# Patient Record
Sex: Female | Born: 1980 | Race: Black or African American | Hispanic: No | Smoking: Never smoker
Health system: Southern US, Community
[De-identification: ages and names within clinical notes are randomized; demographics above are authoritative.]

## PROBLEM LIST (undated history)

## (undated) DIAGNOSIS — E109 Type 1 diabetes mellitus without complications: Secondary | ICD-10-CM

## (undated) DIAGNOSIS — E7800 Pure hypercholesterolemia, unspecified: Secondary | ICD-10-CM

## (undated) DIAGNOSIS — I82409 Acute embolism and thrombosis of unspecified deep veins of unspecified lower extremity: Secondary | ICD-10-CM

## (undated) DIAGNOSIS — E039 Hypothyroidism, unspecified: Secondary | ICD-10-CM

## (undated) DIAGNOSIS — E0500 Thyrotoxicosis with diffuse goiter without thyrotoxic crisis or storm: Secondary | ICD-10-CM

## (undated) DIAGNOSIS — H547 Unspecified visual loss: Secondary | ICD-10-CM

## (undated) DIAGNOSIS — Z992 Dependence on renal dialysis: Secondary | ICD-10-CM

## (undated) DIAGNOSIS — E10319 Type 1 diabetes mellitus with unspecified diabetic retinopathy without macular edema: Secondary | ICD-10-CM

## (undated) DIAGNOSIS — N186 End stage renal disease: Secondary | ICD-10-CM

## (undated) DIAGNOSIS — I10 Essential (primary) hypertension: Secondary | ICD-10-CM

## (undated) DIAGNOSIS — R011 Cardiac murmur, unspecified: Secondary | ICD-10-CM

## (undated) HISTORY — PX: ENUCLEATION: SHX628

---

## 2002-10-30 ENCOUNTER — Inpatient Hospital Stay (HOSPITAL_COMMUNITY): Admission: EM | Admit: 2002-10-30 | Discharge: 2002-11-01 | Payer: Self-pay | Admitting: *Deleted

## 2002-10-30 ENCOUNTER — Encounter: Payer: Self-pay | Admitting: Internal Medicine

## 2003-05-22 ENCOUNTER — Emergency Department (HOSPITAL_COMMUNITY): Admission: EM | Admit: 2003-05-22 | Discharge: 2003-05-22 | Payer: Self-pay | Admitting: Emergency Medicine

## 2003-06-18 ENCOUNTER — Encounter: Payer: Self-pay | Admitting: Emergency Medicine

## 2003-06-18 ENCOUNTER — Emergency Department (HOSPITAL_COMMUNITY): Admission: AD | Admit: 2003-06-18 | Discharge: 2003-06-18 | Payer: Self-pay | Admitting: Emergency Medicine

## 2003-07-07 ENCOUNTER — Inpatient Hospital Stay (HOSPITAL_COMMUNITY): Admission: EM | Admit: 2003-07-07 | Discharge: 2003-07-08 | Payer: Self-pay | Admitting: Emergency Medicine

## 2003-08-04 ENCOUNTER — Inpatient Hospital Stay (HOSPITAL_COMMUNITY): Admission: EM | Admit: 2003-08-04 | Discharge: 2003-08-06 | Payer: Self-pay | Admitting: Emergency Medicine

## 2004-04-09 ENCOUNTER — Inpatient Hospital Stay (HOSPITAL_COMMUNITY): Admission: EM | Admit: 2004-04-09 | Discharge: 2004-04-11 | Payer: Self-pay | Admitting: Emergency Medicine

## 2006-01-27 ENCOUNTER — Inpatient Hospital Stay (HOSPITAL_COMMUNITY): Admission: EM | Admit: 2006-01-27 | Discharge: 2006-01-30 | Payer: Self-pay | Admitting: Emergency Medicine

## 2007-10-07 HISTORY — PX: CATARACT EXTRACTION W/ INTRAOCULAR LENS IMPLANT: SHX1309

## 2007-10-07 HISTORY — PX: EYE SURGERY: SHX253

## 2010-08-19 ENCOUNTER — Encounter (HOSPITAL_COMMUNITY)
Admission: RE | Admit: 2010-08-19 | Discharge: 2010-11-05 | Payer: Self-pay | Source: Home / Self Care | Attending: Internal Medicine | Admitting: Internal Medicine

## 2010-12-17 LAB — HCG, SERUM, QUALITATIVE: Preg, Serum: NEGATIVE

## 2011-02-21 NOTE — Discharge Summary (Signed)
[name], [name]               ACCOUNT NO.:  [id number]   MEDICAL RECORD NO.:  [id number]          PATIENT TYPE:  INP   LOCATION:  6707                         FACILITY:  [facility]   PHYSICIAN:  Lonia Blood, M.D.      DATE OF BIRTH:  1981/07/21   DATE OF ADMISSION:  01/26/2006  DATE OF DISCHARGE:  01/30/2006                                 DISCHARGE SUMMARY   DISCHARGE DIAGNOSES:  1.  Diabetic ketoacidosis.  2.  Nausea and vomiting.  3.  Constipation.  4.  Hypokalemia.   DISCHARGE MEDICATIONS:  1.  NPH insulin 15 units subcutaneously at breakfast, dinner and at bedtime.  2.  Sliding scale insulin using regular insulin at mealtimes.   DISPOSITION:  The patient is being discharged.  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  She has no local doctor, but would be going home  next week where she will resume care with her primary care doctor.  The  patient is aware of the need for her to check her sugars, blood sugar at  mealtimes.  She will take her NPH insulin as well as sliding scale until she  sees her doctor.  She has been instructed to call our office if her sugars  are persistently more than 200 by this regimen.   CONSULTATIONS:  None.   BRIEF HISTORY AND PHYSICAL:  Please refer to the dictated History and  Physical by Dr. Garnette Czech.  The patient is a 30 year old female, well known to  our service with recurrent admission with DKA.  On the day of admission, she  came in with nausea and vomiting.  She also has some constipation.  The  patient has been diabetic since 1996.  As indicated, she was being followed  at [facility], but being a student, she has been here all the while.  Her  blood sugar was 16109 here.  She was acidotic.  Her bicarbonate was also low  with a pH of 7.01.  She was subsequently admitted for management of her  diabetic ketoacidosis.   HOSPITAL COURSE:  #1 - DIABETIC KETOACIDOSIS.  The patient was admitted and  started on IV insulin.  She  had initial hyperkalemia which also resulted in  hypokalemia.  Potassium was repleted as well.  Her B-met was checked q.2 h  and CBG q.1 h until her gap closed.  Her initial gap on admission was 16,  but closed within the first 12 hours.  Since then, she has been converted to  subcutaneous insulin, initially Lantus, but not transitioned back to her  home medications.  Her CBG's are still high despite being on home  medication, indicating that that was not controlling her sugar adequately.  Hence, we made adjustments increasing the dose of the NPH to 15 units from  12 units.  She will use a sliding scale as well.  She needs to reestablish  contact and care with her primary care physician for further management.   #2 - HYPOKALEMIA.  This is most likely secondary to the insulin.  Also, the  patient had  nausea and vomiting which may have contributed.  Her potassium  repleted prior to her discharge.  __________, however, seemed to be stable  at this point, as the patient is healthy otherwise.  Her nausea and vomiting  has also resolved.      Lonia Blood, M.D.  Electronically Signed     LG/MEDQ  D:  01/30/2006  T:  01/30/2006  Job:  [id number]

## 2011-02-21 NOTE — Discharge Summary (Signed)
NAMEMarland [name]  GULIANNA, [name]                         ACCOUNT NO.:  [id number]   MEDICAL RECORD NO.:  [id number]                   PATIENT TYPE:  INP   LOCATION:  3305                                 FACILITY:  [facility]   PHYSICIAN:  Catalina Pizza, M.D.                     DATE OF BIRTH:  [birth year]   DATE OF ADMISSION:  10/30/2002  DATE OF DISCHARGE:  11/01/2002                                 DISCHARGE SUMMARY   DIFFERENTIAL DIAGNOSIS:  Diabetic ketoacidosis.   DISCHARGE MEDICATIONS:  1. Lantus 45 units q.h.s.  2. Regular insulin on sliding scale each meal as previously done.  3. Record and check blood sugar levels at each meal.   HISTORY OF PRESENT ILLNESS:  The patient is a 30 year old African American  female with insulin dependent diabetes who was in town for the weekend and  forgot her insulin at home.  The patient reports last dose of insulin two  days prior to the emergency room visit.  The patient tolerated eating  yesterday and she had one episode of nausea and vomiting today and  complained of feeling short of breath.  Last insulin was on January 23 in  the evening, had a normal appetite.  No nausea, vomiting the day prior to  admission.  Denies having any recent illnesses, no dysuria, no fever, no  chills.   ALLERGIES:  No known drug allergies.   PAST MEDICAL HISTORY:  Insulin dependent diabetes diagnosed in 1996, last  admitted to hospital for DKA four years ago.  Prior to that multiple visits  for DKA with blood sugars in the 400 to 800 range.   MEDICATIONS:  Medications prior to admission:  1. Lantus 45 units q.h.s.  2. Lispro sliding scale approximately 12 units each meal.   HABITS:  Does not smoke, no alcohol, no cocaine or IV drug use.   SOCIAL HISTORY:  She is a single, [place] American female who commutes to  [employer].  [place].  Lives with her mother and stepfather.   PHYSICAL EXAMINATION:  On admission vital  signs temperature 98.4, pulse 109,  blood pressure 140/72, respirations 20, 97% on room air.  Physical exam was  benign.   LABORATORY DATA:  Laboratory obtained during admission:  sodium 130,  potassium 5.4, chloride 108, bicarbonate 9, BUN 20, her glucose of 428.  Obtained ABG initially which was 7.156, PT 24.7 then repeated which showed a  pH of 7.07, a PCO2 of 16.9 and a PO2 of 138.0 on room air.  Initial white  blood cell count was 20.5, hemoglobin was 14.5, hematocrit 42.8, MCV 88.5  and platelets 300.  AST of 28,  ALT of 30, alkaline phosphatase 130, T-bili  1.5, magnesium 1.9, phosphorous 4.6, lactic acid was 1.7, serum osmols were  306, had a very small amount of acetone in blood.  Hemoglobin  A1c was 12.9  and TSH of 0.915.  Urine pregnancy was negative.  Acetaminophen salicylates  were both negative.  Drug screen was negative.  A urinalysis was negative,  nitrite negative, leukocyte esterase rare bacteria, greater than 80 ketones  and greater than 1,000 glucose.  A chest x-ray showed no acute disease.   HOSPITAL COURSE:  DIABETIC KETOACIDOSIS.  During hospitalization patient was  initially stabilized with large amounts of fluid, admitted to step down unit  at which time her sodium and bicarbonate collected quickly with continued  monitoring of her potassium which remained stable.  She was initially  started on insulin drip and then switched to sliding scale once the anion  gap had closed.  She was then switched from a sliding scale, given a low  dose of NPH and then switched to Lantus evening dose as she was at home.  She did have some mild hypokalemia which was repleted with two doses of K-  Dur.  The patient apparently was visiting friends in [place] and had  forgotten her insulin and since she has not had any problems with DKA in the  recent past she did not worry with it.  Questioned on some noncompliance  with her medicines and minimal follow up given her hemoglobin A1c  of 12.9.  Talked extensively with the patient about need for continuing with her  insulin as well as need to follow up routinely every three months for her  blood sugars and further medical treatment needed.   DISPOSITION:  She is to avoid sugar altogether, return immediately to the  emergency department if she has any further nausea or vomiting or call the  outpatient clinic at [phone].  She will need a follow up at [facility] on February 5 at 3:45 in the [facility] clinic.  Emphasized to  the patient the need for close follow up and continued monitoring of her  blood sugars.  Patient agreed to the above appointment and was aware of the  need for close monitoring of her diabetes given the fact that she is a very  brittle diabetic.                                                  Catalina Pizza, M.D.    ZH/MEDQ  D:  12/14/2002  T:  12/15/2002  Job:  [id number]

## 2011-02-21 NOTE — Discharge Summary (Signed)
NAMEMarland [name]  SHABREE, TEBBETTS                         ACCOUNT NO.:  [id number]   MEDICAL RECORD NO.:  [id number]                   PATIENT TYPE:  INP   LOCATION:  3308                                 FACILITY:  [facility]   PHYSICIAN:  Lawerance Sabal, MD                   DATE OF BIRTH:  [birth year]   DATE OF ADMISSION:  07/06/2003  DATE OF DISCHARGE:  07/08/2003                                 DISCHARGE SUMMARY   DISCHARGE DIAGNOSES:  1. Diabetic ketoacidosis, resolved.  2. Diabetes mellitus, type 1.  3. Hypokalemia, resolved.   DISCHARGE MEDICATIONS:  1. Lantus 40 units at night.  2. Lispro sliding scale with each meal.   SPECIAL INSTRUCTIONS:  Record all blood sugars at each meal and record  Lispro given at each meal.  Bring recorded values with you to follow-up  appointment.   FOLLOW UP:  Follow up in two weeks at outpatient clinic.  Follow up with  nutritionist.   HISTORY OF PRESENT ILLNESS:  This is a 30 year old, African-American female  with known type 1 diabetes mellitus with a two-day history of malaise,  weakness, nausea and headache.  The patient is on a rapid insulin  administration at home and she has not been checking her sugars regularly.  Present insulin regimen Lantus 40 units h.s. and Lispro 10 minutes before  meals.   ALLERGIES:  No known drug allergies.   PAST MEDICAL HISTORY:  1. Type 1 diabetes mellitus diagnosed in 1996.  2. Previous DKA episodes with last admission in January 2004.   FAMILY HISTORY:  Unremarkable.   SOCIAL HISTORY:  The patient is a Consulting civil engineer at [employer]  She is single  and lives with mom and step-father.   REVIEW OF SYMPTOMS:  MUSCULOSKELETAL:  Positive for fatigue, joint pain,  muscle and back pain.  CARDIOPULMONARY:  Positive dyspnea.   PHYSICAL EXAMINATION:  GENERAL:  Awake, not in distress.  VITAL SIGNS:  Blood pressure 115-118/55-65, heart rate 78-95, respirations  15-20, temperature 97.6.  HEENT:  Pink conjunctivae.   Anicteric sclerae.  PERRLA.  No tonsillar  oropharyngeal congestion.  No cervical lymphadenopathy.  CARDIAC:  Regular rate and rhythm, no murmurs.  LUNGS:  Clear to auscultation bilaterally.  No rales.  No wheezing.  ABDOMEN:  Normal bowel sounds.  No tenderness and no masses.  EXTREMITIES:  Warm.  No lesions.  NEUROLOGIC:  Cranial nerves 2-12 grossly intact.  Motor 5/5.  Sensory  intact.   LABORATORY DATA AND X-RAY FINDINGS:  Hemoglobin 16.8, WBC 11.2, platelet  count 375.  Sodium 129, potassium 6.9, chloride 110, CO2 5.0, BUN 14,  creatinine 0.4, glucose 439.  ABG with pH 7.14, pCO2 14.9, bicarb 5.0.  Repeat ABG showed pH 7.209, pCO2 31.4, pO2 116, bicarb 13.  Blood culture  negative.  Urine culture negative.   ADMISSION DIAGNOSES:  1. Diabetic ketoacidosis.  2. Diabetes mellitus, type 1.  3. Hypokalemia.   HOSPITAL COURSE:  The patient was started on insulin drip at six units per  hour.  CBG monitoring was done every one hour.  Two ampules of bicarb was  also given.  Correction of the hypokalemia was done.  On hospital day #2,  insulin drip was discontinued.  The patient was started on NPH 15 units once  a day and Lantus 40 per unit at bedtime.  The patient was observed for two  days with note of good control of the blood sugars.   CONDITION ON DISCHARGE:  The patient was discharged in stable condition and  will follow up after two weeks at the outpatient clinic.                                                     Lawerance Sabal, MD    MC/MEDQ  D:  07/12/2003  T:  07/13/2003  Job:  [id number]

## 2011-02-21 NOTE — Discharge Summary (Signed)
NAMEMarland Kitchen  [name], [name]                         ACCOUNT NO.:  [id number]   MEDICAL RECORD NO.:  [id number]                   PATIENT TYPE:  INP   LOCATION:  5725                                 FACILITY:  [facility]   PHYSICIAN:  Alvester Morin, M.D.               DATE OF BIRTH:  07/06/1981   DATE OF ADMISSION:  04/09/2004  DATE OF DISCHARGE:  04/11/2004                                 DISCHARGE SUMMARY   DISCHARGE DIAGNOSES:  1. Diabetic ketoacidosis, resolved.  2. Hypokalemia, resolved.  3. Diabetes mellitus type 1.   DISCHARGE MEDICATIONS:  1. Lantus 30 units at bedtime.  2. Insulin Novolog sliding scale, insulin-moderate sensitive, with each meal     three times a day.   CONDITION ON DISCHARGE:  The patient was discharged in excellent condition.  At time of discharge she was able to ambulate without problems, to feed  herself without any problems, and she had no nausea, no vomiting, her CBGs  were under well control, and her DKA was completely resolved.  She was  instructed to follow up with [facility] on April 30, 2004,  at 2 p.m. with Lonia Blood, M.D.   No procedures, no consultations were obtained or done during this admission.   BRIEF ADMITTING HISTORY AND PHYSICAL WITH ADMISSION LABS:  [name] is a  30 year old African-American woman with type 1 diabetes mellitus, who  presented to [facility] emergency room on April 09, 2004, for lethargy,  general malaise, nausea and vomiting.  Her CBGs have been running high for  the past days, and she has not been using her sliding scale insulin.  On  admission she had a benign neurologic exam, a regular heart and clear lungs,  but she had some mild abdominal tenderness, more so on the right upper  quadrant.   On admission her bicarbonate was 8, her sodium was 131, her glucose was 436.  Her total bilirubin was 1.9, alkaline phosphatase 129.  Also, on admission  she had small acetone in the blood and she had white  blood cell count of  12.4 thousand.   HOSPITAL COURSE:  Problem 1.  DIABETIC KETOACIDOSIS:  This is one of the numerous admissions  for [name] for diabetic ketoacidosis.  Initial differential diagnosis in  terms of the cause for diabetic ketoacidosis was noncompliance versus the  possibility of gallbladder process.  The patient was started on aggressive  intravenous fluid hydration with normal saline, then switched to dextrose  and half normal saline.  She was placed on an intravenous insulin drip,  initially at 5 units an hour and then decreased to 4 and 3 units an hour.  Sixteen hours into admission her anion gap closed and she was transitioned  to subcutaneous insulin as she was started on a diabetic diet.  At the time  of discharge her bicarbonate was 22 and her anion gap was 3.  Problem 2.  DIABETES MELLITUS TYPE 1:  While in the hospital there were  numerous discussions with [name] about her optimal regimen.  She  discussed with the diabetic educator the options, and she was going to  pursue Lantus 30 units at bedtime together with mealtime coverage three  times a day for an adequate rapid-acting insulin intake.  At the same time  she had a long discussion with the case manager in terms of how to buy the  supplies that she needs.  For long-term follow-up, she is going to follow up  at [facility] for her  diabetes and continuous check on hemoglobin A1C every six months will be  provided there.  Last set of blood work:  Sodium 141, potassium 3.9,  chloride 116, bicarbonate 22, glucose 108, BUN 7, creatinine 0.7, calcium  8.8.  Total bilirubin 0.7, alkaline phosphatase of 78.  The abdominal  ultrasound was within normal limits.      Lonia Blood, M.D.                          Alvester Morin, M.D.    SL/MEDQ  D:  04/11/2004  T:  04/12/2004  Job:  [id number]   cc:   [facility]

## 2011-02-21 NOTE — H&P (Signed)
NAMEMarland [name]  CHARLY, HUNTON                         ACCOUNT NO.:  [id number]   MEDICAL RECORD NO.:  [id number]                   PATIENT TYPE:  INP   LOCATION:  2104                                 FACILITY:  [facility]   PHYSICIAN:  Melissa L. Ladona Ridgel, MD               DATE OF BIRTH:  12-30-1980   DATE OF ADMISSION:  08/04/2003  DATE OF DISCHARGE:                                HISTORY & PHYSICAL   CHIEF COMPLAINT:  Nausea and vomiting.   HISTORY OF PRESENT ILLNESS:  This is a 30 year old African American female  with insulin-dependent diabetes, who states that she developed nausea and  vomiting after eating pork in the cafeteria at her school.  She denies any  diarrhea but states that she had multiple episodes of nausea and vomiting  and was unable to keep anything down.  This morning, she states that she  felt febrile and came into the emergency room because she thought she was  running into trouble with DKA.   PAST MEDICAL HISTORY:  Past medical history is significant for insulin-  dependent diabetes mellitus.  Her last DKA episode was at the beginning of  the month; she states it was secondary to stress.   PAST SURGICAL HISTORY:  None.   SOCIAL HISTORY:  She denies tobacco and ethanol use.   FAMILY HISTORY:  Noncontributory.   ALLERGIES:  She has no known drug allergies.   MEDICATIONS AT HOME:  Lantus 42 units nightly q.h.s sliding-scale insulin  with meals.   PHYSICAL EXAMINATION:  VITAL SIGNS:  In the emergency room, her vital signs  are 96.9 for a temperature, blood pressure 155/56, pulse is 106, respiratory  rate is 18 and 100% on room air.  GENERAL:  Generally, she is sleepy but arousable.  HEENT:  Her pupils are equal, round and reactive to light.  Her extraocular  movements are intact.  Her mucous membranes are dry.  NECK:  Her neck is supple and there is no JVD, no bruits, no thyromegaly.  CHEST:  Chest is clear to auscultation.  There are no rhonchi, rales or  wheezes.  CARDIOVASCULAR:  She is tachycardic with positive S1 and S2, no S3 or S4.  There is a 1-2/6 systolic ejection murmur at the left sternal border.  ABDOMEN:  Her abdomen is soft, nontender and nondistended with positive  bowel sounds.  There is no rebound or guarding.  EXTREMITIES:  Her extremities reveal a left groin triple-lumen catheter, 2+  pulses in all extremities, no rashes, no edema.  NEUROLOGIC:  Cranial nerves II-XII are intact.  Power is 5/5.  Her deep  tendon reflexes are 2+.  Her plantars are downgoing.   LABORATORY DATA:  Her labs in the emergency room revealed a white count of  21.9 with a hemoglobin of 18 and hematocrit of 54.  Her platelets are  439,000.  Her first BMET showed a sodium of 126,  potassium 6.3, chloride of  109, bicarb of 7, BUN of 27, creatinine of 0.6 and glucose of 634.  Repeat  BMET reveals improvement in her potassium, status post IV fluids and  insulin, at 5.2, BUN of 22 and a creatinine of 1.5.  Initially, she had an  ABG which revealed a pH of 7.07, PCO2 of 10.5, her PO2 was 125 and her  bicarb was 3.  After IV hydration and insulin, her pH was slightly improved  to 7.127 with a PCO2 of 16.5, PO2 of 160 and a bicarb of 5.  Her urine  pregnancy is negative; her UA is also negative.   EKG showed normal sinus rhythm with peaked T waves, no ST-T wave changes.   There was no x-ray completed in the emergency room.   ASSESSMENT AND PLAN:  This is a 30 year old African American female in  diabetic ketoacidosis possibly secondary to food intake of questionable  nature at her college.  Aspiration pneumonia is also a possibility secondary  to nausea and vomiting to explain the white count.   1. Diabetic ketoacidosis:  The patient has currently completed her third     liter of normal saline at 500 mL per hour and her fourth liter has been     hung.  Repeat ABG shows mild improvement, however, her bicarb remains     critical.  At this time, we will  add an ampule of bicarbonate to her     current intravenous fluids, decrease her rate to 250 mL per hour.  We     will continue with the Glucommander and we will add D-5 normal saline     when the fasting blood sugar is less than 250.  If the gap remains     closed, she will be started on long-acting insulin and she will be     permitted to eat; however, at this time, she appears far from that point.  2. Increased white blood cells with report of food indiscretion:  I would     like to start Cipro 400 mg IV q.12 h. to cover abdominal source, however,     the more likely explanation for her diabetic ketoacidosis is gastritis     with nausea and vomiting.  We will go ahead and check a chest x-ray     before sending her to the floor to ascertain whether there are any     elements of aspiration related to her nausea and vomiting.  3. Gap metabolic acidosis:  We will be adding one ampule of bicarbonate to     the current intravenous fluids times one liter.  Her potassium will be     repleted when it reaches less than 5.0 and a repeat EKG will be obtained.  4. For deep venous thrombosis prophylaxis at this time, we will use     pneumatic antiembolic stockings/hose and gastrointestinal prophylaxis     will be held off on at this point in this young woman.  Phenergan as     p.r.n. can be used for nausea and vomiting.                                                Melissa L. Ladona Ridgel, MD    MLT/MEDQ  D:  08/04/2003  T:  08/04/2003  Job:  [id number]

## 2011-02-21 NOTE — H&P (Signed)
[name], [name]               ACCOUNT NO.:  [id number]   MEDICAL RECORD NO.:  [id number]          PATIENT TYPE:  EMS   LOCATION:  [facility]                         FACILITY:  [facility]   PHYSICIAN:  Hettie Holstein, D.O.    DATE OF BIRTH:  1981/06/17   DATE OF ADMISSION:  01/26/2006  DATE OF DISCHARGE:                                HISTORY & PHYSICAL   PRIMARY CARE PHYSICIAN:  Unassigned.   CHIEF COMPLAINT:  Elevated CBG.   HISTORY OF PRESENT ILLNESS:  [name] is a 30 year old pleasant female  with known history of type 1 diabetes diagnosed in 51. She had been in her  usual state of health up until the past few days where she has experienced  nausea, vomiting and had some complaints of constipation and reported some  elevated high blood sugars. She states that she had been taking her insulin.  She takes NPH three times daily and uses insulin sliding scale. In any event  in the emergency department, she is found to be acidotic with a low  bicarbonate and blood sugar greater than 700 and tachypneic, though  hemodynamically stable. She is being admitted for diabetic ketoacidosis.   PAST MEDICAL HISTORY:  As noted above. Significant for insulin-requiring  diabetes. She had formerly seen [facility], though has not  maintained continuity with a [facility]ards for quite some time.  She denies any previous surgical procedures.   MEDICATIONS:  1.  She takes NPH 12 units t.i.d. in the a.m., at 4:30 p.m. and at 11:30      p.m.  2.  NovoLog sliding scale.  3.  She has been formerly on Lantus which she stated before she found      herself giving herself more insulin than usual since that time.   ALLERGIES:  She has no known drug allergies.   SOCIAL HISTORY:  She is a Teacher, music at [employer] She  has no children. She is not married. She denies illicit drug use. She is  homosexual. Her parent's phone number is [phone].   FAMILY HISTORY:   Noncontributory.   REVIEW OF SYSTEMS:  Dark stools, also complains of constipation. Had some  blood-tinged emesis after four or five episodes of vomiting today.  Otherwise, she has no other complaints.   PHYSICAL EXAMINATION:  She is tachycardic in the department, though this is  improved with one liter of IV fluid. She continues to be tachypneic. Neck is  supple and nontender. Cardiovascular exam reveals normal S1 and S2. Lungs  are clear. Abdomen is minimally tender. Extremities reveal no edema.   LABORATORY DATA:  Only I-stat was performed in the emergency department  which revealed a creatinine of 0.9 and a sodium of 128, potassium 5.2  (corrected sodium was 136), BUN 21. Her hemoglobin was 17.0. Her pH was  7.01, pCO2 27. We will obtain further labs at this time.   ASSESSMENT:  1.  Diabetic ketoacidosis.  2.  Nausea and vomiting.  3.  Constipation.  4.  History of dark stools, possible gastrointestinal bleed.   PLAN:  We  are going to admit [name] to a stepdown ICU. Administer  generous IV fluids and IV insulin therapy as well as glucose until anion gap  closure. She will be placed on Glucomander until this occurs. We will follow  clinical course and resume long-acting insulin in the a.m. and discontinue  d5 subsequently thereafter. We will hemoccult and Gastroccult her stools and  emesis and follow her clinical course.      Hettie Holstein, D.O.  Electronically Signed     ESS/MEDQ  D:  01/27/2006  T:  01/27/2006  Job:  [id number]

## 2013-03-30 ENCOUNTER — Inpatient Hospital Stay (HOSPITAL_COMMUNITY): Payer: BC Managed Care – PPO

## 2013-03-30 ENCOUNTER — Emergency Department (HOSPITAL_COMMUNITY): Payer: BC Managed Care – PPO

## 2013-03-30 ENCOUNTER — Inpatient Hospital Stay (HOSPITAL_COMMUNITY)
Admission: EM | Admit: 2013-03-30 | Discharge: 2013-04-09 | DRG: 315 | Disposition: A | Discharge status: Home / Self Care | Payer: BC Managed Care – PPO | Attending: Family Medicine | Admitting: Family Medicine

## 2013-03-30 ENCOUNTER — Encounter (HOSPITAL_COMMUNITY): Payer: Self-pay | Admitting: Emergency Medicine

## 2013-03-30 DIAGNOSIS — Z9001 Acquired absence of eye: Secondary | ICD-10-CM | POA: Diagnosis not present

## 2013-03-30 DIAGNOSIS — E1039 Type 1 diabetes mellitus with other diabetic ophthalmic complication: Secondary | ICD-10-CM | POA: Diagnosis present

## 2013-03-30 DIAGNOSIS — E8779 Other fluid overload: Secondary | ICD-10-CM | POA: Diagnosis present

## 2013-03-30 DIAGNOSIS — E11319 Type 2 diabetes mellitus with unspecified diabetic retinopathy without macular edema: Secondary | ICD-10-CM | POA: Diagnosis present

## 2013-03-30 DIAGNOSIS — E1029 Type 1 diabetes mellitus with other diabetic kidney complication: Secondary | ICD-10-CM | POA: Diagnosis present

## 2013-03-30 DIAGNOSIS — E860 Dehydration: Secondary | ICD-10-CM | POA: Diagnosis present

## 2013-03-30 DIAGNOSIS — I82629 Acute embolism and thrombosis of deep veins of unspecified upper extremity: Secondary | ICD-10-CM | POA: Diagnosis not present

## 2013-03-30 DIAGNOSIS — I160 Hypertensive urgency: Secondary | ICD-10-CM

## 2013-03-30 DIAGNOSIS — E039 Hypothyroidism, unspecified: Secondary | ICD-10-CM | POA: Diagnosis present

## 2013-03-30 DIAGNOSIS — N2581 Secondary hyperparathyroidism of renal origin: Secondary | ICD-10-CM | POA: Diagnosis present

## 2013-03-30 DIAGNOSIS — E876 Hypokalemia: Secondary | ICD-10-CM | POA: Diagnosis present

## 2013-03-30 DIAGNOSIS — N186 End stage renal disease: Secondary | ICD-10-CM | POA: Diagnosis present

## 2013-03-30 DIAGNOSIS — K5900 Constipation, unspecified: Secondary | ICD-10-CM | POA: Diagnosis present

## 2013-03-30 DIAGNOSIS — D649 Anemia, unspecified: Secondary | ICD-10-CM | POA: Diagnosis present

## 2013-03-30 DIAGNOSIS — H9190 Unspecified hearing loss, unspecified ear: Secondary | ICD-10-CM | POA: Diagnosis present

## 2013-03-30 DIAGNOSIS — H540X55 Blindness right eye category 5, blindness left eye category 5: Secondary | ICD-10-CM | POA: Diagnosis present

## 2013-03-30 DIAGNOSIS — I120 Hypertensive chronic kidney disease with stage 5 chronic kidney disease or end stage renal disease: Secondary | ICD-10-CM | POA: Diagnosis present

## 2013-03-30 DIAGNOSIS — N19 Unspecified kidney failure: Secondary | ICD-10-CM

## 2013-03-30 DIAGNOSIS — E872 Acidosis, unspecified: Secondary | ICD-10-CM | POA: Diagnosis present

## 2013-03-30 DIAGNOSIS — I10 Essential (primary) hypertension: Secondary | ICD-10-CM

## 2013-03-30 DIAGNOSIS — I82623 Acute embolism and thrombosis of deep veins of upper extremity, bilateral: Secondary | ICD-10-CM

## 2013-03-30 DIAGNOSIS — H547 Unspecified visual loss: Secondary | ICD-10-CM

## 2013-03-30 DIAGNOSIS — E109 Type 1 diabetes mellitus without complications: Secondary | ICD-10-CM | POA: Diagnosis present

## 2013-03-30 HISTORY — DX: Unspecified visual loss: H54.7

## 2013-03-30 HISTORY — DX: Hypothyroidism, unspecified: E03.9

## 2013-03-30 LAB — GLUCOSE, CAPILLARY
Glucose-Capillary: 201 mg/dL — ABNORMAL HIGH (ref 70–99)
Glucose-Capillary: 291 mg/dL — ABNORMAL HIGH (ref 70–99)

## 2013-03-30 LAB — URINALYSIS, ROUTINE W REFLEX MICROSCOPIC
Bilirubin Urine: NEGATIVE
Glucose, UA: 250 mg/dL — AB
Ketones, ur: 15 mg/dL — AB
Leukocytes, UA: NEGATIVE
Nitrite: NEGATIVE
Protein, ur: >300 mg/dL — AB
Specific Gravity, Urine: 1.017 (ref 1.005–1.030)
Urobilinogen, UA: 0.2 mg/dL (ref 0.0–1.0)
pH: 6.5 (ref 5.0–8.0)

## 2013-03-30 LAB — COMPREHENSIVE METABOLIC PANEL
ALT: 20 U/L (ref 0–35)
AST: 20 U/L (ref 0–37)
Albumin: 1.8 g/dL — ABNORMAL LOW (ref 3.5–5.2)
Alkaline Phosphatase: 61 U/L (ref 39–117)
BUN: 53 mg/dL — ABNORMAL HIGH (ref 6–23)
CO2: 16 mEq/L — ABNORMAL LOW (ref 19–32)
Calcium: 8.9 mg/dL (ref 8.4–10.5)
Chloride: 97 mEq/L (ref 96–112)
Creatinine, Ser: 6.87 mg/dL — ABNORMAL HIGH (ref 0.50–1.10)
GFR calc Af Amer: 9 mL/min — ABNORMAL LOW (ref >90)
GFR calc non Af Amer: 7 mL/min — ABNORMAL LOW (ref >90)
Glucose, Bld: 203 mg/dL — ABNORMAL HIGH (ref 70–99)
Potassium: 2.9 mEq/L — ABNORMAL LOW (ref 3.5–5.1)
Sodium: 130 mEq/L — ABNORMAL LOW (ref 135–145)
Total Bilirubin: 0.3 mg/dL (ref 0.3–1.2)
Total Protein: 6.4 g/dL (ref 6.0–8.3)

## 2013-03-30 LAB — POCT I-STAT 3, VENOUS BLOOD GAS (G3P V)
Acid-base deficit: 4 mmol/L — ABNORMAL HIGH (ref 0.0–2.0)
Bicarbonate: 23.4 mEq/L (ref 20.0–24.0)
O2 Saturation: 36 %
TCO2: 25 mmol/L (ref 0–100)
pCO2, Ven: 50.8 mmHg — ABNORMAL HIGH (ref 45.0–50.0)
pH, Ven: 7.272 (ref 7.250–7.300)
pO2, Ven: 24 mmHg — CL (ref 30.0–45.0)

## 2013-03-30 LAB — URINE MICROSCOPIC-ADD ON

## 2013-03-30 LAB — CBC WITH DIFFERENTIAL/PLATELET
Basophils Absolute: 0.1 10*3/uL (ref 0.0–0.1)
Basophils Relative: 1 % (ref 0–1)
Eosinophils Absolute: 0 10*3/uL (ref 0.0–0.7)
Eosinophils Relative: 0 % (ref 0–5)
HCT: 31.5 % — ABNORMAL LOW (ref 36.0–46.0)
Hemoglobin: 11.4 g/dL — ABNORMAL LOW (ref 12.0–15.0)
Lymphocytes Relative: 23 % (ref 12–46)
Lymphs Abs: 1.4 10*3/uL (ref 0.7–4.0)
MCH: 29.7 pg (ref 26.0–34.0)
MCHC: 36.2 g/dL — ABNORMAL HIGH (ref 30.0–36.0)
MCV: 82 fL (ref 78.0–100.0)
Monocytes Absolute: 0.2 10*3/uL (ref 0.1–1.0)
Monocytes Relative: 4 % (ref 3–12)
Neutro Abs: 4.4 10*3/uL (ref 1.7–7.7)
Neutrophils Relative %: 72 % (ref 43–77)
Platelets: 353 10*3/uL (ref 150–400)
RBC: 3.84 MIL/uL — ABNORMAL LOW (ref 3.87–5.11)
RDW: 12.3 % (ref 11.5–15.5)
WBC: 6 10*3/uL (ref 4.0–10.5)

## 2013-03-30 LAB — KETONES, QUALITATIVE: Acetone, Bld: NEGATIVE

## 2013-03-30 LAB — LIPASE, BLOOD: Lipase: 104 U/L — ABNORMAL HIGH (ref 11–59)

## 2013-03-30 LAB — PREGNANCY, URINE: Preg Test, Ur: NEGATIVE

## 2013-03-30 LAB — LACTIC ACID, PLASMA: Lactic Acid, Venous: 1.3 mmol/L (ref 0.5–2.2)

## 2013-03-30 MED ORDER — ONDANSETRON HCL 4 MG/2ML IJ SOLN
4.0000 mg | Freq: Three times a day (TID) | INTRAMUSCULAR | Status: DC | PRN
  Administered 2013-03-31: 4 mg via INTRAVENOUS
  Filled 2013-03-30: qty 2

## 2013-03-30 MED ORDER — POLYETHYLENE GLYCOL 3350 17 G PO PACK
17.0000 g | PACK | Freq: Two times a day (BID) | ORAL | Status: DC
  Administered 2013-03-30 – 2013-03-31 (×3): 17 g via ORAL
  Filled 2013-03-30 (×5): qty 1

## 2013-03-30 MED ORDER — SODIUM CHLORIDE 0.9 % IV SOLN: INTRAVENOUS | Status: DC

## 2013-03-30 MED ORDER — SODIUM CHLORIDE 0.9 % IV BOLUS (SEPSIS)
1000.0000 mL | Freq: Once | INTRAVENOUS | Status: AC
  Administered 2013-03-30: 1000 mL via INTRAVENOUS

## 2013-03-30 MED ORDER — INSULIN GLARGINE 100 UNIT/ML ~~LOC~~ SOLN
11.0000 [IU] | Freq: Every day | SUBCUTANEOUS | Status: DC
  Administered 2013-03-30 – 2013-04-02 (×4): 11 [IU] via SUBCUTANEOUS
  Filled 2013-03-30 (×5): qty 0.11

## 2013-03-30 MED ORDER — SODIUM CHLORIDE 0.9 % IJ SOLN
3.0000 mL | Freq: Two times a day (BID) | INTRAMUSCULAR | Status: DC
  Administered 2013-03-30 – 2013-04-08 (×16): 3 mL via INTRAVENOUS

## 2013-03-30 MED ORDER — POTASSIUM CHLORIDE CRYS ER 20 MEQ PO TBCR
40.0000 meq | EXTENDED_RELEASE_TABLET | Freq: Once | ORAL | Status: AC
  Administered 2013-03-30: 40 meq via ORAL
  Filled 2013-03-30: qty 2

## 2013-03-30 MED ORDER — ONDANSETRON HCL 4 MG/2ML IJ SOLN
4.0000 mg | Freq: Once | INTRAMUSCULAR | Status: AC
  Administered 2013-03-30: 4 mg via INTRAVENOUS
  Filled 2013-03-30: qty 2

## 2013-03-30 MED ORDER — INSULIN ASPART 100 UNIT/ML ~~LOC~~ SOLN
0.0000 [IU] | Freq: Three times a day (TID) | SUBCUTANEOUS | Status: DC
  Administered 2013-03-31 (×2): 5 [IU] via SUBCUTANEOUS
  Administered 2013-03-31: 3 [IU] via SUBCUTANEOUS
  Administered 2013-04-01: 8 [IU] via SUBCUTANEOUS
  Administered 2013-04-02: 5 [IU] via SUBCUTANEOUS
  Administered 2013-04-02: 2 [IU] via SUBCUTANEOUS
  Administered 2013-04-02: 8 [IU] via SUBCUTANEOUS
  Administered 2013-04-03: 5 [IU] via SUBCUTANEOUS
  Administered 2013-04-03: 11 [IU] via SUBCUTANEOUS
  Administered 2013-04-05: 3 [IU] via SUBCUTANEOUS
  Administered 2013-04-05: 8 [IU] via SUBCUTANEOUS
  Administered 2013-04-05: 3 [IU] via SUBCUTANEOUS
  Administered 2013-04-06: 15 [IU] via SUBCUTANEOUS
  Administered 2013-04-06: 11 [IU] via SUBCUTANEOUS
  Administered 2013-04-07: 2 [IU] via SUBCUTANEOUS
  Administered 2013-04-07: 5 [IU] via SUBCUTANEOUS
  Administered 2013-04-08 – 2013-04-09 (×2): 11 [IU] via SUBCUTANEOUS

## 2013-03-30 MED ORDER — ONDANSETRON HCL 4 MG/2ML IJ SOLN: 4.0000 mg | Freq: Three times a day (TID) | INTRAMUSCULAR | Status: DC | PRN

## 2013-03-30 MED ORDER — HEPARIN SODIUM (PORCINE) 5000 UNIT/ML IJ SOLN
5000.0000 [IU] | Freq: Three times a day (TID) | INTRAMUSCULAR | Status: DC
  Administered 2013-03-30 – 2013-04-02 (×7): 5000 [IU] via SUBCUTANEOUS
  Filled 2013-03-30 (×11): qty 1

## 2013-03-30 MED ORDER — STERILE WATER FOR INJECTION IV SOLN
INTRAVENOUS | Status: DC
  Administered 2013-03-30: 14:00:00 via INTRAVENOUS
  Filled 2013-03-30 (×2): qty 850

## 2013-03-30 MED ORDER — HYDRALAZINE HCL 20 MG/ML IJ SOLN
10.0000 mg | INTRAMUSCULAR | Status: DC | PRN
  Administered 2013-03-30 – 2013-03-31 (×2): 10 mg via INTRAVENOUS
  Filled 2013-03-30 (×2): qty 1

## 2013-03-30 NOTE — ED Provider Notes (Signed)
History    CSN: [id number] Arrival date & time 03/30/13  1037  First MD Initiated Contact with Patient 03/30/13 1130     Chief Complaint  Patient presents with  . Abnormal Lab  . Nausea   (Consider location/radiation/quality/duration/timing/severity/associated sxs/prior Treatment) HPI Comments: Patient presents with nausea and vomiting that started last night. She threw up one time this morning is nonbilious and nonbloody. She endorses constipation has not moved her bowels in 2 days. She was seen by her PCP last week for a cough and treated with antibiotics for URI. She states she was called and told that her kidney function was elevated. She states her sugars have been in the 200 range. She denies any fevers, chest pain or shortness of breath. She denies any abdominal pain. She has had some intermittent low back pain without dysuria or hematuria.  The history is provided by the patient.   Past Medical History  Diagnosis Date  . Diabetes mellitus without complication   . Blind    History reviewed. No pertinent past surgical history. History reviewed. No pertinent family history. History  Substance Use Topics  . Smoking status: Never Smoker   . Smokeless tobacco: Not on file  . Alcohol Use: No   OB History   Grav Para Term Preterm Abortions TAB SAB Ect Mult Living                 Review of Systems  Constitutional: Positive for activity change, appetite change and fatigue. Negative for fever.  HENT: Negative for congestion and rhinorrhea.   Respiratory: Positive for cough. Negative for chest tightness and shortness of breath.   Cardiovascular: Negative for chest pain.  Gastrointestinal: Positive for nausea, vomiting and constipation. Negative for abdominal pain.  Genitourinary: Negative for dysuria, hematuria, vaginal bleeding and vaginal discharge.  Musculoskeletal: Positive for back pain.  Skin: Negative for rash.  Neurological: Positive for weakness. Negative for  dizziness, light-headedness and headaches.   A complete 10 system review of systems was obtained and all systems are negative except as noted in the HPI and PMH.   Allergies  Review of patient's allergies indicates no known allergies.  Home Medications   Current Outpatient Rx  Name  Route  Sig  Dispense  Refill  . azithromycin (ZITHROMAX) 250 MG tablet   Oral   Take 250-500 mg by mouth See admin instructions. Take two tablet by mouth today, then take 1 tablet daily for 4 days         . benzonatate (TESSALON) 100 MG capsule   Oral   Take 100-200 mg by mouth every 8 (eight) hours as needed for cough.         . fish oil-omega-3 fatty acids 1000 MG capsule   Oral   Take 1 g by mouth daily.         . Garlic 500 MG CAPS   Oral   Take 1 capsule by mouth daily.         . insulin aspart (NOVOLOG) 100 UNIT/ML injection   Subcutaneous   Inject 5-10 Units into the skin See admin instructions. Titration of additional 1 unit per every 50 spike in blood sugar level max of 200         . insulin glargine (LANTUS) 100 UNIT/ML injection   Subcutaneous   Inject 25 Units into the skin 2 (two) times daily.         Marland Kitchen lisinopril-hydrochlorothiazide (PRINZIDE,ZESTORETIC) 10-12.5 MG per tablet   Oral  Take 1 tablet by mouth daily.         . Multiple Vitamin (MULTI-VITAMIN DAILY PO)   Oral   Take 1 tablet by mouth daily.          BP 179/105  Pulse 78  Temp(Src) 98.3 F (36.8 C) (Oral)  Resp 18  SpO2 100%  LMP 03/23/2013 Physical Exam  Constitutional: She is oriented to person, place, and time. She appears well-developed and well-nourished. No distress.  Appears tachypneic Which patient states is because she is nauseated denies shortness of breath  HENT:  Head: Normocephalic and atraumatic.  Mouth/Throat: No oropharyngeal exudate.  Dry mucous membranes  Eyes: Conjunctivae and EOM are normal. Pupils are equal, round, and reactive to light.  Neck: Normal range of motion.  Neck supple.  Cardiovascular: Normal rate, regular rhythm and normal heart sounds.   Pulmonary/Chest: Effort normal and breath sounds normal. No respiratory distress.  Abdominal: Soft. There is no tenderness. There is no rebound and no guarding.  Musculoskeletal: Normal range of motion. She exhibits tenderness. She exhibits no edema.  Paraspinal lumbar pain, no CVA tenderness  Neurological: She is alert and oriented to person, place, and time. No cranial nerve deficit. She exhibits normal muscle tone. Coordination normal.  Skin: Skin is warm.    ED Course  Procedures (including critical care time) Labs Reviewed  CBC WITH DIFFERENTIAL - Abnormal; Notable for the following:    RBC 3.84 (*)    Hemoglobin 11.4 (*)    HCT 31.5 (*)    MCHC 36.2 (*)    All other components within normal limits  COMPREHENSIVE METABOLIC PANEL - Abnormal; Notable for the following:    Sodium 130 (*)    Potassium 2.9 (*)    CO2 16 (*)    Glucose, Bld 203 (*)    BUN 53 (*)    Creatinine, Ser 6.87 (*)    Albumin 1.8 (*)    GFR calc non Af Amer 7 (*)    GFR calc Af Amer 9 (*)    All other components within normal limits  LIPASE, BLOOD - Abnormal; Notable for the following:    Lipase 104 (*)    All other components within normal limits  GLUCOSE, CAPILLARY - Abnormal; Notable for the following:    Glucose-Capillary 201 (*)    All other components within normal limits  POCT I-STAT 3, BLOOD GAS (G3P V) - Abnormal; Notable for the following:    pCO2, Ven 50.8 (*)    pO2, Ven 24.0 (*)    Acid-base deficit 4.0 (*)    All other components within normal limits  KETONES, QUALITATIVE  LACTIC ACID, PLASMA  PREGNANCY, URINE  URINALYSIS, ROUTINE W REFLEX MICROSCOPIC   Dg Abd Acute W/chest  03/30/2013   *RADIOLOGY REPORT*  Clinical Data: Hypertension, bronchitis, low back pressure with nausea and vomiting.  ACUTE ABDOMEN SERIES (ABDOMEN 2 VIEW & CHEST 1 VIEW)  Comparison: None.  Findings: Frontal view of the chest  shows midline trachea and normal heart size.  Lungs are clear.  No pleural fluid.  Two views of the abdomen show a fair amount of stool in the colon. No small bowel dilatation.  IMPRESSION: Bowel gas pattern suggests at least mild constipation.   Original Report Authenticated By: Leanna Battles, M.D.   1. Renal failure   2. Metabolic acidosis   3. Hypertensive urgency     MDM  Diabetic with nausea and vomiting and decreased appetite. Told by PCP kidney function was elevated.  No fevers. Recently treated for URI with some kind of antibiotic.  Creatinine 6.87, BUN 53, bicarbonate 16. Anion gap is 17. Metabolic acidosis like it is likely secondary to renal failure. No evidence of DKA. IV fluids continued. Will start bicarbonate drip. Lipase mildly elevated at 102. No significant abdominal pain.  Discussed with family practice who will admit for unassigned [facility]. Discussed with Dr. Lowell Guitar nephrology who will consult.   Glynn Octave, MD 03/30/13 2111

## 2013-03-30 NOTE — ED Notes (Signed)
Pt c/o abnormal lab with elevated kidney levels from PCP; pt sts some increased N/V; pt is blind

## 2013-03-30 NOTE — Consult Note (Signed)
I was asked by Dr. Manus Gunning to see [name] who is a 32 y.o. female juvenile diabetic of about two decades with a history of CKD with sCreat of 1.4mg /dl in January of 1610.  She presented to Dr. Concepcion Elk for her routine eval on 6/17 and found to have sCreat of 6.36mg /dl.  She does report some le edema and recent nausea.  She has a long history of hypertension.  She is blind.  Past Medical History  Diagnosis Date  . Diabetes mellitus without complication   . Blind    History reviewed. No pertinent past surgical history. Social History:  reports that she has never smoked. She does not have any smokeless tobacco history on file. She reports that she does not drink alcohol or use illicit drugs. Allergies: No Known Allergies History reviewed. No pertinent family history.  Medications Insulin, Lisinopril, MVI, Garlic pill, fish oil  ROS: blind for about 5 years  Blood pressure 193/101, pulse 76, temperature 98.3 F (36.8 C), temperature source Oral, resp. rate 18, last menstrual period 03/23/2013, SpO2 100.00%.  General appearance: alert and cooperative Head: Normocephalic, without obvious abnormality, atraumatic Eyes: blind Ears: normal TM's and external ear canals both ears Nose: Nares normal. Septum midline. Mucosa normal. No drainage or sinus tenderness. Throat: lips, mucosa, and tongue normal; teeth and gums normal and m Resp: clear to auscultation bilaterally Chest wall: no tenderness Cardio: regular rate and rhythm, S1, S2 normal, no murmur, click, rub or gallop GI: soft, non-tender; bowel sounds normal; no masses,  no organomegaly Extremities: edema 1-2+ Skin: Skin color, texture, turgor normal. No rashes or lesions Neurologic: Grossly normal Results for orders placed during the hospital encounter of 03/30/13 (from the past 48 hour(s))  CBC WITH DIFFERENTIAL     Status: Abnormal   Collection Time    03/30/13 12:05 PM      Result Value Range   WBC 6.0  4.0 - 10.5 K/uL   RBC 3.84 (*) 3.87 - 5.11 MIL/uL   Hemoglobin 11.4 (*) 12.0 - 15.0 g/dL   HCT 96.0 (*) 45.4 - 09.8 %   MCV 82.0  78.0 - 100.0 fL   MCH 29.7  26.0 - 34.0 pg   MCHC 36.2 (*) 30.0 - 36.0 g/dL   RDW 11.9  14.7 - 82.9 %   Platelets 353  150 - 400 K/uL   Neutrophils Relative % 72  43 - 77 %   Neutro Abs 4.4  1.7 - 7.7 K/uL   Lymphocytes Relative 23  12 - 46 %   Lymphs Abs 1.4  0.7 - 4.0 K/uL   Monocytes Relative 4  3 - 12 %   Monocytes Absolute 0.2  0.1 - 1.0 K/uL   Eosinophils Relative 0  0 - 5 %   Eosinophils Absolute 0.0  0.0 - 0.7 K/uL   Basophils Relative 1  0 - 1 %   Basophils Absolute 0.1  0.0 - 0.1 K/uL  COMPREHENSIVE METABOLIC PANEL     Status: Abnormal   Collection Time    03/30/13 12:05 PM      Result Value Range   Sodium 130 (*) 135 - 145 mEq/L   Potassium 2.9 (*) 3.5 - 5.1 mEq/L   Chloride 97  96 - 112 mEq/L   CO2 16 (*) 19 - 32 mEq/L   Glucose, Bld 203 (*) 70 - 99 mg/dL   BUN 53 (*) 6 - 23 mg/dL   Creatinine, Ser 5.62 (*) 0.50 - 1.10 mg/dL  Calcium 8.9  8.4 - 10.5 mg/dL   Total Protein 6.4  6.0 - 8.3 g/dL   Albumin 1.8 (*) 3.5 - 5.2 g/dL   AST 20  0 - 37 U/L   ALT 20  0 - 35 U/L   Alkaline Phosphatase 61  39 - 117 U/L   Total Bilirubin 0.3  0.3 - 1.2 mg/dL   GFR calc non Af Amer 7 (*) >90 mL/min   GFR calc Af Amer 9 (*) >90 mL/min   Comment:            The eGFR has been calculated     using the CKD EPI equation.     This calculation has not been     validated in all clinical     situations.     eGFR's persistently     <90 mL/min signify     possible Chronic Kidney Disease.  KETONES, QUALITATIVE     Status: None   Collection Time    03/30/13 12:10 PM      Result Value Range   Acetone, Bld NEGATIVE  NEGATIVE  LIPASE, BLOOD     Status: Abnormal   Collection Time    03/30/13 12:10 PM      Result Value Range   Lipase 104 (*) 11 - 59 U/L  LACTIC ACID, PLASMA     Status: None   Collection Time    03/30/13 12:10 PM      Result Value Range   Lactic Acid,  Venous 1.3  0.5 - 2.2 mmol/L  GLUCOSE, CAPILLARY     Status: Abnormal   Collection Time    03/30/13 12:18 PM      Result Value Range   Glucose-Capillary 201 (*) 70 - 99 mg/dL  POCT I-STAT 3, BLOOD GAS (G3P V)     Status: Abnormal   Collection Time    03/30/13 12:20 PM      Result Value Range   pH, Ven 7.272  7.250 - 7.300   pCO2, Ven 50.8 (*) 45.0 - 50.0 mmHg   pO2, Ven 24.0 (*) 30.0 - 45.0 mmHg   Bicarbonate 23.4  20.0 - 24.0 mEq/L   TCO2 25  0 - 100 mmol/L   O2 Saturation 36.0     Acid-base deficit 4.0 (*) 0.0 - 2.0 mmol/L   Sample type VENOUS     Comment NOTIFIED PHYSICIAN    URINALYSIS, ROUTINE W REFLEX MICROSCOPIC     Status: Abnormal   Collection Time    03/30/13  1:02 PM      Result Value Range   Color, Urine YELLOW  YELLOW   APPearance HAZY (*) CLEAR   Specific Gravity, Urine 1.017  1.005 - 1.030   pH 6.5  5.0 - 8.0   Glucose, UA 250 (*) NEGATIVE mg/dL   Hgb urine dipstick SMALL (*) NEGATIVE   Bilirubin Urine NEGATIVE  NEGATIVE   Ketones, ur 15 (*) NEGATIVE mg/dL   Protein, ur >161 (*) NEGATIVE mg/dL   Urobilinogen, UA 0.2  0.0 - 1.0 mg/dL   Nitrite NEGATIVE  NEGATIVE   Leukocytes, UA NEGATIVE  NEGATIVE  PREGNANCY, URINE     Status: None   Collection Time    03/30/13  1:02 PM      Result Value Range   Preg Test, Ur NEGATIVE  NEGATIVE   Comment:            THE SENSITIVITY OF THIS     METHODOLOGY IS >20 mIU/mL.  URINE  MICROSCOPIC-ADD ON     Status: Abnormal   Collection Time    03/30/13  1:02 PM      Result Value Range   Squamous Epithelial / LPF MANY (*) RARE   WBC, UA 0-2  <3 WBC/hpf   RBC / HPF 0-2  <3 RBC/hpf   Bacteria, UA FEW (*) RARE   Casts GRANULAR CAST (*) NEGATIVE   Urine-Other AMORPHOUS URATES/PHOSPHATES     Dg Abd Acute W/chest  03/30/2013   *RADIOLOGY REPORT*  Clinical Data: Hypertension, bronchitis, low back pressure with nausea and vomiting.  ACUTE ABDOMEN SERIES (ABDOMEN 2 VIEW & CHEST 1 VIEW)  Comparison: None.  Findings: Frontal view of  the chest shows midline trachea and normal heart size.  Lungs are clear.  No pleural fluid.  Two views of the abdomen show a fair amount of stool in the colon. No small bowel dilatation.  IMPRESSION: Bowel gas pattern suggests at least mild constipation.   Original Report Authenticated By: Leanna Battles, M.D.   Assessment:  1 Probable ESRD 2 Diabetes Mellitus w/ complications 3 Hypertension, uncontrolled 4 Volume overload 5 Hypokalemia  Rec: 1 Renal ultrasound 2 ANA, SPEP 3 BP control, no ACE or ARB 4 Diurese; stop all fluids 5 K replace 6 Will consider renal biopsy, pending ultrasound results and any other available data about kidney fct..  I suspect however that this is advanced kidney disease from DM.  Please obtain any additional historical labs from her primary MD.   Lauris Poag 03/30/2013, 4:33 PM

## 2013-03-30 NOTE — ED Notes (Signed)
Pt knows that urine is needed 

## 2013-03-30 NOTE — Progress Notes (Signed)
While discussing pt care with pt, pt states she does not have a blood sugar meter, and does not check her sugars at home. Pt states she "doesn't agree with current recommendations for DM management." as management is not "curative." Pt states she administers insulin based on how she "feels." Pt agreeable to CBG checks and insulin admin while in hospital. Will continue to monitor.

## 2013-03-30 NOTE — Progress Notes (Signed)
Pt arrived to unit via ED stretcher accompanied by friends. Pt is alert and oriented, endorses complete blindness (no light/shadow). Soft touch call bell available at pt bedside. Pt instructed on fall precautions. Pt has walking stick at bedside. Steady gait. MD at bedside to place orders. Will continue to monitor.

## 2013-03-30 NOTE — H&P (Signed)
Family Medicine Teaching [facility] Admission History and Physical Service Pager: [phone]  Patient name: [name] Medical record number: [id number] Date of birth: May 14, 1981 Age: 32 y.o. Gender: female  Primary Care Provider: No primary provider on file.  Chief Complaint: dehydration, n/v, elevated creatinine  Assessment and Plan: [name] is a 32 y.o. year old female with a hx of T1DM, HTN, HLD presenting with acute (possibly on chronic) renal failure with creatinine markedly above baseline.  # ARF: creatinine 6.87 today, was 6.36 on 6/17, 1.4 in January 2013. ?diabetic nephropathy vs hypertensive nephropathy vs some acute process.  - admit to telemetry - hydrate with NS @ 150 cc/hr --> will stop per renal recs - monitor fluid status closely with strict I&O, daily weights - consult nephrology for further recommendations - hold all nephrotoxic medications including home lisinopril [ ]  recheck BMET tonight and in AM  # N/V: - likely secondary to ARF - IV zofran prn  # Constipation: Patient reported and seen on abdominal series.  Per patient, did have hard stool after enema yesterday.  May be secondary to untreated hypothyroidism. - miralax BID until soft stools.  # DM: - continue home Lantus 12 units QAM and 11 units QHS - moderate sliding scale - A1c 8.3 at recent PCP visit  # HTN: - will give prn IV hydralazine for now while holding home ACE [ ]  f/u BP's  # HLD: - recently had lipids checked at PCP's office. Defer treatment of lipids to PCP.  # Hypothyroidism: - previous radionucleotide ablation from Graves Disease per EMR - TSH reportedly very elevated at previous visit, will repeat here and start synthroid if elevated [ ]  f/u TSH  # FEN/GI: - NS @ 125 cc/hr --> will stop per renals recs - clear liquid diet for now, advance as tolerated to carb modified  # Prophylaxis: - heparin SQ  # Dispo:  - pending clinical improvement and renal  consultation  # Code Status: discussed with patient, she desires to be full code   History of Present Illness: [name] is a 32 y.o. year old female presenting with vomiting and elevated creatinine from PCP's office.  Pt reports that last week she went to her PCP and was treated for bronchitis with azithromycin. At that visit she had blood work taken, including a CBC, CMP, TSH, and lipid panel. She was nauseated throughout the week and then two days ago her nausea got worse and she vomited. Her PCP called her and told her that her creatinine was high and asked her to return for repeat testing. She has continued to vomit and this morning felt very weak. She went to PCP's office to have labs redrawn but was told that she wouldn't necessarily be able to see the doctor due to a busy clinic, so she was advised to come to the ER.   Has felt extremely dehydrated and constipated. Used enema but still had hard stool. The constipation has gotten especially worse over the last few weeks. Cough was gone on Sunday and came back on Monday. No chest pain. Has had headache. Wondering if she had vertigo because she felt like her head was swimming. 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 Has had the feeling of needing to pop her ear.   Has a hx of T1DM. She takes Lantus 12 units QAM and 11 units QHS, with a sliding scale on top of the Lantus.  Does not check sugar at home, just goes by how she feels. She doses her novolog up to 10 units at a time, BID with meals, all based on how she feels. She does state that she has had more lows in the last month than is usual for her. Per PCP's office, most recent A1C was 8.3. 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 Has been blind for six years. She used to be on antihypertensive medicine and cholesterol medicine but was able to keep them controlled via diet so she hasn't  been on medicines recently. The last year has been socially difficult for pt due to family illness which she says is why her BP is higher now. Her PCP did start her on lisinopril at her recent visit as well because her BP was 200/120.  Per PCP's office via phone: June 17 Labs: had CBC, CMP, lipids, TSH drawn Creatinine was 6.36 at that time and TSH was 358.575. Last known creatinine prior to this month was 1.4 in January 2013   Review Of Systems: Per HPI. Otherwise 12 point review of systems was performed and was unremarkable.  Patient Active Problem List   Diagnosis Date Noted  . Blind    Past Medical History: Past Medical History  Diagnosis Date  . Diabetes mellitus without complication   . Blind   since 6 years this December due to diabetic retinopathy on one side, HTN on other eye  Past Surgical History: History reviewed. No pertinent past surgical history.  Home Medications: Lantus 12 in AM and 11 at night Novolog SSI Lisinopril MVI Fish oil Garlic pill   Social History: History  Substance Use Topics  . Smoking status: Never Smoker   . Smokeless tobacco: Not on file  . Alcohol Use: No  Works at [employer] in [employer] in [employer]. Lives with friends.  For any additional social history documentation, please refer to relevant sections of EMR.  Family History: Family History  Problem Relation Age of Onset  . Hypertension    . Cancer    . Thyroid disease     Allergies: No Known Allergies   Physical Exam: BP 193/101  Pulse 76  Temp(Src) 98.3 F (36.8 C) (Oral)  Resp 18  SpO2 100%  LMP 03/23/2013 Exam: General: NAD, pleasant and cooperative HEENT: NCAT. Pupils nonreactive to light. L pupil round, R pupil oval shaped. Tongue slightly dry. No palpable cervical lymphadenopathy. Cardiovascular: RRR. 2/6 systolic murmur loudest at LUSB. Respiratory: CTAB, NWOB Abdomen: soft, nontender to palpation. No palpable organomegaly. Extremities:  trace to 1+ pitting edema in bilateral ankles, no edema or erythema in calves Skin: no rashes noted Neuro: aside from nonreactive pupils, grossly nonfocal and speech intact. Ambulates easily to bathroom with assistance.  Labs and Imaging:  CBC:    Component Value Date/Time   WBC 6.0 03/30/2013 1205   HGB 11.4* 03/30/2013 1205   HCT 31.5* 03/30/2013 1205   PLT 353 03/30/2013 1205   MCV 82.0 03/30/2013 1205   NEUTROABS 4.4 03/30/2013 1205   LYMPHSABS 1.4 03/30/2013 1205   MONOABS 0.2 03/30/2013 1205   EOSABS 0.0 03/30/2013 1205   BASOSABS 0.1 03/30/2013 1205    Comprehensive Metabolic Panel:    Component Value Date/Time   NA 130* 03/30/2013 1205   K 2.9* 03/30/2013 1205   CL 97 03/30/2013 1205   CO2 16* 03/30/2013 1205   BUN 53* 03/30/2013 1205  CREATININE 6.87* 03/30/2013 1205   GLUCOSE 203* 03/30/2013 1205   CALCIUM 8.9 03/30/2013 1205   AST 20 03/30/2013 1205   ALT 20 03/30/2013 1205   ALKPHOS 61 03/30/2013 1205   BILITOT 0.3 03/30/2013 1205   PROT 6.4 03/30/2013 1205   ALBUMIN 1.8* 03/30/2013 1205    Urinalysis    Component Value Date/Time   COLORURINE YELLOW 03/30/2013 1302   APPEARANCEUR HAZY* 03/30/2013 1302   LABSPEC 1.017 03/30/2013 1302   PHURINE 6.5 03/30/2013 1302   GLUCOSEU 250* 03/30/2013 1302   HGBUR SMALL* 03/30/2013 1302   BILIRUBINUR NEGATIVE 03/30/2013 1302   KETONESUR 15* 03/30/2013 1302   PROTEINUR >300* 03/30/2013 1302   UROBILINOGEN 0.2 03/30/2013 1302   NITRITE NEGATIVE 03/30/2013 1302   LEUKOCYTESUR NEGATIVE 03/30/2013 1302   Urine pregnancy negative Qualitative ketones   Levert Feinstein, MD Family Medicine PGY-1 Service Pager [phone]  PGY-2 Addendum I have seen and examined this patient.  I agree with the above with my additions in pink.  BOOTH, Ranisha Allaire 03/30/2013,5:42 PM

## 2013-03-31 LAB — GLUCOSE, CAPILLARY: Glucose-Capillary: 233 mg/dL — ABNORMAL HIGH (ref 70–99)

## 2013-03-31 LAB — BASIC METABOLIC PANEL
BUN: 49 mg/dL — ABNORMAL HIGH (ref 6–23)
Chloride: 100 mEq/L (ref 96–112)
GFR calc Af Amer: 9 mL/min — ABNORMAL LOW (ref >90)
Potassium: 3.8 mEq/L (ref 3.5–5.1)

## 2013-03-31 LAB — CBC
HCT: 29.5 % — ABNORMAL LOW (ref 36.0–46.0)
Hemoglobin: 10.6 g/dL — ABNORMAL LOW (ref 12.0–15.0)
WBC: 5.7 10*3/uL (ref 4.0–10.5)

## 2013-03-31 LAB — TSH: TSH: 265.062 u[IU]/mL — ABNORMAL HIGH (ref 0.350–4.500)

## 2013-03-31 LAB — ANA: Anti Nuclear Antibody(ANA): NEGATIVE

## 2013-03-31 MED ORDER — FUROSEMIDE 10 MG/ML IJ SOLN
40.0000 mg | Freq: Two times a day (BID) | INTRAMUSCULAR | Status: DC
  Administered 2013-03-31 – 2013-04-01 (×2): 40 mg via INTRAVENOUS
  Filled 2013-03-31 (×4): qty 4

## 2013-03-31 MED ORDER — AMLODIPINE BESYLATE 10 MG PO TABS
10.0000 mg | ORAL_TABLET | Freq: Every day | ORAL | Status: DC
  Administered 2013-03-31 – 2013-04-04 (×5): 10 mg via ORAL
  Filled 2013-03-31 (×5): qty 1

## 2013-03-31 NOTE — Progress Notes (Signed)
UR COMPLETED  

## 2013-03-31 NOTE — Progress Notes (Signed)
Negative for impaction post digital exam.

## 2013-03-31 NOTE — Progress Notes (Signed)
1 Probable ESRD  2 Diabetes Mellitus w/ complications  3 Hypertension, uncontrolled  4 Volume overload   Rec:  1 SPEP pending 2 BP control, no ACE or ARB; OK to diurese or use other meds 3 Diurese; stop all fluids  4 Doubt renal biopsy will change anything. (US shows relatively small kidneys-nephromegaly common with DM) 5 Dialysis education, vein mapping  Subjective: Interval History: Admits to poor f/u with primary MD  Objective: Vital signs in last 24 hours: Temp:  [97.8 F (36.6 C)-98 F (36.7 C)] 97.8 F (36.6 C) (06/26 0603) Pulse Rate:  [76-88] 81 (06/26 0603) Resp:  [18] 18 (06/26 0603) BP: (163-203)/(91-114) 190/105 mmHg (06/26 0908) SpO2:  [100 %] 100 % (06/26 0603) Weight:  [69.627 kg (153 lb 8 oz)] 69.627 kg (153 lb 8 oz) (06/25 1643) Weight change:   Intake/Output from previous day: 06/25 0701 - 06/26 0700 In: 360 [P.O.:360] Out: 1550 [Urine:1550] Intake/Output this shift:    General appearance: alert and cooperative Extremities: edema 1+ obese & blind  Lab Results:  Recent Labs  03/30/13 1205 03/31/13 0515  WBC 6.0 5.7  HGB 11.4* 10.6*  HCT 31.5* 29.5*  PLT 353 328   BMET:  Recent Labs  03/30/13 1205 03/31/13 0515  NA 130* 129*  K 2.9* 3.8  CL 97 100  CO2 16* 17*  GLUCOSE 203* 260*  BUN 53* 49*  CREATININE 6.87* 6.32*  CALCIUM 8.9 8.0*   No results found for this basename: PTH,  in the last 72 hours Iron Studies: No results found for this basename: IRON, TIBC, TRANSFERRIN, FERRITIN,  in the last 72 hours Studies/Results: US Renal  03/30/2013   *RADIOLOGY REPORT*  Clinical Data:  Renal failure  RENAL/URINARY TRACT ULTRASOUND COMPLETE  Comparison:  04/10/2004 abdominal ultrasound  Findings:  Right Kidney:  Normal in size and parenchymal echogenicity.  No evidence of mass or hydronephrosis. 10.5 cm length.  Left Kidney:  Normal in size and parenchymal echogenicity.  No evidence of mass or hydronephrosis. 9.0 cm length.  Bladder:  Appears  normal for degree of bladder distention. Bilateral ureteral jets demonstrated.  IMPRESSION: Normal study.   Original Report Authenticated By: Judie Petit. Shick, M.D.   Dg Abd Acute W/chest  03/30/2013   *RADIOLOGY REPORT*  Clinical Data: Hypertension, bronchitis, low back pressure with nausea and vomiting.  ACUTE ABDOMEN SERIES (ABDOMEN 2 VIEW & CHEST 1 VIEW)  Comparison: None.  Findings: Frontal view of the chest shows midline trachea and normal heart size.  Lungs are clear.  No pleural fluid.  Two views of the abdomen show a fair amount of stool in the colon. No small bowel dilatation.  IMPRESSION: Bowel gas pattern suggests at least mild constipation.   Original Report Authenticated By: Leanna Battles, M.D.   Scheduled: . heparin  5,000 Units Subcutaneous Q8H  . insulin aspart  0-15 Units Subcutaneous TID WC  . insulin glargine  11 Units Subcutaneous QHS  . polyethylene glycol  17 g Oral BID  . sodium chloride  3 mL Intravenous Q12H      LOS: 1 day   Marjory Meints C 03/31/2013,11:19 AM

## 2013-03-31 NOTE — Progress Notes (Signed)
FMTS Attending Daily Note:  Renold Don MD  [phone] pager  Family Practice pager:  [phone] I have seen and examined this patient and have reviewed their chart. I have discussed this patient with the resident. I agree with the resident's findings, assessment and care plan.  Additionally: - Please see my separate admit note.    Tobey Grim, MD 03/31/2013 4:38 PM

## 2013-03-31 NOTE — H&P (Signed)
FMTS Attending Admission Note: Susan Don MD Personal pager:  [phone] FPTS Service Pager:  [phone]  I  have seen and examined this patient, reviewed their chart. I have discussed this patient with the resident. I agree with the resident's findings, assessment and care plan.  Briefly, 32 yo F with hx/o TD1, HTN presenting with malaise, nausea, increasing LE edema for past week.  Seen at PCP last week, labs drawn.  Had noted swelling at that time.  Creatinine >6, last in 2013 was normal at 1.  Patient called, told to return.  First available appt was Tuesday, but patietn continue to experience increasing nausea and malaise and therefore presented to ED.  Found to have creatinine >6 persistent.  This AM, feels better. Some nausea overnight.    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  MMM Heart:  Grade II/VI SEM noted throghout Lungs:  Clear, poor inspiratory effort Abdomen: Soft/NT Ext:  +2 to edema to mid-shins  Imp/Plan: 1.  AKI - unclear baseline - Appreciate renal involvement/rec's.  - Agree with [place], SPEP, autoimmune titers. - Agree with diuresis  2.  DM1: - Home lantus 25 units BID.  Unclear why BID schedule - Will increase closer to home lantus dosage as CBGs not at goal - diabetic teaching  3.  Blindness: - will investigate to ensure proper home assistance with disability  Tobey Grim, MD 03/31/2013 9:49 AM

## 2013-03-31 NOTE — Progress Notes (Signed)
Nutrition Brief Note  Patient identified on the Malnutrition Screening Tool (MST) Report. Pt reports weight loss 2/2 recent bronchitis. Pt is eating very well per RN and nurse tech.  Pt asked this RD to review HD Nutrition Therapy with her. Provided Choose-A-Meal Booklet to patient. Reviewed food groups and provided written recommended serving sizes specifically determined for patient's current nutritional status.   Explained why diet restrictions are needed and provided lists of foods to limit/avoid that are high potassium, sodium, and phosphorus. Provided specific recommendations on safer alternatives of these foods. Strongly encouraged compliance of this diet.   Discussed importance of protein intake at each meal and snack. Provided examples of how to maximize protein intake throughout the day. Discussed need for fluid restriction with dialysis, importance of minimizing weight gain between HD treatments, and renal-friendly beverage options.  Encouraged pt to discuss specific diet questions/concerns with RD at HD outpatient facility. Teach back method used.  Expect fair compliance.  Body mass index is 27.61 kg/(m^2). Patient meets criteria for Overweight based on current BMI.   Current diet order is Carbohydrate Modified Medium, patient is consuming approximately 100% of meals at this time. Labs and medications reviewed.   No nutrition interventions warranted at this time. If nutrition issues arise, please consult RD.   Jarold Motto MS, RD, LDN Pager: [phone] After-hours pager: [phone]

## 2013-03-31 NOTE — Progress Notes (Signed)
Family Medicine Teaching Service Daily Progress Note Service Pager: [phone]  Subjective: feeling all right this morning. Still feels constipated and would like to try enema. Has tolerated PO. Did receive a dose of zofran for nausea.  Objective: Temp:  [97.8 F (36.6 C)-98.3 F (36.8 C)] 97.8 F (36.6 C) (06/26 0603) Pulse Rate:  [76-89] 81 (06/26 0603) Resp:  [18-20] 18 (06/26 0603) BP: (163-203)/(91-114) 190/105 mmHg (06/26 0908) SpO2:  [100 %] 100 % (06/26 0603) Weight:  [153 lb 8 oz (69.627 kg)] 153 lb 8 oz (69.627 kg) (06/25 1643) Exam: General: NAD, pleasant, cooperative Cardiovascular: RRR, 2/6 systolic murmur again auscultated Respiratory: CTAB, NWOB Abdomen: soft, nontender to palpation Extremities: calves NTTP bilaterally, no appreciable edema on my exam (but much edema per Dr. Tyson Alias)  I have reviewed the patient's medications, labs, imaging, and diagnostic testing.  Notable results are summarized below.  Medications:  Scheduled Meds: . heparin  5,000 Units Subcutaneous Q8H  . insulin aspart  0-15 Units Subcutaneous TID WC  . insulin glargine  11 Units Subcutaneous QHS  . polyethylene glycol  17 g Oral BID  . sodium chloride  3 mL Intravenous Q12H   Continuous Infusions:  PRN Meds:.hydrALAZINE, ondansetron (ZOFRAN) IV  Labs:  CBC  Recent Labs Lab 03/30/13 1205 03/31/13 0515  WBC 6.0 5.7  HGB 11.4* 10.6*  HCT 31.5* 29.5*  PLT 353 328     CMET  Recent Labs Lab 03/30/13 1205 03/31/13 0515  NA 130* 129*  K 2.9* 3.8  CL 97 100  CO2 16* 17*  BUN 53* 49*  CREATININE 6.87* 6.32*  GLUCOSE 203* 260*  CALCIUM 8.9 8.0*  AST 20  --   ALT 20  --   ALKPHOS 61  --   PROT 6.4  --   ALBUMIN 1.8*  --        Imaging/Diagnostic Tests:  Renal Ultrasound: -Right Kidney: Normal in size and parenchymal echogenicity. No evidence of mass or hydronephrosis. 10.5 cm length.  -Left Kidney: Normal in size and parenchymal echogenicity. No evidence of mass or  hydronephrosis. 9.0 cm length.  -Bladder: Appears normal for degree of bladder distention. Bilateral ureteral jets demonstrated.  IMPRESSION: Normal study.   Assessment/Plan:  [name] is a 32 y.o. year old female with a hx of T1DM, HTN, HLD presenting with acute (possibly on chronic) renal failure with creatinine markedly above baseline.   # ARF: creatinine 6.87 on admission, was 6.36 on 6/17, 1.4 in January 2013. ?diabetic nephropathy vs hypertensive nephropathy vs some acute process.  - cr slightly improved to 6.3 today - per renal recs, diurese with lasix. Will do 40 IV BID - monitor fluid status closely with strict I&O, daily weights  - hold all nephrotoxic medications including home lisinopril  - renal u/s unremarkable, SPEP & ANA pending [ ]  f/u renal recs  # N/V:  - likely secondary to ARF  - IV zofran prn   # Constipation: Patient reported and seen on abdominal series. Per patient, did have hard stool after enema yesterday. May be secondary to untreated hypothyroidism.  - miralax BID until soft stools - will also try soap suds enema today  # DM:  - continue home Lantus 12 units QAM and 11 units QHS  - moderate sliding scale  - A1c 8.3 at recent PCP visit   # HTN:  - will give prn IV hydralazine for now while holding home ACE  - start amlodipine 10mg  - lasix will also likely help  with HTN [ ]  f/u BP's  # HLD:  - recently had lipids checked at PCP's office. Defer treatment of lipids to PCP.   # Hypothyroidism:  - previous radionucleotide ablation from Graves Disease per EMR  - TSH reportedly very elevated at previous visit, will repeat here and start synthroid if elevated  [ ]  f/u TSH   # FEN/GI:  - SLIV - carb modified diet  # Prophylaxis:  - heparin SQ   # Dispo:  - pending clinical improvement and renal recommendations  # Code Status: discussed with patient upon admission, she desires to be full code   Levert Feinstein, MD [facility] Medicine  PGY-1 Service Pager [phone]

## 2013-04-01 ENCOUNTER — Inpatient Hospital Stay (HOSPITAL_COMMUNITY): Payer: BC Managed Care – PPO | Admitting: Anesthesiology

## 2013-04-01 ENCOUNTER — Encounter (HOSPITAL_COMMUNITY)
Admission: EM | Disposition: A | Discharge status: Home / Self Care | Payer: Self-pay | Source: Home / Self Care | Attending: Family Medicine

## 2013-04-01 ENCOUNTER — Encounter (HOSPITAL_COMMUNITY): Payer: Self-pay | Admitting: Anesthesiology

## 2013-04-01 ENCOUNTER — Inpatient Hospital Stay (HOSPITAL_COMMUNITY): Payer: BC Managed Care – PPO

## 2013-04-01 DIAGNOSIS — E872 Acidosis, unspecified: Secondary | ICD-10-CM | POA: Diagnosis not present

## 2013-04-01 DIAGNOSIS — H543 Unqualified visual loss, both eyes: Secondary | ICD-10-CM

## 2013-04-01 DIAGNOSIS — N19 Unspecified kidney failure: Secondary | ICD-10-CM

## 2013-04-01 DIAGNOSIS — K5900 Constipation, unspecified: Secondary | ICD-10-CM

## 2013-04-01 DIAGNOSIS — N186 End stage renal disease: Secondary | ICD-10-CM

## 2013-04-01 DIAGNOSIS — I120 Hypertensive chronic kidney disease with stage 5 chronic kidney disease or end stage renal disease: Secondary | ICD-10-CM | POA: Diagnosis not present

## 2013-04-01 DIAGNOSIS — I10 Essential (primary) hypertension: Secondary | ICD-10-CM

## 2013-04-01 HISTORY — PX: INSERTION OF DIALYSIS CATHETER: SHX1324

## 2013-04-01 LAB — PROTEIN ELECTROPHORESIS, SERUM
Gamma Globulin: 11.7 % (ref 11.1–18.8)
M-Spike, %: NOT DETECTED g/dL

## 2013-04-01 LAB — GLUCOSE, CAPILLARY: Glucose-Capillary: 168 mg/dL — ABNORMAL HIGH (ref 70–99)

## 2013-04-01 LAB — HEPATITIS B SURFACE ANTIGEN: Hepatitis B Surface Ag: NEGATIVE

## 2013-04-01 LAB — CBC
MCV: 82.3 fL (ref 78.0–100.0)
Platelets: 350 10*3/uL (ref 150–400)
RBC: 3.5 MIL/uL — ABNORMAL LOW (ref 3.87–5.11)
WBC: 6.7 10*3/uL (ref 4.0–10.5)

## 2013-04-01 LAB — BASIC METABOLIC PANEL
CO2: 21 mEq/L (ref 19–32)
Calcium: 8.2 mg/dL — ABNORMAL LOW (ref 8.4–10.5)
Chloride: 100 mEq/L (ref 96–112)
Sodium: 131 mEq/L — ABNORMAL LOW (ref 135–145)

## 2013-04-01 LAB — T3, FREE: T3, Free: 1.7 pg/mL — ABNORMAL LOW (ref 2.3–4.2)

## 2013-04-01 SURGERY — INSERTION OF DIALYSIS CATHETER
Anesthesia: General | Site: Neck | Wound class: Clean

## 2013-04-01 MED ORDER — LIDOCAINE HCL (PF) 1 % IJ SOLN: 5.0000 mL | INTRAMUSCULAR | Status: DC | PRN

## 2013-04-01 MED ORDER — POTASSIUM CHLORIDE CRYS ER 20 MEQ PO TBCR
40.0000 meq | EXTENDED_RELEASE_TABLET | Freq: Once | ORAL | Status: AC
  Administered 2013-04-01: 40 meq via ORAL
  Filled 2013-04-01: qty 2

## 2013-04-01 MED ORDER — HEPARIN SODIUM (PORCINE) 1000 UNIT/ML IJ SOLN
INTRAMUSCULAR | Status: AC
  Filled 2013-04-01: qty 1

## 2013-04-01 MED ORDER — OXYCODONE HCL 5 MG PO TABS: 5.0000 mg | ORAL_TABLET | Freq: Once | ORAL | Status: DC | PRN

## 2013-04-01 MED ORDER — SODIUM CHLORIDE 0.9 % IV SOLN: 100.0000 mL | INTRAVENOUS | Status: DC | PRN

## 2013-04-01 MED ORDER — MIDAZOLAM HCL 5 MG/5ML IJ SOLN
INTRAMUSCULAR | Status: DC | PRN
  Administered 2013-04-01: 2 mg via INTRAVENOUS

## 2013-04-01 MED ORDER — ONDANSETRON HCL 4 MG/2ML IJ SOLN
INTRAMUSCULAR | Status: DC | PRN
  Administered 2013-04-01: 4 mg via INTRAVENOUS

## 2013-04-01 MED ORDER — METOCLOPRAMIDE HCL 5 MG/ML IJ SOLN: 10.0000 mg | Freq: Once | INTRAMUSCULAR | Status: DC | PRN

## 2013-04-01 MED ORDER — NEPRO/CARBSTEADY PO LIQD: 237.0000 mL | ORAL | Status: DC | PRN

## 2013-04-01 MED ORDER — HEPARIN SODIUM (PORCINE) 1000 UNIT/ML DIALYSIS
20.0000 [IU]/kg | INTRAMUSCULAR | Status: DC | PRN
  Administered 2013-04-01: 1400 [IU] via INTRAVENOUS_CENTRAL
  Filled 2013-04-01: qty 2

## 2013-04-01 MED ORDER — PROPOFOL 10 MG/ML IV BOLUS
INTRAVENOUS | Status: DC | PRN
  Administered 2013-04-01: 160 mg via INTRAVENOUS

## 2013-04-01 MED ORDER — ALTEPLASE 2 MG IJ SOLR
2.0000 mg | Freq: Once | INTRAMUSCULAR | Status: DC | PRN
  Filled 2013-04-01: qty 2

## 2013-04-01 MED ORDER — CEFAZOLIN SODIUM 1-5 GM-% IV SOLN: 1.0000 g | INTRAVENOUS | Status: DC

## 2013-04-01 MED ORDER — SODIUM CHLORIDE 0.9 % IR SOLN
Status: DC | PRN
  Administered 2013-04-01: 11:00:00

## 2013-04-01 MED ORDER — 0.9 % SODIUM CHLORIDE (POUR BTL) OPTIME
TOPICAL | Status: DC | PRN
  Administered 2013-04-01: 1000 mL

## 2013-04-01 MED ORDER — SODIUM CHLORIDE 0.9 % IV SOLN
INTRAVENOUS | Status: DC | PRN
  Administered 2013-04-01: 11:00:00 via INTRAVENOUS

## 2013-04-01 MED ORDER — SENNOSIDES-DOCUSATE SODIUM 8.6-50 MG PO TABS
1.0000 | ORAL_TABLET | Freq: Every day | ORAL | Status: DC
  Administered 2013-04-01 – 2013-04-08 (×8): 1 via ORAL
  Filled 2013-04-01 (×10): qty 1

## 2013-04-01 MED ORDER — PHENYLEPHRINE HCL 10 MG/ML IJ SOLN
INTRAMUSCULAR | Status: DC | PRN
  Administered 2013-04-01: 160 ug via INTRAVENOUS
  Administered 2013-04-01 (×2): 80 ug via INTRAVENOUS

## 2013-04-01 MED ORDER — LEVOTHYROXINE SODIUM 50 MCG PO TABS
50.0000 ug | ORAL_TABLET | Freq: Every day | ORAL | Status: DC
  Administered 2013-04-01 – 2013-04-09 (×9): 50 ug via ORAL
  Filled 2013-04-01 (×10): qty 1

## 2013-04-01 MED ORDER — CEFAZOLIN SODIUM-DEXTROSE 2-3 GM-% IV SOLR
2.0000 g | INTRAVENOUS | Status: AC
  Administered 2013-04-01: 2 g via INTRAVENOUS
  Filled 2013-04-01: qty 50

## 2013-04-01 MED ORDER — LIDOCAINE HCL (CARDIAC) 20 MG/ML IV SOLN
INTRAVENOUS | Status: DC | PRN
  Administered 2013-04-01: 100 mg via INTRAVENOUS

## 2013-04-01 MED ORDER — PENTAFLUOROPROP-TETRAFLUOROETH EX AERO: 1.0000 "application " | INHALATION_SPRAY | CUTANEOUS | Status: DC | PRN

## 2013-04-01 MED ORDER — HEPARIN SODIUM (PORCINE) 1000 UNIT/ML IJ SOLN
INTRAMUSCULAR | Status: DC | PRN
  Administered 2013-04-01: 4.2 mL via INTRAVENOUS

## 2013-04-01 MED ORDER — LIDOCAINE-PRILOCAINE 2.5-2.5 % EX CREA: 1.0000 "application " | TOPICAL_CREAM | CUTANEOUS | Status: DC | PRN

## 2013-04-01 MED ORDER — HEPARIN SODIUM (PORCINE) 1000 UNIT/ML DIALYSIS
1000.0000 [IU] | INTRAMUSCULAR | Status: DC | PRN
  Filled 2013-04-01: qty 1

## 2013-04-01 MED ORDER — OXYCODONE HCL 5 MG/5ML PO SOLN: 5.0000 mg | Freq: Once | ORAL | Status: DC | PRN

## 2013-04-01 MED ORDER — POLYETHYLENE GLYCOL 3350 17 G PO PACK
17.0000 g | PACK | Freq: Three times a day (TID) | ORAL | Status: DC
  Administered 2013-04-01 – 2013-04-03 (×7): 17 g via ORAL
  Filled 2013-04-01 (×26): qty 1

## 2013-04-01 MED ORDER — FENTANYL CITRATE 0.05 MG/ML IJ SOLN: 25.0000 ug | INTRAMUSCULAR | Status: DC | PRN

## 2013-04-01 SURGICAL SUPPLY — 50 items
ADH SKN CLS APL DERMABOND .7 (GAUZE/BANDAGES/DRESSINGS) ×1
BAG DECANTER FOR FLEXI CONT (MISCELLANEOUS) ×2 IMPLANT
CATH CANNON HEMO 15F 50CM (CATHETERS) IMPLANT
CATH CANNON HEMO 15FR 19 (HEMODIALYSIS SUPPLIES) ×1 IMPLANT
CATH CANNON HEMO 15FR 23CM (HEMODIALYSIS SUPPLIES) IMPLANT
CATH CANNON HEMO 15FR 31CM (HEMODIALYSIS SUPPLIES) IMPLANT
CATH CANNON HEMO 15FR 32 (HEMODIALYSIS SUPPLIES) IMPLANT
CATH CANNON HEMO 15FR 32CM (HEMODIALYSIS SUPPLIES) IMPLANT
CLOTH BEACON ORANGE TIMEOUT ST (SAFETY) ×2 IMPLANT
COVER PROBE W GEL 5X96 (DRAPES) ×2 IMPLANT
COVER SURGICAL LIGHT HANDLE (MISCELLANEOUS) ×2 IMPLANT
DERMABOND ADVANCED (GAUZE/BANDAGES/DRESSINGS) ×1
DERMABOND ADVANCED .7 DNX12 (GAUZE/BANDAGES/DRESSINGS) ×1 IMPLANT
DRAPE C-ARM 42X72 X-RAY (DRAPES) ×2 IMPLANT
DRAPE CHEST BREAST 15X10 FENES (DRAPES) ×2 IMPLANT
GAUZE SPONGE 2X2 8PLY STRL LF (GAUZE/BANDAGES/DRESSINGS) IMPLANT
GAUZE SPONGE 4X4 16PLY XRAY LF (GAUZE/BANDAGES/DRESSINGS) ×2 IMPLANT
GLOVE BIOGEL PI IND STRL 6.5 (GLOVE) IMPLANT
GLOVE BIOGEL PI IND STRL 7.5 (GLOVE) IMPLANT
GLOVE BIOGEL PI INDICATOR 6.5 (GLOVE) ×1
GLOVE BIOGEL PI INDICATOR 7.5 (GLOVE) ×1
GLOVE ECLIPSE 7.0 STRL STRAW (GLOVE) ×1 IMPLANT
GLOVE SS BIOGEL STRL SZ 7 (GLOVE) IMPLANT
GLOVE SUPERSENSE BIOGEL SZ 7 (GLOVE) ×1
GLOVE SURG SS PI 7.0 STRL IVOR (GLOVE) ×1 IMPLANT
GLOVE SURG SS PI 7.5 STRL IVOR (GLOVE) ×1 IMPLANT
GOWN STRL NON-REIN LRG LVL3 (GOWN DISPOSABLE) ×2 IMPLANT
GOWN STRL REIN XL XLG (GOWN DISPOSABLE) ×2 IMPLANT
KIT BASIN OR (CUSTOM PROCEDURE TRAY) ×2 IMPLANT
KIT ROOM TURNOVER OR (KITS) ×2 IMPLANT
NDL 18GX1X1/2 (RX/OR ONLY) (NEEDLE) ×1 IMPLANT
NDL HYPO 25GX1X1/2 BEV (NEEDLE) ×1 IMPLANT
NEEDLE 18GX1X1/2 (RX/OR ONLY) (NEEDLE) ×2 IMPLANT
NEEDLE HYPO 25GX1X1/2 BEV (NEEDLE) ×2 IMPLANT
NS IRRIG 1000ML POUR BTL (IV SOLUTION) ×2 IMPLANT
PACK SURGICAL SETUP 50X90 (CUSTOM PROCEDURE TRAY) ×2 IMPLANT
PAD ARMBOARD 7.5X6 YLW CONV (MISCELLANEOUS) ×4 IMPLANT
SOAP 2 % CHG 4 OZ (WOUND CARE) ×2 IMPLANT
SPONGE GAUZE 2X2 STER 10/PKG (GAUZE/BANDAGES/DRESSINGS) ×1
SUT ETHILON 3 0 PS 1 (SUTURE) ×2 IMPLANT
SUT VICRYL 4-0 PS2 18IN ABS (SUTURE) ×2 IMPLANT
SYR 20CC LL (SYRINGE) ×4 IMPLANT
SYR 30ML LL (SYRINGE) IMPLANT
SYR 5ML LL (SYRINGE) ×2 IMPLANT
SYR CONTROL 10ML LL (SYRINGE) ×2 IMPLANT
SYRINGE 10CC LL (SYRINGE) ×2 IMPLANT
TAPE CLOTH SURG 4X10 WHT LF (GAUZE/BANDAGES/DRESSINGS) ×1 IMPLANT
TOWEL OR 17X24 6PK STRL BLUE (TOWEL DISPOSABLE) ×2 IMPLANT
TOWEL OR 17X26 10 PK STRL BLUE (TOWEL DISPOSABLE) ×2 IMPLANT
WATER STERILE IRR 1000ML POUR (IV SOLUTION) ×2 IMPLANT

## 2013-04-01 NOTE — Preoperative (Signed)
Beta Blockers   Reason not to administer Beta Blockers:Not Applicable 

## 2013-04-01 NOTE — H&P (Signed)
Vascular Surgery H&P  Chief Complaint: needs dialysis  HPI: [name] is a 32 y.o. female who presents for evaluation of needs acvcess for dialysis  Past Medical History  Diagnosis Date  . Diabetes mellitus without complication   . Blind   . Hypothyroidism    History reviewed. No pertinent past surgical history. History   Social History  . Marital Status: Single    Spouse Name: N/A    Number of Children: N/A  . Years of Education: N/A   Social History Main Topics  . Smoking status: Never Smoker   . Smokeless tobacco: None  . Alcohol Use: No  . Drug Use: No  . Sexually Active: None   Other Topics Concern  . None   Social History Narrative  . None   Family History  Problem Relation Age of Onset  . Hypertension    . Cancer    . Thyroid disease     No Known Allergies Prior to Admission medications   Medication Sig Start Date End Date Taking? Authorizing Provider  azithromycin (ZITHROMAX) 250 MG tablet Take 250-500 mg by mouth See admin instructions. Take two tablet by mouth today, then take 1 tablet daily for 4 days   Yes Historical Provider, MD  benzonatate (TESSALON) 100 MG capsule Take 100-200 mg by mouth every 8 (eight) hours as needed for cough.   Yes Historical Provider, MD  fish oil-omega-3 fatty acids 1000 MG capsule Take 1 g by mouth daily.   Yes Historical Provider, MD  Garlic 500 MG CAPS Take 1 capsule by mouth daily.   Yes Historical Provider, MD  insulin aspart (NOVOLOG) 100 UNIT/ML injection Inject 5-10 Units into the skin See admin instructions. Titration of additional 1 unit per every 50 spike in blood sugar level max of 200   Yes Historical Provider, MD  insulin glargine (LANTUS) 100 UNIT/ML injection Inject 25 Units into the skin 2 (two) times daily.   Yes Historical Provider, MD  lisinopril-hydrochlorothiazide (PRINZIDE,ZESTORETIC) 10-12.5 MG per tablet Take 1 tablet by mouth daily.   Yes Historical Provider, MD  Multiple Vitamin (MULTI-VITAMIN  DAILY PO) Take 1 tablet by mouth daily.   Yes Historical Provider, MD     Positive ROS:   All other systems have been reviewed and were otherwise negative with the exception of those mentioned in the HPI and as above.  Physical Exam: Filed Vitals:   04/01/13 0536  BP: 133/76  Pulse: 83  Temp: 98.3 F (36.8 C)  Resp: 18    General: Alert, no acute distress HEENT: Normal for age Cardiovascular: Regular rate and rhythm. Carotid pulses 2+, no bruits audible Respiratory: Clear to auscultation. No cyanosis, no use of accessory musculature GI: No organomegaly, abdomen is soft and non-tender Skin: No lesions in the area of chief complaint Neurologic: Sensation intact distally Psychiatric: Patient is competent for consent with normal mood and affect Musculoskeletal: No obvious deformities Extremities: unremarkable     Assessment/Plan: Plan insertion of HD cath today   ---discussed with pt and she is agreeable    Josephina Gip, MD 04/01/2013 11:26 AM

## 2013-04-01 NOTE — Anesthesia Postprocedure Evaluation (Signed)
Anesthesia Post Note  Patient: [name]  Procedure(s) Performed: Procedure(s) (LRB): INSERTION OF DIALYSIS CATHETER (N/A)  Anesthesia type: General  Patient location: PACU  Post pain: Pain level controlled  Post assessment: Patient's Cardiovascular Status Stable  Last Vitals:  Filed Vitals:   04/01/13 1300  BP: 160/91  Pulse: 80  Temp:   Resp: 10    Post vital signs: Reviewed and stable  Level of consciousness: alert  Complications: No apparent anesthesia complications

## 2013-04-01 NOTE — Progress Notes (Signed)
Family Medicine Teaching Service Daily Progress Note Service Pager: [phone]  Subjective: Pt has no complaints this morning. Still expresses some discomfort surrounding her bowels, but says it is not painful. Currently NPO  Objective: Temp:  [98.3 F (36.8 C)-98.8 F (37.1 C)] 98.3 F (36.8 C) (06/27 0536) Pulse Rate:  [83-85] 83 (06/27 0536) Resp:  [18] 18 (06/27 0536) BP: (133-190)/(76-105) 133/76 mmHg (06/27 0536) SpO2:  [98 %-100 %] 99 % (06/27 0536) Weight:  [153 lb 3.2 oz (69.491 kg)] 153 lb 3.2 oz (69.491 kg) (06/26 2124) Exam: General: NAD, pleasant, cooperative Cardiovascular: RRR, 2/6 systolic murmur again auscultated Respiratory: CTAB, NWOB Abdomen: soft, nontender to palpation Extremities: 2+ pitting edema on bilateral lower shins. Calves nontender to palpation  I have reviewed the patient's medications, labs, imaging, and diagnostic testing.  Notable results are summarized below.   Medications:  Scheduled Meds: . amLODipine  10 mg Oral Daily  . furosemide  40 mg Intravenous BID  . heparin  5,000 Units Subcutaneous Q8H  . insulin aspart  0-15 Units Subcutaneous TID WC  . insulin glargine  11 Units Subcutaneous QHS  . levothyroxine  50 mcg Oral QAC breakfast  . polyethylene glycol  17 g Oral BID  . potassium chloride  40 mEq Oral Once  . sodium chloride  3 mL Intravenous Q12H   Continuous Infusions:  PRN Meds:.hydrALAZINE, ondansetron (ZOFRAN) IV  Labs:  CBC  Recent Labs Lab 03/30/13 1205 03/31/13 0515 04/01/13 0605  WBC 6.0 5.7 6.7  HGB 11.4* 10.6* 10.3*  HCT 31.5* 29.5* 28.8*  PLT 353 328 350     CMET  Recent Labs Lab 03/30/13 1205 03/31/13 0515 04/01/13 0605  NA 130* 129* 131*  K 2.9* 3.8 3.1*  CL 97 100 100  CO2 16* 17* 21  BUN 53* 49* 49*  CREATININE 6.87* 6.32* 7.23*  GLUCOSE 203* 260* 228*  CALCIUM 8.9 8.0* 8.2*  AST 20  --   --   ALT 20  --   --   ALKPHOS 61  --   --   PROT 6.4  --   --   ALBUMIN 1.8*  --   --        ANA negative  Imaging/Diagnostic Tests:  Renal Ultrasound: -Right Kidney: Normal in size and parenchymal echogenicity. No evidence of mass or hydronephrosis. 10.5 cm length.  -Left Kidney: Normal in size and parenchymal echogenicity. No evidence of mass or hydronephrosis. 9.0 cm length.  -Bladder: Appears normal for degree of bladder distention. Bilateral ureteral jets demonstrated.  IMPRESSION: Normal study.   Assessment/Plan:  [name] is a 32 y.o. year old female with a hx of T1DM, HTN, HLD presenting with acute (possibly on chronic) renal failure with creatinine markedly above baseline.   # ARF: creatinine 6.87 on admission, was 6.36 on 6/17, 1.4 in January 2013. ?diabetic nephropathy vs hypertensive nephropathy vs some acute process.  - cr increased to 7.23 today - currently on lasix 40 IV BID with 1.45L UOP yesterday - monitor fluid status closely with strict I&O, daily weights  - hold all nephrotoxic medications including home lisinopril  - renal u/s unremarkable, ANA negative, SPEP pending - nephrology asking VVS to place dialysis access today, NPO [ ]  f/u renal recs  # N/V:  - likely secondary to ARF  - IV zofran prn   # Constipation: Patient reported and seen on abdominal series. May be secondary to untreated hypothyroidism.  - s/p soap suds enema - no stool felt during  disimpaction per RN note - add sennakot and increase miralax today to TID  # DM:  - currently on Lantus 11 units QHS  (normally does 12 u QAM at home as well) - moderate sliding scale, required 13 units SSI in last 24 hours with CBG's ranging from 180-279 - A1c 8.3 at recent PCP visit   # HTN: BP's improving - holding home ACE due to AKI - continue amlodipine 10mg  with prn IV hydralazine - lasix will also likely help with HTN [ ]  f/u BP's  # HLD:  - recently had lipids checked at PCP's office. Defer treatment of lipids to PCP.   # Hypothyroidism:  - previous radionucleotide ablation  from Graves Disease per EMR  - TSH 265 with low T3 & T4 so start synthroid 50 mcg daily today, titrate up [ ]  needs f/u TSH in 6 weeks  # FEN/GI:  - SLIV - NPO now pending vascular access for dialysis  # Prophylaxis:  - heparin SQ   # Dispo:  - pending clinical improvement and renal recommendations  # Code Status: discussed with patient upon admission, she desires to be full code   Levert Feinstein, MD [facility] Medicine PGY-1 Service Pager [phone]

## 2013-04-01 NOTE — Progress Notes (Signed)
FMTS Attending Daily Note:  Renold Don MD  [phone] pager  Family Practice pager:  [phone] I have seen and examined this patient and have reviewed their chart. I have discussed this patient with the resident. I agree with the resident's findings, assessment and care plan.  Additionally: - For dialysis today.  - Appreciate renal input.   - Answered patients questions regarding dialysis process.   Tobey Grim, MD 04/01/2013 1:53 PM

## 2013-04-01 NOTE — Anesthesia Preprocedure Evaluation (Addendum)
Anesthesia Evaluation  Patient identified by MRN, date of birth, ID band Patient awake    Reviewed: Allergy & Precautions, H&P , NPO status , Patient's Chart, lab work & pertinent test results, reviewed documented beta blocker date and time   Airway Mallampati: II TM Distance: >3 FB Neck ROM: full    Dental  (+) Dental Advisory Given   Pulmonary neg pulmonary ROS,  breath sounds clear to auscultation        Cardiovascular hypertension, On Medications Rhythm:regular     Neuro/Psych Blind  negative psych ROS   GI/Hepatic negative GI ROS, Neg liver ROS,   Endo/Other  diabetes, Type 1, Insulin DependentHypothyroidism   Renal/GU negative Renal ROS  negative genitourinary   Musculoskeletal   Abdominal   Peds  Hematology negative hematology ROS (+)   Anesthesia Other Findings See surgeon's H&P   Reproductive/Obstetrics negative OB ROS                          Anesthesia Physical Anesthesia Plan  ASA: III  Anesthesia Plan: General   Post-op Pain Management:    Induction: Intravenous  Airway Management Planned: LMA  Additional Equipment:   Intra-op Plan:   Post-operative Plan:   Informed Consent: I have reviewed the patients History and Physical, chart, labs and discussed the procedure including the risks, benefits and alternatives for the proposed anesthesia with the patient or authorized representative who has indicated his/her understanding and acceptance.   Dental Advisory Given  Plan Discussed with: CRNA, Surgeon and Anesthesiologist  Anesthesia Plan Comments:        Anesthesia Quick Evaluation

## 2013-04-01 NOTE — Anesthesia Procedure Notes (Signed)
Procedure Name: LMA Insertion Date/Time: 04/01/2013 11:51 AM Performed by: Rogelia Boga Pre-anesthesia Checklist: Patient identified, Emergency Drugs available, Suction available, Patient being monitored and Timeout performed Patient Re-evaluated:Patient Re-evaluated prior to inductionOxygen Delivery Method: Circle system utilized Preoxygenation: Pre-oxygenation with 100% oxygen Intubation Type: IV induction LMA: LMA inserted LMA Size: 4.0 Number of attempts: 1 Placement Confirmation: positive ETCO2 and breath sounds checked- equal and bilateral Tube secured with: Tape Dental Injury: Teeth and Oropharynx as per pre-operative assessment

## 2013-04-01 NOTE — Transfer of Care (Signed)
Immediate Anesthesia Transfer of Care Note  Patient: [name]  Procedure(s) Performed: Procedure(s): INSERTION OF DIALYSIS CATHETER (N/A)  Patient Location: PACU  Anesthesia Type:General  Level of Consciousness: awake, alert , oriented and patient cooperative  Airway & Oxygen Therapy: Patient Spontanous Breathing and Patient connected to nasal cannula oxygen  Post-op Assessment: Report given to PACU RN, Post -op Vital signs reviewed and stable and Patient moving all extremities X 4  Post vital signs: Reviewed and stable  Complications: No apparent anesthesia complications

## 2013-04-01 NOTE — Op Note (Signed)
OPERATIVE REPORT  Date of Surgery: 03/30/2013 - 04/01/2013  Surgeon: Josephina Gip, MD  Assistant: Nurse  Pre-op Diagnosis: ESRD  Post-op Diagnosis: ESRD  Procedure: Procedure(s): #1 bilateral ultrasound localization internal jugular veins #2 insertion hemodialysis catheter via right IJ-19 cm hDiateck Anesthesia: LMA  EBL: Minimal  Complications: None  Procedure Details: Patient to the operating room placed in supine position at which time satisfactory LMA general anesthesia was administered. Both internal jugular veins were imaged using B-mode ultrasound and both were noted to be widely patent right better than left. After prepping and draping in routine sterile manner the right IJ was entered using a supraclavicular approach guidewire passed in the right atrium under fluoroscopic guidance. After dilating the tract appropriately a 19 cm hemodialysis catheter was passed through a peel-away sheath positioned in the right atrium tunneled peripherally and secured with nylon sutures. The wound was closed with Vicryl in a subcuticular fashion. Sterile dressing applied patient taken to the recovery room in stable condition for chest x-ray   Josephina Gip, MD 04/01/2013 12:16 PM

## 2013-04-01 NOTE — Progress Notes (Signed)
Right  Upper Extremity Vein Map    Cephalic  Segment Diameter Depth Comment  1. Axilla 1.65mm    2. Mid upper arm   Unable to visualize.  3. Above [facility]   Unable to visualize.  4. In [facility]   Unable to visualize.  5. Below AC   Unable to visualize.  6. Mid forearm   Unable to visualize.  7. Wrist   Unable to visualize.                  Basilic  Segment Diameter Depth Comment  1. Axilla   Thrombosed.  2. Mid upper arm   Unable to visualize.  3. Above [facility]   Unable to visualize.  4. In [facility]   Unable to visualize.  5. Below AC   Unable to visualize.  6. Mid forearm   Unable to visualize.  7. Wrist   Unable to visualize.                  Left Upper Extremity Vein Map    Cephalic  Segment Diameter Depth Comment  1. Axilla 1.12mm    2. Mid upper arm 1.45mm    3. Above [facility]   Unable to visualize.  4. In [facility]   Unable to visualize.  5. Below AC   Unable to visualize.  6. Mid forearm   Unable to visualize.  7. Wrist   Unable to visualize.                  Basilic  Segment Diameter Depth Comment  1. Axilla   Unable to visualize.  2. Mid upper arm   Unable to visualize.  3. Above [facility]   Unable to visualize.  4. In [facility]   Unable to visualize.  5. Below AC   Unable to visualize.  6. Mid forearm   Unable to visualize.  7. Wrist   Unable to visualize.                   Unable to visualize the left cephalic vein in its entirety due to IV placement. 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   04/01/2013 11:42 AM Gertie Fey, RVT, RDCS, RDMS

## 2013-04-01 NOTE — Progress Notes (Signed)
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  Will see if VVS can help with today as pt now NPO. Check phos, PTH and add binders or vita D as needed.  Add rena-vite.   Subjective: Interval History: c/o nausea and malaise  Objective: Vital signs in last 24 hours: Temp:  [98.3 F (36.8 C)-98.8 F (37.1 C)] 98.3 F (36.8 C) (06/27 0536) Pulse Rate:  [83-85] 83 (06/27 0536) Resp:  [18] 18 (06/27 0536) BP: (133-190)/(76-105) 133/76 mmHg (06/27 0536) SpO2:  [98 %-100 %] 99 % (06/27 0536) Weight:  [69.491 kg (153 lb 3.2 oz)] 69.491 kg (153 lb 3.2 oz) (06/26 2124) Weight change: -0.136 kg (-4.8 oz)  Intake/Output from previous day: 06/26 0701 - 06/27 0700 In: 600 [P.O.:600] Out: 1450 [Urine:1450] Intake/Output this shift:    General appearance: alert and cooperative Resp: clear to auscultation bilaterally Cardio: regular rate and rhythm, S1, S2 normal, no murmur, click, rub or gallop Extremities: edema 1+  Lab Results:  Recent Labs  03/31/13 0515 04/01/13 0605  WBC 5.7 6.7  HGB 10.6* 10.3*  HCT 29.5* 28.8*  PLT 328 350   BMET:  Recent Labs  03/31/13 0515 04/01/13 0605  NA 129* 131*  K 3.8 3.1*  CL 100 100  CO2 17* 21  GLUCOSE 260* 228*  BUN 49* 49*  CREATININE 6.32* 7.23*  CALCIUM 8.0* 8.2*   No results found for this basename: PTH,  in the last 72 hours Iron Studies: No results found for this basename: IRON, TIBC, TRANSFERRIN, FERRITIN,  in the last 72 hours Studies/Results: US Renal  03/30/2013   *RADIOLOGY REPORT*  Clinical Data:  Renal failure  RENAL/URINARY TRACT ULTRASOUND COMPLETE  Comparison:  04/10/2004 abdominal ultrasound  Findings:  Right Kidney:  Normal in size and parenchymal echogenicity.  No evidence of mass or hydronephrosis. 10.5 cm length.  Left Kidney:  Normal in size and parenchymal echogenicity.  No evidence of mass or hydronephrosis. 9.0  cm length.  Bladder:  Appears normal for degree of bladder distention. Bilateral ureteral jets demonstrated.  IMPRESSION: Normal study.   Original Report Authenticated By: Judie Petit. Shick, M.D.   Dg Abd Acute W/chest  03/30/2013   *RADIOLOGY REPORT*  Clinical Data: Hypertension, bronchitis, low back pressure with nausea and vomiting.  ACUTE ABDOMEN SERIES (ABDOMEN 2 VIEW & CHEST 1 VIEW)  Comparison: None.  Findings: Frontal view of the chest shows midline trachea and normal heart size.  Lungs are clear.  No pleural fluid.  Two views of the abdomen show a fair amount of stool in the colon. No small bowel dilatation.  IMPRESSION: Bowel gas pattern suggests at least mild constipation.   Original Report Authenticated By: Leanna Battles, M.D.    Scheduled: . amLODipine  10 mg Oral Daily  . furosemide  40 mg Intravenous BID  . heparin  5,000 Units Subcutaneous Q8H  . insulin aspart  0-15 Units Subcutaneous TID WC  . insulin glargine  11 Units Subcutaneous QHS  . levothyroxine  50 mcg Oral QAC breakfast  . polyethylene glycol  17 g Oral BID  . potassium chloride  40 mEq Oral Once  . sodium chloride  3 mL Intravenous Q12H     LOS: 2 days   [name] 04/01/2013,8:44 AM

## 2013-04-02 DIAGNOSIS — E109 Type 1 diabetes mellitus without complications: Secondary | ICD-10-CM | POA: Diagnosis present

## 2013-04-02 DIAGNOSIS — E872 Acidosis, unspecified: Secondary | ICD-10-CM | POA: Diagnosis not present

## 2013-04-02 DIAGNOSIS — I82629 Acute embolism and thrombosis of deep veins of unspecified upper extremity: Secondary | ICD-10-CM | POA: Diagnosis not present

## 2013-04-02 DIAGNOSIS — I120 Hypertensive chronic kidney disease with stage 5 chronic kidney disease or end stage renal disease: Secondary | ICD-10-CM | POA: Diagnosis not present

## 2013-04-02 LAB — BASIC METABOLIC PANEL
CO2: 24 mEq/L (ref 19–32)
Chloride: 100 mEq/L (ref 96–112)
Sodium: 135 mEq/L (ref 135–145)

## 2013-04-02 LAB — GLUCOSE, CAPILLARY
Glucose-Capillary: 254 mg/dL — ABNORMAL HIGH (ref 70–99)
Glucose-Capillary: 213 mg/dL — ABNORMAL HIGH (ref 70–99)
Glucose-Capillary: 146 mg/dL — ABNORMAL HIGH (ref 70–99)

## 2013-04-02 LAB — PROTIME-INR
INR: 1.01 (ref 0.00–1.49)
Prothrombin Time: 13.1 seconds (ref 11.6–15.2)

## 2013-04-02 LAB — CBC
HCT: 30.1 % — ABNORMAL LOW (ref 36.0–46.0)
MCV: 84.6 fL (ref 78.0–100.0)
Platelets: 287 10*3/uL (ref 150–400)
RBC: 3.56 MIL/uL — ABNORMAL LOW (ref 3.87–5.11)
WBC: 6 10*3/uL (ref 4.0–10.5)

## 2013-04-02 LAB — HEPATITIS B CORE ANTIBODY, TOTAL: Hep B Core Total Ab: NEGATIVE

## 2013-04-02 MED ORDER — HEPARIN BOLUS VIA INFUSION
3000.0000 [IU] | Freq: Once | INTRAVENOUS | Status: DC
  Filled 2013-04-02: qty 3000

## 2013-04-02 MED ORDER — WARFARIN - PHARMACIST DOSING INPATIENT: Freq: Every day | Status: DC

## 2013-04-02 MED ORDER — WARFARIN VIDEO
Freq: Once | Status: AC
  Administered 2013-04-02: 22:00:00

## 2013-04-02 MED ORDER — HEPARIN (PORCINE) IN NACL 100-0.45 UNIT/ML-% IJ SOLN
1100.0000 [IU]/h | INTRAMUSCULAR | Status: DC
  Administered 2013-04-02: 1100 [IU]/h via INTRAVENOUS
  Filled 2013-04-02 (×2): qty 250

## 2013-04-02 MED ORDER — COUMADIN BOOK
Freq: Once | Status: AC
  Administered 2013-04-02: 18:00:00
  Filled 2013-04-02: qty 1

## 2013-04-02 MED ORDER — WARFARIN SODIUM 10 MG PO TABS
10.0000 mg | ORAL_TABLET | Freq: Once | ORAL | Status: AC
  Administered 2013-04-02: 10 mg via ORAL
  Filled 2013-04-02: qty 1

## 2013-04-02 NOTE — Progress Notes (Signed)
Tolerated first hemodialysis treatment.  2000 cc removed. Mild cramping post treatment.  BP improved. Nausea better. For venograms on Monday due to brachial thrombosisi bilat.  Would prefer to avoid home peritoneal dialysis given her recent history of nonadhering to therapy. Will plan for next dialysis on Monday.  Exam less edema, lungs clear heart RRR, face looks less round. Jahdai Padovano C

## 2013-04-02 NOTE — Progress Notes (Signed)
ANTICOAGULATION CONSULT NOTE - Initial Consult  Pharmacy Consult for Heparin and Coumadin Indication: Bilateral Upper Extremity DVTs  No Known Allergies  Patient Measurements: Height: 5' 2.5" (158.8 cm) Weight: 149 lb 11.1 oz (67.9 kg) IBW/kg (Calculated) : 51.25 Heparin Dosing Weight: 65 kg  Vital Signs: Temp: 99 F (37.2 C) (06/28 0908) Temp src: Oral (06/28 0908) BP: 125/76 mmHg (06/28 0908) Pulse Rate: 90 (06/28 0908)  Labs:  Recent Labs  03/31/13 0515 04/01/13 0605 04/02/13 1022  HGB 10.6* 10.3* 10.7*  HCT 29.5* 28.8* 30.1*  PLT 328 350 287  CREATININE 6.32* 7.23* 5.98*   Estimated Creatinine Clearance: 12.7 ml/min (by C-G formula based on Cr of 5.98).  Medical History: Past Medical History  Diagnosis Date  . Diabetes mellitus without complication   . Blind   . Hypothyroidism    Assessment: 32 YO female with acute renal failure s/p first dialysis 6/27 to receive dialysis again on Monday now with bilateral upper extremity DVTs to start IV heparin + Coumadin bridge. Hypercoagulable studies pending. Baseline INR 1.01. Patient was not on anticoagulants prior to admission. LFTs were wnl on admission. H/H is low but stable. Platelets are stable. No bleeding reported.   Goal of Therapy:  INR 2-3 Heparin level 0.3-0.7 units/ml Monitor platelets by anticoagulation protocol: Yes   Plan:  1. Coumadin 10mg  po x1 tonight. 2. No Heparin bolus due to recent administration of 5,000 units sq heparin. Start heparin drip at 1100 units/hr.  3. Heparin level in 8 hours.  4. Daily PT/INR, CBC, and Heparin level while on therapy. 5. Coumadin book and video to initiate education.   Link Snuffer, PharmD, BCPS Clinical Pharmacist [phone] 04/02/2013,12:28 PM

## 2013-04-02 NOTE — Progress Notes (Signed)
[facility] [place] [place]  Subjective  - POD #1  Diatek placed yesterday VEin Mapping done C/o swelling in left arm   Physical Exam:  Palpable radial pulses Bilateral Left arm in more edematous than right       Assessment/Plan:  POD #1  Vein map shows thrombus in bilateral brachial veins.  Will defer to medical service as to treat this with anticoagulation.  Patient will need bilateral venogram before access plan finalized.  This can be done on Monday.  With brachial vein thrombosis bilaterally, pt may want to consider PD if she is a candidate.  BRABHAM IV, V. WELLS 04/02/2013 6:51 AM --  Filed Vitals:   04/02/13 0528  BP: 124/76  Pulse: 87  Temp: 98.3 F (36.8 C)  Resp: 18    Intake/Output Summary (Last 24 hours) at 04/02/13 0651 Last data filed at 04/02/13 0529  Gross per 24 hour  Intake    560 ml  Output   3950 ml  Net  -3390 ml     Laboratory CBC    Component Value Date/Time   WBC 6.7 04/01/2013 0605   HGB 10.3* 04/01/2013 0605   HCT 28.8* 04/01/2013 0605   PLT 350 04/01/2013 0605    BMET    Component Value Date/Time   NA 131* 04/01/2013 0605   K 3.1* 04/01/2013 0605   CL 100 04/01/2013 0605   CO2 21 04/01/2013 0605   GLUCOSE 228* 04/01/2013 0605   BUN 49* 04/01/2013 0605   CREATININE 7.23* 04/01/2013 0605   CALCIUM 8.2* 04/01/2013 0605   GFRNONAA 7* 04/01/2013 0605   GFRAA 8* 04/01/2013 0605    COAG No results found for this basename: INR, PROTIME   No results found for this basename: PTT    Antibiotics Anti-infectives   Start     Dose/Rate Route Frequency Ordered Stop   04/01/13 1000  ceFAZolin (ANCEF) IVPB 2 g/50 mL premix     2 g 100 mL/hr over 30 Minutes Intravenous On call 04/01/13 0945 04/01/13 1141   04/01/13 0945  ceFAZolin (ANCEF) IVPB 1 g/50 mL premix  Status:  Discontinued    Comments:  Send with pt to OR   1 g 100 mL/hr over 30 Minutes Intravenous On call 04/01/13 0943 04/01/13 0944       V. Charlena Cross, M.D. [facility] [place] Office: [phone] Pager:  [phone]

## 2013-04-02 NOTE — Progress Notes (Signed)
FMTS Attending Daily Note:  Renold Don MD  [phone] pager  Family Practice pager:  [phone] I have discussed this patient with the resident Dr. Pollie Meyer.  I agree with her findings, assessment, and care plan.  Additionally: - Dialysis yesterday, patient's first.  She did well with this.  Appreciate renal recommendations. Next dialysis is scheduled for Monday.  - BL UE brachial vein thromboses:    - Unclear how subacute this is.  Agree with venography.  On my evaluation of her prior CXR's, I cannot see a cervical rib, but will need more formal eval for thoracic outlet obstruction after venography.    - Starting on an anticoagulation today for this based on Chest guidelines, after hypercoag studies drawn.    - Ultimately, if no source for thrombosis is found, may need eval for pro-thrombotic states, i.e. Malignancy.  She was not on OCPs at home.    [facility] 04/02/2013

## 2013-04-02 NOTE — Progress Notes (Signed)
Family Medicine Teaching Service Daily Progress Note Service Pager: [phone]  Subjective: Feels well. Tolerated dialysis well yesterday PM. Abd/pelvic pressure is improved after passing a lot of gas. Still has not had BM.  Objective: Temp:  [97.6 F (36.4 C)-99 F (37.2 C)] 99 F (37.2 C) (06/28 0908) Pulse Rate:  [76-104] 90 (06/28 0908) Resp:  [10-20] 18 (06/28 0908) BP: (119-160)/(66-98) 125/76 mmHg (06/28 0908) SpO2:  [99 %-100 %] 99 % (06/28 0908) Weight:  [149 lb 11.1 oz (67.9 kg)-153 lb 3.5 oz (69.5 kg)] 149 lb 11.1 oz (67.9 kg) (06/27 2114) Exam: General: NAD, pleasant, cooperative Cardiovascular: RRR, 2/6 systolic murmur again auscultated Respiratory: CTAB, NWOB Abdomen: soft, nontender to palpation Extremities: trace edema on bilateral lower shins. Bilateral hands mildly swollen. No redness or erythema in either upper extremity.  I have reviewed the patient's medications, labs, imaging, and diagnostic testing.  Notable results are summarized below.   Medications:  Scheduled Meds: . amLODipine  10 mg Oral Daily  . heparin  5,000 Units Subcutaneous Q8H  . insulin aspart  0-15 Units Subcutaneous TID WC  . insulin glargine  11 Units Subcutaneous QHS  . levothyroxine  50 mcg Oral QAC breakfast  . polyethylene glycol  17 g Oral TID  . senna-docusate  1 tablet Oral Daily  . sodium chloride  3 mL Intravenous Q12H   Continuous Infusions:  PRN Meds:.hydrALAZINE, ondansetron (ZOFRAN) IV  Labs:  CBC  Recent Labs Lab 03/30/13 1205 03/31/13 0515 04/01/13 0605  WBC 6.0 5.7 6.7  HGB 11.4* 10.6* 10.3*  HCT 31.5* 29.5* 28.8*  PLT 353 328 350     CMET  Recent Labs Lab 03/30/13 1205 03/31/13 0515 04/01/13 0605  NA 130* 129* 131*  K 2.9* 3.8 3.1*  CL 97 100 100  CO2 16* 17* 21  BUN 53* 49* 49*  CREATININE 6.87* 6.32* 7.23*  GLUCOSE 203* 260* 228*  CALCIUM 8.9 8.0* 8.2*  AST 20  --   --   ALT 20  --   --   ALKPHOS 61  --   --   PROT 6.4  --   --    ALBUMIN 1.8*  --   --       ANA negative  Imaging/Diagnostic Tests:  Renal Ultrasound: -Right Kidney: Normal in size and parenchymal echogenicity. No evidence of mass or hydronephrosis. 10.5 cm length.  -Left Kidney: Normal in size and parenchymal echogenicity. No evidence of mass or hydronephrosis. 9.0 cm length.  -Bladder: Appears normal for degree of bladder distention. Bilateral ureteral jets demonstrated.  IMPRESSION: Normal study.   Assessment/Plan:  [name] is a 32 y.o. year old female with a hx of T1DM, HTN, HLD presenting with acute (possibly on chronic) renal failure with creatinine markedly above baseline.   # ARF: creatinine 6.87 on admission, was 6.36 on 6/17, 1.4 in January 2013. ?diabetic nephropathy vs hypertensive nephropathy vs some acute process.  - cr increased to 7.23 yesterday, had dialysis access placed and got dialysis yesterday PM - monitor fluid status closely with strict I&O, daily weights  - renal u/s unremarkable, ANA negative, SPEP no M spike [ ]  f/u renal recs  # Branchial vein bilateral thrombosis: - will start coumadin today with heparin bridge (UPT negative)  # N/V:  - likely secondary to ARF  - IV zofran prn   # Constipation: Patient reported and seen on abdominal series. May be secondary to untreated hypothyroidism.  - s/p soap suds enema - no stool felt  during disimpaction per RN note - added sennakot and increased miralax yesterday to TID  # DM:  - currently on Lantus 11 units QHS  (normally does 12 u QAM at home as well) - moderate sliding scale, required 16 units SSI in last 24 hours with CBG's ranging from 74-254 - A1c 8.3 at recent PCP visit   # HTN: BP's improving - no longer ACE candidate with initiation of dialysis - continue amlodipine 10mg  with prn IV hydralazine - dialysis will also likely help with HTN [ ]  f/u BP's  # HLD:  - recently had lipids checked at PCP's office. Defer treatment of lipids to PCP.   #  Hypothyroidism:  - previous radionucleotide ablation from Graves Disease per EMR  - TSH 265 with low T3 & T4 so start synthroid 50 mcg daily today, titrate up [ ]  needs f/u TSH in 6 weeks  # FEN/GI:  - SLIV - carb modified diet  # Prophylaxis:  - heparin SQ   # Dispo:  - pending clinical improvement and renal recommendations  # Code Status: discussed with patient upon admission, she desires to be full code   Levert Feinstein, MD [facility] Medicine PGY-1 Service Pager [phone]

## 2013-04-03 LAB — CBC
Hemoglobin: 9.2 g/dL — ABNORMAL LOW (ref 12.0–15.0)
MCH: 29.4 pg (ref 26.0–34.0)
MCHC: 34.7 g/dL (ref 30.0–36.0)
RDW: 12.5 % (ref 11.5–15.5)

## 2013-04-03 LAB — BASIC METABOLIC PANEL
BUN: 40 mg/dL — ABNORMAL HIGH (ref 6–23)
Creatinine, Ser: 6.64 mg/dL — ABNORMAL HIGH (ref 0.50–1.10)
GFR calc non Af Amer: 8 mL/min — ABNORMAL LOW (ref >90)
Glucose, Bld: 231 mg/dL — ABNORMAL HIGH (ref 70–99)
Potassium: 3.2 mEq/L — ABNORMAL LOW (ref 3.5–5.1)

## 2013-04-03 LAB — GLUCOSE, CAPILLARY
Glucose-Capillary: 50 mg/dL — ABNORMAL LOW (ref 70–99)
Glucose-Capillary: 85 mg/dL (ref 70–99)
Glucose-Capillary: 315 mg/dL — ABNORMAL HIGH (ref 70–99)
Glucose-Capillary: 216 mg/dL — ABNORMAL HIGH (ref 70–99)

## 2013-04-03 LAB — HEMOGLOBIN A1C: Hgb A1c MFr Bld: 8.4 % — ABNORMAL HIGH (ref <5.7)

## 2013-04-03 LAB — PROTIME-INR: Prothrombin Time: 13.4 seconds (ref 11.6–15.2)

## 2013-04-03 MED ORDER — HEPARIN (PORCINE) IN NACL 100-0.45 UNIT/ML-% IJ SOLN
900.0000 [IU]/h | INTRAMUSCULAR | Status: DC
  Administered 2013-04-03 – 2013-04-06 (×4): 900 [IU]/h via INTRAVENOUS
  Filled 2013-04-03 (×4): qty 250

## 2013-04-03 MED ORDER — WARFARIN SODIUM 10 MG PO TABS
10.0000 mg | ORAL_TABLET | Freq: Once | ORAL | Status: AC
  Administered 2013-04-03: 10 mg via ORAL
  Filled 2013-04-03 (×2): qty 1

## 2013-04-03 MED ORDER — INSULIN GLARGINE 100 UNIT/ML ~~LOC~~ SOLN
12.0000 [IU] | Freq: Every day | SUBCUTANEOUS | Status: DC
  Administered 2013-04-03 – 2013-04-08 (×6): 12 [IU] via SUBCUTANEOUS
  Filled 2013-04-03 (×8): qty 0.12

## 2013-04-03 NOTE — Progress Notes (Signed)
MD returned call. Will come to see patient shortly.

## 2013-04-03 NOTE — Progress Notes (Signed)
1 New ESRD  2 Diabetes Mellitus w/ complications  3 Hypertension, improving control 4 Volume overload, better 5 Bilat upper extrem DVTs (brachial veins), now on warfarin  Rec: For venogram in AM to look at UEs.  Hopefully warfarin will not interfere with procedures, and needs AV access before discharge. HD Monday. CLIP    Subjective: Interval History: some swelling of tongue she reports this am and SOB with spontaneous resolution  Objective: Vital signs in last 24 hours: Temp:  [98.5 F (36.9 C)-98.8 F (37.1 C)] 98.7 F (37.1 C) (06/29 0954) Pulse Rate:  [74-90] 82 (06/29 0954) Resp:  [18] 18 (06/29 0954) BP: (127-151)/(73-87) 136/83 mmHg (06/29 0954) SpO2:  [100 %] 100 % (06/29 0954) Weight:  [67.767 kg (149 lb 6.4 oz)] 67.767 kg (149 lb 6.4 oz) (06/28 2230) Weight change: -1.733 kg (-3 lb 13.1 oz)  Intake/Output from previous day: 06/28 0701 - 06/29 0700 In: 136.6 [I.V.:116.6] Out: 350 [Urine:350] Intake/Output this shift: Total I/O In: 30 [P.O.:30] Out: 150 [Urine:150]  General appearance: alert and cooperative Resp: clear to auscultation bilaterally Chest wall: no tenderness Cardio: regular rate and rhythm, S1, S2 normal, no murmur, click, rub or gallop Extremities: edema 1+  Lab Results:  Recent Labs  04/02/13 1022 04/03/13 0116  WBC 6.0 7.4  HGB 10.7* 9.2*  HCT 30.1* 26.5*  PLT 287 244   BMET:  Recent Labs  04/02/13 1022 04/03/13 0116  NA 135 134*  K 3.7 3.2*  CL 100 100  CO2 24 24  GLUCOSE 317* 231*  BUN 31* 40*  CREATININE 5.98* 6.64*  CALCIUM 8.0* 7.7*   No results found for this basename: PTH,  in the last 72 hours Iron Studies: No results found for this basename: IRON, TIBC, TRANSFERRIN, FERRITIN,  in the last 72 hours Studies/Results: Dg Chest Port 1 View  04/01/2013   *RADIOLOGY REPORT*  Clinical Data: 32 year old female right IJ hemodialysis catheter placed.  PORTABLE CHEST - 1 VIEW  Comparison: 03/30/2013.  Findings: Semi upright AP  portable view 1229 hours.  Right IJ approach dual lumen dialysis type catheter in place.  No pneumothorax.  Catheter tips at the level of the SVC and cavoatrial junction.  Lower lung volumes.  No pulmonary edema, pleural effusion or confluent pulmonary opacity.  Stable cardiac size and mediastinal contours.  Visualized tracheal air column is within normal limits.  IMPRESSION: 1.  Right IJ dual lumen catheter placed with no pneumothorax or adverse features. 2.  Lower lung volumes.   Original Report Authenticated By: Erskine Speed, M.D.   Dg Fluoro Guide Cv Line-no Report  04/01/2013   CLINICAL DATA: dialysis catheter   FLOURO GUIDE CV LINE  Fluoroscopy was utilized by the requesting physician.  No radiographic  interpretation.     Scheduled: . amLODipine  10 mg Oral Daily  . insulin aspart  0-15 Units Subcutaneous TID WC  . insulin glargine  11 Units Subcutaneous QHS  . levothyroxine  50 mcg Oral QAC breakfast  . polyethylene glycol  17 g Oral TID  . senna-docusate  1 tablet Oral Daily  . sodium chloride  3 mL Intravenous Q12H  . warfarin  10 mg Oral ONCE-1800  . Warfarin - Pharmacist Dosing Inpatient   Does not apply q1800    LOS: 4 days   Katharyn Schauer C 04/03/2013,11:13 AM

## 2013-04-03 NOTE — Progress Notes (Signed)
Pt. complained of difficulty seeing.Pt. is blind but stated earlier she could see shapes but now everything is black.Pt. stated this has happened before and was told by MD she could be having a stroke. Paged MD and am awaiting return call.VS:[id number].

## 2013-04-03 NOTE — Progress Notes (Signed)
Family Medicine Teaching Service Daily Progress Note Intern Pager: [phone]  Patient name: [name] Medical record number: [id number] Date of birth: 07-Apr-1981 Age: 32 y.o. Gender: female  Primary Care Provider: No primary provider on file. Consultants: Nephrology Code Status: Full  Pt Overview and Major Events to Date:  04/01/13: found to have bilateral single brachial vein DVT's on vein mapping. 04/01/13: Right IJ HD cath placed and hemodialysis started  04/02/13: coumadin started with heparin bridge Assessment and Plan:  [name] is a 32 y.o. year old female with a hx of T1DM, HTN, HLD presenting with acute (likely on chronic) renal failure with creatinine markedly above baseline.   # ARF: creatinine 6.87 on admission, was 6.36 on 6/17, 1.4 in January 2013. ?diabetic nephropathy vs hypertensive nephropathy vs some acute process.  - Cr of 6.64 today. Had dialysis on 04/01/13.  - fluid status: negative 4.8L since admission with 2L removed during dialysis. Some edema on exam but not markedly so. Will follow renal recs regarding dialysis.  - follow daily weights and I/Os - renal u/s unremarkable, ANA negative, SPEP no M spike  [ ]  f/u renal recs  [ ]  venogram Monday am [ ]  AV access before discharge [ ] renal panel in am  # Branchial vein bilateral thrombosis:  - on heparin and coumadin bridge sine 04/02/13. Spoke with Dr. Darnelle Catalan with hematology/oncology who confirms that this is the appropriate treatment. If patient's family is unwilling to pursue heparin due to concern for fluid overload, lovenox may be possible, although it would require renal dosing per pharmacy. The new oral anticoagulants would not be best choice in this case as they would increase risk of bleeding, would not be well suited in ESRD and could not be easily reversed for future procedures such as HD fistula.  - INR: 1.04 today - antithrombin 3 and homocystein normal [ ]  f/u hypercoag panel  # Tongue and  neck swelling: no evidence of this on exam without any O2 requirement.  - follow up respiratory status  # N/V:  - likely secondary to ARF  - IV zofran prn   # DM:  - currently on Lantus 11 units QHS (normally does 12 u QAM at home as well). Increase to home dose - moderate sliding scale, required 20 units SSI in last 24 hours with CBG's ranging from 143-254  - A1c 8.3 at recent PCP visit   # HTN: BP's stable - no longer ACE candidate with initiation of dialysis  - continue amlodipine 10mg  with prn IV hydralazine  - dialysis will also likely help with HTN  [ ]  f/u BP's  # Hypokalemia: 3.2 today.  - correct per dialysis   # Constipation: Patient reported and seen on abdominal series. May be secondary to untreated hypothyroidism.  - s/p soap suds enema  - continue miralax tid and senna  # HLD:  - recently had lipids checked at PCP's office. Defer treatment of lipids to PCP.   # Hypothyroidism:  - previous radionucleotide ablation from Graves Disease per EMR  - TSH 265 with low T3 & T4: start synthroid 50 mcg daily started 04/02/13, titrate up  [ ]  needs f/u TSH in 6 weeks    # FEN/GI:  - SLIV  - carb modified diet  # Prophylaxis:  - heparin SQ  # Dispo:  - pending clinical improvement and renal recommendations  # Code Status: discussed with patient upon admission, she desires to be full code   Disposition:  Pending improvement  Subjective:  Feeling of tongue swelling overnight with feeling of throat swelling as well. Upper respiratory snoring and wheezing sounds overnight. No new O2 requirements.   Patient's partner is concerned about the heparin adding on too much fluid.   Objective: Temp:  [98.5 F (36.9 C)-98.8 F (37.1 C)] 98.7 F (37.1 C) (06/29 0954) Pulse Rate:  [74-90] 82 (06/29 0954) Resp:  [18] 18 (06/29 0954) BP: (127-151)/(73-87) 136/83 mmHg (06/29 0954) SpO2:  [100 %] 100 % (06/29 0954) Weight:  [149 lb 6.4 oz (67.767 kg)] 149 lb 6.4 oz (67.767 kg)  (06/28 2230) Physical Exam: General: no acute distress, sitting on edge of bed, A+Ox4 Cardiovascular: S1S2, RRR, 2/6 systolic murmur best heard at left and right upper sternal borders Respiratory: normal work of breathing, no wheezing, no crackles Extremities: non pitting edema Oropharynx: clear, no angioedema, no marked tongue swelling. Congested and erythematous nasal turbinates  Laboratory:  Recent Labs Lab 04/01/13 0605 04/02/13 1022 04/03/13 0116  WBC 6.7 6.0 7.4  HGB 10.3* 10.7* 9.2*  HCT 28.8* 30.1* 26.5*  PLT 350 287 244    Recent Labs Lab 03/30/13 1205  04/01/13 0605 04/02/13 1022 04/03/13 0116  NA 130*  < > 131* 135 134*  K 2.9*  < > 3.1* 3.7 3.2*  CL 97  < > 100 100 100  CO2 16*  < > 21 24 24   BUN 53*  < > 49* 31* 40*  CREATININE 6.87*  < > 7.23* 5.98* 6.64*  CALCIUM 8.9  < > 8.2* 8.0* 7.7*  PROT 6.4  --   --   --   --   BILITOT 0.3  --   --   --   --   ALKPHOS 61  --   --   --   --   ALT 20  --   --   --   --   AST 20  --   --   --   --   GLUCOSE 203*  < > 228* 317* 231*  < > = values in this interval not displayed.  Antithrombin III: 110% (normal) Homocystein: 9.8 (normal) Protein C, S, factor V, lupus anticoag: pending  Imaging/Diagnostic Tests:  Renal Ultrasound:  -Right Kidney: Normal in size and parenchymal echogenicity. No evidence of mass or hydronephrosis. 10.5 cm length.  -Left Kidney: Normal in size and parenchymal echogenicity. No evidence of mass or hydronephrosis. 9.0 cm length.  -Bladder: Appears normal for degree of bladder distention. Bilateral ureteral jets demonstrated.  IMPRESSION: Normal study.  Right upper extremity vein map: 04/01/13 Unable to visualize the left cephalic vein in its entirety due to IV placement.  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    [name], MD 04/03/2013, 10:03 AM PGY-2, [facility] FPTS Intern pager: [phone] Amion password: [email or web address], text pages  welcome

## 2013-04-03 NOTE — Progress Notes (Signed)
Family Medicine Teaching Service Interim Note:  Team was called regarding sudden onset of change in vision in her right eye. 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 She is blind in right eye but used to have some color perception and movement perception. On Mother's day, she had sudden change in vision with sudden darkened vision. She was seen by her retinologist a few days later and was told she may have had embolus. She regained some perception of movement but not colors.  Today, at 4pm she woke up from a nap with complete blackened vision, without any color or movement perception. She denies any weakness, any slurred speech.   On exam, there are no focal deficits with normal upper and lower extremity strength. She has conserved eye movement in right eye.  Fundoscope exam was limited due to undialated pupil.   31 yo with type 1 diabetes and recent acute (?on chronic) renal failure and bilateral brachial DVT's who now presents with right eye blindness - spoke with Dr. Roseanne Reno with neurology who recommends consult to ophthalmology since this appears to be an opthalmology issue.  - consulted ophthalmology and awaiting call.  - patient and partner updated at bedside.   Marena Chancy, PGY-2 Family Medicine Resident

## 2013-04-03 NOTE — Progress Notes (Signed)
Family Medicine Teaching Service Interim Progress Note  Spoke with Dr. Luciana Axe with Ophthalmology who has seen patient in the outpatient setting. He states that when seen previously, she was diagnosed with Quiescent Proliferative Diabetic Retinopathy and he thinks this may be what is happening again. With nonperfusion of optic nerve, she may have had a partial recovery since then but this is likely the same process occurring again, for which there is no medical, laser, or surgical treatment. He recommends she follow-up as an outpatient in his office or with [facility], where he thinks she has been seen in the past as well.  Updating patient and partner at bedside.  [name] 04/03/2013 7:22 PM

## 2013-04-03 NOTE — Progress Notes (Signed)
ANTICOAGULATION CONSULT NOTE   Pharmacy Consult for Heparin  Indication: Bilateral Upper Extremity DVTs  No Known Allergies  Patient Measurements: Height: 5' 2.5" (158.8 cm) Weight: 149 lb 6.4 oz (67.767 kg) IBW/kg (Calculated) : 51.25 Heparin Dosing Weight: 65 kg  Vital Signs: Temp: 98.7 F (37.1 C) (06/29 0954) Temp src: Oral (06/29 0954) BP: 136/83 mmHg (06/29 0954) Pulse Rate: 82 (06/29 0954)  Labs:  Recent Labs  04/01/13 0605 04/02/13 1022 04/02/13 1340 04/03/13 0116 04/03/13 1213  HGB 10.3* 10.7*  --  9.2*  --   HCT 28.8* 30.1*  --  26.5*  --   PLT 350 287  --  244  --   LABPROT  --   --  13.1 13.4  --   INR  --   --  1.01 1.04  --   HEPARINUNFRC  --   --   --  0.50 1.19*  CREATININE 7.23* 5.98*  --  6.64*  --    Estimated Creatinine Clearance: 11.4 ml/min (by C-G formula based on Cr of 6.64).  Medical History: Past Medical History  Diagnosis Date  . Diabetes mellitus without complication   . Blind   . Hypothyroidism    Assessment: 31 YO female with bilateral brachial artery thrombosis for anticoagulation. Heparin level this PM is elevated at 1.19 on 1100 units/hr (did receive 5000 units sq prior to starting heparin drip on 6/28)- This level is up from 0.5 prior on same dose. Plan for venogram on Monday. Talked to phlebotomy- He drew on left arm and IV running in right arm. He states patient was not a hard stick.   Goal of Therapy:  Heparin 0.3-0.7 Monitor platelets by anticoagulation protocol: Yes   Plan:  Hold heparin x1 hour and then decrease rate to 900 units/hr. Recheck level in 8 hours after restart  Daily heparin level and CBC while on therapy   Link Snuffer, PharmD, BCPS Clinical Pharmacist [phone] 04/03/2013,1:16 PM

## 2013-04-03 NOTE — Progress Notes (Signed)
ANTICOAGULATION CONSULT NOTE   Pharmacy Consult for Heparin and Coumadin Indication: Bilateral Upper Extremity DVTs  No Known Allergies  Patient Measurements: Height: 5' 2.5" (158.8 cm) Weight: 149 lb 6.4 oz (67.767 kg) IBW/kg (Calculated) : 51.25 Heparin Dosing Weight: 65 kg  Vital Signs: Temp: 98.6 F (37 C) (06/28 2230) Temp src: Oral (06/28 2230) BP: 136/80 mmHg (06/28 2230) Pulse Rate: 74 (06/28 2230)  Labs:  Recent Labs  04/01/13 0605 04/02/13 1022 04/02/13 1340 04/03/13 0116  HGB 10.3* 10.7*  --  9.2*  HCT 28.8* 30.1*  --  26.5*  PLT 350 287  --  244  LABPROT  --   --  13.1 13.4  INR  --   --  1.01 1.04  HEPARINUNFRC  --   --   --  0.50  CREATININE 7.23* 5.98*  --  6.64*   Estimated Creatinine Clearance: 11.4 ml/min (by C-G formula based on Cr of 6.64).  Medical History: Past Medical History  Diagnosis Date  . Diabetes mellitus without complication   . Blind   . Hypothyroidism    Assessment: 32 YO female with bilateral brachial artery thrombosis for anticoagulation  Goal of Therapy:  INR 2-3 Heparin level 0.3-0.7 units/ml Monitor platelets by anticoagulation protocol: Yes   Plan:  Continue Heparin at current rate  Recheck level in 8 hours to verify Coumadin 10 mg today  Geannie Risen, PharmD, BCPS  04/03/2013,3:36 AM

## 2013-04-03 NOTE — Progress Notes (Signed)
FMTS Attending Daily Note:  Renold Don MD  [phone] pager  Family Practice pager:  [phone] I have seen and examined this patient and have reviewed their chart. I have discussed this patient with the resident. I agree with the resident's findings, assessment and care plan.  Additionally: - Long conversation with patient.  Complained of "feeling volume overloaded" last night with throat swelling/arm swelling.  However this has now resolved.  No trouble with dyspnea last night - She and her partner were upset over lack of explanation for heparin, Coumadin usage, how long they would be on these medications - Explained clot burden, length of anticoagulation, etc.  Both patient and partner satisfied with conversation - For venography tomorrow. Renal would like access placed prior to DC - Question for vascular surgeon would be if warfarin will interfere with procedure.  Will touch base with them today.  Nowhere near therapeutic as of yet, but if procedure not scheduled until later this week, may need to continue only heparin with eye towards restarting Coumadin after procedure.   Tobey Grim, MD 04/03/2013 3:35 PM

## 2013-04-03 NOTE — Progress Notes (Signed)
For bilateral venogram tomorrow 

## 2013-04-04 ENCOUNTER — Encounter (HOSPITAL_COMMUNITY)
Admission: EM | Disposition: A | Discharge status: Home / Self Care | Payer: Self-pay | Source: Home / Self Care | Attending: Family Medicine

## 2013-04-04 ENCOUNTER — Encounter (HOSPITAL_COMMUNITY): Payer: Self-pay | Admitting: Vascular Surgery

## 2013-04-04 DIAGNOSIS — E872 Acidosis, unspecified: Secondary | ICD-10-CM | POA: Diagnosis not present

## 2013-04-04 DIAGNOSIS — I82629 Acute embolism and thrombosis of deep veins of unspecified upper extremity: Secondary | ICD-10-CM

## 2013-04-04 DIAGNOSIS — I120 Hypertensive chronic kidney disease with stage 5 chronic kidney disease or end stage renal disease: Secondary | ICD-10-CM | POA: Diagnosis not present

## 2013-04-04 DIAGNOSIS — E109 Type 1 diabetes mellitus without complications: Secondary | ICD-10-CM

## 2013-04-04 DIAGNOSIS — N186 End stage renal disease: Secondary | ICD-10-CM

## 2013-04-04 HISTORY — PX: VENOGRAM: SHX5497

## 2013-04-04 LAB — BASIC METABOLIC PANEL
BUN: 43 mg/dL — ABNORMAL HIGH (ref 6–23)
Creatinine, Ser: 7.37 mg/dL — ABNORMAL HIGH (ref 0.50–1.10)
GFR calc Af Amer: 8 mL/min — ABNORMAL LOW (ref >90)
GFR calc non Af Amer: 7 mL/min — ABNORMAL LOW (ref >90)
Glucose, Bld: 100 mg/dL — ABNORMAL HIGH (ref 70–99)

## 2013-04-04 LAB — CBC
HCT: 27 % — ABNORMAL LOW (ref 36.0–46.0)
Hemoglobin: 9.3 g/dL — ABNORMAL LOW (ref 12.0–15.0)
MCH: 29.5 pg (ref 26.0–34.0)
MCHC: 34.4 g/dL (ref 30.0–36.0)
RDW: 12.6 % (ref 11.5–15.5)

## 2013-04-04 LAB — PROTEIN C ACTIVITY: Protein C Activity: 200 % — ABNORMAL HIGH (ref 75–133)

## 2013-04-04 LAB — GLUCOSE, CAPILLARY
Glucose-Capillary: 127 mg/dL — ABNORMAL HIGH (ref 70–99)
Glucose-Capillary: 146 mg/dL — ABNORMAL HIGH (ref 70–99)
Glucose-Capillary: 164 mg/dL — ABNORMAL HIGH (ref 70–99)
Glucose-Capillary: 151 mg/dL — ABNORMAL HIGH (ref 70–99)

## 2013-04-04 LAB — HEPATITIS B SURFACE ANTIBODY,QUALITATIVE: Hep B S Ab: NONREACTIVE

## 2013-04-04 LAB — PROTEIN C, TOTAL: Protein C, Total: 136 % (ref 72–160)

## 2013-04-04 LAB — PROTHROMBIN GENE MUTATION

## 2013-04-04 LAB — LUPUS ANTICOAGULANT PANEL
PTT Lupus Anticoagulant: 47.3 secs — ABNORMAL HIGH (ref 28.0–43.0)
dRVVT Incubated 1:1 Mix: 35.6 secs (ref <42.9)

## 2013-04-04 LAB — PROTIME-INR: INR: 1.32 (ref 0.00–1.49)

## 2013-04-04 LAB — BETA-2-GLYCOPROTEIN I ABS, IGG/M/A
Beta-2 Glyco I IgG: 0 G Units (ref <20)
Beta-2-Glycoprotein I IgA: 0 A Units (ref <20)
Beta-2-Glycoprotein I IgM: 24 M Units — ABNORMAL HIGH (ref <20)

## 2013-04-04 LAB — PROTEIN S ACTIVITY: Protein S Activity: 89 % (ref 69–129)

## 2013-04-04 SURGERY — VENOGRAM
Anesthesia: LOCAL | Laterality: Bilateral

## 2013-04-04 MED ORDER — WARFARIN SODIUM 7.5 MG PO TABS
7.5000 mg | ORAL_TABLET | Freq: Once | ORAL | Status: AC
  Administered 2013-04-04: 7.5 mg via ORAL
  Filled 2013-04-04: qty 1

## 2013-04-04 MED ORDER — RENA-VITE PO TABS
1.0000 | ORAL_TABLET | Freq: Every day | ORAL | Status: DC
  Administered 2013-04-04 – 2013-04-08 (×5): 1 via ORAL
  Filled 2013-04-04 (×8): qty 1

## 2013-04-04 MED ORDER — AMLODIPINE BESYLATE 10 MG PO TABS
10.0000 mg | ORAL_TABLET | Freq: Every day | ORAL | Status: DC
  Administered 2013-04-05 – 2013-04-08 (×4): 10 mg via ORAL
  Filled 2013-04-04 (×5): qty 1

## 2013-04-04 NOTE — H&P (View-Only) (Signed)
For bilateral venogram tomorrow

## 2013-04-04 NOTE — Progress Notes (Signed)
Subjective: Interval History: has complaints Nausea.  Objective: Vital signs in last 24 hours: Temp:  [98.6 F (37 C)-99.1 F (37.3 C)] 98.6 F (37 C) (06/30 0910) Pulse Rate:  [78-90] 80 (06/30 0910) Resp:  [18] 18 (06/30 0910) BP: (136-149)/(83-92) 148/92 mmHg (06/30 0910) SpO2:  [100 %] 100 % (06/30 0910) Weight:  [68.765 kg (151 lb 9.6 oz)] 68.765 kg (151 lb 9.6 oz) (06/29 2114) Weight change: 0.998 kg (2 lb 3.2 oz)  Intake/Output from previous day: 06/29 0701 - 06/30 0700 In: 409 [P.O.:210; I.V.:199] Out: 500 [Urine:400; Stool:100] Intake/Output this shift:    General appearance: alert, cooperative and no distress Resp: clear to auscultation bilaterally Cardio: S1, S2 normal and systolic murmur: holosystolic 2/6, blowing at apex GI: pos bs,soft,nontender Extremities: extremities normal, atraumatic, no cyanosis or edema  Lab Results:  Recent Labs  04/03/13 0116 04/04/13 0600  WBC 7.4 6.9  HGB 9.2* 9.3*  HCT 26.5* 27.0*  PLT 244 245   BMET:  Recent Labs  04/03/13 0116 04/04/13 0600  NA 134* 139  K 3.2* 3.5  CL 100 106  CO2 24 24  GLUCOSE 231* 100*  BUN 40* 43*  CREATININE 6.64* 7.37*  CALCIUM 7.7* 8.3*   No results found for this basename: PTH,  in the last 72 hours Iron Studies: No results found for this basename: IRON, TIBC, TRANSFERRIN, FERRITIN,  in the last 72 hours  Studies/Results: No results found.  I have reviewed the patient's current medications.  Assessment/Plan: 1 CRF for HD. Vol xs mild with ^ bp, gradually lower. Slow ^ time 3 HTN meds use hs 3 Anemia check Fe give epo 4 DM controlle 5 Access for venogram P HD, venogram, epo check labs.    LOS: 5 days   Brayden Betters L 04/04/2013,9:51 AM

## 2013-04-04 NOTE — Progress Notes (Signed)
Family Medicine Teaching Service Attending Note  I discussed patient [name]  with Dr. Pollie Meyer and reviewed their note for today.  I agree with their assessment and plan.

## 2013-04-04 NOTE — Progress Notes (Signed)
Patient states she is having left sided sharp nerve pain. States "it feels like a jolt in my wrist, especially when I move it or when they draw blood." Patient is in no pain. MD was notified. Will continue to monitor.

## 2013-04-04 NOTE — Progress Notes (Signed)
First visit with pt as per charge nurse's referral. I introduced myself and my role to the pt. Pt stated that she is doing well and is handling dialysis well. Pt stated that she had handled the loss of her vision a few years ago, and that dialysis is yet another additional major life transition in her life. Pt discussed how dialysis will impact her life. Pt stated that she is independent and has support from a friend who comes by frequently. Pt determined that she does not require any additional support.  Sherol Dade Counselor Intern Haroldine Laws

## 2013-04-04 NOTE — Progress Notes (Signed)
ANTICOAGULATION CONSULT NOTE - Follow Up Consult  Pharmacy Consult for heparin and coumadin Indication: Bilateral Upper Extremity DVTs   No Known Allergies  Patient Measurements: Height: 5' 2.5" (158.8 cm) Weight: 151 lb 9.6 oz (68.765 kg) IBW/kg (Calculated) : 51.25 Heparin Dosing Weight: 65 kg  Vital Signs: Temp: 98.6 F (37 C) (06/30 0910) Temp src: Oral (06/30 0910) BP: 148/92 mmHg (06/30 0910) Pulse Rate: 80 (06/30 0910)  Labs:  Recent Labs  04/02/13 1022 04/02/13 1340  04/03/13 0116 04/03/13 1213 04/04/13 0046 04/04/13 0600  HGB 10.7*  --   --  9.2*  --   --  9.3*  HCT 30.1*  --   --  26.5*  --   --  27.0*  PLT 287  --   --  244  --   --  245  LABPROT  --  13.1  --  13.4  --   --  16.1*  INR  --  1.01  --  1.04  --   --  1.32  HEPARINUNFRC  --   --   < > 0.50 1.19* 0.45 0.55  CREATININE 5.98*  --   --  6.64*  --   --  7.37*  < > = values in this interval not displayed.  Estimated Creatinine Clearance: 10.4 ml/min (by C-G formula based on Cr of 7.37).   Assessment: Patient is a 32 y.o F on anticoagulation overlap day #3 of 5 days minimum for UE DVTs.  Heparin level is at goal with 0.48 this morning and INR is subtheapeutic but is trending up.  No bleeding documented.  Goal of Therapy:  Heparin level 0.3-0.7 units/ml Monitor platelets by anticoagulation protocol: Yes   Plan:  1) continue current heparin rate 2) coumadin 7.5mg  PO x1   Jaysha Lasure P 04/04/2013,10:38 AM

## 2013-04-04 NOTE — Op Note (Signed)
OPERATIVE REPORT  DATE OF SURGERY: 04/04/2013  PATIENT: [name], 32 y.o. female MRN: [id number]  DOB: [birth year]  PRE-OPERATIVE DIAGNOSIS: End-stage renal disease with need for permanent access  POST-OPERATIVE DIAGNOSIS:  Same  PROCEDURE: Bilateral upper extremity and central venograms  SURGEON:  Gretta Began, M.D.    ANESTHESIA:  None  EBL: None ml     BLOOD ADMINISTERED: None  DRAINS: None  SPECIMEN: None  COUNTS CORRECT:  YES  PLAN OF CARE: Return to the floor   PATIENT DISPOSITION:  PACU - hemodynamically stable  PROCEDURE DETAILS: The patient was taken to the peripheral vascular [facility] cath lab and placed supine position. She had difficulty with access and had a IV placed in the palm of her left hand and in the mid forearm on the right. Hand injection bilateral upper extremity venograms were obtained with subtraction views. On the left arm there was opacification from a branch into the brachial vein at the antecubital space. The vein was small but was patent throughout the course to include the subclavian vein and innominate vein. There was no evidence of clot in the brachial vein  The left arm venogram showed a venous structures throughout the arm. Again the veins were small but the brachial vein was patent with no evidence of thrombus. This was a patent through the axilla subclavian and innominate vein on the left as well. There were no immediate complications and the patient was transferred back to the floor  Findings bilateral upper extremity venograms with no evidence of thrombus or occlusion. Patent central veins bilaterally.   Gretta Began, M.D. 04/04/2013 3:00 PM

## 2013-04-04 NOTE — Progress Notes (Signed)
ANTICOAGULATION CONSULT NOTE   Pharmacy Consult for Heparin  Indication: Bilateral Upper Extremity DVTs  No Known Allergies  Patient Measurements: Height: 5' 2.5" (158.8 cm) Weight: 151 lb 9.6 oz (68.765 kg) IBW/kg (Calculated) : 51.25 Heparin Dosing Weight: 65 kg  Vital Signs: Temp: 98.7 F (37.1 C) (06/29 2114) Temp src: Oral (06/29 2114) BP: 149/86 mmHg (06/29 2114) Pulse Rate: 86 (06/29 2114)  Labs:  Recent Labs  04/01/13 0605 04/02/13 1022 04/02/13 1340 04/03/13 0116 04/03/13 1213 04/04/13 0046  HGB 10.3* 10.7*  --  9.2*  --   --   HCT 28.8* 30.1*  --  26.5*  --   --   PLT 350 287  --  244  --   --   LABPROT  --   --  13.1 13.4  --   --   INR  --   --  1.01 1.04  --   --   HEPARINUNFRC  --   --   --  0.50 1.19* 0.45  CREATININE 7.23* 5.98*  --  6.64*  --   --    Estimated Creatinine Clearance: 11.5 ml/min (by C-G formula based on Cr of 6.64).  Medical History: Past Medical History  Diagnosis Date  . Diabetes mellitus without complication   . Blind   . Hypothyroidism    Assessment: 32 YO female with bilateral brachial artery thrombosis for anticoagulation. Heparin level (0.45) is at-goal on 900 units/hr.   Goal of Therapy:  Heparin level 0.3-0.7 Monitor platelets by anticoagulation protocol: Yes   Plan:  1. Continue IV heparin at 900 units/hr.  2. Heparin level in 8 hours to confirm dosing.   Lorre Munroe, PharmD  04/04/2013,2:07 AM

## 2013-04-04 NOTE — Interval H&P Note (Signed)
History and Physical Interval Note:  04/04/2013 1:34 PM  [name]  has presented today for surgery, with the diagnosis of a  The various methods of treatment have been discussed with the patient and family. After consideration of risks, benefits and other options for treatment, the patient has consented to  Procedure(s): VENOGRAM (N/A) as a surgical intervention .  The patient's history has been reviewed, patient examined, no change in status, stable for surgery.  I have reviewed the patient's chart and labs.  Questions were answered to the patient's satisfaction.     Vail Basista

## 2013-04-04 NOTE — Discharge Summary (Signed)
REFRESH AND UPDATE PRIOR TO SIGNING  Family Medicine Teaching [facility] Discharge Summary  Patient name: [name] Medical record number: [id number] Date of birth: 1981/02/17 Age: 32 y.o. Gender: female Date of Admission: 03/30/2013  Date of Discharge: 04/09/13 Admitting Physician: Tobey Grim, MD  Primary Care Provider: [facility] Consultants: nephrology, vascular surgery  Indication for Hospitalization: newly diagnosed end stage renal disease  Discharge Diagnoses/Problem List:  1. End stage renal disease, newly diagnosed 2. Bilateral brachial vein DVT's 3. Tongue and neck swelling 4. Nausea/vomiting 5. Diabetes mellitus, insulin-dependent 6. Blindness with acute worsening of vision loss 7. Hypertension 8. Hypokalemia 9. Constipation 10. Hyperlipidemia 11. Hypothyroidism  Disposition: home  Discharge Condition: stable  Brief Hospital Course:   [name] is a 32 y.o. year old female with a hx of T1DM, HTN, HLD admitted to the hospital after presenting with acute (likely on chronic) renal failure with creatinine markedly above baseline.   # Renal failure:  Pt presented due to having elevated creatinine of 6.36 at PCP visit on 6/17. Creatinine 6.87 on admission. Last known creatinine prior to June 2014 was 1.4 in January 2013. Nephrology was consulted and believed this was most likely ESRD from diabetes mellitus. ANA, SPEP, and renal ultrasound were unremarkable. Vascular surgery placed a temporary dialysis catheter, and patient began dialysis on 6/27. Patient had a right thigh AV graft performed on 7/3 to establish permanent dialysis access. She was also set up for outpatient dialysis at [facility] MWF 2nd shift, per case management.    # Branchial vein bilateral thrombosis:  An initial vein map which was done showed bilateral upper extremity DVT's in the brachial veins. Patient was put on a heparin bridge to coumadin starting on  04/02/13 and a hypercoagubility workup was done (see results below). A venogram on 6/30 showed no upper extremity DVT's (see below). On 7/1, we spoke with vascular surgery who advised that this study did not effectively rule out DVT, and that HD access could not be placed in that arm for 3 months. This then indicated the placement of permanent access in the right lower extremity.    # DM: Inpatient glucose control was difficult. CBG's ranged from 62 to >600 given Lantus 12 units qHS and sliding scale insulin. A1c was checked and was 8.4. Discharge insulin regimen is same as previous regimen with strong recommendation to follow up with PCP. Pt was also discharged with prescriptions for Prodigy Voice glucometer, lancets, and test strips.   # Tongue and neck swelling: reported on 6/29, complained of this again on 7/2 during a hyperglycemic episode, though vital signs and exam remained normal.   # Vision loss: has hx of blindness x 6 years. On 6/29 noted complete loss of vision in R eye (previously had been able to see light only). Our team spoke with pt's outpatient ophthalmologist on 6/29, who stated that pt had been diagnosed with Quiescent Proliferative Diabetic Retinopathy and that this was likely continuation of this process, with nothing further to do. Recommended outpatient f/u.  # N/V: likely secondary to ARF, given IV zofran prn  # HTN: Held home ACE due to renal failure. Was started on amlodipine 10mg  daily with improvement in hypertension. Pt was discharged on home regimen with one change: Lisinopril-HCTZ was changed to HCTZ 12.5mg  alone, given new diagnosis of ESRD.   # Constipation: Patient reported on admission and seen on abdominal series. May have been secondary to untreated hypothyroidism. We gave her  a soap suds enema, miralax TID and senna. Reported resolution of this problem prior to discharge.  # HLD:  Recently had lipids checked at PCP's office. We deferred treatment of lipids to PCP.    # Hypothyroidism: s/p previous radionucleotide ablation from Graves Disease per EMR. TSH was checked and was 265 with low T3 & T4. Synthroid daily was started on 04/02/13. Recommend f/u TSH in 6 weeks.   Issues for Follow Up:  - needs to f/u with outpatient dialysis center, including INR checks for coumadin - recommend treatment of hyperlipidemia per PCP - needs TSH recheck in 6 weeks (synthroid daily started on 04/02/13) - f/u BP's and insulin requirement as an outpatient - recommend outpatient f/u with Dr. Luciana Axe (ophthalmology) for vision loss  Significant Procedures:  - vein mapping on 6/27 - veinogram on 6/30 - insertion of hemodialysis catheter on 6/27 - dialysis initiated on 6/27 - AVG placed on 7/3  Significant Labs and Imaging:   Recent Labs Lab 04/06/13 0721 04/07/13 0645 04/08/13 1300  WBC 6.1 6.7 6.7  HGB 9.4* 8.9* 8.0*  HCT 27.1* 25.2* 23.3*  PLT 212 138* 144*    Recent Labs Lab 04/03/13 0116 04/04/13 0600 04/04/13 1745 04/06/13 0721 04/07/13 0645 04/08/13 1348  NA 134* 139  --  135 132* 131*  K 3.2* 3.5  --  3.6 4.2 3.8  CL 100 106  --  100 96 98  CO2 24 24  --  23 21 23   GLUCOSE 231* 100*  --  174* 269* 309*  BUN 40* 43*  --  29* 25* 28*  CREATININE 6.64* 7.37*  --  6.07* 5.22* 6.15*  CALCIUM 7.7* 8.3* 7.7* 8.3* 8.4 7.6*  PHOS  --   --   --  5.0*  --  3.9  ALKPHOS  --   --   --  52  --   --   AST  --   --   --  19  --   --   ALT  --   --   --  10  --   --   ALBUMIN  --   --   --  1.7*  --  1.6*   Hgb A1c 8.4  Antithrombin III: 110% (normal)  Homocystein: 9.8 (normal)  Protein C activity 200% (Nl: 75-133%) Protein S activity 89% (wnl) factor V leiden mutation: neg. beta 2 glycoprotein IgG: 0, IgA: 0, IgM: 24 (nl <20) prothrombin gene mutation: neg. Anti-cardiolipin Ab: neg.  Renal Ultrasound:  -Right Kidney: Normal in size and parenchymal echogenicity. No evidence of mass or hydronephrosis. 10.5 cm length.  -Left Kidney:  Normal in size and parenchymal echogenicity. No evidence of mass or hydronephrosis. 9.0 cm length.  -Bladder: Appears normal for degree of bladder distention. Bilateral ureteral jets demonstrated.  IMPRESSION: Normal study.   Right upper extremity vein map: 04/01/13  Unable to visualize the left cephalic vein in its entirety due to IV placement.  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   Echocardiogram: 04/06/13: Normal LV size and systolic function with mild LV hypertrophy. EF 55-60%. Normal RV size and systolic function. No significant valvular abnormalities.  Outstanding Results: None  Discharge Medications:    Medication List    ASK your doctor about these medications       azithromycin 250 MG tablet  Commonly known as:  ZITHROMAX  Take 250-500 mg by mouth See admin instructions. Take two tablet by mouth today, then take 1 tablet daily  for 4 days     benzonatate 100 MG capsule  Commonly known as:  TESSALON  Take 100-200 mg by mouth every 8 (eight) hours as needed for cough.     fish oil-omega-3 fatty acids 1000 MG capsule  Take 1 g by mouth daily.     Garlic 500 MG Caps  Take 1 capsule by mouth daily.     insulin aspart 100 UNIT/ML injection  Commonly known as:  novoLOG  Inject 5-10 Units into the skin See admin instructions. Titration of additional 1 unit per every 50 spike in blood sugar level max of 200     insulin glargine 100 UNIT/ML injection  Commonly known as:  LANTUS  Inject 25 Units into the skin 2 (two) times daily.     lisinopril-hydrochlorothiazide 10-12.5 MG per tablet  Commonly known as:  PRINZIDE,ZESTORETIC  Take 1 tablet by mouth daily.     MULTI-VITAMIN DAILY PO  Take 1 tablet by mouth daily.        Discharge Instructions: Please refer to Patient Instructions section of EMR for full details.  Patient was counseled important signs and symptoms that should prompt return to medical care, changes in medications, dietary  instructions, activity restrictions, and follow up appointments.   Follow-Up Appointments:  Pt prefers to follow up with myself at [facility]   She will receive outpatient dialysis at the [facility] on Monday, 04/11/13 per Case Management.    Follow-up Information   Follow up with DICKSON,CHRISTOPHER S, MD In 2 weeks. (Office will call you to arrange your appt (sent))    Contact information:   [address] [place] [address] [phone]      Susan Rossetti B. Jarvis Newcomer, MD PGY-1, [facility] 04/08/2013 7:52 PM

## 2013-04-04 NOTE — Interval H&P Note (Signed)
History and Physical Interval Note:  04/04/2013 1:33 PM  [name]  has presented today for surgery, with the diagnosis of a  The various methods of treatment have been discussed with the patient and family. After consideration of risks, benefits and other options for treatment, the patient has consented to  Procedure(s): VENOGRAM (N/A) as a surgical intervention .  The patient's history has been reviewed, patient examined, no change in status, stable for surgery.  I have reviewed the patient's chart and labs.  Questions were answered to the patient's satisfaction.     Adonia Porada

## 2013-04-04 NOTE — H&P (View-Only) (Signed)
[facility]  Subjective  - POD #1  Diatek placed yesterday VEin Mapping done C/o swelling in left arm   Physical Exam:  Palpable radial pulses Bilateral Left arm in more edematous than right       Assessment/Plan:  POD #1  Vein map shows thrombus in bilateral brachial veins.  Will defer to medical service as to treat this with anticoagulation.  Patient will need bilateral venogram before access plan finalized.  This can be done on Monday.  With brachial vein thrombosis bilaterally, pt may want to consider PD if she is a candidate.  Lleyton Byers IV, V. WELLS 04/02/2013 6:51 AM --  Filed Vitals:   04/02/13 0528  BP: 124/76  Pulse: 87  Temp: 98.3 F (36.8 C)  Resp: 18    Intake/Output Summary (Last 24 hours) at 04/02/13 0651 Last data filed at 04/02/13 0529  Gross per 24 hour  Intake    560 ml  Output   3950 ml  Net  -3390 ml     Laboratory CBC    Component Value Date/Time   WBC 6.7 04/01/2013 0605   HGB 10.3* 04/01/2013 0605   HCT 28.8* 04/01/2013 0605   PLT 350 04/01/2013 0605    BMET    Component Value Date/Time   NA 131* 04/01/2013 0605   K 3.1* 04/01/2013 0605   CL 100 04/01/2013 0605   CO2 21 04/01/2013 0605   GLUCOSE 228* 04/01/2013 0605   BUN 49* 04/01/2013 0605   CREATININE 7.23* 04/01/2013 0605   CALCIUM 8.2* 04/01/2013 0605   GFRNONAA 7* 04/01/2013 0605   GFRAA 8* 04/01/2013 0605    COAG No results found for this basename: INR, PROTIME   No results found for this basename: PTT    Antibiotics Anti-infectives   Start     Dose/Rate Route Frequency Ordered Stop   04/01/13 1000  ceFAZolin (ANCEF) IVPB 2 g/50 mL premix     2 g 100 mL/hr over 30 Minutes Intravenous On call 04/01/13 0945 04/01/13 1141   04/01/13 0945  ceFAZolin (ANCEF) IVPB 1 g/50 mL premix  Status:  Discontinued    Comments:  Send with pt to OR   1 g 100 mL/hr over 30 Minutes Intravenous On call 04/01/13 0943 04/01/13 0944       V. Wells Leisl Spurrier  IV, M.D. [facility] [place] Office: [phone] Pager:  [phone]   

## 2013-04-04 NOTE — Progress Notes (Signed)
Family Medicine Teaching Service Daily Progress Note Intern Pager: [phone]  Patient name: [name] Medical record number: [id number] Date of birth: [birth year] Age: 32 y.o. Gender: female  Primary Care Provider: [facility] Consultants: Nephrology, Vascular surgery Code Status: Full code (per discussion with patient upon admission)  Pt Overview and Major Events to Date:  04/01/13: found to have bilateral single brachial vein DVT's on vein mapping. 04/01/13: Right IJ HD cath placed and hemodialysis started  04/02/13: coumadin started with heparin bridge  Assessment and Plan:  [name] is a 32 y.o. year old female with a hx of T1DM, HTN, HLD presenting with acute (likely on chronic) renal failure with creatinine markedly above baseline.   # ARF: creatinine 6.87 on admission, was 6.36 on 6/17, 1.4 in January 2013. ?diabetic nephropathy vs hypertensive nephropathy vs some acute process.  - Cr of 7.37 today. Had dialysis on 04/01/13, planning for it again today - fluid status: negative 4.9L since admission with 2L removed during dialysis. Some edema on exam but not markedly so. Will continue to follow renal recs regarding dialysis.  - follow daily weights and I/Os - renal u/s unremarkable, ANA negative, SPEP no M spike  [ ]  f/u renal recs  [ ]  venogram today per vascular/nephrology [ ]  AV access before discharge if possible [ ]  renal panel in am  # Branchial vein bilateral thrombosis:  - on heparin and coumadin bridge sine 04/02/13. Spoke with Dr. Darnelle Catalan with hematology/oncology who confirms that this is the appropriate treatment. If patient's family is unwilling to pursue heparin due to concern for fluid overload, lovenox may be possible, although it would require renal dosing per pharmacy. The new oral anticoagulants would not be best choice in this case as they would increase risk of bleeding, would not be well suited in ESRD and could not be easily reversed for future  procedures such as HD fistula.  - INR: 1.32 today - thus far hypercoaguability panel unremarkable - has tenuous IV access. NOT a candidate for PICC line. If loses IV, will need central line vs. Switch heparin drip to lovenox SQ per pharmacy. [ ]  f/u hypercoag panel  # Tongue and neck swelling: reported previously, no longer complains of this and appears comfortable on room air - continue to monitor  # N/V:  - likely secondary to ARF  - IV zofran prn   # DM:  - currently on Lantus 11 units QHS (normally does 12 u QAM at home as well). Increase to home dose - moderate sliding scale, required 11 units SSI in last 24 hours with CBG's ranging from 50-336. Will not increase insulin as did have low to 50. - A1c 8.4  # HTN: BP's stable - no longer ACE candidate with initiation of dialysis  - continue amlodipine 10mg  with prn IV hydralazine  - dialysis will also likely help with HTN  [ ]  f/u BP's  # Hypokalemia: resolved, correcting per dialysis prn  # Constipation: Patient reported and seen on abdominal series. May be secondary to untreated hypothyroidism. Had BM on 6/29, feels better now. - s/p soap suds enema  - continue miralax tid and senna  # HLD:  - recently had lipids checked at PCP's office. Defer treatment of lipids to PCP.   # Hypothyroidism:  - previous radionucleotide ablation from Graves Disease per EMR  - TSH 265 with low T3 & T4: start synthroid 50 mcg daily started 04/02/13, titrate up  [ ]  needs f/u TSH  in 6 weeks    # FEN/GI:  - SLIV  - carb modified diet  - NOT a candidate for PICC. If loses IV access, needs central line if in need of continuous IV medications  # Prophylaxis:  - on therapeutic anticoagulation (heparin bridge to coumadin)   Disposition:  Pending setting up outpatient dialysis, therapeutic INR, permanent dialysis access  Subjective:  Feeling of tongue swelling overnight with feeling of throat swelling as well. Upper respiratory snoring and  wheezing sounds overnight. No new O2 requirements.   Patient's partner is concerned about the heparin adding on too much fluid.   Objective: Temp:  [98.6 F (37 C)-99.1 F (37.3 C)] 98.6 F (37 C) (06/30 0612) Pulse Rate:  [78-90] 78 (06/30 0612) Resp:  [18] 18 (06/30 0612) BP: (136-149)/(83-86) 141/83 mmHg (06/30 0612) SpO2:  [100 %] 100 % (06/30 0612) Weight:  [151 lb 9.6 oz (68.765 kg)] 151 lb 9.6 oz (68.765 kg) (06/29 2114) Physical Exam: General: no acute distress, lying in bed, pleasant, cooperative Cardiovascular: S1S2, RRR, 2/6 systolic murmur Respiratory: normal work of breathing, no wheezing, no crackles ascultated Extremities: 1+ pitting edema in bilateral shins Abd: soft, nontender to palpation Neuro: grossly nonfocal, speech intact  Laboratory:  CBC  Recent Labs Lab 04/02/13 1022 04/03/13 0116 04/04/13 0600  WBC 6.0 7.4 6.9  HGB 10.7* 9.2* 9.3*  HCT 30.1* 26.5* 27.0*  PLT 287 244 245     BMET  Recent Labs Lab 04/02/13 1022 04/03/13 0116 04/04/13 0600  NA 135 134* 139  K 3.7 3.2* 3.5  CL 100 100 106  CO2 24 24 24   BUN 31* 40* 43*  CREATININE 5.98* 6.64* 7.37*  GLUCOSE 317* 231* 100*  CALCIUM 8.0* 7.7* 8.3*      Hgb A1c 8.4  Antithrombin III: 110% (normal) Homocystein: 9.8 (normal) Protein C, S, factor V, lupus anticoag, beta 2 glycoprotein, prothrombin gene mutation, cardiolipin Ab: pending  Imaging/Diagnostic Tests:  Renal Ultrasound:  -Right Kidney: Normal in size and parenchymal echogenicity. No evidence of mass or hydronephrosis. 10.5 cm length.  -Left Kidney: Normal in size and parenchymal echogenicity. No evidence of mass or hydronephrosis. 9.0 cm length.  -Bladder: Appears normal for degree of bladder distention. Bilateral ureteral jets demonstrated.  IMPRESSION: Normal study.  Right upper extremity vein map: 04/01/13 Unable to visualize the left cephalic vein in its entirety due to IV placement.  Incidental finding: Bilateral  single brachial vein thrombosis.    Susan Dodrill, MD 04/04/2013, 8:47 AM PGY-1, [facility] FPTS Intern pager: [phone]

## 2013-04-04 NOTE — Progress Notes (Signed)
Hemodialysis goal of 3000cc not met. Patient experienced shortness of breath, cramping and hypotension with 27 minutes remaining on hemodialysis. All hemodialysis interventions followed with no resolution. Dr Hyman Hopes paged and notified of patient's treatment progression and hads ordered to terminate treatment.1800cc removed

## 2013-04-05 LAB — GLUCOSE, CAPILLARY
Glucose-Capillary: 153 mg/dL — ABNORMAL HIGH (ref 70–99)
Glucose-Capillary: 209 mg/dL — ABNORMAL HIGH (ref 70–99)

## 2013-04-05 LAB — IRON AND TIBC
Iron: 57 ug/dL (ref 42–135)
UIBC: 127 ug/dL (ref 125–400)

## 2013-04-05 LAB — CBC
HCT: 26.6 % — ABNORMAL LOW (ref 36.0–46.0)
MCHC: 35 g/dL (ref 30.0–36.0)
Platelets: 193 10*3/uL (ref 150–400)
RDW: 12.7 % (ref 11.5–15.5)
WBC: 7.2 10*3/uL (ref 4.0–10.5)

## 2013-04-05 LAB — PROTIME-INR
INR: 2.36 — ABNORMAL HIGH (ref 0.00–1.49)
Prothrombin Time: 25 seconds — ABNORMAL HIGH (ref 11.6–15.2)

## 2013-04-05 LAB — HEPARIN LEVEL (UNFRACTIONATED): Heparin Unfractionated: 0.41 IU/mL (ref 0.30–0.70)

## 2013-04-05 MED ORDER — DARBEPOETIN ALFA-POLYSORBATE 100 MCG/0.5ML IJ SOLN
100.0000 ug | INTRAMUSCULAR | Status: DC
  Filled 2013-04-05: qty 0.5

## 2013-04-05 NOTE — Progress Notes (Signed)
Patient ID: [name], female   DOB: October 28, 1980, 32 y.o.   MRN: [id number] Patient will need to get permanent access prior to d/c.  Agree with ongoing anticoagulation.

## 2013-04-05 NOTE — Progress Notes (Signed)
Subjective: Interval History: none.  Objective: Vital signs in last 24 hours: Temp:  [97.9 F (36.6 C)-98.8 F (37.1 C)] 98.8 F (37.1 C) (07/01 0436) Pulse Rate:  [74-107] 74 (07/01 0436) Resp:  [15-20] 18 (07/01 0436) BP: (98-175)/(50-101) 159/75 mmHg (07/01 0436) SpO2:  [100 %] 100 % (07/01 0436) Weight:  [67.096 kg (147 lb 14.7 oz)-67.7 kg (149 lb 4 oz)] 67.096 kg (147 lb 14.7 oz) (06/30 2046) Weight change: -1.065 kg (-2 lb 5.6 oz)  Intake/Output from previous day: 06/30 0701 - 07/01 0700 In: 480 [P.O.:480] Out: 2767 [Urine:1000] Intake/Output this shift:    General appearance: alert and cooperative Resp: clear to auscultation bilaterally Cardio: S1, S2 normal and systolic murmur: holosystolic 2/6, blowing at apex GI: pos bs, liver down 3 cm, soft Extremities: extremities normal, atraumatic, no cyanosis or edema  Lab Results:  Recent Labs  04/04/13 0600 04/05/13 0543  WBC 6.9 7.2  HGB 9.3* 9.3*  HCT 27.0* 26.6*  PLT 245 193   BMET:  Recent Labs  04/03/13 0116 04/04/13 0600  NA 134* 139  K 3.2* 3.5  CL 100 106  CO2 24 24  GLUCOSE 231* 100*  BUN 40* 43*  CREATININE 6.64* 7.37*  CALCIUM 7.7* 8.3*   No results found for this basename: PTH,  in the last 72 hours Iron Studies:  Recent Labs  04/04/13 1745  IRON 57  TIBC 184*    Studies/Results: No results found.  I have reviewed the patient's current medications.  Assessment/Plan: 1 CRF fro HD  Tomorrow. Needs perm access 2 Anemia epo/fe 3 HPTH Vit D 4 HTN meds, lower vol 5 DM controlled P HD, access, DM control, fe/epo   LOS: 6 days   [name] L 04/05/2013,9:42 AM

## 2013-04-05 NOTE — Progress Notes (Signed)
Family Medicine Teaching Service Daily Progress Note Intern Pager: [phone]  Patient name: [name] Medical record number: [id number] Date of birth: October 29, 1980 Age: 32 y.o. Gender: female  Primary Care Provider: No primary provider on file. Consultants: Nephrology, Vascular Surgery Code Status: Full  Pt Overview and Major Events to Date:  04/01/13: found to have bilateral single brachial vein DVT's on vein mapping.  04/01/13: Right IJ HD cath placed and hemodialysis started  04/02/13: coumadin started with heparin bridge 04/04/13: venogram shows no upper extremity DVTs  Assessment and Plan: OSHA RANE is a 32 y.o. year old female with a hx of T1DM, HTN, HLD presenting with acute (likely on chronic) renal failure with creatinine markedly above baseline.   # ARF: creatinine 6.87 on admission, was 6.36 on 6/17, 1.4 in January 2013. ?diabetic nephropathy vs hypertensive nephropathy vs acute process.  - Cr 7.37 6/30. Next HD planned 7/2  - fluid status: negative 4.9L since admission with 2L removed during dialysis. Will continue to follow renal recs regarding dialysis.  - follow daily weights and I/Os  - renal u/s unremarkable, ANA negative, SPEP no M spike  [ ]  AV access ASAP per vascular surgery [ ]  f/u renal recs  [ ]  renal panel in am   # N/V:  - Improving, likely secondary to ARF  - IV zofran prn   # DM:  - currently on Lantus 12 units qHS, which is home dose. - moderate sliding scale, requiring 6 units total today, with CBG's ranging from 146-164.  - A1c 8.4   # HTN: BP's stable  - no longer ACE candidate with initiation of dialysis  - continue amlodipine 10mg  with prn IV hydralazine  - dialysis will also likely help with HTN  [ ]  f/u BP's   # Hypokalemia: resolved, correcting per dialysis prn   # Constipation: Patient reported and seen on abdominal series. May be secondary to untreated hypothyroidism. Had BM on 6/29, feels better now.  - s/p soap suds enema  -  continue miralax tid and senna   # HLD:  - recently had lipids checked at PCP's office. Defer treatment of lipids to PCP.   # Hypothyroidism:  - previous radionucleotide ablation from Graves Disease per EMR  - TSH 265 with low T3 & T4: start synthroid 50 mcg daily started 04/02/13, titrate up  [ ]  needs f/u TSH in 6 weeks   # FEN/GI:  - SLIV  - carb modified diet  - NOT a candidate for PICC. If loses IV access, needs central line if in need of continuous IV medications   # Prophylaxis:  - currently on anticoagulation (heparin bridge to coumadin)  [ ]  f/u vascular surgery recs in light of 6/30 negative venogram.  Disposition:  Pending setting up outpatient dialysis, therapeutic INR, permanent dialysis access  Subjective: Pt without complaints.   Objective: Temp:  [97.9 F (36.6 C)-99 F (37.2 C)] 99 F (37.2 C) (07/01 1031) Pulse Rate:  [74-107] 81 (07/01 1031) Resp:  [15-20] 18 (07/01 1031) BP: (98-175)/(50-101) 148/99 mmHg (07/01 1031) SpO2:  [100 %] 100 % (07/01 1031) Weight:  [147 lb 14.7 oz (67.096 kg)-149 lb 4 oz (67.7 kg)] 147 lb 14.7 oz (67.096 kg) (06/30 2046) Physical Exam: General: alert and cooperative Cardiovascular: RRR 2/6 holosystolic murmur noted, no dependent edema  Respiratory: CTA bilaterally, nonlabored Abdomen: Soft, NT, ND, hypoactive BS Extremities: MAEW, full ROM.   Laboratory:  Recent Labs Lab 04/03/13 0116 04/04/13 0600 04/05/13 0543  WBC 7.4 6.9 7.2  HGB 9.2* 9.3* 9.3*  HCT 26.5* 27.0* 26.6*  PLT 244 245 193    Recent Labs Lab 03/30/13 1205  04/02/13 1022 04/03/13 0116 04/04/13 0600  NA 130*  < > 135 134* 139  K 2.9*  < > 3.7 3.2* 3.5  CL 97  < > 100 100 106  CO2 16*  < > 24 24 24   BUN 53*  < > 31* 40* 43*  CREATININE 6.87*  < > 5.98* 6.64* 7.37*  CALCIUM 8.9  < > 8.0* 7.7* 8.3*  PROT 6.4  --   --   --   --   BILITOT 0.3  --   --   --   --   ALKPHOS 61  --   --   --   --   ALT 20  --   --   --   --   AST 20  --   --   --    --   GLUCOSE 203*  < > 317* 231* 100*  < > = values in this interval not displayed.    Imaging/Diagnostic Tests: 6/30 venogram: no evidence of thrombus or occlusion. Patent central veins bilaterally.   Hazeline Junker, MD 04/05/2013, 1:06 PM PGY-1, [facility] FPTS Intern pager: [phone], text pages welcome

## 2013-04-05 NOTE — Progress Notes (Addendum)
ANTICOAGULATION CONSULT NOTE - Follow Up Consult  Pharmacy Consult for heparin and coumadin Indication: Bilateral Upper Extremity DVTs   No Known Allergies  Patient Measurements: Height: 5' 2.5" (158.8 cm) Weight: 147 lb 14.7 oz (67.096 kg) IBW/kg (Calculated) : 51.25 Heparin Dosing Weight: 65 kg  Vital Signs: Temp: 98.8 F (37.1 C) (07/01 0436) Temp src: Oral (07/01 0436) BP: 159/75 mmHg (07/01 0436) Pulse Rate: 74 (07/01 0436)  Labs:  Recent Labs  04/03/13 0116  04/04/13 0600 04/04/13 1011 04/05/13 0543  HGB 9.2*  --  9.3*  --  9.3*  HCT 26.5*  --  27.0*  --  26.6*  PLT 244  --  245  --  193  LABPROT 13.4  --  16.1*  --  25.0*  INR 1.04  --  1.32  --  2.36*  HEPARINUNFRC 0.50  < > 0.55 0.48 0.41  CREATININE 6.64*  --  7.37*  --   --   < > = values in this interval not displayed.  Estimated Creatinine Clearance: 10.2 ml/min (by C-G formula based on Cr of 7.37).  Assessment: Patient is a 32 y.o F on anticoagulation overlap day #4 of 5 days minimum for bilateral brachial vein thrombosis. VSS is working patient up for permanent access for HD. Venogram on 6/30 with "no evidence of thrombus or occlusion."  If surgery is needed for perm access, please advice when you want [place] to hold anticoagulation for patient for procedure.  Heparin level is therapeutic this morning.  INR is therapeutic but increased noticeable from 1.32 to 2.36 today.   Goal of Therapy:  Heparin level 0.3-0.7 units/ml; INR 2-3 Monitor platelets by anticoagulation protocol: Yes   Plan:  1) continue current heparin rate 2) hold coumadin today  Tywanda Rice P 04/05/2013,10:24 AM  Adden (10:57 AM): spoke to Dr. Arbie Cookey-- hold coumadin for now until after permanent access procedure.

## 2013-04-05 NOTE — Progress Notes (Signed)
04/05/13 13:50  Spoke with vascular surgery, Dr. Myra Gianotti Re: anticoagulation and access.  Venogram does not r/o clot and the risks of access placement at this time outweigh benefits.    Plan: - Will continue heparin bridge and coumadin - HD via cath placed 6/27 per renal - f/u with Dr. Myra Gianotti outpatient in 6 weeks.    - Hazeline Junker, MD, PGY1 Family Medicine

## 2013-04-05 NOTE — Progress Notes (Signed)
Family Medicine Teaching Service Attending Note  I interviewed and examined patient [name] and reviewed their tests and x-rays.  I discussed with Dr. Jarvis Newcomer and reviewed their note for today.  I agree with their assessment and plan.     Additionally  Feeling well Proceed with HD as per renal and vascular ? Of Upper extremity DVT or not - need to make definitive diagnosis - have to worry about risks for warfarin for uncertain diagnosis unless would need regardless from a vascular standpoint?

## 2013-04-06 ENCOUNTER — Inpatient Hospital Stay (HOSPITAL_COMMUNITY): Payer: BC Managed Care – PPO

## 2013-04-06 DIAGNOSIS — I517 Cardiomegaly: Secondary | ICD-10-CM

## 2013-04-06 LAB — COMPREHENSIVE METABOLIC PANEL
Alkaline Phosphatase: 52 U/L (ref 39–117)
BUN: 29 mg/dL — ABNORMAL HIGH (ref 6–23)
CO2: 23 mEq/L (ref 19–32)
Chloride: 100 mEq/L (ref 96–112)
GFR calc Af Amer: 10 mL/min — ABNORMAL LOW (ref >90)
Glucose, Bld: 174 mg/dL — ABNORMAL HIGH (ref 70–99)
Potassium: 3.6 mEq/L (ref 3.5–5.1)
Total Bilirubin: 0.1 mg/dL — ABNORMAL LOW (ref 0.3–1.2)

## 2013-04-06 LAB — GLUCOSE, CAPILLARY
Glucose-Capillary: 360 mg/dL — ABNORMAL HIGH (ref 70–99)
Glucose-Capillary: >600 mg/dL (ref 70–99)
Glucose-Capillary: 576 mg/dL (ref 70–99)

## 2013-04-06 LAB — ABO/RH: ABO/RH(D): B POS

## 2013-04-06 LAB — CBC
HCT: 27.1 % — ABNORMAL LOW (ref 36.0–46.0)
Hemoglobin: 9.4 g/dL — ABNORMAL LOW (ref 12.0–15.0)
WBC: 6.1 10*3/uL (ref 4.0–10.5)

## 2013-04-06 LAB — PHOSPHORUS: Phosphorus: 5 mg/dL — ABNORMAL HIGH (ref 2.3–4.6)

## 2013-04-06 LAB — PROTIME-INR: Prothrombin Time: 23.4 seconds — ABNORMAL HIGH (ref 11.6–15.2)

## 2013-04-06 MED ORDER — ACETAMINOPHEN 325 MG PO TABS
650.0000 mg | ORAL_TABLET | Freq: Four times a day (QID) | ORAL | Status: DC | PRN
  Administered 2013-04-06 – 2013-04-07 (×2): 650 mg via ORAL
  Filled 2013-04-06 (×2): qty 2

## 2013-04-06 MED ORDER — INSULIN ASPART 100 UNIT/ML ~~LOC~~ SOLN
15.0000 [IU] | Freq: Once | SUBCUTANEOUS | Status: AC
  Administered 2013-04-06: 15 [IU] via SUBCUTANEOUS

## 2013-04-06 MED ORDER — PENTAFLUOROPROP-TETRAFLUOROETH EX AERO: 1.0000 "application " | INHALATION_SPRAY | CUTANEOUS | Status: DC | PRN

## 2013-04-06 MED ORDER — SODIUM CHLORIDE 0.9 % IV SOLN: 100.0000 mL | INTRAVENOUS | Status: DC | PRN

## 2013-04-06 MED ORDER — ALTEPLASE 2 MG IJ SOLR
2.0000 mg | Freq: Once | INTRAMUSCULAR | Status: DC | PRN
  Filled 2013-04-06: qty 2

## 2013-04-06 MED ORDER — NEPRO/CARBSTEADY PO LIQD: 237.0000 mL | ORAL | Status: DC | PRN

## 2013-04-06 MED ORDER — HEPARIN SODIUM (PORCINE) 1000 UNIT/ML DIALYSIS
100.0000 [IU]/kg | INTRAMUSCULAR | Status: DC | PRN
  Administered 2013-04-06: 6700 [IU] via INTRAVENOUS_CENTRAL

## 2013-04-06 MED ORDER — HEPARIN SODIUM (PORCINE) 1000 UNIT/ML DIALYSIS: 1000.0000 [IU] | INTRAMUSCULAR | Status: DC | PRN

## 2013-04-06 MED ORDER — PHYTONADIONE 5 MG PO TABS
5.0000 mg | ORAL_TABLET | Freq: Once | ORAL | Status: AC
  Administered 2013-04-06: 5 mg via ORAL
  Filled 2013-04-06: qty 1

## 2013-04-06 MED ORDER — DARBEPOETIN ALFA-POLYSORBATE 100 MCG/0.5ML IJ SOLN
INTRAMUSCULAR | Status: AC
  Administered 2013-04-06: 100 ug via INTRAVENOUS
  Filled 2013-04-06: qty 0.5

## 2013-04-06 MED ORDER — HEPARIN (PORCINE) IN NACL 100-0.45 UNIT/ML-% IJ SOLN
800.0000 [IU]/h | INTRAMUSCULAR | Status: DC
  Filled 2013-04-06 (×2): qty 250

## 2013-04-06 MED ORDER — LIDOCAINE-PRILOCAINE 2.5-2.5 % EX CREA: 1.0000 "application " | TOPICAL_CREAM | CUTANEOUS | Status: DC | PRN

## 2013-04-06 MED ORDER — LIDOCAINE HCL (PF) 1 % IJ SOLN: 5.0000 mL | INTRAMUSCULAR | Status: DC | PRN

## 2013-04-06 NOTE — Procedures (Signed)
I was present at this session.  I have reviewed the session itself and made appropriate changes.  bp coming down,cath flow ok.  Shaquille Murdy L 7/2/20149:24 AM

## 2013-04-06 NOTE — Progress Notes (Signed)
  Echocardiogram 2D Echocardiogram has been performed.  Cathie Beams 04/06/2013, 4:18 PM

## 2013-04-06 NOTE — Progress Notes (Signed)
Spoke with Harrison Mons in the lab who states that she is unable to see an order for a heparin level (daily) on this pt. A tube was sent to the lab this a.m. At 0700 along with blood for a renal and cbc, the other labs were ran but not the heparin level. When I called to check on it I was told that there were no orders in for the daily heparin level and that the blue tube had been "saved", however they are now unable to use this tube d/t length of time saved. Another heparin level was ordered by the pharmacist and will have to be redrawn by the lab. This was reported to pts floor nurse after dialysis.

## 2013-04-06 NOTE — Progress Notes (Signed)
04/06/2013 2:20 PM Hemodialysis Outpatient Note; this patient has been accepted at the [facility] on a Mon-Wed-Fri 2nd shift schedule. The center can begin treatment on Friday July 4th at 12:00 noon. Thank you. Tilman Neat

## 2013-04-06 NOTE — Progress Notes (Addendum)
Family Medicine Teaching Service Attending Note  I interviewed and examined patient [name] and reviewed their tests and x-rays.  I discussed with Dr. Richarda Blade and reviewed their note for today.  I agree with their assessment and plan.     Additionally  Proceed as per vascular Unclear why believe has upper extrem thrombus when venogram (my understanding is the gold standard) is negative? Stop warfarin as per their recommendations

## 2013-04-06 NOTE — Progress Notes (Signed)
Patient started complaining of shortness of breath, tingling in arms, neck tightness, and sweats. Patient was put on 2 L of oxygen and vitals were taken: 97.6T, 146/95, 87HR, 20RR, 100% on 2 L. CBG was taken: 564, repeats >600, 576. Family medicine Dr Mikel Cella and Jarvis Newcomer were notified. Both doctors came to bedside to assess. MD ordered EKG-NSR, chest xray, troponin, and echo. Patient stated the symptoms were beginning to subside. 15 units insulin was ordered and given. Patient will continue to be monitored.

## 2013-04-06 NOTE — Progress Notes (Signed)
[facility] [place]  Subjective  -  After speaking with Dr. Detterding today, we have decided to proceed with femoral access.  I am reluctant to place an upper extremity access with known DVT   Physical Exam:  CV:  RRR PULM:  CTA EXT:  Palp pedal pulses    Assessment/Plan:  ESRD  Even though the venogram did not show thrombus, the U/S did.  Plus, the patient has a swollen left arm consistent with DVT.  I suspect the thrombus is occlusive, and therefore the dye on venogram took the path of least resistence and therefore did not show clot.  I would not place an upper arm AVGG for at least 3 months.  For that reason, after discussing this with the renal service, we will place a femoral access.  The patient was counseled on the risks and benefits including the risk of infection and steal as well as occlusion.  She is theraputicon her coumadin, so I will reverse it with FFP and Vit K, so that we can proceed tomorrow  Zanyah Lentsch IV, V. WELLS 04/06/2013 9:33 PM --  Filed Vitals:   04/06/13 2100  BP: 136/94  Pulse: 93  Temp: 98.2 F (36.8 C)  Resp: 17    Intake/Output Summary (Last 24 hours) at 04/06/13 2133 Last data filed at 04/06/13 2100  Gross per 24 hour  Intake 783.33 ml  Output   2350 ml  Net -1566.67 ml     Laboratory CBC    Component Value Date/Time   WBC 6.1 04/06/2013 0721   HGB 9.4* 04/06/2013 0721   HCT 27.1* 04/06/2013 0721   PLT 212 04/06/2013 0721    BMET    Component Value Date/Time   NA 135 04/06/2013 0721   K 3.6 04/06/2013 0721   CL 100 04/06/2013 0721   CO2 23 04/06/2013 0721   GLUCOSE 174* 04/06/2013 0721   BUN 29* 04/06/2013 0721   CREATININE 6.07* 04/06/2013 0721   CALCIUM 8.3* 04/06/2013 0721   CALCIUM 7.7* 04/04/2013 1745   GFRNONAA 9* 04/06/2013 0721   GFRAA 10* 04/06/2013 0721    COAG Lab Results  Component Value Date   INR 2.16* 04/06/2013   INR 2.36* 04/05/2013   INR 1.32 04/04/2013   No results found for this basename: PTT     Antibiotics Anti-infectives   Start     Dose/Rate Route Frequency Ordered Stop   04/01/13 1000  ceFAZolin (ANCEF) IVPB 2 g/50 mL premix     2 g 100 mL/hr over 30 Minutes Intravenous On call 04/01/13 0945 04/01/13 1141   04/01/13 0945  ceFAZolin (ANCEF) IVPB 1 g/50 mL premix  Status:  Discontinued    Comments:  Send with pt to OR   1 g 100 mL/hr over 30 Minutes Intravenous On call 04/01/13 0943 04/01/13 0944       V. Wells Mikita Lesmeister IV, M.D. [facility] [place] Office: [phone] Pager:  [phone]  

## 2013-04-06 NOTE — Progress Notes (Signed)
Family Medicine Teaching Service Daily Progress Note Intern Pager: [phone]  Patient name: [name] Medical record number: [id number] Date of birth: 1981/03/19 Age: 32 y.o. Gender: female  Primary Care Provider: No primary provider on file. Consultants: Nephrology, Vascular Surgery Code Status: Full  Pt Overview and Major Events to Date:  04/01/13: Vein mapping showed bilateral single brachial vein DVT's.  04/01/13: Right IJ HD cath placed and hemodialysis started  04/02/13: Coumadin started with heparin bridge  04/04/13: Venogram showed no upper extremity DVTs, pt still not candidate for upper extremity access 04/06/13: Vascular surgery to establish lower extremity access  Assessment and Plan:  # Chronic renal failure: creatinine 6.87 on admission, was 6.36 on 6/17, 1.4 in January 2013. Diabetic +/- hypertensive nephropathy - HD today - Will continue to follow renal recs regarding dialysis.  - follow daily weights and I/Os  - renal U/S unremarkable, ANA negative, SPEP no M spike   # N/V:  - Improving, likely 2/2 uremia - IV zofran prn   # DM:  - currently on Lantus 12 units qHS, which is home dose.  - moderate sliding scale, requiring 23 units/24hrs, with CBG's >180.  - A1c 8.4   # HTN: BP's stable  - continue amlodipine 10mg  with prn IV hydralazine  - dialysis will also likely help with HTN   # Hypokalemia: resolved, correcting per dialysis prn   # Constipation: May be secondary to untreated hypothyroidism. Last BM on 6/29, improved - s/p soap suds enema  - continue miralax tid and senna   # HLD:  - recently had lipids checked at PCP's office. Defer treatment of lipids to PCP.   # Hypothyroidism:  - previous radionucleotide ablation from Graves Disease per EMR  - TSH 265 with low T3 & T4: synthroid 50 mcg daily started 04/02/13 [ ]  needs f/u TSH in 6 weeks for titration  # FEN/GI:  - SLIV  - carb modified diet  - NOT a candidate for PICC. If loses IV access,  needs central line if in need of continuous IV medications   # Prophylaxis:  - currently on anticoagulation (heparin bridge to coumadin)  [ ]  f/u vascular surgery recs in light of 6/30 negative venogram.   Disposition:  Pending setting up outpatient dialysis, therapeutic INR, permanent dialysis access   Subjective: Pt without complaints at this time.   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 MAEW Derm: dialysis catheter insertion site c/d/i below R clavicle.   Laboratory:  Recent Labs Lab 04/04/13 0600 04/05/13 0543 04/06/13 0721  WBC 6.9 7.2 6.1  HGB 9.3* 9.3* 9.4*  HCT 27.0* 26.6* 27.1*  PLT 245 193 212    Recent Labs Lab 03/30/13 1205  04/03/13 0116 04/04/13 0600 04/04/13 1745 04/06/13 0721  NA 130*  < > 134* 139  --  135  K 2.9*  < > 3.2* 3.5  --  3.6  CL 97  < > 100 106  --  100  CO2 16*  < > 24 24  --  23  BUN 53*  < > 40* 43*  --  29*  CREATININE 6.87*  < > 6.64* 7.37*  --  6.07*  CALCIUM 8.9  < > 7.7* 8.3* 7.7* 8.3*  PROT 6.4  --   --   --   --  5.5*  BILITOT 0.3  --   --   --   --  0.1*  ALKPHOS 61  --   --   --   --  52  ALT 20  --   --   --   --  10  AST 20  --   --   --   --  19  GLUCOSE 203*  < > 231* 100*  --  174*  < > = values in this interval not displayed.    Imaging/Diagnostic Tests:  6/27 Vein map: shows thrombus in bilateral brachial veins. Will defer to medical service as to  treat this with anticoagulation. Patient will need bilateral venogram before access plan finalized.   6/30 Venogram: On the left arm there was opacification from a branch into the brachial vein at the antecubital space. The vein was small but was patent throughout the course to include the subclavian vein and innominate vein. There was no evidence of clot in the brachial vein  The left arm venogram showed a venous structures throughout the arm. Again the veins were small but the brachial vein was patent with no evidence of thrombus. This was a patent through the axilla subclavian and innominate vein on the left as well.  Findings bilateral upper extremity venograms with no evidence of thrombus or occlusion. Patent central veins bilaterally.   Hazeline Junker, MD 04/06/2013, 8:31 AM PGY-1, [facility] Family Medicine FPTS Intern pager: [phone], text pages welcome

## 2013-04-06 NOTE — Progress Notes (Addendum)
ANTICOAGULATION CONSULT NOTE - Follow Up Consult  Pharmacy Consult for heparin Indication: bilateral brachial vein thrombosis  No Known Allergies  Patient Measurements: Height: 5' 2.5" (158.8 cm) Weight: 143 lb 15.4 oz (65.3 kg) IBW/kg (Calculated) : 51.25   Vital Signs: Temp: 97.6 F (36.4 C) (07/02 1427) Temp src: Oral (07/02 1012) BP: 146/95 mmHg (07/02 1427) Pulse Rate: 87 (07/02 1427)  Labs:  Recent Labs  04/04/13 0600 04/04/13 1011 04/05/13 0543 04/06/13 0721 04/06/13 1400  HGB 9.3*  --  9.3* 9.4*  --   HCT 27.0*  --  26.6* 27.1*  --   PLT 245  --  193 212  --   LABPROT 16.1*  --  25.0*  --  23.4*  INR 1.32  --  2.36*  --  2.16*  HEPARINUNFRC 0.55 0.48 0.41  --  1.78*  CREATININE 7.37*  --   --  6.07*  --     Estimated Creatinine Clearance: 12.3 ml/min (by C-G formula based on Cr of 6.07).   Assessment: Patient is a 32 y.o F on anticoagulation for bilateral vein thrombosis with plan for permanent access for HD tomorrow.  Coumadin has been on hold since 7/01 in anticipation of procedure.  There was a delayed in lab results today (see note from HD nurse Katherine Roan).  Heparin level and INR now finally back as 1.78 and 2.16, respectively.  I verified with patient that blood samples were drawn on the correct arm.  Unsure as to why heparin level increased sharply to 1.78 today as she has been therapeutic with current regimen for the past couple of days.  Perhaps she is accumulating drug.  No bleeding reported by patient. CBC stable. Two units of FFP and vit k 5mg  PO x1 ordered by VVS for surgical procedure tomorrow.  Goal of Therapy:  Heparin level 0.3-0.7 units/ml Monitor platelets by anticoagulation protocol: Yes   Plan:  1) hold heparin drip for 1 hour, then resume drip at 700 units/hr 2) check 8 hour heparin level  Debria Broecker P 04/06/2013,2:51 PM

## 2013-04-06 NOTE — Progress Notes (Signed)
Hypoglycemic Event  CBG: 68  Treatment: 15 GM carbohydrate snack  Symptoms: None  Follow-up CBG: Time:22:44 CBG Result:86 Possible Reasons for Event: Unknown  Comments/MD notified:Yes,Dr. Hairford    Tanishka Drolet Joselita,RN  Remember to initiate Hypoglycemia Order Set & complete

## 2013-04-06 NOTE — Progress Notes (Signed)
Subjective: Interval History: none.  Objective: Vital signs in last 24 hours: Temp:  [97.9 F (36.6 C)-99.2 F (37.3 C)] 98.2 F (36.8 C) (07/02 0706) Pulse Rate:  [74-102] 94 (07/02 0900) Resp:  [18] 18 (07/02 0706) BP: (94-163)/(61-108) 117/72 mmHg (07/02 0900) SpO2:  [98 %-100 %] 98 % (07/02 0706) Weight:  [66.45 kg (146 lb 7.9 oz)-67.096 kg (147 lb 14.7 oz)] 66.45 kg (146 lb 7.9 oz) (07/02 0706) Weight change: -0.604 kg (-1 lb 5.3 oz)  Intake/Output from previous day: 07/01 0701 - 07/02 0700 In: 1013.4 [P.O.:840; I.V.:173.4] Out: 900 [Urine:900] Intake/Output this shift:    General appearance: alert, cooperative and moderately obese Resp: clear to auscultation bilaterally Cardio: S1, S2 normal and systolic murmur: holosystolic 2/6, blowing at apex GI: obese,pos bs,soft,liver down4 cm Extremities: extremities normal, atraumatic, no cyanosis or edema  Lab Results:  Recent Labs  04/05/13 0543 04/06/13 0721  WBC 7.2 6.1  HGB 9.3* 9.4*  HCT 26.6* 27.1*  PLT 193 212   BMET:  Recent Labs  04/04/13 0600 04/04/13 1745 04/06/13 0721  NA 139  --  135  K 3.5  --  3.6  CL 106  --  100  CO2 24  --  23  GLUCOSE 100*  --  174*  BUN 43*  --  29*  CREATININE 7.37*  --  6.07*  CALCIUM 8.3* 7.7* 8.3*    Recent Labs  04/04/13 1745  PTH 352.4*   Iron Studies:  Recent Labs  04/04/13 1745  IRON 57  TIBC 184*    Studies/Results: No results found.  I have reviewed the patient's current medications.  Assessment/Plan: 1 CRF for HD.  bp improving with vol off. 2 Anemia epo/fe 3 Dm controlled 4 HPTH 5 HTN improving P hd, lower vol, access    LOS: 7 days   Kamaria Lucia L 04/06/2013,9:28 AM

## 2013-04-06 NOTE — Progress Notes (Addendum)
I spoke with Dr. Detterding today.  With bilateral brachial thrombus, pt is not a candidate for upper extremity access.  We will proceed with Lower extremity access tomorrow  Will check coags today and d/c coumadin  Wells BRabham

## 2013-04-06 NOTE — Care Management Note (Signed)
   CARE MANAGEMENT NOTE 04/06/2013  Patient:  [name], [name]   Account Number:  [id number]  Date Initiated:  04/05/2013  Documentation initiated by:  Siddhi Dornbush  Subjective/Objective Assessment:     Action/Plan:   Following for d/c needs.  04/06/2013 Met with pt re d/c need, no HH needs identified, pt lives with partner has transportation and active with [facility] for travel to outpt hemodialysis.   Anticipated DC Date:  04/10/2013   Anticipated DC Plan:  HOME/SELF CARE         Choice offered to / List presented to:             Status of service:  In process, will continue to follow Medicare Important Message given?   (If response is "NO", the following Medicare IM given date fields will be blank) Date Medicare IM given:   Date Additional Medicare IM given:    Discharge Disposition:    Per UR Regulation:    If discussed at Long Length of Stay Meetings, dates discussed:    Comments:  04/06/2013 Met with pt who states that she has transportation to and from outpatient hemodialysis this will be provided by her partner or she will use [facility] as she is an acitve rider of [employer] at this time. No other needs voiced at this time. Johny Shock RN MPH  [phone]

## 2013-04-06 NOTE — Progress Notes (Signed)
Called to bedside by nurse at approximately 15:00 after pt began experiencing neck tightness, tingling in arms and legs, and diaphoresis. Pt received 15 units insulin at 13:45 for CBG 360mg /dL, per moderate scale protocol. Repeat CBG was >500, so 15 supplemental units were given.  Pt with stable vital signs:  T97.6 P87 R20, nonlabored, BP 146/95. No focal neuro deficits on exam, pulses equal bilaterally, S1S2 murmur unchanged from previous exams, lung fields clear throughout.  No chest pain or SOB. On recheck, pt appears and reports feeling better.     Assessed pt with Dr. Mikel Cella and Dr. Jennette Kettle.  Stat ECG nsr  A/P: - Possible post-dialysis fluid shift likely with anxiety component.    CXR, draw blood for CK now  Echo  Closely monitor and recheck CBG  Ryan B. Jarvis Newcomer, MD PGY-1, [facility] 04/06/2013 3:35 PM

## 2013-04-07 ENCOUNTER — Inpatient Hospital Stay (HOSPITAL_COMMUNITY): Payer: BC Managed Care – PPO | Admitting: Anesthesiology

## 2013-04-07 ENCOUNTER — Telehealth: Payer: Self-pay | Admitting: Vascular Surgery

## 2013-04-07 ENCOUNTER — Encounter (HOSPITAL_COMMUNITY): Payer: Self-pay | Admitting: Anesthesiology

## 2013-04-07 ENCOUNTER — Encounter (HOSPITAL_COMMUNITY)
Admission: EM | Disposition: A | Discharge status: Home / Self Care | Payer: Self-pay | Source: Home / Self Care | Attending: Family Medicine

## 2013-04-07 HISTORY — PX: AV FISTULA PLACEMENT: SHX1204

## 2013-04-07 LAB — PREPARE FRESH FROZEN PLASMA: Unit division: 0

## 2013-04-07 LAB — GLUCOSE, CAPILLARY
Glucose-Capillary: 123 mg/dL — ABNORMAL HIGH (ref 70–99)
Glucose-Capillary: 302 mg/dL — ABNORMAL HIGH (ref 70–99)
Glucose-Capillary: 62 mg/dL — ABNORMAL LOW (ref 70–99)
Glucose-Capillary: 68 mg/dL — ABNORMAL LOW (ref 70–99)
Glucose-Capillary: 86 mg/dL (ref 70–99)

## 2013-04-07 LAB — HEPARIN LEVEL (UNFRACTIONATED): Heparin Unfractionated: 0.21 IU/mL — ABNORMAL LOW (ref 0.30–0.70)

## 2013-04-07 LAB — BASIC METABOLIC PANEL
CO2: 21 mEq/L (ref 19–32)
Calcium: 8.4 mg/dL (ref 8.4–10.5)
GFR calc non Af Amer: 10 mL/min — ABNORMAL LOW (ref >90)
Potassium: 4.2 mEq/L (ref 3.5–5.1)
Sodium: 132 mEq/L — ABNORMAL LOW (ref 135–145)

## 2013-04-07 LAB — CBC
MCH: 29.8 pg (ref 26.0–34.0)
MCHC: 35.3 g/dL (ref 30.0–36.0)
MCV: 84.3 fL (ref 78.0–100.0)
Platelets: 138 10*3/uL — ABNORMAL LOW (ref 150–400)
RBC: 2.99 MIL/uL — ABNORMAL LOW (ref 3.87–5.11)

## 2013-04-07 LAB — SURGICAL PCR SCREEN: Staphylococcus aureus: POSITIVE — AB

## 2013-04-07 SURGERY — INSERTION OF ARTERIOVENOUS (AV) GORE-TEX GRAFT THIGH
Anesthesia: General | Site: Leg Upper | Laterality: Right | Wound class: Clean

## 2013-04-07 SURGERY — INSERTION OF ARTERIOVENOUS (AV) GORE-TEX GRAFT THIGH
Anesthesia: General | Site: Leg Upper | Laterality: Right

## 2013-04-07 MED ORDER — PROTAMINE SULFATE 10 MG/ML IV SOLN
INTRAVENOUS | Status: DC | PRN
  Administered 2013-04-07 (×3): 10 mg via INTRAVENOUS

## 2013-04-07 MED ORDER — INSULIN ASPART 100 UNIT/ML ~~LOC~~ SOLN: 3.0000 [IU] | Freq: Once | SUBCUTANEOUS | Status: DC

## 2013-04-07 MED ORDER — INSULIN ASPART 100 UNIT/ML ~~LOC~~ SOLN
SUBCUTANEOUS | Status: AC
  Administered 2013-04-07: 3 [IU] via SUBCUTANEOUS
  Filled 2013-04-07: qty 1

## 2013-04-07 MED ORDER — MUPIROCIN 2 % EX OINT
1.0000 "application " | TOPICAL_OINTMENT | Freq: Two times a day (BID) | CUTANEOUS | Status: DC
  Administered 2013-04-07 – 2013-04-08 (×2): 1 via NASAL
  Filled 2013-04-07: qty 22

## 2013-04-07 MED ORDER — SODIUM CHLORIDE 0.9 % IV SOLN
INTRAVENOUS | Status: DC | PRN
  Administered 2013-04-07 (×2): via INTRAVENOUS

## 2013-04-07 MED ORDER — DEXTROSE 50 % IV SOLN
INTRAVENOUS | Status: AC
  Administered 2013-04-07: 25 mL
  Filled 2013-04-07: qty 50

## 2013-04-07 MED ORDER — LIDOCAINE HCL (CARDIAC) 20 MG/ML IV SOLN
INTRAVENOUS | Status: DC | PRN
  Administered 2013-04-07: 50 mg via INTRAVENOUS

## 2013-04-07 MED ORDER — HYDROMORPHONE HCL PF 1 MG/ML IJ SOLN: 0.2500 mg | INTRAMUSCULAR | Status: DC | PRN

## 2013-04-07 MED ORDER — ACETAMINOPHEN 10 MG/ML IV SOLN
1000.0000 mg | Freq: Once | INTRAVENOUS | Status: AC | PRN
  Administered 2013-04-07: 1000 mg via INTRAVENOUS
  Filled 2013-04-07: qty 100

## 2013-04-07 MED ORDER — CEFAZOLIN SODIUM-DEXTROSE 2-3 GM-% IV SOLR
INTRAVENOUS | Status: AC
  Filled 2013-04-07: qty 50

## 2013-04-07 MED ORDER — PHENYLEPHRINE HCL 10 MG/ML IJ SOLN
INTRAMUSCULAR | Status: DC | PRN
  Administered 2013-04-07 (×5): 80 ug via INTRAVENOUS

## 2013-04-07 MED ORDER — SODIUM CHLORIDE 0.9 % IR SOLN
Status: DC | PRN
  Administered 2013-04-07: 12:00:00

## 2013-04-07 MED ORDER — SUCCINYLCHOLINE CHLORIDE 20 MG/ML IJ SOLN
INTRAMUSCULAR | Status: DC | PRN
  Administered 2013-04-07: 40 mg via INTRAVENOUS

## 2013-04-07 MED ORDER — HEPARIN (PORCINE) IN NACL 100-0.45 UNIT/ML-% IJ SOLN
900.0000 [IU]/h | INTRAMUSCULAR | Status: DC
  Administered 2013-04-07: 800 [IU]/h via INTRAVENOUS
  Filled 2013-04-07 (×2): qty 250

## 2013-04-07 MED ORDER — CEFAZOLIN SODIUM 1-5 GM-% IV SOLN
INTRAVENOUS | Status: DC | PRN
  Administered 2013-04-07: 1 g via INTRAVENOUS

## 2013-04-07 MED ORDER — 0.9 % SODIUM CHLORIDE (POUR BTL) OPTIME
TOPICAL | Status: DC | PRN
  Administered 2013-04-07: 1000 mL

## 2013-04-07 MED ORDER — MIDAZOLAM HCL 5 MG/5ML IJ SOLN
INTRAMUSCULAR | Status: DC | PRN
  Administered 2013-04-07: 2 mg via INTRAVENOUS

## 2013-04-07 MED ORDER — HEPARIN SODIUM (PORCINE) 1000 UNIT/ML IJ SOLN
INTRAMUSCULAR | Status: DC | PRN
  Administered 2013-04-07: 5000 [IU] via INTRAVENOUS

## 2013-04-07 MED ORDER — HYDROMORPHONE HCL PF 1 MG/ML IJ SOLN
INTRAMUSCULAR | Status: AC
  Filled 2013-04-07: qty 1

## 2013-04-07 MED ORDER — THROMBIN 20000 UNITS EX SOLR
CUTANEOUS | Status: AC
  Filled 2013-04-07: qty 20000

## 2013-04-07 MED ORDER — ONDANSETRON HCL 4 MG/2ML IJ SOLN
INTRAMUSCULAR | Status: DC | PRN
  Administered 2013-04-07: 4 mg via INTRAVENOUS

## 2013-04-07 MED ORDER — FENTANYL CITRATE 0.05 MG/ML IJ SOLN
INTRAMUSCULAR | Status: DC | PRN
  Administered 2013-04-07: 50 ug via INTRAVENOUS
  Administered 2013-04-07: 100 ug via INTRAVENOUS
  Administered 2013-04-07: 25 ug via INTRAVENOUS
  Administered 2013-04-07: 50 ug via INTRAVENOUS

## 2013-04-07 MED ORDER — ONDANSETRON HCL 4 MG/2ML IJ SOLN: 4.0000 mg | Freq: Once | INTRAMUSCULAR | Status: AC | PRN

## 2013-04-07 MED ORDER — PROPOFOL 10 MG/ML IV BOLUS
INTRAVENOUS | Status: DC | PRN
  Administered 2013-04-07: 120 mg via INTRAVENOUS
  Administered 2013-04-07: 80 mg via INTRAVENOUS

## 2013-04-07 MED ORDER — ACETAMINOPHEN 10 MG/ML IV SOLN
INTRAVENOUS | Status: AC
  Filled 2013-04-07: qty 100

## 2013-04-07 MED ORDER — CHLORHEXIDINE GLUCONATE CLOTH 2 % EX PADS
6.0000 | MEDICATED_PAD | Freq: Every day | CUTANEOUS | Status: DC
  Administered 2013-04-07 – 2013-04-08 (×2): 6 via TOPICAL

## 2013-04-07 SURGICAL SUPPLY — 38 items
ADH SKN CLS APL DERMABOND .7 (GAUZE/BANDAGES/DRESSINGS) ×1
CANISTER SUCTION 2500CC (MISCELLANEOUS) ×2 IMPLANT
CLIP TI MEDIUM 6 (CLIP) ×2 IMPLANT
CLIP TI WIDE RED SMALL 6 (CLIP) ×3 IMPLANT
CLOTH BEACON ORANGE TIMEOUT ST (SAFETY) ×2 IMPLANT
COVER SURGICAL LIGHT HANDLE (MISCELLANEOUS) ×2 IMPLANT
DERMABOND ADVANCED (GAUZE/BANDAGES/DRESSINGS) ×1
DERMABOND ADVANCED .7 DNX12 (GAUZE/BANDAGES/DRESSINGS) ×1 IMPLANT
DRAPE INCISE IOBAN 66X45 STRL (DRAPES) ×2 IMPLANT
ELECT REM PT RETURN 9FT ADLT (ELECTROSURGICAL) ×2
ELECTRODE REM PT RTRN 9FT ADLT (ELECTROSURGICAL) ×1 IMPLANT
GLOVE BIO SURGEON STRL SZ 6.5 (GLOVE) ×3 IMPLANT
GLOVE BIO SURGEON STRL SZ7.5 (GLOVE) ×2 IMPLANT
GLOVE BIOGEL PI IND STRL 6.5 (GLOVE) IMPLANT
GLOVE BIOGEL PI IND STRL 7.0 (GLOVE) IMPLANT
GLOVE BIOGEL PI IND STRL 8 (GLOVE) ×1 IMPLANT
GLOVE BIOGEL PI INDICATOR 6.5 (GLOVE) ×2
GLOVE BIOGEL PI INDICATOR 7.0 (GLOVE) ×1
GLOVE BIOGEL PI INDICATOR 8 (GLOVE) ×1
GLOVE ECLIPSE 7.5 STRL STRAW (GLOVE) ×1 IMPLANT
GOWN STRL NON-REIN LRG LVL3 (GOWN DISPOSABLE) ×4 IMPLANT
GRAFT GORETEX STRT 4-7X45 (Vascular Products) ×1 IMPLANT
KIT BASIN OR (CUSTOM PROCEDURE TRAY) ×2 IMPLANT
KIT ROOM TURNOVER OR (KITS) ×2 IMPLANT
NS IRRIG 1000ML POUR BTL (IV SOLUTION) ×2 IMPLANT
PACK CV ACCESS (CUSTOM PROCEDURE TRAY) ×1 IMPLANT
PACK PERIPHERAL VASCULAR (CUSTOM PROCEDURE TRAY) ×1 IMPLANT
PAD ARMBOARD 7.5X6 YLW CONV (MISCELLANEOUS) ×4 IMPLANT
SPONGE SURGIFOAM ABS GEL 100 (HEMOSTASIS) IMPLANT
SUT PROLENE 6 0 BV (SUTURE) ×5 IMPLANT
SUT VIC AB 2-0 CTB1 (SUTURE) ×2 IMPLANT
SUT VIC AB 3-0 SH 27 (SUTURE) ×4
SUT VIC AB 3-0 SH 27X BRD (SUTURE) ×2 IMPLANT
SUT VICRYL 4-0 PS2 18IN ABS (SUTURE) ×4 IMPLANT
TOWEL OR 17X24 6PK STRL BLUE (TOWEL DISPOSABLE) ×2 IMPLANT
TOWEL OR 17X26 10 PK STRL BLUE (TOWEL DISPOSABLE) ×2 IMPLANT
UNDERPAD 30X30 INCONTINENT (UNDERPADS AND DIAPERS) ×1 IMPLANT
WATER STERILE IRR 1000ML POUR (IV SOLUTION) ×2 IMPLANT

## 2013-04-07 NOTE — Transfer of Care (Signed)
Immediate Anesthesia Transfer of Care Note  Patient: [name]  Procedure(s) Performed: Procedure(s): INSERTION OF ARTERIOVENOUS (AV) GORE-TEX GRAFT THIGH (Right)  Patient Location: PACU  Anesthesia Type:General  Level of Consciousness: sedated and responds to stimulation  Airway & Oxygen Therapy: Patient Spontanous Breathing and Patient connected to nasal cannula oxygen  Post-op Assessment: Report given to PACU RN and Post -op Vital signs reviewed and stable  Post vital signs: Reviewed and stable  Complications: No apparent anesthesia complications

## 2013-04-07 NOTE — Progress Notes (Signed)
04/07/2013 patient refuse to use bed alarm. [facility] RN.

## 2013-04-07 NOTE — Anesthesia Postprocedure Evaluation (Signed)
  Anesthesia Post-op Note  Patient: [name]  Procedure(s) Performed: Procedure(s): INSERTION OF ARTERIOVENOUS (AV) GORE-TEX GRAFT THIGH (Right)  Patient Location: PACU  Anesthesia Type:General  Level of Consciousness: awake, alert  and oriented  Airway and Oxygen Therapy: Patient Spontanous Breathing  Post-op Pain: mild  Post-op Assessment: Post-op Vital signs reviewed, Patient's Cardiovascular Status Stable, Respiratory Function Stable, Patent Airway and Pain level controlled  Post-op Vital Signs: stable  Complications: No apparent anesthesia complications

## 2013-04-07 NOTE — OR Nursing (Signed)
White underwear removed in OR suite prior to prepping. Placed in biohazard bag with patient label; will transport to PACU with patient post-operatively.

## 2013-04-07 NOTE — Interval H&P Note (Signed)
History and Physical Interval Note:  04/07/2013 12:13 PM  [name]  has presented today for surgery, with the diagnosis of End Stage Renal Disease  The various methods of treatment have been discussed with the patient and family. After consideration of risks, benefits and other options for treatment, the patient has consented to  Procedure(s): INSERTION OF ARTERIOVENOUS (AV) GORE-TEX GRAFT THIGH (Right) as a surgical intervention .  The patient's history has been reviewed, patient examined, no change in status, stable for surgery.  I have reviewed the patient's chart and labs.  Questions were answered to the patient's satisfaction.     DICKSON,CHRISTOPHER S

## 2013-04-07 NOTE — Progress Notes (Signed)
Family Medicine Teaching Service Attending Note  I interviewed and examined patient [name] and reviewed their tests and x-rays.  I discussed with Dr. Jarvis Newcomer and reviewed their note for today.  I agree with their assessment and plan.

## 2013-04-07 NOTE — Progress Notes (Addendum)
Family Medicine Teaching Service Daily Progress Note Intern Pager: [phone]  Patient name: [name] Medical record number: [id number] Date of birth: November 12, 1980 Age: 32 y.o. Gender: female  Primary Care Provider: No primary provider on file. Consultants: Nephrology, Vascular Surgery Code Status: Full  Pt Overview and Major Events to Date:  04/01/13: Vein mapping showed bilateral single brachial vein DVT's.  04/01/13: Right IJ HD cath placed and hemodialysis started  04/02/13: Coumadin started with heparin bridge  04/04/13: Venogram showed no upper extremity DVTs, pt still not candidate for upper extremity access 04/06/13: Isolated hyperglycemic episode 04/07/13: Lower extremity HD access placement  Assessment and Plan:  # Chronic renal failure: creatinine 6.87 on admission, was 6.36 on 6/17, 1.4 in January 2013. Diabetic +/- hypertensive nephropath - To OR today for femoral access - Will continue to follow renal recs regarding dialysis.  - follow daily weights and I/Os  - renal U/S unremarkable, ANA negative, SPEP no M spike   # Upper extremity DVT:  - Negative venogram suspected to be due to occlusive clot bypass - Contraindication to upper extremity AVF/graft - Currently reversing anti-coagulation for lower extremity access placement  [ ]  resume anticoagulation following surgery today  # DM:  - currently on Lantus 12 units qHS, which is home dose. - Hyperglycemic episode (>600) yesterday treated with 26 units insulin -> 86.  - moderate sliding scale, requiring 23 units/24hrs, with CBG's >180.  - A1c 8.4   # HTN: BP's stable  - continue amlodipine 10mg  with prn IV hydralazine  - dialysis will also likely help with HTN   # N/V:  - Improving, likely 2/2 uremia - IV zofran prn   # Hypokalemia: resolved, correcting per dialysis prn   # HLD:  - recently had lipids checked at PCP's office. Defer treatment of lipids to PCP.   # Hypothyroidism:  - previous radionucleotide  ablation from Graves Disease per EMR  - TSH 265 with low T3 & T4: synthroid 50 mcg daily started 04/02/13 [ ]  needs f/u TSH in 6 weeks for titration  # Constipation: Resolved  # FEN/GI:  - SLIV  - carb modified diet  - NOT a candidate for PICC. If loses IV access, needs central line if in need of continuous IV medications   # Prophylaxis:  - currently on anticoagulation (heparin bridge to coumadin)   Disposition:  Pending setting up outpatient dialysis, therapeutic INR, permanent dialysis access  Subjective: Pt without complaints at this time. No further feelings of neck tightness, diaphoresis.   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 MAEW. Strength 5/5 Derm: dialysis catheter insertion site c/d/i below R clavicle.   Laboratory:  Recent Labs Lab 04/05/13 0543 04/06/13 0721 04/07/13 0645  WBC 7.2 6.1 6.7  HGB 9.3* 9.4* 8.9*  HCT 26.6* 27.1* 25.2*  PLT 193 212 138*    Recent Labs Lab 04/03/13 0116 04/04/13 0600 04/04/13 1745 04/06/13 0721  NA 134* 139  --  135  K 3.2* 3.5  --  3.6  CL 100 106  --  100  CO2 24 24  --  23  BUN 40* 43*  --  29*  CREATININE 6.64* 7.37*  --  6.07*  CALCIUM 7.7* 8.3* 7.7* 8.3*  PROT  --   --   --  5.5*  BILITOT  --   --   --  0.1*  ALKPHOS  --   --   --  52  ALT  --   --   --  10  AST  --   --   --  19  GLUCOSE 231* 100*  --  174*    Imaging/Diagnostic Tests:  6/27 Vein map: shows thrombus in bilateral brachial veins. Will defer  to medical service as to treat this with anticoagulation. Patient will need bilateral venogram before access plan finalized.   6/30 Venogram: On the left arm there was opacification from a branch into the brachial vein at the antecubital space. The vein was small but was patent throughout the course to include the subclavian vein and innominate vein. There was no evidence of clot in the brachial vein  The left arm venogram showed a venous structures throughout the arm. Again the veins were small but the brachial vein was patent with no evidence of thrombus. This was a patent through the axilla subclavian and innominate vein on the left as well.  Findings bilateral upper extremity venograms with no evidence of thrombus or occlusion. Patent central veins bilaterally.   Hazeline Junker, MD 04/07/2013, 8:24 AM PGY-1, [facility] Family Medicine FPTS Intern pager: [phone], text pages welcome

## 2013-04-07 NOTE — OR Nursing (Signed)
Dr Noreene Larsson to sign out pt

## 2013-04-07 NOTE — Op Note (Signed)
NAME: [name]   MRN: [id number] DOB: 09/29/1981    DATE OF OPERATION: 04/07/2013  PREOP DIAGNOSIS: chronic kidney disease  POSTOP DIAGNOSIS: same  PROCEDURE: placement of right thigh AV graft  SURGEON: Di Kindle. Edilia Bo, MD, FACS  ASSIST: Doreatha Massed,  PA  ANESTHESIA: Gen.   EBL: minimal  INDICATIONS: [name] is a 32 y.o. female who presents for access. She has bilateral upper extremity DVTs. It was felt that we should wait 3 months before considering an upper arm graft and therefore nephrology asked [place] to place a thigh graft.  FINDINGS: the saphenous vein was fairly small. It was approximately 4.5 mm. The arterial anastomosis was to the distal common femoral artery.  TECHNIQUE: The patient was taken to the operating room and received a general anesthetic. The right thigh was prepped and draped in usual sterile fashion. A longitudinal incision was made in the right groin. Through this incision the common femoral, superficial femoral, and deep femoral arteries were dissected free and controlled with vessel loops. The saphenofemoral junction was dissected free. I dissected below this onto the saphenous vein which was fairly small. Using one distal counterincision, a 47 mm tapered PTFE graft was tunneled in a loop fashion in the thigh. The patient was heparinized. The common femoral artery, superficial femoral artery, and deep femoral arteries were controlled. A longitudinal arteriotomy was made in the distal common femoral artery. A short segment of the 4 mm the graft was excised. The graft was slightly spatulated and sewn end to side to the artery using continuous 6-0 Prolene suture. The graft and pulled the appropriate length for anastomosis to the saphenous vein. Saphenous vein was ligated distally and spatulated proximally. It did take a 5 mm dilator. The graft was spatulated and sewn end to end to the vein using continuous 6-0 Prolene suture. At the completion was a good  thrill in the graft. The heparin was partially reversed with protamine. The distal counterincision was closed with a deeper 3-0 Vicryl and the skin closed with 4-0 Vicryl. One incision was closed with 2 deep layers of 2-0 Vicryl and the skin closed with 40 septic or stitch. Dermabond was applied. The patient tolerated the procedure well and was transferred to the recovery room in stable condition. All needle and sponge counts were correct.   Waverly Ferrari, MD, FACS [facility]  DATE OF DICTATION:   04/07/2013

## 2013-04-07 NOTE — Progress Notes (Signed)
Subjective: Interval History: has no complaint. No questions about access @ this time  Objective: Vital signs in last 24 hours: Temp:  [97.6 F (36.4 C)-98.7 F (37.1 C)] 98.4 F (36.9 C) (07/03 0440) Pulse Rate:  [78-98] 85 (07/03 0440) Resp:  [17-20] 20 (07/03 0440) BP: (117-154)/(71-100) 154/82 mmHg (07/03 0440) SpO2:  [100 %] 100 % (07/03 0440) Weight:  [65.3 kg (143 lb 15.4 oz)] 65.3 kg (143 lb 15.4 oz) (07/02 1012) Weight change: -0.646 kg (-1 lb 6.8 oz)  Intake/Output from previous day: 07/02 0701 - 07/03 0700 In: 1431.7 [P.O.:240; I.V.:583.8; Blood:607.9] Out: 1850  Intake/Output this shift:    General appearance: alert, cooperative and mildly obese Resp: clear to auscultation bilaterally Cardio: S1, S2 normal and systolic murmur: holosystolic 2/6, blowing at apex GI: pos bs,soft,liver Extremities: extremities normal, atraumatic, no cyanosis or edema  Lab Results:  Recent Labs  04/06/13 0721 04/07/13 0645  WBC 6.1 6.7  HGB 9.4* 8.9*  HCT 27.1* 25.2*  PLT 212 138*   BMET:  Recent Labs  04/06/13 0721 04/07/13 0645  NA 135 132*  K 3.6 4.2  CL 100 96  CO2 23 21  GLUCOSE 174* 269*  BUN 29* 25*  CREATININE 6.07* 5.22*  CALCIUM 8.3* 8.4    Recent Labs  04/04/13 1745 04/05/13 1145  PTH 352.4* 146.0*   Iron Studies:  Recent Labs  04/04/13 1745  IRON 57  TIBC 184*    Studies/Results: Dg Chest Port 1 View  04/06/2013   *RADIOLOGY REPORT*  Clinical Data: Diaphoresis and fatigue.  PORTABLE CHEST - 1 VIEW  Comparison: 04/01/2013.  Findings: The diatek catheter is stable.  The cardiac silhouette, mediastinal and hilar contours are within normal limits and unchanged.  Mild central vascular congestion without overt pulmonary edema.  No pleural effusion.  The bony thorax is intact.  IMPRESSION: Mild vascular congestion but no edema or effusions.   Original Report Authenticated By: Rudie Meyer, M.D.    I have reviewed the patient's current  medications.  Assessment/Plan: 1 ESRD for HD in am.  To get access today 2 Anemia stable 3 Dm controlled 4 HTN controlled P HD, access, can D/C in am if stable.  Set up at [facility]    LOS: 8 days   Rhylee Nunn L 04/07/2013,8:56 AM

## 2013-04-07 NOTE — Anesthesia Preprocedure Evaluation (Addendum)
Anesthesia Evaluation  Patient identified by MRN, date of birth, ID band Patient awake    Reviewed: Allergy & Precautions, H&P , NPO status , Patient's Chart, lab work & pertinent test results, reviewed documented beta blocker date and time   History of Anesthesia Complications Negative for: history of anesthetic complications  Airway Mallampati: II      Dental  (+) Teeth Intact and Dental Advisory Given   Pulmonary  breath sounds clear to auscultation        Cardiovascular hypertension, Pt. on medications + Peripheral Vascular Disease Rhythm:Regular Rate:Normal     Neuro/Psych    GI/Hepatic   Endo/Other  diabetes, Insulin DependentHypothyroidism   Renal/GU CRF and DialysisRenal disease     Musculoskeletal   Abdominal   Peds  Hematology   Anesthesia Other Findings   Reproductive/Obstetrics                          Anesthesia Physical Anesthesia Plan  ASA: III  Anesthesia Plan: General   Post-op Pain Management:    Induction: Intravenous  Airway Management Planned: LMA  Additional Equipment:   Intra-op Plan:   Post-operative Plan: Extubation in OR  Informed Consent: I have reviewed the patients History and Physical, chart, labs and discussed the procedure including the risks, benefits and alternatives for the proposed anesthesia with the patient or authorized representative who has indicated his/her understanding and acceptance.   Dental advisory given  Plan Discussed with: CRNA  Anesthesia Plan Comments: (ESRD recently started HD last HD 7/2  K 4.2 Type 1 DM glucose 269 Blindness due Diabetic retinopathy htn  Plan GA with LMA  Kipp Brood, MD)       Anesthesia Quick Evaluation

## 2013-04-07 NOTE — Progress Notes (Signed)
ANTICOAGULATION CONSULT NOTE - Follow Up Consult  Pharmacy Consult for heparin Indication: Bilateral brachial vein thrombosis   No Known Allergies  Patient Measurements: Height: 5' 2.5" (158.8 cm) Weight: 143 lb 15.4 oz (65.3 kg) IBW/kg (Calculated) : 51.25 Heparin Dosing Weight: 65kg  Vital Signs: Temp: 97.3 F (36.3 C) (07/03 1520) Temp src: Oral (07/03 0440) BP: 140/73 mmHg (07/03 1515) Pulse Rate: 90 (07/03 1515)  Labs:  Recent Labs  04/05/13 0543 04/06/13 0721 04/06/13 1400 04/06/13 1707 04/07/13 0040 04/07/13 0645  HGB 9.3* 9.4*  --   --   --  8.9*  HCT 26.6* 27.1*  --   --   --  25.2*  PLT 193 212  --   --   --  138*  LABPROT 25.0*  --  23.4*  --   --  14.0  INR 2.36*  --  2.16*  --   --  1.10  HEPARINUNFRC 0.41  --  1.78*  --  0.21*  --   CREATININE  --  6.07*  --   --   --  5.22*  TROPONINI  --   --   --  <0.30  --   --     Estimated Creatinine Clearance: 14.3 ml/min (by C-G formula based on Cr of 5.22).   Medications:  Scheduled:  . acetaminophen      . amLODipine  10 mg Oral QHS  . ceFAZolin      . darbepoetin (ARANESP) injection - DIALYSIS  100 mcg Intravenous Q Wed-HD  . HYDROmorphone      . insulin aspart  0-15 Units Subcutaneous TID WC  . insulin aspart  3 Units Subcutaneous Once  . insulin glargine  12 Units Subcutaneous QHS  . levothyroxine  50 mcg Oral QAC breakfast  . multivitamin  1 tablet Oral Daily  . polyethylene glycol  17 g Oral TID  . senna-docusate  1 tablet Oral Daily  . sodium chloride  3 mL Intravenous Q12H   Infusions:  . heparin Stopped (04/07/13 0810)    Assessment: 32 yo female with bilateral brachial vein thrombosis will be restarted on heparin 8 hours after the end of surgery for placement of right thigh AV graft (which was around 1446 today).  INR today is 1.1, Hgb 8.9 and Plt 138 K.  Per MD, do not restart coumadin until 04/08/13.  Goal of Therapy:  Heparin level 0.3-0.7 units/ml Monitor platelets by  anticoagulation protocol: Yes   Plan:  1) Restart heparin at 800 units/hr at 2300 tonight 2) Check an 8 hours heparin level after drip is restarted 3) Daily heparin level and CBC  Jeniffer Culliver, Tsz-Yin 04/07/2013,3:50 PM

## 2013-04-07 NOTE — Telephone Encounter (Signed)
Message copied by Rosalyn Charters on Thu Apr 07, 2013  4:17 PM ------      Message from: Lorin Mercy K      Created: Thu Apr 07, 2013  3:33 PM      Regarding: schedule                   ----- Message -----         From: Dara Lords, PA-C         Sent: 04/07/2013   2:39 PM           To: Sharee Pimple, CMA            S/p right thigh graft 04/07/13.  F/u with [facility] in 2 weeks.            Thanks,      Lelon Mast ------

## 2013-04-07 NOTE — Progress Notes (Signed)
Patient refused to have bed alarm turned on,patient is alert and oriented x4 but blind.Explained to patient the reason why the bed alarm needs to be turned on.States "I've been going to the bathroom by myself,it's only a few steps away." Advised pt then to call for assist when going to bathroom,call bell within reach. [name],RN

## 2013-04-07 NOTE — Preoperative (Signed)
Beta Blockers   Reason not to administer Beta Blockers:Not Applicable. No home beta blockers 

## 2013-04-07 NOTE — Progress Notes (Signed)
ANTICOAGULATION CONSULT NOTE - Follow Up Consult  Pharmacy Consult for heparin Indication: bilateral brachial vein thrombosis  Labs:  Recent Labs  04/04/13 0600  04/05/13 0543 04/06/13 0721 04/06/13 1400 04/06/13 1707 04/07/13 0040  HGB 9.3*  --  9.3* 9.4*  --   --   --   HCT 27.0*  --  26.6* 27.1*  --   --   --   PLT 245  --  193 212  --   --   --   LABPROT 16.1*  --  25.0*  --  23.4*  --   --   INR 1.32  --  2.36*  --  2.16*  --   --   HEPARINUNFRC 0.55  < > 0.41  --  1.78*  --  0.21*  CREATININE 7.37*  --   --  6.07*  --   --   --   TROPONINI  --   --   --   --   --  <0.30  --   < > = values in this interval not displayed.   Assessment: 32yo female now subtherapeutic on heparin after rate decrease for high level with confirmatory lab and apparent correct lab draw.  Goal of Therapy:  Heparin level 0.3-0.7 units/ml   Plan:  Will increase heparin gtt cautiously to 800 units/hr and check level in 8hr.  Vernard Gambles, PharmD, BCPS  04/07/2013,1:36 AM

## 2013-04-07 NOTE — Care Management Note (Signed)
    Page 1 of 1   04/07/2013     4:18:13 PM   CARE MANAGEMENT NOTE 04/07/2013  Patient:  [name], [name]   Account Number:  [id number]  Date Initiated:  04/05/2013  Documentation initiated by:  Johny Shock  Subjective/Objective Assessment:     Action/Plan:   Following for d/c needs.  04/06/2013 Met with pt re d/c need, no HH needs identified, pt lives with partner has transportation and active with [facility] for travel to outpt hemodialysis.   Anticipated DC Date:  04/10/2013   Anticipated DC Plan:  HOME/SELF CARE         Choice offered to / List presented to:             Status of service:  In process, will continue to follow Medicare Important Message given?   (If response is "NO", the following Medicare IM given date fields will be blank) Date Medicare IM given:   Date Additional Medicare IM given:    Discharge Disposition:    Per UR Regulation:  Reviewed for med. necessity/level of care/duration of stay  If discussed at Long Length of Stay Meetings, dates discussed:    Comments:  04/07/13-1127-J.Sherilee Smotherman,RN,BSN [phone]     CM notified by [address] Director that patient has been discharged from [facility] and all associated dialysis centers for noncompliance. "Darel Hong in HD is working to get her in with [facility] in [place]." CM will continue to monitor.  04/06/2013 Met with pt who states that she has transportation to and from outpatient hemodialysis this will be provided by her partner or she will use [facility] as she is an acitve rider of [facility] at this time. No other needs voiced at this time. Johny Shock RN MPH  [phone]

## 2013-04-07 NOTE — H&P (View-Only) (Signed)
[facility] [place]  Subjective  -  After speaking with Dr. Detterding today, we have decided to proceed with femoral access.  I am reluctant to place an upper extremity access with known DVT   Physical Exam:  CV:  RRR PULM:  CTA EXT:  Palp pedal pulses    Assessment/Plan:  ESRD  Even though the venogram did not show thrombus, the U/S did.  Plus, the patient has a swollen left arm consistent with DVT.  I suspect the thrombus is occlusive, and therefore the dye on venogram took the path of least resistence and therefore did not show clot.  I would not place an upper arm AVGG for at least 3 months.  For that reason, after discussing this with the renal service, we will place a femoral access.  The patient was counseled on the risks and benefits including the risk of infection and steal as well as occlusion.  She is theraputicon her coumadin, so I will reverse it with FFP and Vit K, so that we can proceed tomorrow  BRABHAM IV, V. WELLS 04/06/2013 9:33 PM --  Filed Vitals:   04/06/13 2100  BP: 136/94  Pulse: 93  Temp: 98.2 F (36.8 C)  Resp: 17    Intake/Output Summary (Last 24 hours) at 04/06/13 2133 Last data filed at 04/06/13 2100  Gross per 24 hour  Intake 783.33 ml  Output   2350 ml  Net -1566.67 ml     Laboratory CBC    Component Value Date/Time   WBC 6.1 04/06/2013 0721   HGB 9.4* 04/06/2013 0721   HCT 27.1* 04/06/2013 0721   PLT 212 04/06/2013 0721    BMET    Component Value Date/Time   NA 135 04/06/2013 0721   K 3.6 04/06/2013 0721   CL 100 04/06/2013 0721   CO2 23 04/06/2013 0721   GLUCOSE 174* 04/06/2013 0721   BUN 29* 04/06/2013 0721   CREATININE 6.07* 04/06/2013 0721   CALCIUM 8.3* 04/06/2013 0721   CALCIUM 7.7* 04/04/2013 1745   GFRNONAA 9* 04/06/2013 0721   GFRAA 10* 04/06/2013 0721    COAG Lab Results  Component Value Date   INR 2.16* 04/06/2013   INR 2.36* 04/05/2013   INR 1.32 04/04/2013   No results found for this basename: PTT     Antibiotics Anti-infectives   Start     Dose/Rate Route Frequency Ordered Stop   04/01/13 1000  ceFAZolin (ANCEF) IVPB 2 g/50 mL premix     2 g 100 mL/hr over 30 Minutes Intravenous On call 04/01/13 0945 04/01/13 1141   04/01/13 0945  ceFAZolin (ANCEF) IVPB 1 g/50 mL premix  Status:  Discontinued    Comments:  Send with pt to OR   1 g 100 mL/hr over 30 Minutes Intravenous On call 04/01/13 0943 04/01/13 0944       V. Charlena Cross, M.D. [facility] [place] Office: [phone] Pager:  [phone]

## 2013-04-08 LAB — CBC
HCT: 23.3 % — ABNORMAL LOW (ref 36.0–46.0)
Hemoglobin: 8 g/dL — ABNORMAL LOW (ref 12.0–15.0)
MCH: 29.7 pg (ref 26.0–34.0)
MCHC: 34.3 g/dL (ref 30.0–36.0)

## 2013-04-08 LAB — GLUCOSE, CAPILLARY
Glucose-Capillary: 244 mg/dL — ABNORMAL HIGH (ref 70–99)
Glucose-Capillary: 299 mg/dL — ABNORMAL HIGH (ref 70–99)
Glucose-Capillary: 313 mg/dL — ABNORMAL HIGH (ref 70–99)

## 2013-04-08 LAB — RENAL FUNCTION PANEL
Albumin: 1.6 g/dL — ABNORMAL LOW (ref 3.5–5.2)
BUN: 28 mg/dL — ABNORMAL HIGH (ref 6–23)
Chloride: 98 mEq/L (ref 96–112)
Glucose, Bld: 309 mg/dL — ABNORMAL HIGH (ref 70–99)
Potassium: 3.8 mEq/L (ref 3.5–5.1)

## 2013-04-08 MED ORDER — SODIUM CHLORIDE 0.9 % IV SOLN: 100.0000 mL | INTRAVENOUS | Status: DC | PRN

## 2013-04-08 MED ORDER — WARFARIN SODIUM 10 MG PO TABS
10.0000 mg | ORAL_TABLET | Freq: Once | ORAL | Status: AC
  Administered 2013-04-08: 10 mg via ORAL
  Filled 2013-04-08: qty 1

## 2013-04-08 MED ORDER — LISINOPRIL-HYDROCHLOROTHIAZIDE 10-12.5 MG PO TABS: 1.0000 | ORAL_TABLET | Freq: Every day | ORAL | Status: DC

## 2013-04-08 MED ORDER — LISINOPRIL 10 MG PO TABS
10.0000 mg | ORAL_TABLET | Freq: Every day | ORAL | Status: DC
  Administered 2013-04-08: 10 mg via ORAL
  Filled 2013-04-08 (×2): qty 1

## 2013-04-08 MED ORDER — WARFARIN - PHYSICIAN DOSING INPATIENT: Freq: Every day | Status: DC

## 2013-04-08 MED ORDER — PENTAFLUOROPROP-TETRAFLUOROETH EX AERO: 1.0000 "application " | INHALATION_SPRAY | CUTANEOUS | Status: DC | PRN

## 2013-04-08 MED ORDER — HYDROCHLOROTHIAZIDE 12.5 MG PO CAPS
12.5000 mg | ORAL_CAPSULE | Freq: Every day | ORAL | Status: DC
  Administered 2013-04-08: 12.5 mg via ORAL
  Filled 2013-04-08 (×2): qty 1

## 2013-04-08 MED ORDER — WARFARIN - PHARMACIST DOSING INPATIENT: Freq: Every day | Status: DC

## 2013-04-08 MED ORDER — ALTEPLASE 2 MG IJ SOLR: 2.0000 mg | Freq: Once | INTRAMUSCULAR | Status: AC | PRN

## 2013-04-08 MED ORDER — HEPARIN SODIUM (PORCINE) 1000 UNIT/ML DIALYSIS: 40.0000 [IU]/kg | Freq: Once | INTRAMUSCULAR | Status: DC

## 2013-04-08 MED ORDER — OXYCODONE HCL 5 MG PO TABS: 5.0000 mg | ORAL_TABLET | Freq: Four times a day (QID) | ORAL | Status: DC | PRN

## 2013-04-08 MED ORDER — LIDOCAINE HCL (PF) 1 % IJ SOLN: 5.0000 mL | INTRAMUSCULAR | Status: DC | PRN

## 2013-04-08 MED ORDER — INSULIN ASPART 100 UNIT/ML ~~LOC~~ SOLN
3.0000 [IU] | Freq: Once | SUBCUTANEOUS | Status: AC
  Administered 2013-04-08: 3 [IU] via SUBCUTANEOUS

## 2013-04-08 MED ORDER — LIDOCAINE-PRILOCAINE 2.5-2.5 % EX CREA: 1.0000 "application " | TOPICAL_CREAM | CUTANEOUS | Status: DC | PRN

## 2013-04-08 MED ORDER — WARFARIN SODIUM 7.5 MG PO TABS
7.5000 mg | ORAL_TABLET | Freq: Once | ORAL | Status: DC
  Filled 2013-04-08: qty 1

## 2013-04-08 MED ORDER — NEPRO/CARBSTEADY PO LIQD: 237.0000 mL | ORAL | Status: DC | PRN

## 2013-04-08 MED ORDER — ENOXAPARIN SODIUM 80 MG/0.8ML ~~LOC~~ SOLN
70.0000 mg | SUBCUTANEOUS | Status: DC
  Administered 2013-04-08: 70 mg via SUBCUTANEOUS
  Filled 2013-04-08 (×2): qty 0.8

## 2013-04-08 MED ORDER — HEPARIN SODIUM (PORCINE) 1000 UNIT/ML DIALYSIS: 1000.0000 [IU] | INTRAMUSCULAR | Status: DC | PRN

## 2013-04-08 NOTE — Progress Notes (Signed)
  VASCULAR AND VEIN SURGERY PROGRESS NOTE  POST-OP HEMODIALYSIS ACCESS  Date of Surgery: 03/30/2013 - 04/07/2013 Surgeon: Moishe Spice): Chuck Hint, MD 1 Day Post-Op Right Procedure(s): INSERTION OF ARTERIOVENOUS (AV) GORE-TEX GRAFT THIGH   HPI: [name] is a 32 y.o. female who is 1 Day Post-Op creation/revision of right lower extremity Hemodialysis access. The patient denies symptoms of numbness, tingling, weakness; denies pain in the operative limb.   Significant Diagnostic Studies: CBC Lab Results  Component Value Date   WBC 6.7 04/07/2013   HGB 8.9* 04/07/2013   HCT 25.2* 04/07/2013   MCV 84.3 04/07/2013   PLT 138* 04/07/2013    BMET    Component Value Date/Time   NA 132* 04/07/2013 0645   K 4.2 04/07/2013 0645   CL 96 04/07/2013 0645   CO2 21 04/07/2013 0645   GLUCOSE 269* 04/07/2013 0645   BUN 25* 04/07/2013 0645   CREATININE 5.22* 04/07/2013 0645   CALCIUM 8.4 04/07/2013 0645   CALCIUM 7.7* 04/04/2013 1745   GFRNONAA 10* 04/07/2013 0645   GFRAA 12* 04/07/2013 0645    COAG Lab Results  Component Value Date   INR 1.10 04/07/2013   INR 2.16* 04/06/2013   INR 2.36* 04/05/2013   No results found for this basename: PTT    Vital Signs  BP Readings from Last 3 Encounters:  04/08/13 126/67  04/08/13 126/67  04/08/13 126/67   Temp Readings from Last 3 Encounters:  04/08/13 97.5 F (36.4 C) Oral  04/08/13 97.5 F (36.4 C) Oral  04/08/13 97.5 F (36.4 C) Oral   SpO2 Readings from Last 3 Encounters:  04/08/13 99%  04/08/13 99%  04/08/13 99%   Pulse Readings from Last 3 Encounters:  04/08/13 85  04/08/13 85  04/08/13 85     Physical Examination  right lower Incision is healing well, skin color is normal , , sensation in digits is intact;  There is a good thrill and good bruit in the right thigh AVGG. 2+ palp DP pulse on right  Assessment/Plan [name] is a 32 y.o. year old female who presents s/p creation/revision of right lower extremity Hemodialysis  access. Follow-up in 2 weeks  The patient's access will be ready for use in 4 weeks.  Pt ambulating without diff  No signs of steal right leg  DC per med sx  Pt will need to be off coumadin for several days when she comes in to get HD cath out  Rainey Kahrs J 04/08/2013 10:00 AM

## 2013-04-08 NOTE — Progress Notes (Signed)
Family Medicine Teaching Service Attending Note  I interviewed and examined patient [name] and reviewed their tests and x-rays.  I discussed with Dr. Jarvis Newcomer and reviewed their note for today.  I agree with their assessment and plan.     Additionally  Feels well Medically stable

## 2013-04-08 NOTE — Procedures (Signed)
I was present at this session.  I have reviewed the session itself and made appropriate changes.  Bp tol vol off. Using PC  [name] 7/4/20144:17 PM

## 2013-04-08 NOTE — Progress Notes (Signed)
04/07/13 19:30 Patient still refused to have bed alarm turned on,pt is blind and is a high fall risk.Provided with soft touch call bell but she prefers the regular call bell,states "it has  Braille,the other one go off a lot". Advised to call before getting up to go to bathroom and for any assist. Khristie Sak Joselita,RN

## 2013-04-08 NOTE — Progress Notes (Signed)
ANTICOAGULATION CONSULT NOTE - Follow Up Consult  Pharmacy Consult for heparin and coumadin Indication: Bilateral brachial vein thrombosis   No Known Allergies  Patient Measurements: Height: 5' 2.5" (158.8 cm) Weight: 143 lb 15.4 oz (65.3 kg) IBW/kg (Calculated) : 51.25 Heparin Dosing Weight: 65 kg  Vital Signs: Temp: 98.1 F (36.7 C) (07/04 0945) Temp src: Oral (07/04 0945) BP: 129/71 mmHg (07/04 0945) Pulse Rate: 83 (07/04 0945)  Labs:  Recent Labs  04/06/13 0721 04/06/13 1400 04/06/13 1707 04/07/13 0040 04/07/13 0645 04/08/13 1017  HGB 9.4*  --   --   --  8.9*  --   HCT 27.1*  --   --   --  25.2*  --   PLT 212  --   --   --  138*  --   LABPROT  --  23.4*  --   --  14.0  --   INR  --  2.16*  --   --  1.10  --   HEPARINUNFRC  --  1.78*  --  0.21*  --  0.13*  CREATININE 6.07*  --   --   --  5.22*  --   TROPONINI  --   --  <0.30  --   --   --     Estimated Creatinine Clearance: 14.3 ml/min (by C-G formula based on Cr of 5.22).  Assessment: Patient is a 32 y.o F s/p AV graft placement on 7/3 with heparin resumed last night for bilatreal brachial vein thrombosis.  Heparin level now back subtherapeutic at 0.13. Per RN, no issues with IV line. Spoke to [employer] from [facility], no issues with surgical site, ok to resume coumadin today.  Patient was reversed with FFP and vit K prior to procedure so will anticipate some time before we can get INR back to within goal range.   Goal of Therapy:  Heparin level 0.3-0.7 units/ml; INR 2-3 Monitor platelets by anticoagulation protocol: Yes   Plan:  1) increase heparin drip to 900 units/hr-- being conservative with rate change since pt's levels tend to fluctuate easily 2) check 8 hour heparin level 3) coumadin 7.5mg  PO x1 today  Myda Detwiler P 04/08/2013,12:04 PM

## 2013-04-08 NOTE — Progress Notes (Signed)
Subjective: Interval History: has no complaint of leg bothering.  Objective: Vital signs in last 24 hours: Temp:  [97.3 F (36.3 C)-99.2 F (37.3 C)] 97.5 F (36.4 C) (07/04 0444) Pulse Rate:  [75-96] 85 (07/04 0444) Resp:  [15-20] 18 (07/04 0444) BP: (121-140)/(67-84) 126/67 mmHg (07/04 0444) SpO2:  [98 %-100 %] 99 % (07/04 0444) Weight change:   Intake/Output from previous day: 07/03 0701 - 07/04 0700 In: 997.7 [P.O.:220; I.V.:777.7] Out: 400 [Urine:400] Intake/Output this shift:    General appearance: alert, cooperative and moderately obese Resp: clear to auscultation bilaterally Cardio: S1, S2 normal GI: soft, non-tender; bowel sounds normal; no masses,  no organomegaly Extremities: AVG R groin B&T, leg warm HD cath  Lab Results:  Recent Labs  04/06/13 0721 04/07/13 0645  WBC 6.1 6.7  HGB 9.4* 8.9*  HCT 27.1* 25.2*  PLT 212 138*   BMET:  Recent Labs  04/06/13 0721 04/07/13 0645  NA 135 132*  K 3.6 4.2  CL 100 96  CO2 23 21  GLUCOSE 174* 269*  BUN 29* 25*  CREATININE 6.07* 5.22*  CALCIUM 8.3* 8.4    Recent Labs  04/05/13 1145  PTH 146.0*   Iron Studies: No results found for this basename: IRON, TIBC, TRANSFERRIN, FERRITIN,  in the last 72 hours  Studies/Results: Dg Chest Port 1 View  04/06/2013   *RADIOLOGY REPORT*  Clinical Data: Diaphoresis and fatigue.  PORTABLE CHEST - 1 VIEW  Comparison: 04/01/2013.  Findings: The diatek catheter is stable.  The cardiac silhouette, mediastinal and hilar contours are within normal limits and unchanged.  Mild central vascular congestion without overt pulmonary edema.  No pleural effusion.  The bony thorax is intact.  IMPRESSION: Mild vascular congestion but no edema or effusions.   Original Report Authenticated By: Rudie Meyer, M.D.    I have reviewed the patient's current medications.  Assessment/Plan: 1 ESRD stable for HD, perm access ok. Can d/c home on Coumadin 2 ? DVT 3 mon coumadin 3 Anemia epo/fe 4  HPTH Vit D 5 DM controlled 6 HTN controlled P HD, epo. / D/C per primary    LOS: 9 days   Janalee Grobe L 04/08/2013,10:21 AM

## 2013-04-08 NOTE — Progress Notes (Addendum)
Family Medicine Teaching Service Daily Progress Note Intern Pager: [phone]  Patient name: [name] Medical record number: [id number] Date of birth: 09-29-1981 Age: 32 y.o. Gender: female  Primary Care Provider: No primary provider on file. Consultants: Nephrology, Vascular Surgery Code Status: Full  Pt Overview and Major Events to Date:  04/01/13: Vein mapping showed bilateral single brachial vein DVT's.  04/01/13: Right IJ HD cath placed and hemodialysis started  04/02/13: Coumadin started with heparin bridge  04/04/13: Venogram showed no upper extremity DVTs, pt still not candidate for upper extremity access 04/06/13: Isolated hyperglycemic episode 04/07/13: Lower extremity HD access placement 04/08/13: D/C after HD today. Accepted to [facility] MWF  Assessment and Plan:  # Chronic renal failure: creatinine 6.87 on admission, was 6.36 on 6/17, 1.4 in January 2013. Diabetic +/- hypertensive nephropath - AVG placed 7/3 - Will continue to follow renal recs regarding dialysis.  - follow daily weights and I/Os  [ ]  verify dialysis set up as outpt with case management.   # Upper extremity DVT:  - Negative venogram suspected to be due to occlusive clot bypass - Contraindication to upper extremity AVF/graft - Currently reversing anti-coagulation for lower extremity access placement  [ ]  resume anticoagulation today   # DM:  - currently on Lantus 12 units qHS, which is home dose. - Hyperglycemic episode (>600) yesterday treated with 26 units insulin -> 86.  - moderate sliding scale, requiring 23 units/24hrs, with CBG's >180.  - A1c 8.4   # HTN: BP's stable  - continue amlodipine 10mg  with prn IV hydralazine  - dialysis will also likely help with HTN   # N/V:  - Improving, likely 2/2 uremia - IV zofran prn   # Hypokalemia: resolved, correcting per dialysis prn   # HLD:  - recently had lipids checked at PCP's office. Defer treatment of lipids to PCP.   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 If loses IV access, needs central line if in need of continuous IV medications   # Prophylaxis:  - On heparin  Disposition:  Pending setting up outpatient dialysis, therapeutic INR  Subjective: Pt without complaints at this time. No further feelings of neck tightness, diaphoresis.   Objective: Temp:  [97.3 F (36.3 C)-99.2 F (37.3 C)] 97.5 F (36.4 C) (07/04 0444) Pulse Rate:  [75-96] 85 (07/04 0444) Resp:  [15-20] 18 (07/04 0444) BP: (121-140)/(67-84) 126/67 mmHg (07/04 0444) SpO2:  [98 %-100 %] 99 % (07/04 0444) Physical Exam: General: 32 yo blind female in NAD Cardiovascular: rrr with 2/6 systolic murmur, no edema, no JVD Respiratory: CTA bilaterally nonlabored Abdomen: Soft, NT, ND Normoactive BS Extremities: No edema. MAEW. Strength 5/5 Derm: dialysis catheter insertion site c/d/i below R clavicle. Postoperative bandage on R thigh c/d/i  Laboratory:  Recent Labs Lab 04/05/13 0543 04/06/13 0721 04/07/13 0645  WBC 7.2 6.1 6.7  HGB 9.3* 9.4* 8.9*  HCT 26.6* 27.1* 25.2*  PLT 193 212 138*    Recent Labs Lab 04/04/13 0600 04/04/13 1745 04/06/13 0721 04/07/13 0645  NA 139  --  135 132*  K 3.5  --  3.6 4.2  CL 106  --  100 96  CO2  24  --  23 21  BUN 43*  --  29* 25*  CREATININE 7.37*  --  6.07* 5.22*  CALCIUM 8.3* 7.7* 8.3* 8.4  PROT  --   --  5.5*  --   BILITOT  --   --  0.1*  --   ALKPHOS  --   --  52  --   ALT  --   --  10  --   AST  --   --  19  --   GLUCOSE 100*  --  174* 269*    Imaging/Diagnostic Tests:  6/27 Vein map: shows thrombus in bilateral brachial veins. Will defer to medical service as to treat this with anticoagulation. Patient will need bilateral  venogram before access plan finalized.   6/30 Venogram: On the left arm there was opacification from a branch into the brachial vein at the antecubital space. The vein was small but was patent throughout the course to include the subclavian vein and innominate vein. There was no evidence of clot in the brachial vein  The left arm venogram showed a venous structures throughout the arm. Again the veins were small but the brachial vein was patent with no evidence of thrombus. This was a patent through the axilla subclavian and innominate vein on the left as well.  Findings bilateral upper extremity venograms with no evidence of thrombus or occlusion. Patent central veins bilaterally.   Hazeline Junker, MD 04/08/2013, 8:48 AM PGY-1, [facility] FPTS Intern pager: [phone], text pages welcome

## 2013-04-09 DIAGNOSIS — E039 Hypothyroidism, unspecified: Secondary | ICD-10-CM

## 2013-04-09 LAB — CBC
MCV: 87.6 fL (ref 78.0–100.0)
Platelets: 155 10*3/uL (ref 150–400)
RBC: 2.74 MIL/uL — ABNORMAL LOW (ref 3.87–5.11)
WBC: 6.7 10*3/uL (ref 4.0–10.5)

## 2013-04-09 LAB — GLUCOSE, CAPILLARY
Glucose-Capillary: 280 mg/dL — ABNORMAL HIGH (ref 70–99)
Glucose-Capillary: 304 mg/dL — ABNORMAL HIGH (ref 70–99)

## 2013-04-09 MED ORDER — AMLODIPINE BESYLATE 10 MG PO TABS: 10.0000 mg | ORAL_TABLET | Freq: Every day | ORAL | Status: DC

## 2013-04-09 MED ORDER — PRODIGY VOICE BLOOD GLUCOSE W/DEVICE KIT: PACK | Status: DC

## 2013-04-09 MED ORDER — ENOXAPARIN SODIUM 80 MG/0.8ML ~~LOC~~ SOLN: 70.0000 mg | SUBCUTANEOUS | Status: DC

## 2013-04-09 MED ORDER — LEVOTHYROXINE SODIUM 50 MCG PO TABS: 50.0000 ug | ORAL_TABLET | Freq: Every day | ORAL | Status: DC

## 2013-04-09 MED ORDER — GLUCOSE BLOOD VI STRP: ORAL_STRIP | Status: DC

## 2013-04-09 MED ORDER — WARFARIN SODIUM 2.5 MG PO TABS: 7.5000 mg | ORAL_TABLET | Freq: Every day | ORAL | Status: DC

## 2013-04-09 MED ORDER — PRODIGY LANCETS 26G MISC: Status: DC

## 2013-04-09 NOTE — Progress Notes (Signed)
I have reviewed this discharge summary and agree.    

## 2013-04-09 NOTE — Progress Notes (Signed)
Family Medicine Teaching Service Daily Progress Note Intern Pager: [phone]  Patient name: [name] Medical record number: [id number] Date of birth: February 03, 1981 Age: 32 y.o. Gender: female  Primary Care Provider: No primary provider on file. Consultants: Nephrology, Vascular Surgery Code Status: Full  Pt Overview and Major Events to Date:  04/01/13: Vein mapping showed bilateral single brachial vein DVT's.  04/01/13: Right IJ HD cath placed and hemodialysis started  04/02/13: Coumadin started with heparin bridge  04/04/13: Venogram showed no upper extremity DVTs, pt still not candidate for upper extremity access 04/06/13: Isolated hyperglycemic episode 04/07/13: Lower extremity HD access placement 04/08/13: patient switched to lovenox  04/09/13: D/c today  Assessment and Plan:  # Chronic renal failure: creatinine 6.87 on admission, was 6.36 on 6/17, 1.4 in January 2013. Diabetic +/- hypertensive nephropath - AVG placed 7/3 - Putpatient dialysis set up for [employer].   # Upper extremity DVT:  - Negative venogram suspected to be due to occlusive clot bypass - Contraindication to upper extremity AVF/graft - Currently reversing anti-coagulation for lower extremity access placement  [ ]  D/C on lovenox 70 mg daily and coumadin 7.5 mg daily until f/u on Monday  # DM:  - currently on Lantus 12 units qHS, which is home dose. - Hyperglycemic episode (>600) yesterday treated with 26 units insulin -> 86.  - moderate sliding scale, requiring 23 units/24hrs, with CBG's >180.  - A1c 8.4   # HTN: BP's stable  - continue amlodipine 10mg  with prn IV hydralazine  - dialysis will also likely help with HTN   # N/V:  - Improving, likely 2/2 uremia - IV zofran prn   # Hypokalemia: resolved, correcting per dialysis prn   # HLD:  - recently had lipids checked at PCP's office. Defer treatment of lipids to PCP.   # Hypothyroidism:  - previous radionucleotide ablation from Graves Disease per EMR   - TSH 265 with low T3 & T4: synthroid 50 mcg daily started 04/02/13 [ ]  needs f/u TSH in 6 weeks for titration  # Constipation: Resolved  # FEN/GI:  - SLIV  - carb modified diet  - NOT a candidate for PICC. If loses IV access, needs central line if in need of continuous IV medications   # Prophylaxis:  - On lovenox  Disposition:  Appropriate for d/c today  Subjective: Pt without complaints at this time. Feels ready to leave.    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 MAEW. Strength 5/5 Derm: dialysis catheter insertion site c/d/i below R clavicle. Postoperative bandage on R thigh c/d/i  Laboratory:  Recent Labs Lab 04/07/13 0645 04/08/13 1300 04/09/13 0435  WBC 6.7 6.7 6.7  HGB 8.9* 8.0* 8.2*  HCT 25.2* 23.3* 24.0*  PLT 138* 144* 155    Recent Labs Lab 04/06/13 0721 04/07/13 0645 04/08/13 1348  NA 135 132* 131*  K 3.6 4.2 3.8  CL 100 96 98  CO2 23 21 23   BUN 29* 25* 28*  CREATININE 6.07*  5.22* 6.15*  CALCIUM 8.3* 8.4 7.6*  PROT 5.5*  --   --   BILITOT 0.1*  --   --   ALKPHOS 52  --   --   ALT 10  --   --   AST 19  --   --   GLUCOSE 174* 269* 309*    Imaging/Diagnostic Tests:  6/27 Vein map: shows thrombus in bilateral brachial veins. Will defer to medical service as to treat this with anticoagulation. Patient will need bilateral venogram before access plan finalized.   6/30 Venogram: On the left arm there was opacification from a branch into the brachial vein  at the antecubital space. The vein was small but was patent throughout the course to include the subclavian vein and innominate vein. There was no evidence of clot in the brachial vein  The left arm venogram showed a venous structures throughout the arm. Again the veins were small but the brachial vein was patent with no evidence of thrombus. This was a patent through the axilla subclavian and innominate vein on the left as well.  Findings bilateral upper extremity venograms with no evidence of thrombus or occlusion. Patent central veins bilaterally.   Garnetta Buddy, MD 04/09/2013, 7:24 AM PGY-3,  Family Medicine FPTS Intern pager: [phone], text pages welcome

## 2013-04-09 NOTE — Progress Notes (Signed)
Pt signed d/c papers. Pt acknowledges need for follow up appt and PCP. Instructions given on hotline to call for PCP and access to PCPs on hospital web-page. Discussed correct admin of lovenox injections, graft site care, and what to to if bleeding occurs. Pt demonstrates understanding, as does significant other. Pt encouraged on DM management and IV d/c'd. Room checked for belongings. Escorted to car via wheelchair accompanied by s/o and NT.

## 2013-04-11 ENCOUNTER — Encounter (HOSPITAL_COMMUNITY): Payer: Self-pay | Admitting: Vascular Surgery

## 2013-04-12 ENCOUNTER — Telehealth: Payer: Self-pay | Admitting: Family Medicine

## 2013-04-12 DIAGNOSIS — I82623 Acute embolism and thrombosis of deep veins of upper extremity, bilateral: Secondary | ICD-10-CM

## 2013-04-12 NOTE — Telephone Encounter (Signed)
Pt is returning call. I told her what Dr. Clinton Sawyer wrote but she just wants to make sure it is a talking meter. JW

## 2013-04-12 NOTE — Telephone Encounter (Signed)
Called patient to let her know about a rejection of glucometer request by insurance. I will send Rx for another if she does not want to pay for it.

## 2013-04-13 ENCOUNTER — Telehealth: Payer: Self-pay | Admitting: Family Medicine

## 2013-04-13 DIAGNOSIS — I82623 Acute embolism and thrombosis of deep veins of upper extremity, bilateral: Secondary | ICD-10-CM

## 2013-04-13 MED ORDER — ENOXAPARIN SODIUM 80 MG/0.8ML ~~LOC~~ SOLN: 70.0000 mg | SUBCUTANEOUS | Status: DC

## 2013-04-13 NOTE — Discharge Summary (Signed)
I have reviewed this discharge summary and agree.    

## 2013-04-13 NOTE — Telephone Encounter (Signed)
Pt is calling to let Dr. Jarvis Newcomer know that the lab ran her  Kidney test and her  INR  Was 2.8 . Pt would like to know if she should still take her blood thinner medication. JW

## 2013-04-13 NOTE — Telephone Encounter (Signed)
I spoke with [name] and stated that I received a message the her insurance will not cover Prodigy voice glucometer. However, I do not know which one is covered, so she was advised to call her insurance company to ask which one will be approved.   Additionally, the patient notes that she went to [facility] on Monday, but she did not receive a prescription for more coumadin. I told her that I will call there to figure this out.

## 2013-04-13 NOTE — Telephone Encounter (Signed)
I spoke with the patient and decided that until she comes for her appointment with Dr. Jarvis Newcomer next week, we will use daily Lovenox 70 mg for anticoagulation. Then at that visit, she will have her INR check and coumadin started. She is agreeable to this plan.   Additionally, she will purchase the talking meter from [facility] regardless of insurance benefit.

## 2013-04-13 NOTE — Telephone Encounter (Signed)
I called [name] and was instructed that th nephrologists there would not manage coumadin. This needs to be done by a primary physician or cardiologist. I will call the patient and instruct of this.

## 2013-04-14 MED ORDER — WARFARIN SODIUM 2.5 MG PO TABS: 2.5000 mg | ORAL_TABLET | Freq: Every day | ORAL | Status: DC

## 2013-04-14 NOTE — Telephone Encounter (Signed)
Discussed with Dr. Clinton Sawyer, who will contact patient. Hazeline Junker, MD

## 2013-04-14 NOTE — Telephone Encounter (Signed)
I spoke with the patient who confirmed that her INR was 2.8 when checked yesterday at dialysis. She last took coumadin 2.5 mg two days ago. For two days prior to she took 7.5 mg. Given that her INR is therapeutic, I told her that it is okay to stop lovenox. She should continue Coumadin 2.5 mg daily until she sees Dr. Jarvis Newcomer. Additionally, she was instructed to have her INR checked again next Wednesday on dialysis. She was told to call with any complications or concerns.   Rx for Coumadin 2.5 mg PO daily  # 60 was sent to [facility].

## 2013-04-21 ENCOUNTER — Ambulatory Visit (INDEPENDENT_AMBULATORY_CARE_PROVIDER_SITE_OTHER): Payer: BC Managed Care – PPO | Admitting: *Deleted

## 2013-04-21 ENCOUNTER — Ambulatory Visit (INDEPENDENT_AMBULATORY_CARE_PROVIDER_SITE_OTHER): Payer: Medicare Other | Admitting: Family Medicine

## 2013-04-21 ENCOUNTER — Encounter: Payer: Self-pay | Admitting: Family Medicine

## 2013-04-21 VITALS — BP 126/77 | HR 89 | Temp 99.1°F | Ht 62.5 in | Wt 145.4 lb

## 2013-04-21 DIAGNOSIS — I82402 Acute embolism and thrombosis of unspecified deep veins of left lower extremity: Secondary | ICD-10-CM

## 2013-04-21 DIAGNOSIS — N186 End stage renal disease: Secondary | ICD-10-CM

## 2013-04-21 DIAGNOSIS — I82409 Acute embolism and thrombosis of unspecified deep veins of unspecified lower extremity: Secondary | ICD-10-CM

## 2013-04-21 DIAGNOSIS — E11319 Type 2 diabetes mellitus with unspecified diabetic retinopathy without macular edema: Secondary | ICD-10-CM | POA: Insufficient documentation

## 2013-04-21 DIAGNOSIS — E039 Hypothyroidism, unspecified: Secondary | ICD-10-CM

## 2013-04-21 DIAGNOSIS — I10 Essential (primary) hypertension: Secondary | ICD-10-CM

## 2013-04-21 DIAGNOSIS — I82622 Acute embolism and thrombosis of deep veins of left upper extremity: Secondary | ICD-10-CM

## 2013-04-21 DIAGNOSIS — I82629 Acute embolism and thrombosis of deep veins of unspecified upper extremity: Secondary | ICD-10-CM

## 2013-04-21 DIAGNOSIS — E785 Hyperlipidemia, unspecified: Secondary | ICD-10-CM

## 2013-04-21 LAB — POCT INR: INR: 1

## 2013-04-21 MED ORDER — ATORVASTATIN CALCIUM 40 MG PO TABS: 40.0000 mg | ORAL_TABLET | Freq: Every day | ORAL | Status: DC

## 2013-04-21 MED ORDER — LEVOTHYROXINE SODIUM 75 MCG PO TABS: 75.0000 ug | ORAL_TABLET | Freq: Every day | ORAL | Status: DC

## 2013-04-21 NOTE — Patient Instructions (Signed)
It was good to speak with you again

## 2013-04-21 NOTE — Progress Notes (Signed)
Subjective:     Patient ID: [name], female   DOB: Feb 21, 1981, 32 y.o.   MRN: [id number]  HPI [name] is a 32 yo female presenting for 2 week hospital follow-up after being admitted for acute renal failure.    ESRD: She received a temporary dialysis catheter and began dialysis during the admission, as well as having an AV graft placed in the R femoral for permanent access. She will follow-up with the vascular surgeon in 2 weeks.    DVT of left upper extremity: She was found to have a DVT on vein mapping, excluding this site from being a possible AVF site.  Was bridged on lovenox to coumadin. Last INR was 2.8 on 04/13/13.  INR today is 1 after reportedly taking 2.5mg  coumadin everyday since 7/9.  Type 1 diabetes mellitus:  Has had poor compliance in the past, but states she is very motivated to have tight glucose control.  Takes Lantus 11 units in AM, 12 units in PM.  Also takes meal-time humalog sliding scale.  She has not had an audio-reporting glucometer until today, so she has no information on her previous BG since admission.    Diabetic retinopathy, proliferative: Only has light/shadow sensation in her right, upper visual field. Sees ophthalmologist who says there is no treatment to improve her lost vision, and glycemic control is only prevention for further loss.   Hypothyroidism: S/p I-131 ablation for Graves disease in 2011, never on synthroid.  TSH was 265 on 04/02/13 with low free T3 and free T4.  Was placed on 50 mcg synthroid at that time.  C/o chronic constipation. Denies palpitations, insomnia, diarrhea, heat/cold intolerance.    Hypertension: Takes amlodipine 10mg  without issues. Echo on 04/06/13 showed mild LVH and normal systolic function.    Hyperlipidemia: Has not been taking any medication for this. She does not know her previous LDL.      Review of Systems  Constitutional: Negative for fever and chills.  HENT: Negative for sore throat and rhinorrhea.   Eyes:  Negative for pain and redness.  Respiratory: Negative for chest tightness and shortness of breath.   Cardiovascular: Negative for chest pain, palpitations and leg swelling.  Gastrointestinal: Negative for diarrhea and constipation.  Endocrine: Negative for cold intolerance and heat intolerance.  Neurological: Negative for dizziness and syncope.  Hematological: Does not bruise/bleed easily.       Objective:   Physical Exam  Constitutional: She is oriented to person, place, and time. She appears well-developed and well-nourished.  HENT:  Head: Normocephalic and atraumatic.  Mouth/Throat: Oropharynx is clear and moist.  Eyes:  Prosthetic L eye, R eye with muddy sclera, reactive pupil.  Retinal exam with exudates.   Neck: Normal range of motion. Neck supple.  Cardiovascular: Normal rate, regular rhythm, normal heart sounds and intact distal pulses.   Pulmonary/Chest: Effort normal and breath sounds normal.  Abdominal: Soft. There is no tenderness.  Musculoskeletal: Normal range of motion. She exhibits no edema.  Neurological: She is alert and oriented to person, place, and time.  Skin: Skin is warm and dry.  Psychiatric: She has a normal mood and affect. Her behavior is normal.  Nearly no vision in visual fields.      Assessment:     End-stage renal disease: likely secondary to long-standing diabetes and hypertension.  Insulin-dependent diabetes mellitus with diabetic retinopathy.  HTN, controlled,  HLP, unknown control Hypothyroidism s/p ablation for Graves disease    Plan:     ESRD: Continue  dialysis MWF at [facility].    DVT of left upper extremity: INR today: 1.  Will restart lovenox today 80mg  to re-bridge, start coumadin 7.5 x1, then 5.0 until Monday when she comes in for re-check and modification.     Type 1 diabetes mellitus: Will refer to diabetes management with Dr. Raymondo Band, with follow-up in 2 weeks with blood sugar readings.  She is encouraged to  continue healthy lifestyle habits including walking/exercise and diet high in vegetables and low in carbohydrates.   Hypothyroidism: Given no symptoms at this time, will increase synthroid to daily for 1 week followed by another increase to 100 mcg after a week.  Will recheck TSH 6 weeks after stable dose is reached.    Hypertension: Takes amlodipine 10mg  without issues, will continue, given good control today.   Hyperlipidemia: No previous LDL to stratify intensity of treatment, but likely would benefit from statin therapy.  Will start atorvastatin 40mg  and will check lipid panel at time of TSH, as thyroid modifications will effect cholesterol.

## 2013-04-25 ENCOUNTER — Ambulatory Visit (INDEPENDENT_AMBULATORY_CARE_PROVIDER_SITE_OTHER): Payer: BC Managed Care – PPO | Admitting: *Deleted

## 2013-04-25 DIAGNOSIS — I82629 Acute embolism and thrombosis of deep veins of unspecified upper extremity: Secondary | ICD-10-CM

## 2013-04-25 DIAGNOSIS — I82622 Acute embolism and thrombosis of deep veins of left upper extremity: Secondary | ICD-10-CM

## 2013-04-27 ENCOUNTER — Ambulatory Visit: Payer: BC Managed Care – PPO | Admitting: Vascular Surgery

## 2013-04-29 ENCOUNTER — Ambulatory Visit (INDEPENDENT_AMBULATORY_CARE_PROVIDER_SITE_OTHER): Payer: BC Managed Care – PPO | Admitting: *Deleted

## 2013-04-29 DIAGNOSIS — I82622 Acute embolism and thrombosis of deep veins of left upper extremity: Secondary | ICD-10-CM

## 2013-04-29 DIAGNOSIS — I82629 Acute embolism and thrombosis of deep veins of unspecified upper extremity: Secondary | ICD-10-CM

## 2013-04-29 MED ORDER — ENOXAPARIN SODIUM 80 MG/0.8ML ~~LOC~~ SOLN: 80.0000 mg | Freq: Every day | SUBCUTANEOUS | Status: DC

## 2013-05-02 ENCOUNTER — Ambulatory Visit (INDEPENDENT_AMBULATORY_CARE_PROVIDER_SITE_OTHER): Payer: BC Managed Care – PPO | Admitting: *Deleted

## 2013-05-02 DIAGNOSIS — I82622 Acute embolism and thrombosis of deep veins of left upper extremity: Secondary | ICD-10-CM

## 2013-05-02 DIAGNOSIS — I82629 Acute embolism and thrombosis of deep veins of unspecified upper extremity: Secondary | ICD-10-CM

## 2013-05-03 ENCOUNTER — Telehealth: Payer: Self-pay | Admitting: Family Medicine

## 2013-05-03 NOTE — Telephone Encounter (Signed)
The patient and her partner are both spoke with CMA Alvino Chapel. They seemed quite upset. I then to call. She was also quite upset on the phone with me.   She was using this speakerphone so she was speaking quite loudly, it sounded like she was yelling. When I asked her to speak softly, she told me  To stop  talking to her like she was a child. She used occasional profanity.  I tried to reason with her but ultimately could not get the situation under good control. She continued to speak an agitated manner at high Valium. I had difficulty getting up wording H. Judeth Porch so I tried to elicit until she was done and when I could slip a word in I informed her that I would get the message to Dr. Jarvis Newcomer and that we would call her back within the next 24 hours. I don't know what the situation is with the FMLA. She evidently does have an appointment this Friday. Her partner did make a veiled threat  to me in that she said: " if I have to, I'll come down there with her to her appointment, we'll get the #@%$^  paperwork and then we'll just go to another doctor if we have to".  I'll get in contact with Dr.Grunz who is on nights this week. I will get the information and have one of [place] call her tomorrow before 5.    Denny Levy

## 2013-05-03 NOTE — Telephone Encounter (Signed)
The patients partner is calling upset that the [facility] paperwork that they gave to Dr. Jarvis Newcomer weeks ago is not complete yet.  She has seen Dr. Jarvis Newcomer one time on 7/17 which was 2 weeks ago.  The call was transferred to [facility].

## 2013-05-04 ENCOUNTER — Ambulatory Visit: Payer: BC Managed Care – PPO | Admitting: Vascular Surgery

## 2013-05-04 ENCOUNTER — Other Ambulatory Visit: Payer: Self-pay | Admitting: Family Medicine

## 2013-05-04 DIAGNOSIS — Z0000 Encounter for general adult medical examination without abnormal findings: Secondary | ICD-10-CM

## 2013-05-04 DIAGNOSIS — E785 Hyperlipidemia, unspecified: Secondary | ICD-10-CM

## 2013-05-04 DIAGNOSIS — N186 End stage renal disease: Secondary | ICD-10-CM

## 2013-05-04 NOTE — Telephone Encounter (Signed)
Dr. Jarvis Newcomer spoke with patient this AM ---please see his telephone note for details.[name]

## 2013-05-06 ENCOUNTER — Ambulatory Visit (INDEPENDENT_AMBULATORY_CARE_PROVIDER_SITE_OTHER): Payer: BC Managed Care – PPO | Admitting: *Deleted

## 2013-05-06 DIAGNOSIS — I82629 Acute embolism and thrombosis of deep veins of unspecified upper extremity: Secondary | ICD-10-CM

## 2013-05-06 DIAGNOSIS — I82622 Acute embolism and thrombosis of deep veins of left upper extremity: Secondary | ICD-10-CM

## 2013-05-08 ENCOUNTER — Telehealth: Payer: Self-pay | Admitting: Family Medicine

## 2013-05-08 NOTE — Telephone Encounter (Signed)
Called and spoke with Ms. Gelb on 7/30 at 9:30AM about my difficulties filling out the 3 different documents she requires, as there was no communication relating the reason for these forms.  I apologized for not having communicated more promptly this difficulty, and told her they are complete and ready for her to pick up on Fri 8/1 at coumadin clinic.  She was thankful and expressed understanding.     Ether Wolters B. Jarvis Newcomer, MD PGY-1, [facility] 05/08/2013 3:37 PM

## 2013-05-10 ENCOUNTER — Other Ambulatory Visit: Payer: Self-pay | Admitting: Family Medicine

## 2013-05-10 ENCOUNTER — Ambulatory Visit (INDEPENDENT_AMBULATORY_CARE_PROVIDER_SITE_OTHER): Payer: BC Managed Care – PPO | Admitting: *Deleted

## 2013-05-10 DIAGNOSIS — I82622 Acute embolism and thrombosis of deep veins of left upper extremity: Secondary | ICD-10-CM

## 2013-05-10 DIAGNOSIS — I82623 Acute embolism and thrombosis of deep veins of upper extremity, bilateral: Secondary | ICD-10-CM

## 2013-05-10 DIAGNOSIS — I82629 Acute embolism and thrombosis of deep veins of unspecified upper extremity: Secondary | ICD-10-CM

## 2013-05-10 LAB — POCT INR: INR: 4

## 2013-05-10 MED ORDER — WARFARIN SODIUM 2.5 MG PO TABS: 2.5000 mg | ORAL_TABLET | Freq: Every day | ORAL | Status: DC

## 2013-05-12 ENCOUNTER — Other Ambulatory Visit (HOSPITAL_COMMUNITY): Payer: Self-pay | Admitting: Nephrology

## 2013-05-12 DIAGNOSIS — N186 End stage renal disease: Secondary | ICD-10-CM

## 2013-05-13 ENCOUNTER — Ambulatory Visit (INDEPENDENT_AMBULATORY_CARE_PROVIDER_SITE_OTHER): Payer: BC Managed Care – PPO | Admitting: *Deleted

## 2013-05-13 DIAGNOSIS — I82622 Acute embolism and thrombosis of deep veins of left upper extremity: Secondary | ICD-10-CM

## 2013-05-13 DIAGNOSIS — I82629 Acute embolism and thrombosis of deep veins of unspecified upper extremity: Secondary | ICD-10-CM

## 2013-05-13 LAB — POCT INR: INR: 2

## 2013-05-16 ENCOUNTER — Other Ambulatory Visit: Payer: Self-pay | Admitting: Family Medicine

## 2013-05-16 DIAGNOSIS — I82623 Acute embolism and thrombosis of deep veins of upper extremity, bilateral: Secondary | ICD-10-CM

## 2013-05-16 MED ORDER — WARFARIN SODIUM 2.5 MG PO TABS: 2.5000 mg | ORAL_TABLET | Freq: Every day | ORAL | Status: DC

## 2013-05-16 NOTE — Progress Notes (Signed)
Patient in goal INR range.   Dose adjusted and communicated change via CMA.    Follow up in 1 week.

## 2013-05-17 ENCOUNTER — Encounter: Payer: Self-pay | Admitting: Vascular Surgery

## 2013-05-17 ENCOUNTER — Ambulatory Visit (HOSPITAL_COMMUNITY): Admission: RE | Admit: 2013-05-17 | Payer: BC Managed Care – PPO | Source: Ambulatory Visit

## 2013-05-18 ENCOUNTER — Ambulatory Visit: Payer: BC Managed Care – PPO | Admitting: Vascular Surgery

## 2013-05-20 ENCOUNTER — Ambulatory Visit (INDEPENDENT_AMBULATORY_CARE_PROVIDER_SITE_OTHER): Payer: BC Managed Care – PPO | Admitting: *Deleted

## 2013-05-20 DIAGNOSIS — I82622 Acute embolism and thrombosis of deep veins of left upper extremity: Secondary | ICD-10-CM

## 2013-05-20 DIAGNOSIS — I82629 Acute embolism and thrombosis of deep veins of unspecified upper extremity: Secondary | ICD-10-CM

## 2013-05-20 LAB — POCT INR: INR: 1.7

## 2013-05-22 ENCOUNTER — Encounter (HOSPITAL_COMMUNITY): Payer: Self-pay | Admitting: Emergency Medicine

## 2013-05-22 ENCOUNTER — Emergency Department (HOSPITAL_COMMUNITY)
Admission: EM | Admit: 2013-05-22 | Discharge: 2013-05-22 | Discharge status: Against Medical Advice | Payer: 59 | Attending: Emergency Medicine | Admitting: Emergency Medicine

## 2013-05-22 DIAGNOSIS — H543 Unqualified visual loss, both eyes: Secondary | ICD-10-CM | POA: Insufficient documentation

## 2013-05-22 DIAGNOSIS — R0602 Shortness of breath: Secondary | ICD-10-CM | POA: Insufficient documentation

## 2013-05-22 DIAGNOSIS — E109 Type 1 diabetes mellitus without complications: Secondary | ICD-10-CM | POA: Insufficient documentation

## 2013-05-22 DIAGNOSIS — N186 End stage renal disease: Secondary | ICD-10-CM | POA: Insufficient documentation

## 2013-05-22 DIAGNOSIS — Z992 Dependence on renal dialysis: Secondary | ICD-10-CM | POA: Insufficient documentation

## 2013-05-22 DIAGNOSIS — R110 Nausea: Secondary | ICD-10-CM | POA: Insufficient documentation

## 2013-05-22 DIAGNOSIS — R509 Fever, unspecified: Secondary | ICD-10-CM | POA: Insufficient documentation

## 2013-05-22 DIAGNOSIS — I120 Hypertensive chronic kidney disease with stage 5 chronic kidney disease or end stage renal disease: Secondary | ICD-10-CM | POA: Insufficient documentation

## 2013-05-22 HISTORY — DX: Essential (primary) hypertension: I10

## 2013-05-22 NOTE — ED Notes (Signed)
PT. REPORTS CHILLS , LOW GRADE FEVER , NAUSEA AND SLIGHT SOB ONSET 2 DAYS AGO , HEMODIALYSIS MON/ WED/FRI.

## 2013-05-23 ENCOUNTER — Inpatient Hospital Stay (HOSPITAL_COMMUNITY)
Admission: EM | Admit: 2013-05-23 | Discharge: 2013-05-25 | DRG: 314 | Disposition: A | Discharge status: Home / Self Care | Payer: 59 | Attending: Family Medicine | Admitting: Family Medicine

## 2013-05-23 ENCOUNTER — Encounter (HOSPITAL_COMMUNITY): Payer: Self-pay | Admitting: General Practice

## 2013-05-23 DIAGNOSIS — E109 Type 1 diabetes mellitus without complications: Secondary | ICD-10-CM

## 2013-05-23 DIAGNOSIS — I10 Essential (primary) hypertension: Secondary | ICD-10-CM

## 2013-05-23 DIAGNOSIS — E785 Hyperlipidemia, unspecified: Secondary | ICD-10-CM | POA: Diagnosis present

## 2013-05-23 DIAGNOSIS — E1039 Type 1 diabetes mellitus with other diabetic ophthalmic complication: Secondary | ICD-10-CM | POA: Diagnosis present

## 2013-05-23 DIAGNOSIS — E039 Hypothyroidism, unspecified: Secondary | ICD-10-CM

## 2013-05-23 DIAGNOSIS — N186 End stage renal disease: Secondary | ICD-10-CM

## 2013-05-23 DIAGNOSIS — Z95828 Presence of other vascular implants and grafts: Secondary | ICD-10-CM

## 2013-05-23 DIAGNOSIS — R509 Fever, unspecified: Secondary | ICD-10-CM

## 2013-05-23 DIAGNOSIS — I120 Hypertensive chronic kidney disease with stage 5 chronic kidney disease or end stage renal disease: Secondary | ICD-10-CM | POA: Diagnosis present

## 2013-05-23 DIAGNOSIS — H543 Unqualified visual loss, both eyes: Secondary | ICD-10-CM | POA: Diagnosis present

## 2013-05-23 DIAGNOSIS — I82629 Acute embolism and thrombosis of deep veins of unspecified upper extremity: Secondary | ICD-10-CM

## 2013-05-23 DIAGNOSIS — E1029 Type 1 diabetes mellitus with other diabetic kidney complication: Secondary | ICD-10-CM | POA: Diagnosis present

## 2013-05-23 DIAGNOSIS — Z86718 Personal history of other venous thrombosis and embolism: Secondary | ICD-10-CM

## 2013-05-23 DIAGNOSIS — H547 Unspecified visual loss: Secondary | ICD-10-CM

## 2013-05-23 DIAGNOSIS — Z7901 Long term (current) use of anticoagulants: Secondary | ICD-10-CM

## 2013-05-23 DIAGNOSIS — I82509 Chronic embolism and thrombosis of unspecified deep veins of unspecified lower extremity: Secondary | ICD-10-CM | POA: Diagnosis present

## 2013-05-23 DIAGNOSIS — Y849 Medical procedure, unspecified as the cause of abnormal reaction of the patient, or of later complication, without mention of misadventure at the time of the procedure: Secondary | ICD-10-CM | POA: Diagnosis present

## 2013-05-23 DIAGNOSIS — Z794 Long term (current) use of insulin: Secondary | ICD-10-CM

## 2013-05-23 DIAGNOSIS — Z48812 Encounter for surgical aftercare following surgery on the circulatory system: Secondary | ICD-10-CM

## 2013-05-23 DIAGNOSIS — T827XXA Infection and inflammatory reaction due to other cardiac and vascular devices, implants and grafts, initial encounter: Principal | ICD-10-CM | POA: Diagnosis present

## 2013-05-23 DIAGNOSIS — E11319 Type 2 diabetes mellitus with unspecified diabetic retinopathy without macular edema: Secondary | ICD-10-CM

## 2013-05-23 DIAGNOSIS — N2581 Secondary hyperparathyroidism of renal origin: Secondary | ICD-10-CM | POA: Diagnosis present

## 2013-05-23 HISTORY — DX: Type 1 diabetes mellitus with unspecified diabetic retinopathy without macular edema: E10.319

## 2013-05-23 HISTORY — DX: Thyrotoxicosis with diffuse goiter without thyrotoxic crisis or storm: E05.00

## 2013-05-23 HISTORY — DX: Pure hypercholesterolemia, unspecified: E78.00

## 2013-05-23 HISTORY — DX: End stage renal disease: N18.6

## 2013-05-23 HISTORY — DX: Cardiac murmur, unspecified: R01.1

## 2013-05-23 HISTORY — DX: Acute embolism and thrombosis of unspecified deep veins of unspecified lower extremity: I82.409

## 2013-05-23 HISTORY — DX: End stage renal disease: Z99.2

## 2013-05-23 HISTORY — DX: Type 1 diabetes mellitus without complications: E10.9

## 2013-05-23 LAB — URINALYSIS, ROUTINE W REFLEX MICROSCOPIC
Bilirubin Urine: NEGATIVE
Nitrite: NEGATIVE
Specific Gravity, Urine: 1.021 (ref 1.005–1.030)
pH: 7.5 (ref 5.0–8.0)

## 2013-05-23 LAB — BASIC METABOLIC PANEL
BUN: 21 mg/dL (ref 6–23)
CO2: 26 mEq/L (ref 19–32)
Chloride: 94 mEq/L — ABNORMAL LOW (ref 96–112)
GFR calc Af Amer: 15 mL/min — ABNORMAL LOW (ref >90)
Glucose, Bld: 298 mg/dL — ABNORMAL HIGH (ref 70–99)
Potassium: 3.5 mEq/L (ref 3.5–5.1)

## 2013-05-23 LAB — CBC WITH DIFFERENTIAL/PLATELET
Basophils Relative: 0 % (ref 0–1)
Hemoglobin: 10 g/dL — ABNORMAL LOW (ref 12.0–15.0)
Lymphocytes Relative: 5 % — ABNORMAL LOW (ref 12–46)
Lymphs Abs: 0.6 10*3/uL — ABNORMAL LOW (ref 0.7–4.0)
Monocytes Relative: 6 % (ref 3–12)
Neutro Abs: 11.6 10*3/uL — ABNORMAL HIGH (ref 1.7–7.7)
Neutrophils Relative %: 90 % — ABNORMAL HIGH (ref 43–77)
RBC: 3.59 MIL/uL — ABNORMAL LOW (ref 3.87–5.11)

## 2013-05-23 LAB — URINE MICROSCOPIC-ADD ON

## 2013-05-23 LAB — PROTIME-INR: INR: 2.77 — ABNORMAL HIGH (ref 0.00–1.49)

## 2013-05-23 LAB — POCT PREGNANCY, URINE: Preg Test, Ur: NEGATIVE

## 2013-05-23 LAB — CG4 I-STAT (LACTIC ACID): Lactic Acid, Venous: 1.86 mmol/L (ref 0.5–2.2)

## 2013-05-23 MED ORDER — SODIUM CHLORIDE 0.9 % IJ SOLN
3.0000 mL | Freq: Two times a day (BID) | INTRAMUSCULAR | Status: DC
  Administered 2013-05-23 – 2013-05-24 (×2): 3 mL via INTRAVENOUS

## 2013-05-23 MED ORDER — PIPERACILLIN-TAZOBACTAM IN DEX 2-0.25 GM/50ML IV SOLN
2.2500 g | Freq: Three times a day (TID) | INTRAVENOUS | Status: DC
  Administered 2013-05-23: 2.25 g via INTRAVENOUS
  Filled 2013-05-23 (×2): qty 50

## 2013-05-23 MED ORDER — INSULIN ASPART 100 UNIT/ML ~~LOC~~ SOLN
0.0000 [IU] | Freq: Three times a day (TID) | SUBCUTANEOUS | Status: DC
  Administered 2013-05-23: 9 [IU] via SUBCUTANEOUS
  Administered 2013-05-24: 5 [IU] via SUBCUTANEOUS
  Administered 2013-05-24: 9 [IU] via SUBCUTANEOUS
  Administered 2013-05-24: 7 [IU] via SUBCUTANEOUS
  Administered 2013-05-25: 1 [IU] via SUBCUTANEOUS

## 2013-05-23 MED ORDER — WARFARIN SODIUM 7.5 MG PO TABS
7.5000 mg | ORAL_TABLET | Freq: Every day | ORAL | Status: DC
  Administered 2013-05-23: 7.5 mg via ORAL
  Filled 2013-05-23 (×2): qty 1

## 2013-05-23 MED ORDER — LEVOTHYROXINE SODIUM 75 MCG PO TABS
75.0000 ug | ORAL_TABLET | Freq: Every day | ORAL | Status: DC
  Administered 2013-05-24 – 2013-05-25 (×2): 75 ug via ORAL
  Filled 2013-05-23 (×3): qty 1

## 2013-05-23 MED ORDER — ATORVASTATIN CALCIUM 40 MG PO TABS
40.0000 mg | ORAL_TABLET | Freq: Every day | ORAL | Status: DC
  Administered 2013-05-23 – 2013-05-24 (×2): 40 mg via ORAL
  Filled 2013-05-23 (×3): qty 1

## 2013-05-23 MED ORDER — HYDROMORPHONE HCL PF 1 MG/ML IJ SOLN: 1.0000 mg | INTRAMUSCULAR | Status: DC | PRN

## 2013-05-23 MED ORDER — INSULIN ASPART 100 UNIT/ML ~~LOC~~ SOLN: 0.0000 [IU] | Freq: Three times a day (TID) | SUBCUTANEOUS | Status: DC

## 2013-05-23 MED ORDER — WARFARIN - PHARMACIST DOSING INPATIENT: Freq: Every day | Status: DC

## 2013-05-23 MED ORDER — ACETAMINOPHEN 325 MG PO TABS
650.0000 mg | ORAL_TABLET | Freq: Once | ORAL | Status: AC
  Administered 2013-05-23: 650 mg via ORAL
  Filled 2013-05-23: qty 2
  Filled 2013-05-23: qty 1

## 2013-05-23 MED ORDER — ACETAMINOPHEN 325 MG PO TABS: 650.0000 mg | ORAL_TABLET | Freq: Four times a day (QID) | ORAL | Status: DC | PRN

## 2013-05-23 MED ORDER — INSULIN GLARGINE 100 UNIT/ML ~~LOC~~ SOLN
12.0000 [IU] | Freq: Two times a day (BID) | SUBCUTANEOUS | Status: DC
  Administered 2013-05-23 – 2013-05-25 (×4): 12 [IU] via SUBCUTANEOUS
  Filled 2013-05-23 (×5): qty 0.12

## 2013-05-23 MED ORDER — IBUPROFEN 800 MG PO TABS
800.0000 mg | ORAL_TABLET | Freq: Once | ORAL | Status: DC
  Filled 2013-05-23: qty 1

## 2013-05-23 MED ORDER — HEPARIN SODIUM (PORCINE) 5000 UNIT/ML IJ SOLN
5000.0000 [IU] | Freq: Three times a day (TID) | INTRAMUSCULAR | Status: DC
  Filled 2013-05-23 (×2): qty 1

## 2013-05-23 MED ORDER — PIPERACILLIN-TAZOBACTAM 3.375 G IVPB 30 MIN: 3.3750 g | Freq: Once | INTRAVENOUS | Status: DC

## 2013-05-23 MED ORDER — ONDANSETRON HCL 4 MG/2ML IJ SOLN: 4.0000 mg | Freq: Three times a day (TID) | INTRAMUSCULAR | Status: DC | PRN

## 2013-05-23 MED ORDER — VANCOMYCIN HCL IN DEXTROSE 750-5 MG/150ML-% IV SOLN: 750.0000 mg | INTRAVENOUS | Status: DC

## 2013-05-23 MED ORDER — ACETAMINOPHEN 650 MG RE SUPP: 650.0000 mg | Freq: Four times a day (QID) | RECTAL | Status: DC | PRN

## 2013-05-23 MED ORDER — LANTHANUM CARBONATE 500 MG PO CHEW
1000.0000 mg | CHEWABLE_TABLET | Freq: Three times a day (TID) | ORAL | Status: DC
  Administered 2013-05-24 – 2013-05-25 (×5): 1000 mg via ORAL
  Filled 2013-05-23 (×7): qty 2

## 2013-05-23 NOTE — ED Notes (Signed)
Patient transported to Vascular Lab. ?

## 2013-05-23 NOTE — Progress Notes (Signed)
ANTIBIOTIC CONSULT NOTE - INITIAL  Pharmacy Consult for vancomycin + zosyn Indication: empiric for fever  No Known Allergies  Patient Measurements:   Adjusted Body Weight:   Vital Signs: Temp: 100.9 F (38.3 C) (08/18 1414) Temp src: Oral (08/18 1414) BP: 112/52 mmHg (08/18 1414) Pulse Rate: 101 (08/18 1414) Intake/Output from previous day:   Intake/Output from this shift:    Labs:  Recent Labs  05/23/13 1231  WBC 12.9*  HGB 10.0*  PLT 186  CREATININE 4.33*   The CrCl is unknown because both a height and weight (above a minimum accepted value) are required for this calculation. No results found for this basename: VANCOTROUGH, VANCOPEAK, VANCORANDOM, GENTTROUGH, GENTPEAK, GENTRANDOM, TOBRATROUGH, TOBRAPEAK, TOBRARND, AMIKACINPEAK, AMIKACINTROU, AMIKACIN,  in the last 72 hours   Microbiology: No results found for this or any previous visit (from the past 720 hour(s)).  Medical History: Past Medical History  Diagnosis Date  . Diabetes mellitus without complication   . Blind   . Hypothyroidism   . ESRD (end stage renal disease)   . Hypertension   . Hemodialysis patient     Medications:  Anti-infectives   Start     Dose/Rate Route Frequency Ordered Stop   05/25/13 1200  vancomycin (VANCOCIN) IVPB 750 mg/150 ml premix     750 mg 150 mL/hr over 60 Minutes Intravenous Every M-W-F (Hemodialysis) 05/23/13 1424     05/23/13 2200  piperacillin-tazobactam (ZOSYN) IVPB 2.25 g     2.25 g 100 mL/hr over 30 Minutes Intravenous 3 times per day 05/23/13 1424     05/23/13 1430  piperacillin-tazobactam (ZOSYN) IVPB 3.375 g     3.375 g 100 mL/hr over 30 Minutes Intravenous  Once 05/23/13 1424       [name] presented to the ED with fever. She completed 3/4 of dialysis session today and was loaded with 1500mg  vancomycin prior to coming to the ED. Currently, Tmax is 103.2 and WBC is 12.9.   Vanc 8/18>> Zosyn 8/18>>  Goal of  Therapy:  Pre-HD vanc level = 15-25  Plan:  1. Vancomycin 750mg  IV post-HD on MWF 2. Zosyn 3.375gm IV x 1 then 2.25gm Q8H 3. F/u renal plans, C&S, clinical status and pre-HD level when appropriate  [name], [name] 05/23/2013,2:25 PM

## 2013-05-23 NOTE — ED Notes (Addendum)
Was here on Sunday for same fever felt better so she left went to dialysis this am and found she she fever she did take tylenol today while there. Pt is blind dialysis thinks her graft may be infected in rt thigh and was given antiobiotic in dialysis today. Only had 3/4  tx today and they do blood culture she sates

## 2013-05-23 NOTE — H&P (Signed)
Family Medicine Teaching [facility] Admission History and Physical Service Pager: [phone]  Patient name: [name] Medical record number: [id number] Date of birth: [birth year] Age: 32 y.o. Gender: female  Primary Care Provider: Hazeline Junker, MD Consultants: NEPHROLOGY Code Status: FULL  Chief Complaint: fever and recent pain associated with her AVG  Assessment and Plan: [name] is a 32 y.o. year old female presenting with fever. PMH is significant for blindness and ESRD 2/2 DM type 1, HTN, hyperlipidemia, hypothyroidism and history of DVT.  #FEVER - patient w/ 3-day history of fever and pain associated with her AVG during dialysis.  Prior to admission, pt last had HD Friday, which was when she began to feel sick.  She felt worse throughout the weekend and had new pain at her graft site at HD Monday.  Concerned for infected AVG vs. Bacteremia.   - renal consulted, appreciate rec's   - WBC 12.9   [ ]  f/u blood and urine cultures   [ ]  f/u final read of ultrasound.    *Site does not appear to have obvious abscess or fluctuant area and not erythematous. Hopeful local infection that will calm antibiotics.   *no further imagining planned currently but appreciate renal and attending input. Would also consider surgical consult if needed.    - starting prophylactic vanc/zosyn until culture results are back  #DIABETES Type I- patient recently not checking glucose regularly at home, moderately well-controlled   - A1C on 6/28 was 8.4   - continue home lantus. Add sensitive SSI but with infection may need additional coverage  # History DVT   -continue home coumadin   #HYPERTENSION   - hold home amlodipine for now with last BP 105/50 but may be able to restart in coming days  #HYPOTHYROIDISM   - continue home synthroid  #HYPERLIPIDEMIA   - continue home lipitor  FEN/GI: renal diet, saline lock Prophylaxis: continue home warfarin  Disposition: admitted to surg/medicine  floor with attending Dr. Larwance Sachs.  History of Present Illness: [name] is a 32 y.o. year old female presenting with fever and concern for infected AVG vs. bacteremia.   Felt a small lump in her right groin at site of AVG last Friday after HD which progressed to chills/fever on Saturday. Those continued over the weekend while the lump became smaller.  Came to the ED yesterday with fever but over time her fever broke and she left due to feeling better but had not yet been evaluated by RN or MD yet. She denies groin pain or erythema around her graft at that time.  She went to dialysis today at [facility] in [place] and was noted to have a low grade fever to 100.4. She was initially stuck once but that clotted off.  Upon accessing the graft a second time, she noted moderate-severe pain. Her fever was also noted to increase during dialysis to 103.3. The pain resolved once they stopped the access and she was sent to the ED.   Otherwise, she has felt well recently and denies cough, congestion, shortness of breath, chest pain, diarrhea, dysuria. Patient does continue to make small amounts of urine. Does endorse slight fatigue. Blood sugars have been higher since Friday (she can feel it but did not check it and does not check her sugars regularly).  Of note, patient's partner is concerned about the sanitary condition of the dialysis center, which she said smells "like urine and bleach."  Her graft is approximately 1.5  months old.  Review Of Systems: Per HPI  Otherwise 12 point review of systems was performed and was unremarkable.  Patient Active Problem List   Diagnosis Date Noted  . Diabetic retinopathy 04/21/2013  . End stage renal disease 04/21/2013  . Essential hypertension, benign 04/21/2013  . DVT of upper extremity (deep vein thrombosis) 04/02/2013  . Renal failure 04/02/2013  . DM (diabetes mellitus), type 1 04/02/2013  . Blind    Past Medical History: Past  Medical History  Diagnosis Date  . Diabetes mellitus without complication   . Blind   . Hypothyroidism   . ESRD (end stage renal disease)   . Hypertension   . Hemodialysis patient    Past Surgical History: Past Surgical History  Procedure Laterality Date  . Insertion of dialysis catheter N/A 04/01/2013    Procedure: INSERTION OF DIALYSIS CATHETER;  Surgeon: Pryor Ochoa, MD;  Location: [facility] OR;  Service: Vascular;  Laterality: N/A;  . Av fistula placement Right 04/07/2013    Procedure: INSERTION OF ARTERIOVENOUS (AV) GORE-TEX GRAFT THIGH;  Surgeon: Chuck Hint, MD;  Location: [facility] OR;  Service: Vascular;  Laterality: Right;   Social History: lives with Orbie Hurst, her partner. No pets. No smoking, alcohol or other drugs.   Please also refer to relevant sections of EMR.  Family History: Family History  Problem Relation Age of Onset  . Hypertension    . Cancer    . Thyroid disease     Allergies and Medications: No Known Allergies No current facility-administered medications on file prior to encounter.   Current Outpatient Prescriptions on File Prior to Encounter  Medication Sig Dispense Refill  . amLODipine (NORVASC) 10 MG tablet Take 1 tablet (10 mg total) by mouth at bedtime.  90 tablet  3  . atorvastatin (LIPITOR) 40 MG tablet Take 1 tablet (40 mg total) by mouth daily.  90 tablet  3  . glucose blood test strip Use as instructed  100 each  12  . insulin aspart (NOVOLOG) 100 UNIT/ML injection Inject 5-10 Units into the skin See admin instructions. Titration of additional 1 unit per every 50 spike in blood sugar level max of 200      . insulin glargine (LANTUS) 100 UNIT/ML injection Inject 12 Units into the skin 2 (two) times daily.       Marland Kitchen levothyroxine (SYNTHROID, LEVOTHROID) 75 MCG tablet Take 1 tablet (75 mcg total) by mouth daily.  90 tablet  3  coumadin 7.5 mg takes 3 a day.  Phosphate binder.   Objective: BP 112/52  Pulse 101  Temp(Src) 100.9 F (38.3 C)  (Oral)  Resp 16  SpO2 97%  LMP 04/21/2013 Exam: General: alert, cooperative and no distress  HEENT: patient blind, left eye is artificial;  Right pupil round and reactive to light, extra ocular movement intact, neck supple with midline trachea and thyroid without masses, external auditory canal normal. TM normal. Oropharynx clear and normal.  Heart: 3/6 SEM, no rub or gallop, regular rate and rhythm  Lungs: clear to auscultation, no wheezes or rales and unlabored breathing  Abdomen: abdomen is soft without significant tenderness, masses, organomegaly or guarding  Extremities: RLE graft in mid thigh, extremities otherwise normal, atraumatic, no cyanosis or edema, specifically no calf pain, swelling or tenderness.  Skin:no rashes, no ecchymoses  Neurology: normal without focal findings, mental status, speech normal, alert and oriented x3  Labs and Imaging: CBC BMET   Recent Labs Lab 05/23/13 1231  WBC 12.9*  HGB  10.0*  HCT 30.3*  PLT 186    Recent Labs Lab 05/23/13 1231  NA 136  K 3.5  CL 94*  CO2 26  BUN 21  CREATININE 4.33*  GLUCOSE 298*  CALCIUM 8.5     8/18 Right thigh AVG duplex .  Preliminary report: Graft patent. There is perigraft complex fluid collection at the arterial anastomosis and for a few inches along the graft. There is a discreet anechoic area in the groin that does not appear to be a part of the perigraft fluid collection. There is no flow noted within this area.    Ezzard Flax, Med Student 05/23/2013, 4:05 PM FPTS Intern pager: [phone], text pages welcome  Family Medicine Upper Level Addendum:   I have seen and examined the patient independently, discussed with Student Doctor Marquita Palms, fully reviewed the H+P and agree with it's contents with the additions as noted in blue text. My independent exam is below.   S: Fever to 103 at dialysis today after several days of fever/chills/not feeling well. Pan with dialysis now resolved and stopped after site  deaccessed.   O: BP 111/60  Pulse 90  Temp(Src) 100.9 F (38.3 C) (Oral)  Resp 16  SpO2 97%  LMP 04/21/2013 Gen: NAD, resting comfortably in bed HEENT: NCAT, MMM, PERRLA on right, left eye artifical, neck supple. TM and oropharynx without abnormality.  CV: 3/6 SEM at LUSB (baseline per patient). RRR no rub.   Lungs: CTAB  Abd: soft/nontender/nondistended/normal bowel sounds  MSK: moves all extremities, no edema  Skin: warm and dry, no rash  Neuro: CN II-XII intact, sensation and reflexes normal throughout, 5/5 muscle strength in bilateral upper and lower extremities. Normal finger to nose. Normal rapid alternating movements.  Extremities: RLE graft in upper thigh up to groin without any obvious erythema or areas of fluctuance surrounding it. Nontender to palpation. No calf pain or unilateral edema. 2+ pulses. No bulge noted in groin.   A/P:   32 year old female with DM Type I and ESRD on dialysis MWF presenting with febrile illness and concern for infected AVG or bacteremia -vanc/zosyn -f/u blood and urine cultures -f/u fever curve and exam of groin -may need further imaging or surgical consult if does not improve with antibiotics  #DM type 1-lantus and SSI. Left home dose lantus as current infection # History DVT-continue home coumadin # Hypertension-hold amlodipine for now #Hypothyroidism-home synthroid # HLD-home lipitor  Tana Conch, MD, PGY3 05/23/2013 5:29 PM

## 2013-05-23 NOTE — Progress Notes (Signed)
ANTICOAGULATION CONSULT NOTE - Initial Consult  Pharmacy Consult for Warfarin Indication: DVT  No Known Allergies  Patient Measurements:   Heparin Dosing Weight: n/a  Vital Signs: Temp: 99.2 F (37.3 C) (08/18 1813) Temp src: Oral (08/18 1813) BP: 114/58 mmHg (08/18 1813) Pulse Rate: 89 (08/18 1813)  Labs:  Recent Labs  05/23/13 1231  HGB 10.0*  HCT 30.3*  PLT 186  LABPROT 28.3*  INR 2.77*  CREATININE 4.33*    The CrCl is unknown because both a height and weight (above a minimum accepted value) are required for this calculation.   Medical History: Past Medical History  Diagnosis Date  . Blind   . Hypothyroidism   . Hypertension   . Type I diabetes mellitus   . DVT (deep venous thrombosis)     "both arms" (05/23/2013)  . End-stage renal disease on hemodialysis     "MWF; [place] [facility]" (*/18/2014)  . High cholesterol   . Heart murmur   . Diabetic retinopathy associated with type 1 diabetes mellitus   . Graves' disease     "had thyroid radioactively treated" (05/23/2013)    Medications:  Scheduled:  . atorvastatin  40 mg Oral q1800  . [START ON 05/24/2013] insulin aspart  0-9 Units Subcutaneous TID WC  . insulin glargine  12 Units Subcutaneous BID  . [START ON 05/24/2013] lanthanum  1,000 mg Oral TID WC  . [START ON 05/24/2013] levothyroxine  75 mcg Oral QAC breakfast  . sodium chloride  3 mL Intravenous Q12H    Assessment: 32 yo female admitted with fever and chills.  On chronic Coumadin 7.5 mg daily.  INR therapeutic today.  Per patient last dose taken at home last night.  No bleeding or complications noted per chart notes.  Goal of Therapy:  INR 2-3 Monitor platelets by anticoagulation protocol: Yes   Plan:  1. Continue Coumadin at home dose of 7.5 mg daily. 2. Continue daily INR for now, but can likely change to MWF INR checks if INR remains stable.  Tad Moore, BCPS  Clinical Pharmacist Pager [phone]  05/23/2013 7:11 PM

## 2013-05-23 NOTE — ED Provider Notes (Signed)
Medical screening examination/treatment/procedure(s) were conducted as a shared visit with non-physician practitioner(s) and myself.  I personally evaluated the patient during the encounter Patient here with a history of dialysis an end-to-side graft to has a fever of unknown origin. Urine is clean and no respiratory complaints. Blood cultures done and patient did get some vancomycin at dialysis today. Also pain at the graft site with dialysis today we'll do an ultrasound to further evaluate graft patency. Patient admitted with broad antibiotic coverage following cultures  Gwyneth Sprout, MD 05/23/13 1939

## 2013-05-23 NOTE — ED Notes (Signed)
Family at bedside. 

## 2013-05-23 NOTE — Progress Notes (Signed)
VASCULAR LAB PRELIMINARY  PRELIMINARY  PRELIMINARY  PRELIMINARY  Right thigh AVG duplex completed.    Preliminary report:  Graft patent.  There is perigraft complex fluid collection at the arterial anastomosis and for a few inches along the graft.  There is a discreet anechoic area in the groin that does not appear to be a part of the perigraft fluid collection.  There is no flow noted within this area.    Karon Cotterill, RVT 05/23/2013, 3:36 PM

## 2013-05-23 NOTE — ED Provider Notes (Signed)
CSN: [id number]     Arrival date & time 05/23/13  1121 History     First MD Initiated Contact with Patient 05/23/13 1201     Chief Complaint  Patient presents with  . Fever   (Consider location/radiation/quality/duration/timing/severity/associated sxs/prior Treatment) Patient is a 32 y.o. female presenting with fever. The history is provided by the patient and medical records.  Fever Associated symptoms: chills    Pt presents to the ED for fever and chills x 3 days.  Pt is on HD, schedule M, W, F, via right thigh graft.  Graft was placed approx 2 months ago by Dr. Hart Rochester-- no noted complications.  Pt notes over the past 3 days her right thigh has appear red in color and feels that her graft pulsation has become weaker than normal.  Pt did go to treatment this morning but was only able to complete 3/4 of her treatment.  Pt states there was pain when they accessed her dialysis graft which is very unusual, but once needle was removed, she felt immediate relief.  Denies any drainage from the graft.  No recent cough, congestion, sweats, abdominal pain, N/V/D.  Does not she has had a decreased appetite but has tried to drink plenty of fluids.  Had some difficulty urinating last night but no dysuria, hematuria, or increased urinary frequency.  Did have blood cultures performed at dialysis this morning and was given partial dose of vancomycin.  Past Medical History  Diagnosis Date  . Diabetes mellitus without complication   . Blind   . Hypothyroidism   . ESRD (end stage renal disease)   . Hypertension   . Hemodialysis patient    Past Surgical History  Procedure Laterality Date  . Insertion of dialysis catheter N/A 04/01/2013    Procedure: INSERTION OF DIALYSIS CATHETER;  Surgeon: Pryor Ochoa, MD;  Location: [facility] OR;  Service: Vascular;  Laterality: N/A;  . Av fistula placement Right 04/07/2013    Procedure: INSERTION OF ARTERIOVENOUS (AV) GORE-TEX GRAFT THIGH;  Surgeon: Chuck Hint,  MD;  Location: [facility] OR;  Service: Vascular;  Laterality: Right;   Family History  Problem Relation Age of Onset  . Hypertension    . Cancer    . Thyroid disease     History  Substance Use Topics  . Smoking status: Never Smoker   . Smokeless tobacco: Not on file  . Alcohol Use: No   OB History   Grav Para Term Preterm Abortions TAB SAB Ect Mult Living                 Review of Systems  Constitutional: Positive for fever and chills.  Skin: Positive for color change.  All other systems reviewed and are negative.    Allergies  Review of patient's allergies indicates no known allergies.  Home Medications   Current Outpatient Rx  Name  Route  Sig  Dispense  Refill  . amLODipine (NORVASC) 10 MG tablet   Oral   Take 1 tablet (10 mg total) by mouth at bedtime.   90 tablet   3   . atorvastatin (LIPITOR) 40 MG tablet   Oral   Take 1 tablet (40 mg total) by mouth daily.   90 tablet   3   . glucose blood test strip      Use as instructed   100 each   12   . insulin aspart (NOVOLOG) 100 UNIT/ML injection   Subcutaneous   Inject 5-10 Units into the skin  See admin instructions. Titration of additional 1 unit per every 50 spike in blood sugar level max of 200         . insulin glargine (LANTUS) 100 UNIT/ML injection   Subcutaneous   Inject 12 Units into the skin 2 (two) times daily.          Marland Kitchen lanthanum (FOSRENOL) 1000 MG chewable tablet   Oral   Chew 1,000 mg by mouth 3 (three) times daily with meals.         Marland Kitchen levothyroxine (SYNTHROID, LEVOTHROID) 75 MCG tablet   Oral   Take 1 tablet (75 mcg total) by mouth daily.   90 tablet   3   . warfarin (COUMADIN) 2.5 MG tablet   Oral   Take 7.5 mg by mouth daily.          BP 145/68  Pulse 128  Temp(Src) 103.2 F (39.6 C) (Oral)  Resp 20  SpO2 98%  LMP 04/21/2013  Physical Exam  Nursing note and vitals reviewed. Constitutional: She is oriented to person, place, and time. She appears well-developed and  well-nourished. She appears ill. No distress.  Appears flushed  HENT:  Head: Normocephalic and atraumatic.  Mouth/Throat: Oropharynx is clear and moist.  Eyes: Conjunctivae and EOM are normal. Pupils are equal, round, and reactive to light.  Neck: Normal range of motion. Neck supple.  Cardiovascular: Normal rate, regular rhythm and normal heart sounds.   Pulmonary/Chest: Effort normal and breath sounds normal. No respiratory distress. She has no wheezes.  Abdominal: Soft. Bowel sounds are normal. There is no tenderness. There is no guarding.  Musculoskeletal: Normal range of motion. She exhibits no edema.  Neurological: She is alert and oriented to person, place, and time.  Skin: Skin is warm and dry.  Right thigh appears mildly erythematous compared with left; healed right groin incision; dialysis graft with strong pulsation  Psychiatric: She has a normal mood and affect.    ED Course   Procedures (including critical care time)  Labs Reviewed  CBC WITH DIFFERENTIAL - Abnormal; Notable for the following:    WBC 12.9 (*)    RBC 3.59 (*)    Hemoglobin 10.0 (*)    HCT 30.3 (*)    Neutrophils Relative % 90 (*)    Neutro Abs 11.6 (*)    Lymphocytes Relative 5 (*)    Lymphs Abs 0.6 (*)    All other components within normal limits  BASIC METABOLIC PANEL - Abnormal; Notable for the following:    Chloride 94 (*)    Glucose, Bld 298 (*)    Creatinine, Ser 4.33 (*)    GFR calc non Af Amer 13 (*)    GFR calc Af Amer 15 (*)    All other components within normal limits  URINALYSIS, ROUTINE W REFLEX MICROSCOPIC - Abnormal; Notable for the following:    Glucose, UA 500 (*)    Hgb urine dipstick MODERATE (*)    Ketones, ur 15 (*)    Protein, ur >300 (*)    All other components within normal limits  PROTIME-INR - Abnormal; Notable for the following:    Prothrombin Time 28.3 (*)    INR 2.77 (*)    All other components within normal limits  URINE MICROSCOPIC-ADD ON - Abnormal; Notable  for the following:    Squamous Epithelial / LPF FEW (*)    Bacteria, UA FEW (*)    All other components within normal limits  CULTURE, BLOOD (ROUTINE X 2)  CULTURE, BLOOD (ROUTINE X 2)  URINE CULTURE  CG4 I-STAT (LACTIC ACID)  POCT PREGNANCY, URINE   No results found.  1. Fever   2. DM (diabetes mellitus), type 1   3. End stage renal disease   4. Essential hypertension, benign   5. Febrile illness, acute   6. Hypothyroidism     MDM   Labs as above.  U/a without definitive infection, culture pending.  Febrile to 103F on arrival, improved to 100F after tylenol.  Started on Vanc and Zosyn.  LE duplex and AVG duplex pending.  Pt will be admitted to family medicine teaching service for possibly bacteremia vs infected dialysis graft.  VS stable.  Garlon Hatchet, PA-C 05/23/13 [phone]

## 2013-05-23 NOTE — ED Notes (Signed)
Patient is resting comfortably. 

## 2013-05-23 NOTE — Progress Notes (Signed)
Admission note:  Arrival Method: from ED Mental Orientation: A&Ox4 Telemetry: Yes, box 14 applied Assessment: See doc flowsheets Skin: Intact, rt thigh reddened IV: R hand IV NS@KVO  Pain: None Tubes: N/A Safety Measures: Patient Handbook has been given, and discussed the Fall Prevention worksheet. Left at bedside  Admission: Completed and admission orders have been written  6700 Orientation: Patient has been oriented to the unit, staff and to the room.  Family: At the bedside

## 2013-05-23 NOTE — Consult Note (Signed)
Indication for Consultation:  Management of ESRD/hemodialysis; anemia, hypertension/volume and secondary hyperparathyroidism  HPI: [name] is a 32 y.o. female who presented to the ED with fever/chills and pain in R thigh HD graft during HD this AM. She receives HD MWF at [facility], ESRD with a history of DM and HTN. She started to have generalized weakness and fatigue with possible fever and chills Saturday, this did not improve so she came to ED yesterday (8/17) but did not receive treatment here as she was feeling better, she said her fever broke last night. She went to HD this am and had pain in R thigh graft and hips during HD, her temp increased from 100.4 to 104, blood cultures were drawn and she received approx 1g Vanc. She signed off HD d/t pain, though pain was relieved with needles removed and came to ED.  Her graft was placed 04/07/13 and has been used without difficulty. She does report that she noticed the thrill was not as strong as it was previously. She had a R IJ cath removed 05/09/13. Last week she said she noticed a lump by R groin, which someone at HD said was an ingrown hair. She reports having a decreased appetite this weekend but denies any n/v/d or abd pain. Feeling better now, denies pain in R thigh graft, no chills now. Pt denies current pain in AVG- also no pain at site of previous PC removal- does still have fever in the ER  Past Medical History  Diagnosis Date  . Diabetes mellitus without complication   . Blind   . Hypothyroidism   . ESRD (end stage renal disease)   . Hypertension   . Hemodialysis patient    Past Surgical History  Procedure Laterality Date  . Insertion of dialysis catheter N/A 04/01/2013    Procedure: INSERTION OF DIALYSIS CATHETER;  Surgeon: Pryor Ochoa, MD;  Location: [facility] OR;  Service: Vascular;  Laterality: N/A;  . Av fistula placement Right 04/07/2013    Procedure: INSERTION OF ARTERIOVENOUS (AV) GORE-TEX GRAFT THIGH;  Surgeon: Chuck Hint, MD;  Location: [facility] OR;  Service: Vascular;  Laterality: Right;   Family History  Problem Relation Age of Onset  . Hypertension    . Cancer    . Thyroid disease     Social History: She denies smoking, drinking alcohol or using illicit drugs. She is accompanied by her partner.  No Known Allergies Prior to Admission medications   Medication Sig Start Date End Date Taking? Authorizing Provider  amLODipine (NORVASC) 10 MG tablet Take 1 tablet (10 mg total) by mouth at bedtime. 04/09/13  Yes Garnetta Buddy, MD  atorvastatin (LIPITOR) 40 MG tablet Take 1 tablet (40 mg total) by mouth daily. 04/21/13  Yes Hazeline Junker, MD  glucose blood test strip Use as instructed 04/09/13  Yes Garnetta Buddy, MD  insulin aspart (NOVOLOG) 100 UNIT/ML injection Inject 5-10 Units into the skin See admin instructions. Titration of additional 1 unit per every 50 spike in blood sugar level max of 200   Yes Historical Provider, MD  insulin glargine (LANTUS) 100 UNIT/ML injection Inject 12 Units into the skin 2 (two) times daily.    Yes Historical Provider, MD  lanthanum (FOSRENOL) 1000 MG chewable tablet Chew 1,000 mg by mouth 3 (three) times daily with meals.   Yes Historical Provider, MD  levothyroxine (SYNTHROID, LEVOTHROID) 75 MCG tablet Take 1 tablet (75 mcg total) by mouth daily. 04/21/13  Yes Hazeline Junker, MD  warfarin (COUMADIN) 2.5 MG tablet Take 7.5 mg by mouth daily.   Yes Historical Provider, MD   No current facility-administered medications for this encounter.   Current Outpatient Prescriptions  Medication Sig Dispense Refill  . amLODipine (NORVASC) 10 MG tablet Take 1 tablet (10 mg total) by mouth at bedtime.  90 tablet  3  . atorvastatin (LIPITOR) 40 MG tablet Take 1 tablet (40 mg total) by mouth daily.  90 tablet  3  . glucose blood test strip Use as instructed  100 each  12  . insulin aspart (NOVOLOG) 100 UNIT/ML injection Inject 5-10 Units into the skin See admin instructions. Titration of  additional 1 unit per every 50 spike in blood sugar level max of 200      . insulin glargine (LANTUS) 100 UNIT/ML injection Inject 12 Units into the skin 2 (two) times daily.       Marland Kitchen lanthanum (FOSRENOL) 1000 MG chewable tablet Chew 1,000 mg by mouth 3 (three) times daily with meals.      Marland Kitchen levothyroxine (SYNTHROID, LEVOTHROID) 75 MCG tablet Take 1 tablet (75 mcg total) by mouth daily.  90 tablet  3  . warfarin (COUMADIN) 2.5 MG tablet Take 7.5 mg by mouth daily.       Labs: Basic Metabolic Panel:  Recent Labs Lab 05/23/13 1231  NA 136  K 3.5  CL 94*  CO2 26  GLUCOSE 298*  BUN 21  CREATININE 4.33*  CALCIUM 8.5   Liver Function Tests: No results found for this basename: AST, ALT, ALKPHOS, BILITOT, PROT, ALBUMIN,  in the last 168 hours No results found for this basename: LIPASE, AMYLASE,  in the last 168 hours No results found for this basename: AMMONIA,  in the last 168 hours CBC:  Recent Labs Lab 05/23/13 1231  WBC 12.9*  NEUTROABS 11.6*  HGB 10.0*  HCT 30.3*  MCV 84.4  PLT 186   Cardiac Enzymes: No results found for this basename: CKTOTAL, CKMB, CKMBINDEX, TROPONINI,  in the last 168 hours CBG: No results found for this basename: GLUCAP,  in the last 168 hours Iron Studies: No results found for this basename: IRON, TIBC, TRANSFERRIN, FERRITIN,  in the last 72 hours Studies/Results: No results found.  ROS:  Review of Systems: Gen: Reports fever, chills, weakness, fatigue and decreased appetite HEENT: No Sore throat. CV: Denies chest pain,  Palpitations, and peripheral edema Resp: Denies sob, cough or wheezing,. GI: Denies n/v/d and abd pain but has decreased appetite. GU : Denies urinary burning, urinary frequency, urinary hesitancy, MS: Denies muscle ache  .  Physical Exam: Filed Vitals:   05/23/13 1129 05/23/13 1131 05/23/13 1210  BP:  149/78 145/68  Pulse: 141  128  Temp: 103.1 F (39.5 C)  103.2 F (39.6 C)  TempSrc:   Oral  Resp: 22  20  SpO2:  99%  98%     General: Well developed, well nourished, in no acute distress. Appears to not feel well. Head: Normocephalic, atraumatic, Neck: Supple. JVD not elevated. No carotid bruits Lungs: Clear bilaterally to auscultation without wheezes, rales, or rhonchi. Breathing is unlabored. Heart: Tachycardic, regular rhythm, no murmur Abdomen: Soft, non-tender, +BS x4  M-S:  Strength and tone appear normal for age. Lower extremities: No edema Skin- warm, flushed and dry. R thigh AVG site warm, no drainage. Old R IJ site healing well, no signs of infection Neuro: Alert and oriented X 3.  Psych:  Responds to questions appropriately with a normal affect. Dialysis Access:  R thigh AVG  +bruit and thrill (placed 7.3 by Dr Edilia Bo)- palpated all around AVG with no tender areas  Dialysis Orders: MWF [facility] 4 hours   65.5 kg    2K/2.25 Ca+    400/AF1.5     R thigh AVG   3000u heparin hectorol       Epo 10,000    Assessment/Plan: 1. Fever/chills/ graft pain- ? Bacteremia/infected AVG-  Blood cultures pending- also drawn at HD this am and she received about 1 g Vanc. Venous duplex pending. UA/UC pending.Temp 103.2. WBC 12.9. Zosyn and Vanc ordered. There is no tenderness to thigh AVG- am hoping that we may be able to cool off with IV abx- awaiting official read on AVG u/s 2.  ESRD -  MWF [facility]. K+ 3.5, HD this AM ran about 75%.  Does not need any further HD today, can wait until Wednesday 3.  Hypertension/volume  - 112/52. norvasc 10 mg, no volume excess. States no post wt at HD today. 4.  Anemia  - hgb 10,  Ferritin (7/7) 109.  on epo 10,000u  5.  Metabolic bone disease -  Ca+ 8.5. Phos 6.6(7/23) PTH 327 (7/7) Fosrenol with meals. Hectorol 1 mcg 6.  Nutrition - alb 3.0  nepro carb steady at outpt HD, needs supplements.  Decreased appetite this weekend. Continue home meds 7. DM- apart SS with meals and lantus 12u BID. Glucose 298 8. Upper ext DVT- on coumadin 7.5 mg daily, INR 2.77  (8/18)  Jetty Duhamel, NP [facility] [phone] 05/23/2013, 2:07 PM   Patient seen and examined, agree with above note with above modifications. 32 year old female with ESRD- she has had her thigh AVG since July 2014- just started using and had PC removed 05/09/13.  Felt badly over weekend with fever, came to hosp but ended up leaving before evald.  This AM on HD, had excalation of fever and pain in thigh AVG- pain resolved when she was taken off HD, but still had fever- bld cx drawn at HD ad here- vanc given- vanc and zosyn is ordered for here.  No other infectious type sxms. No obvious infection to AVG or old PC site-  Annie Sable, MD 05/23/2013

## 2013-05-24 ENCOUNTER — Encounter (HOSPITAL_COMMUNITY): Payer: Self-pay | Admitting: Student

## 2013-05-24 DIAGNOSIS — E11319 Type 2 diabetes mellitus with unspecified diabetic retinopathy without macular edema: Secondary | ICD-10-CM

## 2013-05-24 DIAGNOSIS — E1139 Type 2 diabetes mellitus with other diabetic ophthalmic complication: Secondary | ICD-10-CM

## 2013-05-24 DIAGNOSIS — Z95828 Presence of other vascular implants and grafts: Secondary | ICD-10-CM

## 2013-05-24 LAB — CBC
HCT: 28.4 % — ABNORMAL LOW (ref 36.0–46.0)
Hemoglobin: 9.2 g/dL — ABNORMAL LOW (ref 12.0–15.0)
MCH: 27.6 pg (ref 26.0–34.0)
MCHC: 32.4 g/dL (ref 30.0–36.0)
MCV: 85.3 fL (ref 78.0–100.0)
Platelets: 182 10*3/uL (ref 150–400)
RBC: 3.33 MIL/uL — ABNORMAL LOW (ref 3.87–5.11)
RDW: 12.6 % (ref 11.5–15.5)
WBC: 11.6 10*3/uL — ABNORMAL HIGH (ref 4.0–10.5)

## 2013-05-24 LAB — GLUCOSE, CAPILLARY
Glucose-Capillary: 341 mg/dL — ABNORMAL HIGH (ref 70–99)
Glucose-Capillary: 358 mg/dL — ABNORMAL HIGH (ref 70–99)
Glucose-Capillary: 342 mg/dL — ABNORMAL HIGH (ref 70–99)

## 2013-05-24 LAB — URINE CULTURE: Culture: NO GROWTH

## 2013-05-24 LAB — PROTIME-INR
INR: 3.66 — ABNORMAL HIGH (ref 0.00–1.49)
Prothrombin Time: 35 seconds — ABNORMAL HIGH (ref 11.6–15.2)

## 2013-05-24 MED ORDER — VANCOMYCIN HCL IN DEXTROSE 750-5 MG/150ML-% IV SOLN
750.0000 mg | INTRAVENOUS | Status: DC
  Administered 2013-05-25: 750 mg via INTRAVENOUS
  Filled 2013-05-24 (×2): qty 150

## 2013-05-24 MED ORDER — RENA-VITE PO TABS
1.0000 | ORAL_TABLET | Freq: Every day | ORAL | Status: DC
  Administered 2013-05-24: 1 via ORAL
  Filled 2013-05-24 (×2): qty 1

## 2013-05-24 MED ORDER — DOXERCALCIFEROL 4 MCG/2ML IV SOLN
1.0000 ug | INTRAVENOUS | Status: DC
  Administered 2013-05-25: 1 ug via INTRAVENOUS
  Filled 2013-05-24: qty 2

## 2013-05-24 MED ORDER — PIPERACILLIN-TAZOBACTAM IN DEX 2-0.25 GM/50ML IV SOLN
2.2500 g | Freq: Three times a day (TID) | INTRAVENOUS | Status: DC
  Administered 2013-05-24 – 2013-05-25 (×3): 2.25 g via INTRAVENOUS
  Filled 2013-05-24 (×6): qty 50

## 2013-05-24 MED ORDER — DARBEPOETIN ALFA-POLYSORBATE 100 MCG/0.5ML IJ SOLN
100.0000 ug | INTRAMUSCULAR | Status: DC
  Administered 2013-05-25: 100 ug via INTRAVENOUS
  Filled 2013-05-24: qty 0.5

## 2013-05-24 MED ORDER — AMLODIPINE BESYLATE 10 MG PO TABS
10.0000 mg | ORAL_TABLET | Freq: Every day | ORAL | Status: DC
  Filled 2013-05-24: qty 1

## 2013-05-24 NOTE — Discharge Summary (Signed)
Family Medicine [facility] Discharge Summary  Patient name: [name] Medical record number: [id number] Date of birth: 22-Jan-1981 Age: 32 y.o. Gender: female Date of Admission: 05/23/2013  Date of Discharge: 05/25/2013 Admitting Physician: Janit Pagan, MD  Primary Care Provider: Hazeline Junker, MD Consultants: NEPHROLOGY  Indication for Hospitalization: SHIRIN ECHEVERRY is a 32 y.o. year old female presenting with fever. PMH is significant for blindness and ESRD 2/2 DM type 1, HTN, hyperlipidemia, hypothyroidism and history of DVT.   Discharge Diagnoses/Problem List:  Fever, probable infected graft site Diabetes Type 1 Hx of DVT Hypertension Hypothyroidism Hyperlipidemia  Disposition: HOME  Discharge Condition: IMPROVED  Brief Hospital Course:  Pt presented to the ED with fever/chills and a recent history of pain and tenderness associated with her AV graft.  The concern upon admission was for infected an AV graft vs. bacteremia.  An RLE ultrasound in the ED preliminarily showed no obvious abscess.    #FEVER -  - renal consulted on admission, recommended consulting vascular surgery while continuing abx and rechallenging with stick at inhouse HD 8/20.  This was accomplished without incident and pt later discharged.  - RLE u/s did not show obvious abscess or fluctuant area; vascular felt fluid pocket was likely hematoma, not pre-graft infected fluid and agreed with nephro plan regarding repeated attempt at cannulation - leukocytosis resolved during stay -fever of 103.3 measured at dialysis earlier in the day decreased to 100.9 in the ED and soon resolved during the course of her stay. - started vanc/zosyn upon admission.  Discharged on vanc/ceftazidime for 2 weeks with hemodialysis, which nephrology will follow-up  - urine culture was no growth (final); blood cultures was NGTD x3d   #DIABETES TYPE I - A1C prior to admission (6/28) was 8.4, so moderately  well-controlled. - Pt was continued on her home lantus 12u BID. Her SSI was increased from sensitive to moderate following admission as her blood glucose ranged from 223-492, likely from infection. - Glucose continued to trend high, so added Novolog 3u TID with meals  # History DVT  - Continued on home coumadin.  Pharmacy managed while in-house.  #HYPERTENSION  - Pt's home amlodipine was held on admission as her BP was 105/50 and remained low until 8/19 when it began to trend upward again.  It was added back upon discharge.  #HYPOTHYROIDISM  - Continued home synthroid while in-house  #HYPERLIPIDEMIA  - Continued home lipitor while in-house   Issues for Follow Up: 1. FEVER - continue HD as scheduled with normal nephrology f/u.  Nephrology to manage vanc/fortaz for 2 weeks at OP HD. 2. Follow up patient pain and perceived swelling at graft site.   Significant Procedures: 8/20 - HD without incident  Significant Labs and Imaging:   Recent Labs Lab 05/23/13 1231 05/24/13 0430 05/25/13 0757  WBC 12.9* 11.6* 6.4  HGB 10.0* 9.2* 9.3*  HCT 30.3* 28.4* 28.5*  PLT 186 182 185    Recent Labs Lab 05/23/13 1231 05/25/13 0756  NA 136 136  K 3.5 3.3*  CL 94* 94*  CO2 26 28  GLUCOSE 298* 325*  BUN 21 46*  CREATININE 4.33* 7.68*  CALCIUM 8.5 8.5  PHOS  --  5.2*  ALBUMIN  --  2.3*     8/18 Right thigh AVG duplex .  Preliminary report: Graft patent. There is perigraft complex fluid collection at the arterial anastomosis and for a few inches along the graft. There is a discreet anechoic area in the groin that  does not appear to be a part of the perigraft fluid collection. There is no flow noted within this area.    Outstanding Results: blood culture NGTD x3d  Discharge Medications:    Medication List         amLODipine 10 MG tablet  Commonly known as:  NORVASC  Take 1 tablet (10 mg total) by mouth at bedtime.     atorvastatin 40 MG tablet  Commonly known as:  LIPITOR   Take 1 tablet (40 mg total) by mouth daily.     cefTAZidime 1 G injection  Commonly known as:  FORTAZ  To be administered and dosed M, W, F per nephrology with dialysis per nephrology     glucose blood test strip  Use as instructed     insulin aspart 100 UNIT/ML injection  Commonly known as:  novoLOG  Inject 5-10 Units into the skin See admin instructions. Titration of additional 1 unit per every 50 spike in blood sugar level max of 200     insulin glargine 100 UNIT/ML injection  Commonly known as:  LANTUS  Inject 12 Units into the skin 2 (two) times daily.     lanthanum 1000 MG chewable tablet  Commonly known as:  FOSRENOL  Chew 1,000 mg by mouth 3 (three) times daily with meals.     levothyroxine 75 MCG tablet  Commonly known as:  SYNTHROID, LEVOTHROID  Take 1 tablet (75 mcg total) by mouth daily.     Vancomycin 750 MG/150ML Soln  Commonly known as:  VANCOCIN  Inject 150 mLs (750 mg total) into the vein every Monday, Wednesday, and Friday with hemodialysis.     warfarin 2.5 MG tablet  Commonly known as:  COUMADIN  Take 7.5 mg by mouth daily.        Discharge Instructions: Please refer to Patient Instructions section of EMR for full details.  Patient was counseled important signs and symptoms that should prompt return to medical care, changes in medications, dietary instructions, activity restrictions, and follow up appointments.   Follow-Up Appointments:  Follow-up Information   Follow up with Hazeline Junker, MD On 06/10/2013. (2p)    Specialty:  Family Medicine   Contact information:   [address] [place] [address] [phone]       Follow up with Homodialysis In 2 days. (MWF)      Ezzard Flax, Med Student 05/25/2013, 11:27 AM  Family Medicine Upper Level Addendum:   I have seen and examined the patient independently, discussed with Student Doctor Marquita Palms, fully reviewed the H+P and agree with it's contents with the additions as noted in blue text. My  independent exam is below.    Tana Conch, MD, PGY3 05/25/2013 12:08 PM

## 2013-05-24 NOTE — Consult Note (Signed)
[facility] ADMIT NOTE 05/24/2013 DOB: 1981-03-25 MRN : [id number]  WU:[id number] and redness in right thigh graft  History of Present Illness: [name] is a 32 y.o. female who had a right thigh graft placed by Dr. Edilia Bo on 04/07/13. Pt states she was doing well until Friday evening after dialysis when she developed fever and chills which lasted for approx 36 hours. Her fever "broke" on Sunday and she went to her normal dialysis appt on Monday . She had developed a swelling around the groin incision on Monday which got smaller after HD. Her fever returned on HD and the skin around graft was noted to be red. BC were drawn on 8/18 which are negative thus far.  She is on Vacomycin and zosyn.  She states that the area just medial to groin incision was very painful especially on HD. She states the pain in the groin has pretty much resolved since yesterday.  She resumes HD tomorrow  Pt had BUE DVT and is on coumadin  Pt Venogram of BUE on 04/04/13 by Dr. Arbie Cookey - ResultsOn the left arm there was opacification from a branch into the brachial vein at the antecubital space. The vein was small but was patent throughout the course to include the subclavian vein and innominate vein. There was no evidence of clot in the brachial vein  The left arm venogram showed a venous structures throughout the arm. Again the veins were small but the brachial vein was patent with no evidence of thrombus. This was a patent through the axilla subclavian and innominate vein on the left as well. There were no immediate complications and the patient was transferred back to the floor  Findings bilateral upper extremity venograms with no evidence of thrombus or occlusion. Patent central veins bilaterally.  Pt Diabetic: Yes, type 1 Pt smoker: No . Pt meds include: Statin :No Betablocker: No ASA: No Other anticoagulants/antiplatelets: warfarin  Past Medical History  Diagnosis Date  . 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 Hypothyroidism   . Hypertension   . Type I diabetes mellitus   . DVT (deep venous thrombosis)     "both arms" (05/23/2013)  . End-stage renal disease on hemodialysis     "MWF; [facility]" (05/23/2013)  . High cholesterol   . Heart murmur     SEM  . Diabetic retinopathy associated with type 1 diabetes mellitus   . Graves' disease     "had thyroid radioactively treated" (05/23/2013)    Past Surgical History  Procedure Laterality Date  . Insertion of dialysis catheter N/A 04/01/2013    Procedure: INSERTION OF DIALYSIS CATHETER;  Surgeon: Pryor Ochoa, MD;  Location: [facility] OR;  Service: Vascular;  Laterality: N/A;  . Av fistula placement Right 04/07/2013    Procedure: INSERTION OF ARTERIOVENOUS (AV) GORE-TEX GRAFT THIGH;  Surgeon: Chuck Hint, MD;  Location: [facility] OR;  Service: Vascular;  Laterality: Right;  . Cataract extraction w/ intraocular lens implant Right 2009  . Eye surgery Right 2009    "laser OR for diabetic retinopathy" (05/23/2013)  . Enucleation Left ~ 2010    Social History History  Substance Use Topics  . Smoking status: Never Smoker   . Smokeless tobacco: Never Used  . Alcohol Use: No    Family History Family History  Problem Relation Age of Onset  . Hypertension    . Cancer    . Thyroid disease  No Known Allergies  Current Facility-Administered Medications  Medication Dose Route Frequency Provider Last Rate Last Dose  . acetaminophen (TYLENOL) tablet 650 mg  650 mg Oral Q6H PRN Shelva Majestic, MD       Or  . acetaminophen (TYLENOL) suppository 650 mg  650 mg Rectal Q6H PRN Shelva Majestic, MD      . atorvastatin (LIPITOR) tablet 40 mg  40 mg Oral q1800 Shelva Majestic, MD   40 mg at 05/23/13 2005  . [START ON 05/25/2013] darbepoetin (ARANESP) injection 100 mcg  100 mcg Intravenous Q Wed-HD Sheffield Slider, PA-C      . Melene Muller ON 05/25/2013] doxercalciferol (HECTOROL) injection 1 mcg  1 mcg Intravenous  Q M,W,F-HD Sheffield Slider, PA-C      . insulin aspart (novoLOG) injection 0-9 Units  0-9 Units Subcutaneous TID WC Tawni Carnes, MD   7 Units at 05/24/13 1235  . insulin glargine (LANTUS) injection 12 Units  12 Units Subcutaneous BID Shelva Majestic, MD   12 Units at 05/24/13 1106  . lanthanum (FOSRENOL) chewable tablet 1,000 mg  1,000 mg Oral TID WC Shelva Majestic, MD   1,000 mg at 05/24/13 1232  . levothyroxine (SYNTHROID, LEVOTHROID) tablet 75 mcg  75 mcg Oral QAC breakfast Shelva Majestic, MD   75 mcg at 05/24/13 [phone]  . multivitamin (RENA-VIT) tablet 1 tablet  1 tablet Oral QHS Sheffield Slider, PA-C      . sodium chloride 0.9 % injection 3 mL  3 mL Intravenous Q12H Shelva Majestic, MD   3 mL at 05/23/13 2149  . warfarin (COUMADIN) tablet 7.5 mg  7.5 mg Oral q1800 Gardner Candle, RPH   7.5 mg at 05/23/13 2005  . Warfarin - Pharmacist Dosing Inpatient   Does not apply q1800 Jessica C Carney, RPH        ROS: [x]  Positive  [ ]  Denies    General: [ ]  Weight loss, [x ] Fever, [x ] chills Neurologic: [ ]  Dizziness, [ ]  Blackouts, [ ]  Seizure [ ]  Stroke, [ ]  "Mini stroke", [ ]  Slurred speech, [ ]  Temporary blindness; [ ]  weakness in arms or legs, [ ]  Hoarseness [ ]  Dysphagia Cardiac: [ ]  Chest pain/pressure, [ ]  Shortness of breath at rest [ ]  Shortness of breath with exertion, [ ]  Atrial fibrillation or irregular heartbeat  Vascular: [ ]  Pain in legs with walking, [ ]  Pain in legs at rest, [ ]  Pain in legs at night,  [ ]  Non-healing ulcer, [ ]  Blood clot in vein/DVT,   Pulmonary: [ ]  Home oxygen, [ ]  Productive cough, [ ]  Coughing up blood, [ ]  Asthma,  [ ]  Wheezing [ ]  COPD Musculoskeletal:  [ ]  Arthritis, [ ]  Low back pain, [ ]  Joint pain Hematologic: [ ]  Easy Bruising, [ ]  Anemia; [ ]  Hepatitis Gastrointestinal: [ ]  Blood in stool, [ ]  Gastroesophageal Reflux/heartburn, Urinary: [x ] chronic Kidney disease, [x ] on HD - [x ] MWF or [ ]  TTHS, [ ]  Burning with urination, [ ]   Difficulty urinating Skin: [ ]  Rashes, [ ]  Wounds Psychological: [ ]  Anxiety, [ ]  Depression  Physical Examination Filed Vitals:   05/23/13 1813 05/23/13 2101 05/24/13 0517 05/24/13 0934  BP: 114/58 111/57 112/62 104/59  Pulse: 89 88 87 81  Temp: 99.2 F (37.3 C) 98.6 F (37 C) 98.4 F (36.9 C) 97.9 F (36.6 C)  TempSrc: Oral Oral Oral   Resp: 18  18 18 17   Height:  5' 2.5" (1.588 m)    Weight:  148 lb 9.4 oz (67.4 kg)    SpO2: 96% 95% 98% 98%   Body mass index is 26.73 kg/(m^2).  General:  WDWN in NAD HENT: WNL Eyes: Pupils equal Pulmonary: normal non-labored breathing  Cardiac: RRR Skin: no rashes, ulcers noted Vascular Exam/Pulses:palp DP/PT Right Thigh graft - no tenderness, erythema or fluctuance noted  Good thrill and bruit in graft Extremities without ischemic changes, no Gangrene , no cellulitis; no open wounds;  Musculoskeletal: no muscle wasting or atrophy  Neurologic: A&O X 3; Appropriate Affect ;  SENSATION: normal; MOTOR FUNCTION: Pt has good and equal strength in all extremities Speech is fluent/normal  Significant Diagnostic Studies: CBC Lab Results  Component Value Date   WBC 11.6* 05/24/2013   HGB 9.2* 05/24/2013   HCT 28.4* 05/24/2013   MCV 85.3 05/24/2013   PLT 182 05/24/2013    BMET    Component Value Date/Time   NA 136 05/23/2013 1231   K 3.5 05/23/2013 1231   CL 94* 05/23/2013 1231   CO2 26 05/23/2013 1231   GLUCOSE 298* 05/23/2013 1231   BUN 21 05/23/2013 1231   CREATININE 4.33* 05/23/2013 1231   CALCIUM 8.5 05/23/2013 1231   CALCIUM 7.7* 04/04/2013 1745   GFRNONAA 13* 05/23/2013 1231   GFRAA 15* 05/23/2013 1231   Estimated Creatinine Clearance: 17.5 ml/min (by C-G formula based on Cr of 4.33).  COAG Lab Results  Component Value Date   INR 3.66* 05/24/2013   INR 2.77* 05/23/2013   INR 1.7 05/20/2013   No results found for this basename: PTT     ASSESSMENT/PLAN: Erythema and pain in right thigh HD graft which is resolving on IV  antibiotics in Diabetic pt with resolved fever and chills. Functioning right Thigh graft is now without erythema, tenderness or fluctuance. Continue IV antibiotics as graft does not appear grossly infected. Pt to dialyze again in am.  Complicated pt with recent BUE DVT - see venogram results in HPI  Keiva Dina J 05/24/2013 1:34 PM

## 2013-05-24 NOTE — Consult Note (Signed)
I agree with the above note.  I have reviewed her ultrasound.  There is a small amount of fluid around the arterial anastamosis.  To me, this like more like hematoma than peri-graft fluid that would be seen with an infected graft.  I would recommend a trial of antibiotics and repeat attempt at canulation of the graft.  If she continues to have problems with pain and fevers, she may require surgical exploration and removal of the graft if it was infected.  Durene Cal

## 2013-05-24 NOTE — Progress Notes (Signed)
Family Medicine Teaching Service Daily Progress Note Intern Pager: [phone]  Patient name: [name] Medical record number: [id number] Date of birth: 1981-06-08 Age: 32 y.o. Gender: female  Primary Care Provider: Hazeline Junker, MD Consultants: NEPHROLOGY Code Status: FULL  Pt Overview and Major Events to Date: fever and recent pain associated with her AVG  8/18 - RLE U/S, started vanc/zosyn  Assessment and Plan: [name] is a 32 y.o. year old female presenting with fever. PMH is significant for blindness and ESRD 2/2 DM type 1, HTN, hyperlipidemia, hypothyroidism and history of DVT.   #FEVER - patient w/ 4-day history of fever and pain associated with her AVG during dialysis. Pt had HD Friday, which was when she began to feel sick. She felt worse throughout the weekend and had new pain at her graft site at HD Monday. Concerned for infected AVG vs. Bacteremia.  - renal consulted, appreciate rec's  - WBC trending down: 12.9 --> 11.6 (8/19) [ ]  f/u blood and urine cultures.  NGTD x2d  - continue prophylactic vanc/zosyn until culture results are back   - consider vascular consult following resolution of infection to determine status of graft [ ]  f/u final read of ultrasound. Prelim: *Site does not appear to have obvious abscess or fluctuant area and not erythematous. Hopeful local infection that will calm antibiotics.  *no further imaging planned currently but appreciate renal and attending input. Would also consider surgical consult if needed.   #DIABETES Type I- patient recently not checking glucose regularly at home, moderately well-controlled  - A1C on 6/28 was 8.4  - continue home lantus [ ]  increase SSI usage as CBG has ranged from 223-492; infection may be increasing sugars.   # HX of DVT  - pharm managing coumadin; INR 2.77 --> 3.66 (8/19)  #HYPERTENSION  - continue to hold home amlodipine for now with last BP 112/62 but may be able to restart in coming days    #HYPOTHYROIDISM  - continue home synthroid   #HYPERLIPIDEMIA  - continue home lipitor   FEN/GI: renal diet, saline lock  Prophylaxis: continue home warfarin   Disposition: home pending resolution of symptoms and patent graft  Subjective: patient without fever/chills this morning, some TTP along graft, otherwise feeling well  Objective: Temp:  [98.4 F (36.9 C)-103.2 F (39.6 C)] 98.4 F (36.9 C) (08/19 0517) Pulse Rate:  [87-141] 87 (08/19 0517) Resp:  [16-22] 18 (08/19 0517) BP: (105-149)/(50-78) 112/62 mmHg (08/19 0517) SpO2:  [93 %-99 %] 98 % (08/19 0517) Weight:  [148 lb 9.4 oz (67.4 kg)] 148 lb 9.4 oz (67.4 kg) (08/18 2101) Physical Exam: General: alert, cooperative and no distress  HEENT: patient blind, left eye is artificial; Right pupil round and reactive to light, extra ocular movement intact Heart: 3/6 SEM, no rub or gallop, regular rate and rhythm  Lungs: clear to auscultation, no wheezes or rales and unlabored breathing  Abdomen: abdomen is soft without significant tenderness, masses, organomegaly or guarding  Extremities: RLE graft in mid thigh with mild TTP, extremities otherwise normal, atraumatic, no cyanosis or edema, specifically no calf pain, swelling or tenderness.  Skin:no rashes, no ecchymoses  Neurology: normal without focal findings, mental status, speech normal, alert and oriented x3  Laboratory:  Recent Labs Lab 05/23/13 1231 05/24/13 0430  WBC 12.9* 11.6*  HGB 10.0* 9.2*  HCT 30.3* 28.4*  PLT 186 182    Recent Labs Lab 05/23/13 1231  NA 136  K 3.5  CL 94*  CO2 26  BUN 21  CREATININE 4.33*  CALCIUM 8.5  GLUCOSE 298*    Imaging/Diagnostic Tests: 8/18 Right thigh AVG duplex .  Preliminary report: Graft patent. There is perigraft complex fluid collection at the arterial anastomosis and for a few inches along the graft. There is a discreet anechoic area in the groin that does not appear to be a part of the perigraft fluid  collection. There is no flow noted within this area.   Ezzard Flax, Med Student 05/24/2013, 9:12 AM FPTS Intern pager: [phone], text pages welcome

## 2013-05-24 NOTE — H&P (Signed)
Attending Addendum  I examined the patient and discussed the assessment and plan with Dr. Durene Cal. I have reviewed the note and agree.  Briefly, 32 yo F with type 1 diabetes, hypothyroidism, ESRD DD MWF, blindness presents with fever, tachycardia and elevated WBC  in the setting of 3 days of pain at R groin AV graft. She received a Vancomycin load yesteday after blood cultures were obtained at HD.  U/S evaluation of the site reveals a questionable abscess. She is doing well this AM. denies fever and chills.   O: VSS CBGs high 492 (overnight)> 260 (fasting)  Gen: alert, awake, NAD. Pleasant.  Chest: S1S2 RRR, nml WOB, CTA b/l R LE: AV graft with palpable thrill and audible bruit, no fluctance, no erythema. 2+ peripheral pulses.   A/P: 32 yo F presenting with SIRS with source of infection being R femoral AV graft site.   1. Sepsis w/o septic shock: A: improved on Vanc and Zosyn P: f/u results of  blood cultures obtained at dialysis center.  Continue current antibiotic coverage.   2. Hyperglycemia: A: due to infection and change in home insulin schedule. Patient takes 12 U lantus BID and novolog SSI at home, same here, but she times her insulin closer to meals.   P: Increase SSI to moderate with meal coverag. Continue renal diet, low potassium.   Dessa Phi, MD FAMILY MEDICINE TEACHING SERVICE

## 2013-05-24 NOTE — Progress Notes (Signed)
[facility] Progress Note  Subjective:   No N and V. Ate all of breakfast. Notes some tingling on the medial side of right leg graft but again not overwhelmingly tender  Objective Filed Vitals:   05/23/13 1700 05/23/13 1813 05/23/13 2101 05/24/13 0517  BP: 111/60 114/58 111/57 112/62  Pulse: 90 89 88 87  Temp:  99.2 F (37.3 C) 98.6 F (37 C) 98.4 F (36.9 C)  TempSrc:  Oral Oral Oral  Resp:  18 18 18   Height:   5' 2.5" (1.588 m)   Weight:   67.4 kg (148 lb 9.4 oz)   SpO2: 97% 96% 95% 98%   Physical Exam General: NAD Heart: RRR 2/6 murmur LSB Lungs:no wheezes or rales Abdomen: soft NT Extremities: no sig edema  Dialysis Access: right thigh graft patent no tenderness or erythema noted Dialysis Orders: MWF [facility]  4 hours 65.5 kg 2K/2.25 Ca+ 400/AF1.5 R thigh AVG 3000u heparin  hectorol Epo 10,000   Assessment/Plan: 1. Fever/graft pain - BC x 2 pending drawn pre antibiotics at dialysis and also here after about 1 gm of Vanc; Fevers down. Venous duplex shows a perigraft complex fluid collection of fluid near art anastamosis and several inches along the graft. Not sure of significance.  Will ask VVS opinion but for now plan on continuing antibiotics and rechallenge with stick with regular HD tomorrow to be done inhouse.  Continue vanc and zosyn for now.  If has another episode of fever and graft pain tomorrow am afraid it means that graft is infected and may need to be removed ?  2. ESRD - MWF - HD Wed per routine 3. Anemia - Hgb 10 down to 9.2; Aranesp q Wed - no prior iron studies at her dialysis unit.  Last ferritin 109 in July tsat 31% in June; will check Fe studies with HD Wed. 4. Secondary hyperparathyroidism - hectorol with HD and check P with Wed labs; start fosrenol 1 gm ac 5. HTN/volume - volume control only at the moment; normally on norvasc 10; will continue to hold 6. Nutrition - high protein renal diet and multivitamin 7. 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 DM - per primary on statin 9. Hypothyroidism - on synthroid  Sheffield Slider, PA-C [facility] Beeper [phone] 05/24/2013,8:39 AM  LOS: 1 day   Patient seen and examined, agree with above note with above modifications. No distress this AM and did defervese- not tender but there is a question of this fluid collection.  Have asked VVS to look at and will plan to try and stick and run her tomorrow with her regular HD treatment.   Annie Sable, MD 05/24/2013      Additional Objective Labs: Basic Metabolic Panel:  Recent Labs Lab 05/23/13 1231  NA 136  K 3.5  CL 94*  CO2 26  GLUCOSE 298*  BUN 21  CREATININE 4.33*  CALCIUM 8.5   CBC:  Recent Labs Lab 05/23/13 1231 05/24/13 0430  WBC 12.9* 11.6*  NEUTROABS 11.6*  --   HGB 10.0* 9.2*  HCT 30.3* 28.4*  MCV 84.4 85.3  PLT 186 182   Lab Results  Component Value Date   INR 3.66* 05/24/2013   INR 2.77* 05/23/2013   INR 1.7 05/20/2013    Blood Culture    Component Value Date/Time   SDES BLOOD HAND RIGHT 05/23/2013 1240   SPECREQUEST BOTTLES DRAWN AEROBIC ONLY 10CC 05/23/2013 1240   CULT  Value:        BLOOD CULTURE RECEIVED NO GROWTH TO DATE CULTURE WILL BE HELD FOR 5 DAYS BEFORE ISSUING A FINAL NEGATIVE REPORT Performed at [facility] 05/23/2013 1240   REPTSTATUS PENDING 05/23/2013 1240  CBG:  Recent Labs Lab 05/23/13 1836 05/23/13 2100 05/24/13 0723  GLUCAP 223* 492* 260*  Medications:   . atorvastatin  40 mg Oral q1800  . insulin aspart  0-9 Units Subcutaneous TID WC  . insulin glargine  12 Units Subcutaneous BID  . lanthanum  1,000 mg Oral TID WC  . levothyroxine  75 mcg Oral QAC breakfast  . sodium chloride  3 mL Intravenous Q12H  . warfarin  7.5 mg Oral q1800  . Warfarin - Pharmacist Dosing Inpatient   Does not apply [phone]

## 2013-05-24 NOTE — Progress Notes (Signed)
FPTS Upper Level Addendum  S: Pt seen at bedside, feeling better. Generally has less tenderness in her leg and no longer feels feverish. No complaints, otherwise.  O: I have reviewed the patient's current medications, labs/imaging, and diagnostic tests. BP 122/69  Pulse 80  Temp(Src) 99.8 F (37.7 C) (Oral)  Resp 17  Ht 5' 2.5" (1.588 m)  Wt 148 lb 9.4 oz (67.4 kg)  BMI 26.73 kg/m2  SpO2 99%  LMP 04/21/2013 Gen: adult female, alert/cooperative in NAD; NOTE PT IS BLIND HEENT: left eye artificial; right pupil RRL, EOMI Pulm: RRR, systolic ejection murmur noted at baseline Pulm: CTAB, normal WOB Abd: soft, nontender, BS+ Ext: mid thigh with mild tenderness in area of graft, no fluctuance or obvious cellulitic changes  Otherwise LE without edema or calf  pain  A/P: 32 y.o. year old female presenting with fever and thigh pain on dialysis, with suspicion for infected HD graft (placed originally July 2014). PMH is significant for blindness, ESRD on HD, diabetes type 1, HTN, HLD, hypothyroidism. Pt also with hx of DVT.   #Fever/leg pain - likely secondary to acute infection, in/around graft; fevers and RLE pain improving on abx - nephrology and VVS consulted, appreciate recs; plan to continue HD to evaluate for graft functioning - WBC trend 12.9 > 11.6 - continue vanc/Zosyn and f/u cultures; will need to consider what abx to change to PO, duration of therapy, etc  #Diabetes type 1 - last A1c 8.4 - continue home Lantus 12 units BID, sensitive SSI; consider increase to moderate  #HTN - at baseline, on amlodipine 10 mg daily; BP's here more normotensive -hold amlodipine; consider restart at discharge  #HLD, hypothyroidism - home Synthroid 75 mcg daily, home Lipitor  FEN/GI: renal diet, saline lock IV Prophylaxis: hx of DVT's, continue warfarin per pharmacy  Disposition: management as above; discharge likely home pending resolution of likely infection  -if graft does not function well  and/or fevers continue, may need to consider further VVS intervention  I have independently interviewed and examined this patient in conjunction with Mr. Marquita Palms, MS4, whose note is documented separately. The above note reflects my independent interview, exam, and assessment/plan. Please see also attending notes.   Bobbye Morton, MD  PGY-2, [facility]  05/24/2013, 4:34 PM FPTS Service Pager: [phone]

## 2013-05-24 NOTE — Progress Notes (Signed)
FMTS Attending Admission Note: Susan Eniola,MD I  have seen and examined this patient, reviewed their chart. I have discussed this patient with the resident and medical student. I agree with the resident's findings, assessment and care plan.  

## 2013-05-24 NOTE — Progress Notes (Signed)
Inpatient Diabetes Program Recommendations  AACE/ADA: New Consensus Statement on Inpatient Glycemic Control (2013)  Target Ranges:  Prepandial:   less than 140 mg/dL      Peak postprandial:   less than 180 mg/dL (1-2 hours)      Critically ill patients:  140 - 180 mg/dL   Reason for Visit: Results for ERIEL, DUNCKEL (MRN [id number]) as of 05/24/2013 13:50  Ref. Range 05/23/2013 18:36 05/23/2013 21:00 05/24/2013 07:23 05/24/2013 11:35  Glucose-Capillary Latest Range: 70-99 mg/dL 629 (H) 528 (H) 413 (H) 341 (H)   Please consider Novolog meal coverage 3 units tid with meals (Hold if patient eats less than 50%).

## 2013-05-24 NOTE — Progress Notes (Signed)
ANTICOAGULATION CONSULT NOTE - Follow Up Consult  Pharmacy Consult for Warfarin  Indication: DVT  No Known Allergies  Patient Measurements: Height: 5' 2.5" (158.8 cm) Weight: 148 lb 9.4 oz (67.4 kg) IBW/kg (Calculated) : 51.25  Vital Signs: Temp: 98.8 F (37.1 C) (08/19 1341) Temp src: Oral (08/19 0517) BP: 116/66 mmHg (08/19 1341) Pulse Rate: 81 (08/19 1341)  Labs:  Recent Labs  05/23/13 1231 05/24/13 0430  HGB 10.0* 9.2*  HCT 30.3* 28.4*  PLT 186 182  LABPROT 28.3* 35.0*  INR 2.77* 3.66*  CREATININE 4.33*  --     Estimated Creatinine Clearance: 17.5 ml/min (by C-G formula based on Cr of 4.33).   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 INR with large jump today (3.66<2.77), unsure why. Hgb 9.2 (from 10). No overt bleeding noted. ESRD on HD MWF.   Goal of Therapy:  INR 2-3 Monitor platelets by anticoagulation protocol: Yes   Plan:  -Hold warfarin tonight given large jump in INR -Daily PT/INR -Monitor for bleeding  Thank you for allowing me to take part in this patient's care,  Abran Duke, PharmD Clinical Pharmacist Phone: [phone] Pager: [phone] 05/24/2013 2:07 PM

## 2013-05-24 NOTE — Progress Notes (Signed)
FMTS Attending Admission Note: Kehinde Eniola,MD I  have seen and examined this patient, reviewed their chart. I have discussed this patient with the resident. I agree with the resident's findings, assessment and care plan.  

## 2013-05-25 DIAGNOSIS — Z95828 Presence of other vascular implants and grafts: Secondary | ICD-10-CM

## 2013-05-25 DIAGNOSIS — I82629 Acute embolism and thrombosis of deep veins of unspecified upper extremity: Secondary | ICD-10-CM

## 2013-05-25 DIAGNOSIS — H543 Unqualified visual loss, both eyes: Secondary | ICD-10-CM

## 2013-05-25 LAB — GLUCOSE, CAPILLARY: Glucose-Capillary: 134 mg/dL — ABNORMAL HIGH (ref 70–99)

## 2013-05-25 LAB — RENAL FUNCTION PANEL
Albumin: 2.3 g/dL — ABNORMAL LOW (ref 3.5–5.2)
BUN: 46 mg/dL — ABNORMAL HIGH (ref 6–23)
CO2: 28 mEq/L (ref 19–32)
Calcium: 8.5 mg/dL (ref 8.4–10.5)
Chloride: 94 mEq/L — ABNORMAL LOW (ref 96–112)
Creatinine, Ser: 7.68 mg/dL — ABNORMAL HIGH (ref 0.50–1.10)
GFR calc Af Amer: 7 mL/min — ABNORMAL LOW (ref >90)
GFR calc non Af Amer: 6 mL/min — ABNORMAL LOW (ref >90)
Glucose, Bld: 325 mg/dL — ABNORMAL HIGH (ref 70–99)
Phosphorus: 5.2 mg/dL — ABNORMAL HIGH (ref 2.3–4.6)
Potassium: 3.3 mEq/L — ABNORMAL LOW (ref 3.5–5.1)
Sodium: 136 mEq/L (ref 135–145)

## 2013-05-25 LAB — CBC
HCT: 28.5 % — ABNORMAL LOW (ref 36.0–46.0)
Hemoglobin: 9.3 g/dL — ABNORMAL LOW (ref 12.0–15.0)
MCH: 27.7 pg (ref 26.0–34.0)
MCHC: 32.6 g/dL (ref 30.0–36.0)
MCV: 84.8 fL (ref 78.0–100.0)
Platelets: 185 10*3/uL (ref 150–400)
RBC: 3.36 MIL/uL — ABNORMAL LOW (ref 3.87–5.11)
RDW: 12.7 % (ref 11.5–15.5)
WBC: 6.4 10*3/uL (ref 4.0–10.5)

## 2013-05-25 LAB — IRON AND TIBC
Iron: 13 ug/dL — ABNORMAL LOW (ref 42–135)
Saturation Ratios: 6 % — ABNORMAL LOW (ref 20–55)
TIBC: 215 ug/dL — ABNORMAL LOW (ref 250–470)
UIBC: 202 ug/dL (ref 125–400)

## 2013-05-25 LAB — HEPATITIS B SURFACE ANTIGEN: Hepatitis B Surface Ag: NEGATIVE

## 2013-05-25 MED ORDER — DARBEPOETIN ALFA-POLYSORBATE 100 MCG/0.5ML IJ SOLN
INTRAMUSCULAR | Status: AC
  Filled 2013-05-25: qty 0.5

## 2013-05-25 MED ORDER — VANCOMYCIN HCL IN DEXTROSE 750-5 MG/150ML-% IV SOLN: 750.0000 mg | INTRAVENOUS | Status: DC

## 2013-05-25 MED ORDER — AMLODIPINE BESYLATE 10 MG PO TABS
10.0000 mg | ORAL_TABLET | Freq: Every day | ORAL | Status: DC
  Filled 2013-05-25: qty 1

## 2013-05-25 MED ORDER — CEFTAZIDIME 1 G IJ SOLR: INTRAMUSCULAR | Status: DC

## 2013-05-25 MED ORDER — INSULIN ASPART 100 UNIT/ML ~~LOC~~ SOLN
3.0000 [IU] | Freq: Three times a day (TID) | SUBCUTANEOUS | Status: DC
  Administered 2013-05-25: 3 [IU] via SUBCUTANEOUS

## 2013-05-25 MED ORDER — WARFARIN SODIUM 4 MG PO TABS
4.0000 mg | ORAL_TABLET | Freq: Once | ORAL | Status: AC
  Administered 2013-05-25: 4 mg via ORAL
  Filled 2013-05-25: qty 1

## 2013-05-25 MED ORDER — DOXERCALCIFEROL 4 MCG/2ML IV SOLN
INTRAVENOUS | Status: AC
  Filled 2013-05-25: qty 2

## 2013-05-25 NOTE — Progress Notes (Signed)
Family Medicine Teaching Service Daily Progress Note Intern Pager: [phone]  Patient name: [name] Medical record number: [id number] Date of birth: [birth year] Age: 32 y.o. Gender: female  Primary Care Provider: Hazeline Junker, MD Consultants: NEPHROLOGY, VASCULAR SURGERY Code Status: FULL  Pt Overview and Major Events to Date:  fever and recent pain associated with her AVG  8/18 - RLE U/S, started vanc/zosyn 8/20 - HD  Assessment and Plan: [name] is a 32 y.o. year old female presenting with fever. PMH is significant for blindness and ESRD 2/2 DM type 1, HTN, hyperlipidemia, hypothyroidism and history of DVT.   #FEVER - patient w/ 4-day history of fever and pain associated with her AVG during dialysis. Pt had HD Friday, which was when she began to feel sick. She felt worse throughout the weekend and had new pain at her graft site at HD Monday. Concerned for infected AVG vs. Bacteremia.  - renal consulted, appreciate rec's  - vascular surgery read of u/s: likely hematoma, not peri-graft infected fluid.  Rec. abx trial and repeat cannulation attempt. [ ]  f/u HD Wednesday (8/20), see if symptomatic [ ]  f/u blood cultures. NGTD since admission - urine culture no growth (final) - continue prophylactic vanc/zosyn until blood culture results are back.  D/c on vanc/ceftazidime per nephro, which they will follow at HD appointments.  #DIABETES Type I- patient recently not checking glucose regularly at home, moderately well-controlled  - A1C on 6/28 was 8.4  - continue home lantus  - continue moderate SSI usage as CBG has ranged from 341-358 [ ]  add Novolog 3u TID per diabetes coordinator  # HX of DVT  - pharm managing coumadin; INR 2.77 --> 3.66 (8/19)   #HYPERTENSION  [ ]  restart home amlodipine as BPs have returned to ~ 153/80  #HYPOTHYROIDISM  - continue home synthroid   #HYPERLIPIDEMIA  - continue home lipitor   FEN/GI: renal diet, saline lock  Prophylaxis: continue  home warfarin  Disposition: home pending HD without pain, fevers  Subjective: XXX  Objective: Temp:  [97.5 F (36.4 C)-99.8 F (37.7 C)] 97.5 F (36.4 C) (08/20 0732) Pulse Rate:  [71-81] 74 (08/20 0800) Resp:  [17-18] 17 (08/20 0739) BP: (104-150)/(59-79) 142/79 mmHg (08/20 0800) SpO2:  [96 %-99 %] 96 % (08/20 0732) Weight:  [145 lb 8.1 oz (66 kg)-148 lb 9.4 oz (67.399 kg)] 145 lb 8.1 oz (66 kg) (08/20 0732) Physical Exam: General: alert, cooperative and no distress  HEENT: patient blind, 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  Skin:no rashes, no ecchymoses  Neurology: normal without focal findings, mental status, speech normal, alert and oriented x3  Laboratory:  Recent Labs Lab 05/23/13 1231 05/24/13 0430 05/25/13 0757  WBC 12.9* 11.6* 6.4  HGB 10.0* 9.2* 9.3*  HCT 30.3* 28.4* 28.5*  PLT 186 182 185    Recent Labs Lab 05/23/13 1231  NA 136  K 3.5  CL 94*  CO2 26  BUN 21  CREATININE 4.33*  CALCIUM 8.5  GLUCOSE 298*    Imaging/Diagnostic Tests: 8/18 Right thigh AVG duplex .  Preliminary report: Graft  patent. There is perigraft complex fluid collection at the arterial anastomosis and for a few inches along the graft. There is a discreet anechoic area in the groin that does not appear to be a part of the perigraft fluid collection. There is no flow noted within this area.   Ezzard Flax, Med Student 05/25/2013, 8:36 AM FPTS Intern pager: [phone], text pages welcome  FPTS Upper Level Addendum  S: Pt seen at  hemodialysis. Feels well today. No complaints. No tenderness at graft site.   O: I have reviewed the patient's current medications, labs/imaging, and diagnostic tests. BP 154/82  Pulse 74  Temp(Src) 97.5 F (36.4 C) (Oral)  Resp 20  Ht 5' 2.5" (1.588 m)  Wt 145 lb 8.1 oz (66 kg)  BMI 26.17 kg/m2  SpO2 96%  LMP 04/21/2013 Gen: adult female, alert/cooperative in NAD; patient is blind HEENT: left eye artificial; right pupil RRL, EOMI Pulm: RRR, systolic ejection murmur noted at baseline 3/6 Pulm: CTAB, normal WOB Abd: soft, nontender, BS+ Ext: mid thigh with mild tenderness in area of graft, no fluctuance or obvious cellulitic changes  Otherwise LE without edema or calf  pain  A/P: 32 y.o. year old female presenting presenting with fever and thigh pain on dialysis, with suspicion for infected HD graft (placed originally July 2014). PMH is significant for blindness, ESRD on HD, diabetes type 1, HTN, HLD, hypothyroidism. Pt also with hx of DVT.   #Fever/leg pain - likely secondary to acute infection, in/around graft; fevers and RLE pain improving on abx - nephrology and VVS consulted, appreciate recs; graft functioning well - leukocytosis resolved - continue vanc/Zosyn - NG cultures blood and urine >48 hours. Safe to d/c home on zanc/fortaz with HD which will be arranged by nephrology.   #Diabetes type 1 - last A1c 8.4 - continue home Lantus 12 units BID, sensitive SSI. D/c home on home regimen.   #HTN - at baseline, on amlodipine 10 mg daily; BP's here now elevated. Restart amlodipine at d/c.   #HLD, hypothyroidism - home Synthroid 75 mcg daily, home Lipitor  FEN/GI: renal diet, saline lock IV Prophylaxis: hx of DVT's, continue warfarin per pharmacy  Disposition: discharge home today  I have independently interviewed and examined this patient in conjunction with Mr. Marquita Palms, MS4, whose note is documented separately. The above note reflects my independent interview, exam, and assessment/plan.  Please see also attending notes.   Storm Frisk, MD PGY-3, [facility]  05/25/2013 11:48 AM FPTS Service Pager: [phone]

## 2013-05-25 NOTE — Procedures (Signed)
Patient was seen on dialysis and the procedure was supervised.  BFR 400  Via AVG BP is  153/80.   Patient appears to be tolerating treatment well  [name] A 05/25/2013

## 2013-05-25 NOTE — Progress Notes (Signed)
ANTICOAGULATION CONSULT NOTE - Follow Up Consult  Pharmacy Consult for Warfarin  Indication: DVT  No Known Allergies  Patient Measurements: Height: 5' 2.5" (158.8 cm) Weight: 145 lb 8.1 oz (66 kg) (standing) IBW/kg (Calculated) : 51.25  Vital Signs: Temp: 97.5 F (36.4 C) (08/20 0732) Temp src: Oral (08/20 0732) BP: 155/81 mmHg (08/20 0930) Pulse Rate: 72 (08/20 0930)  Labs:  Recent Labs  05/23/13 1231 05/24/13 0430 05/25/13 0756 05/25/13 0757  HGB 10.0* 9.2*  --  9.3*  HCT 30.3* 28.4*  --  28.5*  PLT 186 182  --  185  LABPROT 28.3* 35.0*  --  28.1*  INR 2.77* 3.66*  --  2.74*  CREATININE 4.33*  --  7.68*  --     Estimated Creatinine Clearance: 9.8 ml/min (by C-G formula based on Cr of 7.68).   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 INR back down today (2.74<3.66<2.77), Hgb 9.3 (from 9.2). No overt bleeding noted. ESRD on HD MWF. Re-start at lower dose tonight.  Goal of Therapy:  INR 2-3 Monitor platelets by anticoagulation protocol: Yes   Plan:  -Warfarin 4 mg x 1 tonight at 1800 -Daily PT/INR -Monitor for bleeding  Thank you for allowing me to take part in this patient's care,  Abran Duke, PharmD Clinical Pharmacist Phone: [phone] Pager: [phone] 05/25/2013 9:59 AM

## 2013-05-25 NOTE — Progress Notes (Signed)
PCP Progress Note  [name], admitted 8/18 for fever and AVG site discomfort, has been under my care since her previous discharge. Her medical history is complicated by T1DM with marginal medical compliance resulting in quiescent diabetic retinopathy, and recently ESRD requiring dialysis. Her hospital course has been reassuring for no signs of bacteremia and brisk response to antibiotics, with successful recannulization at hemodialysis earlier today. She says she feels much better with no AVG site pain and remains without fever, chills, leg swelling/tenderness, CP or SOB.   My exam demonstrated a non-toxic appearing 32 yo female in NAD with MMM, an unchanged 3/6 SEM without thrill, brisk capillary refill, clear lung exam, soft, NT abdomen, and very mild tenderness to palpation around AVG site on right thigh without induration or swelling.   I appreciate the expert medical management she has received during this hospitalization and will be glad to follow up with her on 9/5.   Stepfon Rawles B. Jarvis Newcomer, MD PGY-1, [facility] 05/25/2013 3:25 PM

## 2013-05-25 NOTE — Progress Notes (Signed)
[name] Progress Note  Subjective:   Seen on HD- no problems as of yet  Objective Filed Vitals:   05/25/13 0448 05/25/13 0732 05/25/13 0739 05/25/13 0800  BP: 132/69 141/78 150/70 142/79  Pulse: 75 73 71 74  Temp: 97.7 F (36.5 C) 97.5 F (36.4 C)    TempSrc: Oral Oral    Resp: 18 18 17    Height:      Weight:  66 kg (145 lb 8.1 oz)    SpO2: 97% 96%     Physical Exam General: NAD Heart: RRR 2/6 murmur LSB Lungs:no wheezes or rales Abdomen: soft NT Extremities: no sig edema  Dialysis Access: right thigh graft patent no tenderness or erythema noted Dialysis Orders: MWF [facility]  4 hours 65.5 kg 2K/2.25 Ca+ 400/AF1.5 R thigh AVG 3000u heparin  hectorol Epo 10,000   Assessment/Plan: 1. Fever/graft pain - BC x 2 pending drawn pre antibiotics at dialysis and also here after about 1 gm of Vanc; Fevers down. Cultures here at least negative to date. Venous duplex shows [name] perigraft complex fluid collection of fluid near art anastamosis and several inches along the graft. Not sure of significance.  Appreciate VVS opinion, is hoping it does not represent perigraft abscess.  Continue vanc and zosyn for now, if get thru treatment today without incident, is OK for discharge later and would cont vanc and fortaz for 2 weeks at OP HD (we will arrange) 2. ESRD - MWF via AVG - HD today 3. Anemia - Hgb 10 down to 9.3; Aranesp q Wed - no prior iron studies at her dialysis unit.  Last ferritin 109 in July tsat 31% in June; iron check here pending 4. Secondary hyperparathyroidism - hectorol with HD and fosrenol 1 gm ac 5. HTN/volume - volume control only at the moment; normally on norvasc 10; will resume norvasc q HS at discharge 6. Nutrition - high protein renal diet and multivitamin 7. 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 DM - per primary on statin 9. Hypothyroidism - on synthroid  [name]   05/25/2013,8:33 AM  LOS: 2 days         Additional Objective Labs: Basic Metabolic Panel:  Recent Labs Lab 05/23/13 1231  NA 136  K 3.5  CL 94*  CO2 26  GLUCOSE 298*  BUN 21  CREATININE 4.33*  CALCIUM 8.5   CBC:  Lab Results  Component Value Date   INR 3.66* 05/24/2013   INR 2.77* 05/23/2013   INR 1.7 05/20/2013    Blood Culture    Component Value Date/Time   SDES URINE, CLEAN CATCH 05/23/2013 1306   SPECREQUEST NONE 05/23/2013 1306   CULT  Value: NO GROWTH Performed at [facility] 05/23/2013 1306   REPTSTATUS 05/24/2013 FINAL 05/23/2013 1306  CBG:  Recent Labs Lab 05/23/13 2100 05/24/13 0723 05/24/13 1135 05/24/13 1619 05/24/13 2037  GLUCAP 492* 260* 341* 358* 342*  Medications:   . atorvastatin  40 mg Oral q1800  . darbepoetin (ARANESP) injection - DIALYSIS  100 mcg Intravenous Q Wed-HD  . doxercalciferol  1 mcg Intravenous Q M,W,F-HD  . insulin aspart  0-9 Units Subcutaneous TID WC  . insulin glargine  12 Units Subcutaneous BID  . lanthanum  1,000 mg Oral TID WC  . levothyroxine  75 mcg Oral QAC breakfast  . multivitamin  1 tablet Oral QHS  . piperacillin-tazobactam (ZOSYN)  IV  2.25 g Intravenous Q8H  . sodium chloride  3  mL Intravenous Q12H  . vancomycin  750 mg Intravenous Q M,W,F-HD  . Warfarin - Pharmacist Dosing Inpatient   Does not apply [phone]

## 2013-05-25 NOTE — Progress Notes (Signed)
FMTS Attending Admission Note: Susan Quinteros,MD I  have seen and examined this patient, reviewed their chart. I have discussed this patient with the resident. I agree with the resident's findings, assessment and care plan.  Patient denies any concern today,doing well,currently having dialysis treatment. Denies any fever,no pain from her AVG.  Filed Vitals:   05/25/13 1100 05/25/13 1130 05/25/13 1143 05/25/13 1215  BP: 147/77 154/82 157/81 146/71  Pulse: 75 74 72 76  Temp:   98.2 F (36.8 C) 98.7 F (37.1 C)  TempSrc:   Oral Oral  Resp:      Height:      Weight:   143 lb 4.8 oz (65 kg)   SpO2:   100% 99%    Exam: Gen: Awake and alert,calm in bed,not in distress. Resp: Air entry equal B/l Heart: S1 S2 no murmurs. Abd; benign: Ext: No edema, AVG location on right thigh with no surrounding erythema or tenderness.  A/P; Fever: resolved, likely secondary to infection around AVG.            AVG doppler U/S reviewed with no abscess collection.            Continue A/B regimen.            Tylenol prn fever.            Feels well enough to go home.  ESRD: On HD this morning.             Follow nephrology recommendation.  DM: Continue home regimen.

## 2013-05-25 NOTE — Discharge Summary (Signed)
FMTS Attending Admission Note: Susan Bubolz,MD I  have seen and examined this patient, reviewed their chart. I have discussed this patient with the resident. I agree with the resident's findings, assessment and care plan. Nephrology team agreed to manage her A/B regimen.

## 2013-05-27 ENCOUNTER — Ambulatory Visit: Payer: BC Managed Care – PPO

## 2013-05-29 LAB — CULTURE, BLOOD (ROUTINE X 2)

## 2013-05-31 ENCOUNTER — Ambulatory Visit: Payer: BC Managed Care – PPO

## 2013-06-01 ENCOUNTER — Telehealth: Payer: Self-pay | Admitting: *Deleted

## 2013-06-01 ENCOUNTER — Ambulatory Visit: Payer: 59

## 2013-06-01 NOTE — Telephone Encounter (Signed)
Patient had PT/INR checked at kidney center on 05/30/13.  Patient is taking Coumadin 7.5 mg daily.  Results--PT 23.4, INR 2.19.  Patient has appt today with our office for PT/INR and was initially due on 05/27/13, but rescheduled for today.  May not need repeat INR due to recent labs on 05/30/13.  Will discuss with Bonnie [place], [place]--patient was here in office, but left without seeing lab due to trransportation.  Fax any dosage changes to [phone].  Gaylene Brooks, RN  Spoke with kidney center--They will check PT/INR every Monday and fax results to our office for dosing or call our lab if critical value result.  Info given to Bonnie [place], [place].   Will route note to Dr. Derenda Mis order faxed to [facility]/Dr. Primitivo Gauze to check PT/INR once a week--[phone].  Gaylene Brooks, RN

## 2013-06-07 ENCOUNTER — Other Ambulatory Visit: Payer: Self-pay | Admitting: Family Medicine

## 2013-06-07 DIAGNOSIS — I82629 Acute embolism and thrombosis of deep veins of unspecified upper extremity: Secondary | ICD-10-CM

## 2013-06-07 NOTE — Progress Notes (Signed)
Prescription faxed to [facility]/Dr. Primitivo Gauze at [phone] to check PT/INR once a week on Mondays.

## 2013-06-10 ENCOUNTER — Encounter: Payer: Self-pay | Admitting: Family Medicine

## 2013-06-10 ENCOUNTER — Ambulatory Visit (INDEPENDENT_AMBULATORY_CARE_PROVIDER_SITE_OTHER): Payer: 59 | Admitting: Family Medicine

## 2013-06-10 ENCOUNTER — Ambulatory Visit (INDEPENDENT_AMBULATORY_CARE_PROVIDER_SITE_OTHER): Payer: 59 | Admitting: *Deleted

## 2013-06-10 VITALS — BP 136/73 | HR 89 | Temp 99.0°F | Ht 62.5 in | Wt 144.0 lb

## 2013-06-10 DIAGNOSIS — I82623 Acute embolism and thrombosis of deep veins of upper extremity, bilateral: Secondary | ICD-10-CM

## 2013-06-10 DIAGNOSIS — I82629 Acute embolism and thrombosis of deep veins of unspecified upper extremity: Secondary | ICD-10-CM

## 2013-06-10 DIAGNOSIS — E109 Type 1 diabetes mellitus without complications: Secondary | ICD-10-CM

## 2013-06-10 DIAGNOSIS — Z23 Encounter for immunization: Secondary | ICD-10-CM

## 2013-06-10 DIAGNOSIS — E039 Hypothyroidism, unspecified: Secondary | ICD-10-CM

## 2013-06-10 DIAGNOSIS — I10 Essential (primary) hypertension: Secondary | ICD-10-CM

## 2013-06-10 DIAGNOSIS — N186 End stage renal disease: Secondary | ICD-10-CM

## 2013-06-10 DIAGNOSIS — I82622 Acute embolism and thrombosis of deep veins of left upper extremity: Secondary | ICD-10-CM

## 2013-06-10 LAB — TSH: TSH: 6.993 u[IU]/mL — ABNORMAL HIGH (ref 0.350–4.500)

## 2013-06-10 LAB — POCT INR: INR: 2.5

## 2013-06-10 NOTE — Progress Notes (Signed)
  Subjective:    Patient ID: [name], female    DOB: 05/16/1981, 32 y.o.   MRN: [id number]  HPI  [name] is a 32 yo female here for hospital followup.  She was hospitalized in August for AVG infection without bacteremia, treated with appropriate antibiotics with a 2 week course outpatient which she received at dialysis MWF. She reports no recent fevers, chills, pain at AVG site, malaise, fatigue, chest pain, shortness of breath, nausea, vomiting, diarrhea, or leg cramps/tenderness/swelling.   She still does not check her sugars regularly, only when she feels low, and they have not been above 200. She has been adherent to her lantus/novolog regimen, and states she takes all other medications.   She reports cold intolerance, and dry skin/hair.     Review of Systems As above    Objective:   Physical Exam  Constitutional: She is oriented to person, place, and time. She appears well-developed and well-nourished. No distress.  HENT:  Head: Normocephalic and atraumatic.  Mouth/Throat: Oropharynx is clear and moist.  Several teeth missing.   Eyes: Conjunctivae are normal.  EOM intact, left eye prosthesis, right eye nearly total blindness.   Neck: Neck supple. No thyromegaly present.  Cardiovascular: Normal rate.   Murmur heard. II/VI Systolic murmur unchanged from previous.   Pulmonary/Chest: Effort normal and breath sounds normal.  Abdominal: Soft. Bowel sounds are normal. She exhibits no distension. There is no tenderness.  Musculoskeletal: Normal range of motion. She exhibits no edema.  Neurological: She is alert and oriented to person, place, and time.  Skin: Skin is warm and dry.  R thigh AVG without surrounding erythema/signs of infection  Psychiatric: She has a normal mood and affect. Her behavior is normal.      Assessment & Plan:  [name] is a 32 yo female here for hospital follow up for now resolved AV graft infection without bacteremia, with no  complaints.

## 2013-06-10 NOTE — Assessment & Plan Note (Signed)
Noted on venogram on hospitalization in July. Contraindication to UE graft/fistula. On 7.5 mg coumadin daily with INR checks weekly at HD. INR today is 2.5. Continue current regimen.

## 2013-06-10 NOTE — Assessment & Plan Note (Addendum)
Diagnosed 18 years ago, multiple complications due to noncompliance. Taking 12 U lantus BID, a continuation of inapatient regimen, and she reports compliance with this - so will not change, though 24U once per day is appropriate. Also consistently taking >10 units at mealtime of novolog. Refuses to take CBG regularly. Spoke about referral to endocrinologist today, and she says she will keep record of blood sugars and bring them to that first visit. Last A1c 8.4 on 6/28. Also on lipitor, though no LDL on record, so will check this today.

## 2013-06-10 NOTE — Patient Instructions (Signed)
It was great to visit with you again.   I would like you to see an endocrinologist to stabilize your diabetes.  Continue taking lantus and novolog as you've been taking them, and checking your sugars. It would be helpful if you recorded all of these readings and brought that record to your first visit with the endocrinologist.   We are checking a TSH to modify your synthroid dose. I will contact you with these results next week, and possibly change your prescription.   Continue having your INR checked at dialysis every Monday. Your INR today is 2.5, which is perfect.

## 2013-06-10 NOTE — Assessment & Plan Note (Signed)
S/p ablation for Graves' disease. Has no record of being treated before recent hospitalization. TSH 265 on June. Started on at that time. Will recheck TSH today.

## 2013-06-10 NOTE — Assessment & Plan Note (Addendum)
Reports compliance with norvasc 10mg . BP at goal today.

## 2013-06-10 NOTE — Assessment & Plan Note (Signed)
Vision waxes and wanes, but still effectively total blindness. Due for ophthalmology f/u in Nov.

## 2013-06-14 ENCOUNTER — Other Ambulatory Visit: Payer: Self-pay | Admitting: Family Medicine

## 2013-06-14 ENCOUNTER — Telehealth: Payer: Self-pay | Admitting: Family Medicine

## 2013-06-14 DIAGNOSIS — E039 Hypothyroidism, unspecified: Secondary | ICD-10-CM

## 2013-06-14 MED ORDER — LEVOTHYROXINE SODIUM 100 MCG PO TABS: 100.0000 ug | ORAL_TABLET | Freq: Every day | ORAL | Status: DC

## 2013-06-14 NOTE — Progress Notes (Signed)
Recent TSH greatly improved from previous, but remains elevated, and pt symptomatic (cold intolerance, skin/hair changes). Will increase synthroid from to 100 mcg daily.

## 2013-06-21 NOTE — Telephone Encounter (Signed)
This encounter is erroneous.

## 2013-07-21 ENCOUNTER — Telehealth: Payer: Self-pay | Admitting: Family Medicine

## 2013-07-21 NOTE — Telephone Encounter (Signed)
Per note from Dr. Jarvis Newcomer, a rx for pt to have PT[facility] checked [facility] was never received by them.  Please re-fax order so that she can get labs drawn.  Can fax to the [facility] @ [phone].  No particular person to address to.

## 2013-07-22 NOTE — Telephone Encounter (Signed)
I previously faxed this order a long time ago. I will refax this today.

## 2013-07-28 ENCOUNTER — Telehealth: Payer: Self-pay | Admitting: *Deleted

## 2013-07-28 NOTE — Telephone Encounter (Signed)
[facility] calling to report patient with critical INR of 4.1, spoke with preceptor advised to hold coumadin tonight and come in for appt tomorrow however patient states she will not have a ride until Monday. Patient she states she had'nt taken coumadin today, she will hold today's dose and take 5mg  tomorrow then have INR checked again at dialysis tomorrow per preceptor. Patient scheduled for lab visit on 10/27, will follow up with [facility] tomorrow. Will forward to PCP

## 2013-07-29 NOTE — Telephone Encounter (Signed)
Spoke with patient, as yet to receive INR results from kidney center, advised patient to call MD line over the weekend with results for further coumadin dosing. Patient agreeable to this.

## 2013-07-29 NOTE — Telephone Encounter (Signed)
Spoke with Gerarda Gunther at [facility].  Patient had PT/INR drawn today.  Will not get results until tomorrow.  Will page on-call [phone]) MD for [facility] if results are abnormal and patient needs dosing.  Otherwise, will fax results to our office on Monday.  Gaylene Brooks, RN

## 2013-08-04 NOTE — Telephone Encounter (Signed)
Received fax with INR 1.35 collected 10/27. A 3 day swing from 4.1 to 1.35 is dramatic and I suspect with multiple previous INRs in range these represent dietary changes and perhaps some over correction from the missed dose followed by 5mg  dose.   I called her and let her know her INR is 1.35 and she should keep taking 7.5mg  daily 6 days per week and 5mg  on Sundays.

## 2013-08-15 ENCOUNTER — Other Ambulatory Visit: Payer: Self-pay | Admitting: Family Medicine

## 2013-08-15 DIAGNOSIS — I82409 Acute embolism and thrombosis of unspecified deep veins of unspecified lower extremity: Secondary | ICD-10-CM

## 2013-08-15 DIAGNOSIS — E039 Hypothyroidism, unspecified: Secondary | ICD-10-CM

## 2013-08-15 DIAGNOSIS — E109 Type 1 diabetes mellitus without complications: Secondary | ICD-10-CM

## 2013-08-15 MED ORDER — INSULIN ASPART 100 UNIT/ML ~~LOC~~ SOLN: 5.0000 [IU] | SUBCUTANEOUS | Status: DC

## 2013-08-15 MED ORDER — WARFARIN SODIUM 2.5 MG PO TABS: 7.5000 mg | ORAL_TABLET | Freq: Every day | ORAL | Status: DC

## 2013-08-15 MED ORDER — INSULIN GLARGINE 100 UNIT/ML ~~LOC~~ SOLN: 12.0000 [IU] | Freq: Two times a day (BID) | SUBCUTANEOUS | Status: DC

## 2013-08-15 MED ORDER — LEVOTHYROXINE SODIUM 100 MCG PO TABS: 100.0000 ug | ORAL_TABLET | Freq: Every day | ORAL | Status: DC

## 2013-09-09 ENCOUNTER — Ambulatory Visit (INDEPENDENT_AMBULATORY_CARE_PROVIDER_SITE_OTHER): Payer: Medicare Other | Admitting: Family Medicine

## 2013-09-09 ENCOUNTER — Encounter: Payer: Self-pay | Admitting: Family Medicine

## 2013-09-09 VITALS — BP 146/79 | HR 104 | Temp 99.6°F | Ht 62.5 in | Wt 143.0 lb

## 2013-09-09 DIAGNOSIS — I82629 Acute embolism and thrombosis of deep veins of unspecified upper extremity: Secondary | ICD-10-CM

## 2013-09-09 DIAGNOSIS — E109 Type 1 diabetes mellitus without complications: Secondary | ICD-10-CM

## 2013-09-09 DIAGNOSIS — E039 Hypothyroidism, unspecified: Secondary | ICD-10-CM

## 2013-09-09 DIAGNOSIS — I82623 Acute embolism and thrombosis of deep veins of upper extremity, bilateral: Secondary | ICD-10-CM

## 2013-09-09 DIAGNOSIS — I10 Essential (primary) hypertension: Secondary | ICD-10-CM

## 2013-09-09 NOTE — Progress Notes (Signed)
Patient ID: [name], female   DOB: 1981-08-23, 32 y.o.   MRN: [id number] Subjective:  HPI:   [name] is a 32 y.o. female with a history of T1DM, ESRD on HD, upper extremity DVTs, and hypothyroidism here for followup of chronic medical conditions.   [name] reports taking lantus 12u in AM and 10u at bedtime  inconsistently due to problems remembering to take her medications and problems with mail order pharmacy dispensing lantus. She checks CBGs once every 2-3 days on average, usually at a random time during the day or if she feels hypoglycemic. Hypoglycemia symptoms include red spots in her vision and feeling weak. This occurred yesterday and CBG was 60. She took juice and a sandwich to avert further problems.   She stopped taking warfarin for the past week because it made her nauseated. She had tried to take it following her meals instead of before, but now she says she is taking it before meals without difficulty.   She has no acute complaints at this time. Denies fever, chills, CP, SOB, changes of vision, N/V/D/C, dysuria, urinary frequency, vaginal discharge or bleeding.   Review of Systems:  Per HPI. All other systems reviewed and are negative.    Objective:  Physical Exam: BP 146/79  Pulse 104  Temp(Src) 99.6 F (37.6 C) (Oral)  Ht 5' 2.5" (1.588 m)  Wt 143 lb (64.864 kg)  BMI 25.72 kg/m2  Gen: 32 y.o. female in NAD HEENT: MMM, EOMI, L eye prosthesis in place, R eye PERRL with limited acuity, muddy sclerae without icterus or hemorrhage CV: RRR, II/VI SEM unchanged Resp: Non-labored, CTAB, no wheezes noted Abd: Soft, NTND, BS present, no guarding or organomegaly MSK: No edema noted, full ROM Neuro: Alert and oriented, speech normal    Assessment:     [name] is a 32 y.o. female here for follow up of chronic medical conditions    Plan:     See problem list for problem-specific plans.  Due for Pap smear: will perform at next appointment in 3  months.

## 2013-09-09 NOTE — Assessment & Plan Note (Signed)
Primary DVT noted in July 2014. Expected tx 3-6 months per ACCP guidelines. Non-compliant with coumadin tx. Extensively counseled concerning importance of this tx in decreasing morbidity and mortality. Will continue 7.5mg  daily for 1 more month. INR checks qMonday at HD.

## 2013-09-09 NOTE — Assessment & Plan Note (Signed)
Did not follow up with endocrinologist. Self-managing lantus doses based on subjective feelings and refuses to reliably check CBG. Reports some hypoglycemic episodes. Hb A1c improved to 6.8 today. Explained t1/2 of lantus and that 22u qAM would be appropriate. Will continue lantus 12u AM and 10u PM per pt preference.

## 2013-09-09 NOTE — Assessment & Plan Note (Signed)
TSH 6.993 on 9/5, synthroid increased > at that time. Attempted to recheck today, but pt left office early without labwork. Will check at next office visit.

## 2013-09-26 ENCOUNTER — Telehealth: Payer: Self-pay | Admitting: Family Medicine

## 2013-09-26 NOTE — Telephone Encounter (Signed)
Received fax, signed and put in "To Scan" folder, reporting abnormal INR today. INR: 3.47. I called [name] and she reports no abnormalities in medication administration, no NSAIDS, etc., no changes in diet. No signs or symptoms of bleeding, bruising, poor healing. Advised her to NOT take tomorrow dose of coumadin, and resume normal doses thereafter. Will follow up on next INR to determine if permanent regimen change needs to be made.

## 2013-10-27 ENCOUNTER — Ambulatory Visit: Payer: 59 | Admitting: Vascular Surgery

## 2013-11-01 ENCOUNTER — Encounter: Payer: Self-pay | Admitting: Vascular Surgery

## 2013-11-02 ENCOUNTER — Encounter (HOSPITAL_COMMUNITY): Payer: Self-pay | Admitting: Pharmacy Technician

## 2013-11-02 ENCOUNTER — Encounter: Payer: Self-pay | Admitting: Vascular Surgery

## 2013-11-02 ENCOUNTER — Ambulatory Visit (INDEPENDENT_AMBULATORY_CARE_PROVIDER_SITE_OTHER): Payer: Medicare Other | Admitting: Vascular Surgery

## 2013-11-02 ENCOUNTER — Other Ambulatory Visit: Payer: Self-pay | Admitting: *Deleted

## 2013-11-02 VITALS — BP 152/84 | HR 97 | Ht 62.5 in | Wt 163.0 lb

## 2013-11-02 DIAGNOSIS — N186 End stage renal disease: Secondary | ICD-10-CM

## 2013-11-02 NOTE — Assessment & Plan Note (Signed)
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 I would recommend revision of the arterial half of her graft. We will likely have to bypass around this area. We will need to hold her Coumadin 4 days prior to the procedure. She is on Coumadin because of a history of an upper extremity DVT. Her surgery has been scheduled for 11/11/2013. This can be done as an outpatient.

## 2013-11-02 NOTE — Progress Notes (Signed)
[facility] [place]  Patient name: [name] MRN: [id number] DOB: 11/18/1980 Sex: female  REASON FOR VISIT: Evaluate for revision of right thigh AV graft  HPI: [name] is a 33 y.o. female who is legally blind. She dialyzes on Tuesdays Thursdays and Saturdays. She has a right thigh AV graft. She had a fistulogram performed by Dr. Juel BurrowLin on 10/21/2013. He noted a small pseudoaneurysm over the arterial half of the graft within the skin overlying the pseudoaneurysm. She was referred for evaluation of a possible revision of her right thigh graft.  She denies any recent uremic symptoms. Specifically she denies nausea, vomiting, fatigue, palpitations, or anorexia.   Past Medical History  Diagnosis Date  . 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 Hypothyroidism   . Hypertension   . Type I diabetes mellitus   . DVT (deep venous thrombosis)     "both arms" (05/23/2013)  . End-stage renal disease on hemodialysis     "MWF; [facility]" (05/23/2013)  . High cholesterol   . Heart murmur     SEM  . Diabetic retinopathy associated with type 1 diabetes mellitus   . Graves' disease     "had thyroid radioactively treated" (05/23/2013)   Family History  Problem Relation Age of Onset  . Hypertension    . Cancer    . Thyroid disease     SOCIAL HISTORY: History  Substance Use Topics  . Smoking status: Never Smoker   . Smokeless tobacco: Never Used  . Alcohol Use: No   No Known Allergies Current Outpatient Prescriptions  Medication Sig Dispense Refill  . amLODipine (NORVASC) 10 MG tablet Take 1 tablet (10 mg total) by mouth at bedtime.  90 tablet  3  . atorvastatin (LIPITOR) 40 MG tablet Take 1 tablet (40 mg total) by mouth daily.  90 tablet  3  . insulin aspart (NOVOLOG) 100 UNIT/ML injection Inject 5-10 Units into the skin See admin instructions. Titration of additional 1 unit per every 50 spike in blood sugar level max of 200  1 vial  11  .  insulin glargine (LANTUS) 100 UNIT/ML injection Inject 0.12 mLs (12 Units total) into the skin 2 (two) times daily.  10 mL  11  . lanthanum (FOSRENOL) 1000 MG chewable tablet Chew 1,000 mg by mouth 3 (three) times daily with meals.      Marland Kitchen. levothyroxine (SYNTHROID, LEVOTHROID) 100 MCG tablet Take 1 tablet (100 mcg total) by mouth daily.  90 tablet  11  . multivitamin (RENA-VIT) TABS tablet Take 1 tablet by mouth daily.      Marland Kitchen. warfarin (COUMADIN) 2.5 MG tablet Take 3 tablets (7.5 mg total) by mouth daily.  90 tablet  11  . cefTAZidime (FORTAZ) 1 G injection To be administered and dosed M, W, F per nephrology with dialysis per nephrology  1 each    . glucose blood test strip Use as instructed  100 each  12  . Vancomycin (VANCOCIN) 750 MG/150ML SOLN Inject 150 mLs (750 mg total) into the vein every Monday, Wednesday, and Friday with hemodialysis.       No current facility-administered medications for this visit.   REVIEW OF SYSTEMS: Arly.Keller[X ] denotes positive finding; [  ] denotes negative finding  CARDIOVASCULAR:  [ ]  chest pain   [ ]  chest pressure   [ ]  palpitations   [ ]  orthopnea   [ ]  dyspnea on exertion   [ ]   claudication   [ ]  rest pain   [ ]  DVT   [ ]  phlebitis PULMONARY:   [ ]  productive cough   [ ]  asthma   [ ]  wheezing NEUROLOGIC:   [ ]  weakness  [ ]  paresthesias  [ ]  aphasia  [ ]  amaurosis  [ ]  dizziness HEMATOLOGIC:   [ ]  bleeding problems   [ ]  clotting disorders MUSCULOSKELETAL:  [ ]  joint pain   [ ]  joint swelling [ ]  leg swelling GASTROINTESTINAL: [ ]   blood in stool  [ ]   hematemesis GENITOURINARY:  [ ]   dysuria  [ ]   hematuria PSYCHIATRIC:  [ ]  history of major depression INTEGUMENTARY:  [ ]  rashes  [ ]  ulcers CONSTITUTIONAL:  [ ]  fever   [ ]  chills  PHYSICAL EXAM: Filed Vitals:   11/02/13 1617  BP: 152/84  Pulse: 97  Height: 5' 2.5" (1.588 m)  Weight: 163 lb (73.936 kg)  SpO2: 100%   Body mass index is 29.32 kg/(m^2). GENERAL: The patient is a well-nourished female, in  no acute distress. The vital signs are documented above. CARDIOVASCULAR: There is a regular rate and rhythm.  PULMONARY: There is good air exchange bilaterally without wheezing or rales. Her right thigh AV graft has an excellent bruit and thrill. 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 There is no eschar but the skin overlying this aneurysm is somewhat thin. SKIN: There are no ulcers or rashes noted. PSYCHIATRIC: The patient has a normal affect.  DATA:  I have reviewed her fistulogram.  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 I would recommend revision of the arterial half of her graft. We will likely have to bypass around this area. We will need to hold her Coumadin 4 days prior to the procedure. She is on Coumadin because of a history of an upper extremity DVT. Her surgery has been scheduled for 11/11/2013. This can be done as an outpatient.   DICKSON,CHRISTOPHER S [facility] [place] Beeper: [phone]

## 2013-11-04 ENCOUNTER — Telehealth: Payer: Self-pay | Admitting: Family Medicine

## 2013-11-04 NOTE — Telephone Encounter (Signed)
   After hours line  Received call [facility] dialysis center that her INR is 5.08.   I called the patient and discussed this with her, she does not have any signs of bleeding and has not fallen today. She states she takes 7.5 mg of warfarin every day. She also states that she scheduled for surgery next Friday and was told to stop warfarin before Monday anyway.  I recommended that she hold her warfarin tomorrow and Sunday and call the clinic and get her INR checked on Monday. I also discussed with her red flags to seek medical care which would mean any uncontrolled bleeding or head trauma.  She voiced understanding of the plan and agrees to come get checked Monday.  Murtis SinkSam Jameela Michna, MD [facility] Resident, PGY-2 11/04/2013, 5:22 PM

## 2013-11-09 ENCOUNTER — Encounter (HOSPITAL_COMMUNITY): Payer: Self-pay | Admitting: *Deleted

## 2013-11-10 MED ORDER — DEXTROSE 5 % IV SOLN
1.5000 g | INTRAVENOUS | Status: AC
  Administered 2013-11-11: 1.5 g via INTRAVENOUS
  Filled 2013-11-10: qty 1.5

## 2013-11-11 ENCOUNTER — Inpatient Hospital Stay (HOSPITAL_COMMUNITY): Payer: 59

## 2013-11-11 ENCOUNTER — Observation Stay (HOSPITAL_COMMUNITY)
Admission: RE | Admit: 2013-11-11 | Discharge: 2013-11-12 | Disposition: A | Discharge status: Home / Self Care | Payer: 59 | Source: Ambulatory Visit | Attending: Vascular Surgery | Admitting: Vascular Surgery

## 2013-11-11 ENCOUNTER — Encounter (HOSPITAL_COMMUNITY)
Admission: RE | Disposition: A | Discharge status: Home / Self Care | Payer: Medicare Other | Source: Ambulatory Visit | Attending: Vascular Surgery

## 2013-11-11 ENCOUNTER — Inpatient Hospital Stay (HOSPITAL_COMMUNITY): Payer: 59 | Admitting: Certified Registered Nurse Anesthetist

## 2013-11-11 ENCOUNTER — Encounter (HOSPITAL_COMMUNITY): Payer: Self-pay | Admitting: *Deleted

## 2013-11-11 ENCOUNTER — Encounter (HOSPITAL_COMMUNITY): Payer: 59 | Admitting: Certified Registered Nurse Anesthetist

## 2013-11-11 DIAGNOSIS — H548 Legal blindness, as defined in USA: Secondary | ICD-10-CM | POA: Insufficient documentation

## 2013-11-11 DIAGNOSIS — I120 Hypertensive chronic kidney disease with stage 5 chronic kidney disease or end stage renal disease: Secondary | ICD-10-CM | POA: Insufficient documentation

## 2013-11-11 DIAGNOSIS — Y832 Surgical operation with anastomosis, bypass or graft as the cause of abnormal reaction of the patient, or of later complication, without mention of misadventure at the time of the procedure: Secondary | ICD-10-CM | POA: Insufficient documentation

## 2013-11-11 DIAGNOSIS — I724 Aneurysm of artery of lower extremity: Secondary | ICD-10-CM | POA: Insufficient documentation

## 2013-11-11 DIAGNOSIS — Z794 Long term (current) use of insulin: Secondary | ICD-10-CM | POA: Insufficient documentation

## 2013-11-11 DIAGNOSIS — N186 End stage renal disease: Secondary | ICD-10-CM | POA: Diagnosis present

## 2013-11-11 DIAGNOSIS — E039 Hypothyroidism, unspecified: Secondary | ICD-10-CM | POA: Insufficient documentation

## 2013-11-11 DIAGNOSIS — T82898A Other specified complication of vascular prosthetic devices, implants and grafts, initial encounter: Secondary | ICD-10-CM

## 2013-11-11 DIAGNOSIS — E1039 Type 1 diabetes mellitus with other diabetic ophthalmic complication: Secondary | ICD-10-CM | POA: Insufficient documentation

## 2013-11-11 DIAGNOSIS — E0500 Thyrotoxicosis with diffuse goiter without thyrotoxic crisis or storm: Secondary | ICD-10-CM | POA: Insufficient documentation

## 2013-11-11 DIAGNOSIS — Z7901 Long term (current) use of anticoagulants: Secondary | ICD-10-CM | POA: Insufficient documentation

## 2013-11-11 DIAGNOSIS — I739 Peripheral vascular disease, unspecified: Secondary | ICD-10-CM | POA: Insufficient documentation

## 2013-11-11 DIAGNOSIS — E11319 Type 2 diabetes mellitus with unspecified diabetic retinopathy without macular edema: Secondary | ICD-10-CM | POA: Insufficient documentation

## 2013-11-11 DIAGNOSIS — Z992 Dependence on renal dialysis: Secondary | ICD-10-CM | POA: Insufficient documentation

## 2013-11-11 DIAGNOSIS — Z86718 Personal history of other venous thrombosis and embolism: Secondary | ICD-10-CM | POA: Insufficient documentation

## 2013-11-11 HISTORY — PX: REVISION OF ARTERIOVENOUS GORETEX GRAFT: SHX6073

## 2013-11-11 LAB — COMPREHENSIVE METABOLIC PANEL WITH GFR
ALT: 18 U/L (ref 0–35)
AST: 20 U/L (ref 0–37)
Albumin: 3.9 g/dL (ref 3.5–5.2)
Alkaline Phosphatase: 100 U/L (ref 39–117)
BUN: 49 mg/dL — ABNORMAL HIGH (ref 6–23)
CO2: 20 meq/L (ref 19–32)
Calcium: 9.3 mg/dL (ref 8.4–10.5)
Chloride: 93 meq/L — ABNORMAL LOW (ref 96–112)
Creatinine, Ser: 7.45 mg/dL — ABNORMAL HIGH (ref 0.50–1.10)
GFR calc Af Amer: 8 mL/min — ABNORMAL LOW
GFR calc non Af Amer: 7 mL/min — ABNORMAL LOW
Glucose, Bld: 204 mg/dL — ABNORMAL HIGH (ref 70–99)
Potassium: 4.2 meq/L (ref 3.7–5.3)
Sodium: 136 meq/L — ABNORMAL LOW (ref 137–147)
Total Bilirubin: 0.9 mg/dL (ref 0.3–1.2)
Total Protein: 8.6 g/dL — ABNORMAL HIGH (ref 6.0–8.3)

## 2013-11-11 LAB — GLUCOSE, CAPILLARY
Glucose-Capillary: 156 mg/dL — ABNORMAL HIGH (ref 70–99)
Glucose-Capillary: 177 mg/dL — ABNORMAL HIGH (ref 70–99)
Glucose-Capillary: 163 mg/dL — ABNORMAL HIGH (ref 70–99)
Glucose-Capillary: 167 mg/dL — ABNORMAL HIGH (ref 70–99)
Glucose-Capillary: 485 mg/dL — ABNORMAL HIGH (ref 70–99)

## 2013-11-11 LAB — CBC
HCT: 44.1 % (ref 36.0–46.0)
HEMOGLOBIN: 14.4 g/dL (ref 12.0–15.0)
MCH: 28.2 pg (ref 26.0–34.0)
MCHC: 32.7 g/dL (ref 30.0–36.0)
MCV: 86.5 fL (ref 78.0–100.0)
Platelets: 145 10*3/uL — ABNORMAL LOW (ref 150–400)
RBC: 5.1 MIL/uL (ref 3.87–5.11)
RDW: 14.6 % (ref 11.5–15.5)
WBC: 7.3 10*3/uL (ref 4.0–10.5)

## 2013-11-11 LAB — POCT I-STAT 4, (NA,K, GLUC, HGB,HCT)
GLUCOSE: 180 mg/dL — AB (ref 70–99)
HEMATOCRIT: 46 % (ref 36.0–46.0)
Hemoglobin: 15.6 g/dL — ABNORMAL HIGH (ref 12.0–15.0)
Potassium: 4.7 mEq/L (ref 3.7–5.3)
SODIUM: 133 meq/L — AB (ref 137–147)

## 2013-11-11 LAB — PROTIME-INR
INR: 1.04 (ref 0.00–1.49)
INR: 1.09 (ref 0.00–1.49)
Prothrombin Time: 13.4 seconds (ref 11.6–15.2)
Prothrombin Time: 13.9 s (ref 11.6–15.2)

## 2013-11-11 LAB — APTT: aPTT: 40 seconds — ABNORMAL HIGH (ref 24–37)

## 2013-11-11 LAB — HCG, SERUM, QUALITATIVE: Preg, Serum: NEGATIVE

## 2013-11-11 SURGERY — REVISION OF ARTERIOVENOUS GORETEX GRAFT
Anesthesia: General | Site: Leg Upper | Laterality: Right

## 2013-11-11 MED ORDER — ATORVASTATIN CALCIUM 40 MG PO TABS
40.0000 mg | ORAL_TABLET | Freq: Every day | ORAL | Status: DC
  Administered 2013-11-11: 40 mg via ORAL
  Filled 2013-11-11 (×2): qty 1

## 2013-11-11 MED ORDER — PHENYLEPHRINE HCL 10 MG/ML IJ SOLN
INTRAMUSCULAR | Status: DC | PRN
  Administered 2013-11-11 (×4): 80 ug via INTRAVENOUS
  Administered 2013-11-11: 40 ug via INTRAVENOUS
  Administered 2013-11-11: 120 ug via INTRAVENOUS
  Administered 2013-11-11: 80 ug via INTRAVENOUS

## 2013-11-11 MED ORDER — RENA-VITE PO TABS
1.0000 | ORAL_TABLET | Freq: Every day | ORAL | Status: DC
  Administered 2013-11-11: 1 via ORAL
  Filled 2013-11-11 (×2): qty 1

## 2013-11-11 MED ORDER — WARFARIN SODIUM 7.5 MG PO TABS
7.5000 mg | ORAL_TABLET | Freq: Every day | ORAL | Status: DC
  Filled 2013-11-11: qty 1

## 2013-11-11 MED ORDER — ONDANSETRON HCL 4 MG/2ML IJ SOLN: 4.0000 mg | Freq: Four times a day (QID) | INTRAMUSCULAR | Status: DC | PRN

## 2013-11-11 MED ORDER — LANTHANUM CARBONATE 500 MG PO CHEW
1000.0000 mg | CHEWABLE_TABLET | Freq: Three times a day (TID) | ORAL | Status: DC
  Administered 2013-11-11: 1000 mg via ORAL
  Filled 2013-11-11 (×5): qty 2

## 2013-11-11 MED ORDER — HEPARIN SODIUM (PORCINE) 1000 UNIT/ML IJ SOLN
INTRAMUSCULAR | Status: AC
  Filled 2013-11-11: qty 1

## 2013-11-11 MED ORDER — LEVOTHYROXINE SODIUM 100 MCG PO TABS
100.0000 ug | ORAL_TABLET | Freq: Every day | ORAL | Status: DC
  Filled 2013-11-11 (×2): qty 1

## 2013-11-11 MED ORDER — LIDOCAINE HCL (CARDIAC) 20 MG/ML IV SOLN
INTRAVENOUS | Status: DC | PRN
  Administered 2013-11-11: 40 mg via INTRAVENOUS

## 2013-11-11 MED ORDER — WARFARIN - PHYSICIAN DOSING INPATIENT: Freq: Every day | Status: DC

## 2013-11-11 MED ORDER — PROPOFOL 10 MG/ML IV BOLUS
INTRAVENOUS | Status: DC | PRN
  Administered 2013-11-11: 150 mg via INTRAVENOUS

## 2013-11-11 MED ORDER — PROPOFOL 10 MG/ML IV BOLUS
INTRAVENOUS | Status: AC
  Filled 2013-11-11: qty 20

## 2013-11-11 MED ORDER — INSULIN GLARGINE 100 UNIT/ML ~~LOC~~ SOLN
12.0000 [IU] | Freq: Two times a day (BID) | SUBCUTANEOUS | Status: DC
  Administered 2013-11-11 (×2): 12 [IU] via SUBCUTANEOUS
  Filled 2013-11-11 (×3): qty 0.12

## 2013-11-11 MED ORDER — POTASSIUM CHLORIDE CRYS ER 20 MEQ PO TBCR
20.0000 meq | EXTENDED_RELEASE_TABLET | Freq: Once | ORAL | Status: AC
  Administered 2013-11-11: 20 meq via ORAL

## 2013-11-11 MED ORDER — AMLODIPINE BESYLATE 10 MG PO TABS
10.0000 mg | ORAL_TABLET | Freq: Every day | ORAL | Status: DC
  Administered 2013-11-11: 10 mg via ORAL
  Filled 2013-11-11 (×2): qty 1

## 2013-11-11 MED ORDER — SODIUM CHLORIDE 0.9 % IV SOLN: INTRAVENOUS | Status: DC

## 2013-11-11 MED ORDER — SODIUM CHLORIDE 0.9 % IV SOLN
INTRAVENOUS | Status: DC
  Administered 2013-11-11 (×2): via INTRAVENOUS

## 2013-11-11 MED ORDER — OXYCODONE HCL 5 MG/5ML PO SOLN: 5.0000 mg | Freq: Once | ORAL | Status: DC | PRN

## 2013-11-11 MED ORDER — ARTIFICIAL TEARS OP OINT
TOPICAL_OINTMENT | OPHTHALMIC | Status: DC | PRN
  Administered 2013-11-11: 1 via OPHTHALMIC

## 2013-11-11 MED ORDER — HYDRALAZINE HCL 20 MG/ML IJ SOLN: 10.0000 mg | INTRAMUSCULAR | Status: DC | PRN

## 2013-11-11 MED ORDER — PHENYLEPHRINE 40 MCG/ML (10ML) SYRINGE FOR IV PUSH (FOR BLOOD PRESSURE SUPPORT)
PREFILLED_SYRINGE | INTRAVENOUS | Status: AC
  Filled 2013-11-11: qty 10

## 2013-11-11 MED ORDER — FENTANYL CITRATE 0.05 MG/ML IJ SOLN
INTRAMUSCULAR | Status: AC
  Filled 2013-11-11: qty 5

## 2013-11-11 MED ORDER — OXYCODONE-ACETAMINOPHEN 5-325 MG PO TABS: 1.0000 | ORAL_TABLET | ORAL | Status: DC | PRN

## 2013-11-11 MED ORDER — PROTAMINE SULFATE 10 MG/ML IV SOLN
INTRAVENOUS | Status: DC | PRN
  Administered 2013-11-11: 20 mg via INTRAVENOUS

## 2013-11-11 MED ORDER — 0.9 % SODIUM CHLORIDE (POUR BTL) OPTIME
TOPICAL | Status: DC | PRN
  Administered 2013-11-11: 1000 mL

## 2013-11-11 MED ORDER — ARTIFICIAL TEARS OP OINT
TOPICAL_OINTMENT | OPHTHALMIC | Status: AC
  Filled 2013-11-11: qty 3.5

## 2013-11-11 MED ORDER — FENTANYL CITRATE 0.05 MG/ML IJ SOLN: 25.0000 ug | INTRAMUSCULAR | Status: DC | PRN

## 2013-11-11 MED ORDER — WARFARIN SODIUM 5 MG PO TABS
5.0000 mg | ORAL_TABLET | Freq: Every day | ORAL | Status: DC
  Administered 2013-11-11: 5 mg via ORAL
  Filled 2013-11-11 (×3): qty 1

## 2013-11-11 MED ORDER — ONDANSETRON HCL 4 MG/2ML IJ SOLN
INTRAMUSCULAR | Status: AC
  Filled 2013-11-11: qty 2

## 2013-11-11 MED ORDER — PHENOL 1.4 % MT LIQD
1.0000 | OROMUCOSAL | Status: DC | PRN
  Filled 2013-11-11: qty 177

## 2013-11-11 MED ORDER — METOPROLOL TARTRATE 1 MG/ML IV SOLN: 2.0000 mg | INTRAVENOUS | Status: DC | PRN

## 2013-11-11 MED ORDER — OXYCODONE HCL 5 MG PO TABS: 5.0000 mg | ORAL_TABLET | Freq: Once | ORAL | Status: DC | PRN

## 2013-11-11 MED ORDER — GUAIFENESIN-DM 100-10 MG/5ML PO SYRP: 15.0000 mL | ORAL_SOLUTION | ORAL | Status: DC | PRN

## 2013-11-11 MED ORDER — ALUM & MAG HYDROXIDE-SIMETH 200-200-20 MG/5ML PO SUSP: 15.0000 mL | ORAL | Status: DC | PRN

## 2013-11-11 MED ORDER — MEPERIDINE HCL 25 MG/ML IJ SOLN: 6.2500 mg | INTRAMUSCULAR | Status: DC | PRN

## 2013-11-11 MED ORDER — ONDANSETRON HCL 4 MG/2ML IJ SOLN
INTRAMUSCULAR | Status: DC | PRN
  Administered 2013-11-11: 4 mg via INTRAVENOUS

## 2013-11-11 MED ORDER — PANTOPRAZOLE SODIUM 40 MG PO TBEC
40.0000 mg | DELAYED_RELEASE_TABLET | Freq: Every day | ORAL | Status: DC
  Filled 2013-11-11: qty 1

## 2013-11-11 MED ORDER — MIDAZOLAM HCL 5 MG/5ML IJ SOLN
INTRAMUSCULAR | Status: DC | PRN
  Administered 2013-11-11: 2 mg via INTRAVENOUS

## 2013-11-11 MED ORDER — ENOXAPARIN SODIUM 30 MG/0.3ML ~~LOC~~ SOLN
30.0000 mg | SUBCUTANEOUS | Status: DC
  Administered 2013-11-11: 30 mg via SUBCUTANEOUS
  Filled 2013-11-11 (×2): qty 0.3

## 2013-11-11 MED ORDER — PROTAMINE SULFATE 10 MG/ML IV SOLN
INTRAVENOUS | Status: AC
  Filled 2013-11-11: qty 5

## 2013-11-11 MED ORDER — PROMETHAZINE HCL 25 MG/ML IJ SOLN: 6.2500 mg | INTRAMUSCULAR | Status: DC | PRN

## 2013-11-11 MED ORDER — LABETALOL HCL 5 MG/ML IV SOLN
10.0000 mg | INTRAVENOUS | Status: DC | PRN
  Filled 2013-11-11: qty 4

## 2013-11-11 MED ORDER — OXYCODONE HCL 5 MG PO TABS: 5.0000 mg | ORAL_TABLET | Freq: Four times a day (QID) | ORAL | Status: DC | PRN

## 2013-11-11 MED ORDER — GLYCOPYRROLATE 0.2 MG/ML IJ SOLN
INTRAMUSCULAR | Status: AC
  Filled 2013-11-11: qty 1

## 2013-11-11 MED ORDER — INSULIN ASPART 100 UNIT/ML ~~LOC~~ SOLN: 0.0000 [IU] | Freq: Three times a day (TID) | SUBCUTANEOUS | Status: DC

## 2013-11-11 MED ORDER — THROMBIN 20000 UNITS EX SOLR
CUTANEOUS | Status: AC
  Filled 2013-11-11: qty 20000

## 2013-11-11 MED ORDER — MIDAZOLAM HCL 2 MG/2ML IJ SOLN
INTRAMUSCULAR | Status: AC
  Filled 2013-11-11: qty 2

## 2013-11-11 MED ORDER — FENTANYL CITRATE 0.05 MG/ML IJ SOLN
INTRAMUSCULAR | Status: DC | PRN
  Administered 2013-11-11 (×2): 50 ug via INTRAVENOUS

## 2013-11-11 MED ORDER — INSULIN ASPART 100 UNIT/ML ~~LOC~~ SOLN
0.0000 [IU] | Freq: Three times a day (TID) | SUBCUTANEOUS | Status: DC
Start: 2013-11-11 — End: 2013-11-12
  Administered 2013-11-11: 15 [IU] via SUBCUTANEOUS

## 2013-11-11 MED ORDER — INSULIN ASPART 100 UNIT/ML ~~LOC~~ SOLN: 10.0000 [IU] | Freq: Three times a day (TID) | SUBCUTANEOUS | Status: DC

## 2013-11-11 MED ORDER — SODIUM CHLORIDE 0.9 % IR SOLN
Status: DC | PRN
  Administered 2013-11-11: 11:00:00

## 2013-11-11 MED ORDER — LIDOCAINE HCL (CARDIAC) 20 MG/ML IV SOLN
INTRAVENOUS | Status: AC
Start: 2013-11-11 — End: 2013-11-11
  Filled 2013-11-11: qty 5

## 2013-11-11 MED ORDER — HEPARIN SODIUM (PORCINE) 1000 UNIT/ML IJ SOLN
INTRAMUSCULAR | Status: DC | PRN
  Administered 2013-11-11: 4000 [IU] via INTRAVENOUS

## 2013-11-11 SURGICAL SUPPLY — 42 items
ADH SKN CLS APL DERMABOND .7 (GAUZE/BANDAGES/DRESSINGS) ×1
ADH SKN CLS LQ APL DERMABOND (GAUZE/BANDAGES/DRESSINGS) ×1
CANISTER SUCTION 2500CC (MISCELLANEOUS) ×3 IMPLANT
CATH EMB 4FR 80CM (CATHETERS) ×2 IMPLANT
CLIP TI MEDIUM 6 (CLIP) ×3 IMPLANT
CLIP TI WIDE RED SMALL 6 (CLIP) ×3 IMPLANT
COVER SURGICAL LIGHT HANDLE (MISCELLANEOUS) ×3 IMPLANT
DECANTER SPIKE VIAL GLASS SM (MISCELLANEOUS) ×3 IMPLANT
DERMABOND ADHESIVE PROPEN (GAUZE/BANDAGES/DRESSINGS) ×2
DERMABOND ADVANCED (GAUZE/BANDAGES/DRESSINGS) ×2
DERMABOND ADVANCED .7 DNX12 (GAUZE/BANDAGES/DRESSINGS) ×1 IMPLANT
DERMABOND ADVANCED .7 DNX6 (GAUZE/BANDAGES/DRESSINGS) IMPLANT
DRESSING OPSITE X SMALL 2X3 (GAUZE/BANDAGES/DRESSINGS) ×2 IMPLANT
ELECT REM PT RETURN 9FT ADLT (ELECTROSURGICAL) ×3
ELECTRODE REM PT RTRN 9FT ADLT (ELECTROSURGICAL) ×1 IMPLANT
GLOVE BIO SURGEON STRL SZ 6.5 (GLOVE) ×3 IMPLANT
GLOVE BIO SURGEON STRL SZ7 (GLOVE) ×2 IMPLANT
GLOVE BIO SURGEON STRL SZ7.5 (GLOVE) ×3 IMPLANT
GLOVE BIO SURGEONS STRL SZ 6.5 (GLOVE) ×3
GLOVE BIOGEL PI IND STRL 7.0 (GLOVE) IMPLANT
GLOVE BIOGEL PI IND STRL 8 (GLOVE) ×1 IMPLANT
GLOVE BIOGEL PI INDICATOR 7.0 (GLOVE) ×2
GLOVE BIOGEL PI INDICATOR 8 (GLOVE) ×2
GOWN STRL REUS W/ TWL LRG LVL3 (GOWN DISPOSABLE) ×3 IMPLANT
GOWN STRL REUS W/TWL LRG LVL3 (GOWN DISPOSABLE) ×9
GRAFT GORETEX STRT 4-7X45 (Vascular Products) ×2 IMPLANT
KIT BASIN OR (CUSTOM PROCEDURE TRAY) ×3 IMPLANT
KIT ROOM TURNOVER OR (KITS) ×3 IMPLANT
NDL HYPO 25GX1X1/2 BEV (NEEDLE) ×1 IMPLANT
NEEDLE HYPO 25GX1X1/2 BEV (NEEDLE) ×3 IMPLANT
NS IRRIG 1000ML POUR BTL (IV SOLUTION) ×3 IMPLANT
PACK CV ACCESS (CUSTOM PROCEDURE TRAY) ×3 IMPLANT
PAD ARMBOARD 7.5X6 YLW CONV (MISCELLANEOUS) ×6 IMPLANT
SPONGE SURGIFOAM ABS GEL 100 (HEMOSTASIS) IMPLANT
SUT PROLENE 6 0 BV (SUTURE) ×6 IMPLANT
SUT VIC AB 3-0 SH 27 (SUTURE) ×6
SUT VIC AB 3-0 SH 27X BRD (SUTURE) ×2 IMPLANT
SUT VICRYL 4-0 PS2 18IN ABS (SUTURE) ×6 IMPLANT
TOWEL OR 17X24 6PK STRL BLUE (TOWEL DISPOSABLE) ×3 IMPLANT
TOWEL OR 17X26 10 PK STRL BLUE (TOWEL DISPOSABLE) ×3 IMPLANT
UNDERPAD 30X30 INCONTINENT (UNDERPADS AND DIAPERS) ×3 IMPLANT
WATER STERILE IRR 1000ML POUR (IV SOLUTION) ×3 IMPLANT

## 2013-11-11 NOTE — Anesthesia Preprocedure Evaluation (Addendum)
Anesthesia Evaluation  Patient identified by MRN, date of birth, ID band Patient awake    Reviewed: Allergy & Precautions, H&P , NPO status , Patient's Chart, lab work & pertinent test results  History of Anesthesia Complications Negative for: history of anesthetic complications  Airway Mallampati: II TM Distance: >3 FB Neck ROM: Full    Dental  (+) Dental Advisory Given   Pulmonary neg pulmonary ROS,  breath sounds clear to auscultation  Pulmonary exam normal       Cardiovascular hypertension, Pt. on medications + Peripheral Vascular Disease and DVT Rhythm:Regular Rate:Normal  7/14 ECHO: EF 55-60%, grade 1 diastolic dysfunction, valves OK   Neuro/Psych negative neurological ROS     GI/Hepatic negative GI ROS, Neg liver ROS,   Endo/Other  diabetes (glu 177), Type 1Hypothyroidism   Renal/GU ESRF and DialysisRenal disease ([facility] dialysis, K+ 4.7)     Musculoskeletal   Abdominal   Peds  Hematology  (+) Blood dyscrasia (coumadin, INR 1.04), ,   Anesthesia Other Findings   Reproductive/Obstetrics 11/11/13 preg test NEG                        Anesthesia Physical Anesthesia Plan  ASA: III  Anesthesia Plan: General   Post-op Pain Management:    Induction: Intravenous  Airway Management Planned: LMA  Additional Equipment:   Intra-op Plan:   Post-operative Plan:   Informed Consent: I have reviewed the patients History and Physical, chart, labs and discussed the procedure including the risks, benefits and alternatives for the proposed anesthesia with the patient or authorized representative who has indicated his/her understanding and acceptance.   Dental advisory given  Plan Discussed with: CRNA and Surgeon  Anesthesia Plan Comments: (Plan routine monitors, GA- LMA OK)        Anesthesia Quick Evaluation

## 2013-11-11 NOTE — Op Note (Signed)
    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 Edilia Boickson, MD, FACS  ASSIST: Doreatha MassedSamantha Rhyne, PA  ANESTHESIA: Gen.   EBL: minimal   INDICATIONS: [name] is a 33 y.o. female who developed a small aneurysm with thinning of the overlying skin on the arterial half of the graft. She is brought in for revision of her graft.  FINDINGS: Patent right thigh AV graft.  TECHNIQUE: The patient was brought to the operating room and received a general anesthetic. The right thigh was prepped and draped in usual sterile fashion. An incision was made above and below the area of concern on the lateral aspect of the graft. At each incision the graft here was dissected free. A tunnel was created between the 2 incisions and this was lateral to the old graft. The patient was heparinized. A 7 mm graft was tunneled between the 2 incisions. The graft was cut to the appropriate length and sewn into and at the arterial limb of the graft with continuous 6-0 Prolene suture. A Fogarty catheter was then passed through the graft and through the arterial end of the graft and no clot was retrieved. The graft was flushed with heparinized saline and clamped. The graft was then cut to the appropriate length for anastomosis to the venous end of the graft. This was done with continuous 6-0 Prolene suture. Of note I also passed the catheter through the venous limb without any retrieval of clot. At the completion there was a good thrill in the graft. The heparin was partially reversed with protamine. Each of the incisions was closed with deep layer 3-0 Vicryl the skin closed with 4-0 Vicryl. Dermabond was applied. The patient tolerated the procedure well and was transferred to the recovery room in stable condition. All needle and sponge  counts were correct.  Waverly Ferrarihristopher Vanya Carberry, MD, FACS [facility]  DATE OF DICTATION:   11/11/2013

## 2013-11-11 NOTE — Preoperative (Signed)
Beta Blockers   Reason not to administer Beta Blockers:Not Applicable 

## 2013-11-11 NOTE — Interval H&P Note (Signed)
History and Physical Interval Note:  11/11/2013 10:24 AM  [name]  has presented today for surgery, with the diagnosis of ESRD  The various methods of treatment have been discussed with the patient and family. After consideration of risks, benefits and other options for treatment, the patient has consented to  Procedure(s): REVISION OF ARTERIOVENOUS GORETEX GRAFT - RIGHT THIGH (Right) as a surgical intervention .  The patient's history has been reviewed, patient examined, no change in status, stable for surgery.  I have reviewed the patient's chart and labs.  Questions were answered to the patient's satisfaction.     Annella Prowell S

## 2013-11-11 NOTE — Anesthesia Postprocedure Evaluation (Signed)
  Anesthesia Post-op Note  Patient: [name]  Procedure(s) Performed: Procedure(s): REVISION OF ARTERIOVENOUS GORETEX GRAFT - RIGHT THIGH (Right)  Patient Location: PACU  Anesthesia Type:General  Level of Consciousness: awake, alert , oriented and patient cooperative  Airway and Oxygen Therapy: Patient Spontanous Breathing  Post-op Pain: none  Post-op Assessment: Post-op Vital signs reviewed, Patient's Cardiovascular Status Stable, Respiratory Function Stable, Patent Airway and Pain level controlled, nausea improved  Post-op Vital Signs: Reviewed and stable  Complications: No apparent anesthesia complications

## 2013-11-11 NOTE — H&P (View-Only) (Signed)
[facility] [place]  Patient name: [name] Dietz MRN: [id number] DOB: 11/18/1980 Sex: female  REASON FOR VISIT: Evaluate for revision of right thigh AV graft  HPI: [name] Kertz is a 33 y.o. female who is legally blind. She dialyzes on Tuesdays Thursdays and Saturdays. She has a right thigh AV graft. She had a fistulogram performed by Dr. Juel BurrowLin on 10/21/2013. He noted a small pseudoaneurysm over the arterial half of the graft within the skin overlying the pseudoaneurysm. She was referred for evaluation of a possible revision of her right thigh graft.  She denies any recent uremic symptoms. Specifically she denies nausea, vomiting, fatigue, palpitations, or anorexia.   Past Medical History  Diagnosis Date  . 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 Hypothyroidism   . Hypertension   . Type I diabetes mellitus   . DVT (deep venous thrombosis)     "both arms" (05/23/2013)  . End-stage renal disease on hemodialysis     "MWF; [facility]" (05/23/2013)  . High cholesterol   . Heart murmur     SEM  . Diabetic retinopathy associated with type 1 diabetes mellitus   . Graves' disease     "had thyroid radioactively treated" (05/23/2013)   Family History  Problem Relation Age of Onset  . Hypertension    . Cancer    . Thyroid disease     SOCIAL HISTORY: History  Substance Use Topics  . Smoking status: Never Smoker   . Smokeless tobacco: Never Used  . Alcohol Use: No   No Known Allergies Current Outpatient Prescriptions  Medication Sig Dispense Refill  . amLODipine (NORVASC) 10 MG tablet Take 1 tablet (10 mg total) by mouth at bedtime.  90 tablet  3  . atorvastatin (LIPITOR) 40 MG tablet Take 1 tablet (40 mg total) by mouth daily.  90 tablet  3  . insulin aspart (NOVOLOG) 100 UNIT/ML injection Inject 5-10 Units into the skin See admin instructions. Titration of additional 1 unit per every 50 spike in blood sugar level max of 200  1 vial  11  .  insulin glargine (LANTUS) 100 UNIT/ML injection Inject 0.12 mLs (12 Units total) into the skin 2 (two) times daily.  10 mL  11  . lanthanum (FOSRENOL) 1000 MG chewable tablet Chew 1,000 mg by mouth 3 (three) times daily with meals.      Marland Kitchen. levothyroxine (SYNTHROID, LEVOTHROID) 100 MCG tablet Take 1 tablet (100 mcg total) by mouth daily.  90 tablet  11  . multivitamin (RENA-VIT) TABS tablet Take 1 tablet by mouth daily.      Marland Kitchen. warfarin (COUMADIN) 2.5 MG tablet Take 3 tablets (7.5 mg total) by mouth daily.  90 tablet  11  . cefTAZidime (FORTAZ) 1 G injection To be administered and dosed [name], W, F per nephrology with dialysis per nephrology  1 each    . glucose blood test strip Use as instructed  100 each  12  . Vancomycin (VANCOCIN) 750 MG/150ML SOLN Inject 150 mLs (750 mg total) into the vein every Monday, Wednesday, and Friday with hemodialysis.       No current facility-administered medications for this visit.   REVIEW OF SYSTEMS: Arly.Keller[X ] denotes positive finding; [  ] denotes negative finding  CARDIOVASCULAR:  [ ]  chest pain   [ ]  chest pressure   [ ]  palpitations   [ ]  orthopnea   [ ]  dyspnea on exertion   [ ]   claudication   [ ]  rest pain   [ ]  DVT   [ ]  phlebitis PULMONARY:   [ ]  productive cough   [ ]  asthma   [ ]  wheezing NEUROLOGIC:   [ ]  weakness  [ ]  paresthesias  [ ]  aphasia  [ ]  amaurosis  [ ]  dizziness HEMATOLOGIC:   [ ]  bleeding problems   [ ]  clotting disorders MUSCULOSKELETAL:  [ ]  joint pain   [ ]  joint swelling [ ]  leg swelling GASTROINTESTINAL: [ ]   blood in stool  [ ]   hematemesis GENITOURINARY:  [ ]   dysuria  [ ]   hematuria PSYCHIATRIC:  [ ]  history of major depression INTEGUMENTARY:  [ ]  rashes  [ ]  ulcers CONSTITUTIONAL:  [ ]  fever   [ ]  chills  PHYSICAL EXAM: Filed Vitals:   11/02/13 1617  BP: 152/84  Pulse: 97  Height: 5' 2.5" (1.588 [name])  Weight: 163 lb (73.936 kg)  SpO2: 100%   Body mass index is 29.32 kg/([name]^2). GENERAL: The patient is a well-nourished female, in  no acute distress. The vital signs are documented above. CARDIOVASCULAR: There is a regular rate and rhythm.  PULMONARY: There is good air exchange bilaterally without wheezing or rales. Her right thigh AV graft has an excellent bruit and thrill. 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 There is no eschar but the skin overlying this aneurysm is somewhat thin. SKIN: There are no ulcers or rashes noted. PSYCHIATRIC: The patient has a normal affect.  DATA:  I have reviewed her fistulogram.  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 I would recommend revision of the arterial half of her graft. We will likely have to bypass around this area. We will need to hold her Coumadin 4 days prior to the procedure. She is on Coumadin because of a history of an upper extremity DVT. Her surgery has been scheduled for 11/11/2013. This can be done as an outpatient.   Tawan Degroote S [facility] [place] Beeper: [phone]

## 2013-11-11 NOTE — Transfer of Care (Signed)
Immediate Anesthesia Transfer of Care Note  Patient: [name]  Procedure(s) Performed: Procedure(s): REVISION OF ARTERIOVENOUS GORETEX GRAFT - RIGHT THIGH (Right)  Patient Location: PACU  Anesthesia Type:General  Level of Consciousness: patient cooperative, lethargic and responds to stimulation  Airway & Oxygen Therapy: Patient Spontanous Breathing and Patient connected to nasal cannula oxygen  Post-op Assessment: Report given to PACU RN and Post -op Vital signs reviewed and stable  Post vital signs: Reviewed and stable  Complications: No apparent anesthesia complications

## 2013-11-11 NOTE — Discharge Instructions (Signed)
° ° °  11/11/2013 [name] [id number] 03/23/1981  Surgeon(s): Chuck Hinthristopher S Dickson, MD  Procedure(s): REVISION OF ARTERIOVENOUS GORETEX GRAFT - RIGHT THIGH      X May stick graft on designated area only:  DO NOT STICK GRAFT ON LATERAL ASPECT BETWEEN INCISIONS FOR 4 WEEKS.   SEE DIAGRAM.

## 2013-11-12 LAB — PROTIME-INR
INR: 1.02 (ref 0.00–1.49)
PROTHROMBIN TIME: 13.2 s (ref 11.6–15.2)

## 2013-11-12 NOTE — Progress Notes (Signed)
Pt is hemodynamically stable. Pt has been read discharge instructions. Pt will be leaving with a family member.

## 2013-11-14 ENCOUNTER — Encounter (HOSPITAL_COMMUNITY): Payer: Self-pay | Admitting: Vascular Surgery

## 2013-11-14 NOTE — Discharge Summary (Signed)
[facility] Discharge Summary  [name] 10/05/1981 33 y.o. female  [id number]  Admission Date: 11/11/2013  Discharge Date: 11/12/13  Physician: No att. providers found  Admission Diagnosis: ESRD   HPI:   This is a 33 y.o. female who is legally blind. She dialyzes on Tuesdays Thursdays and Saturdays. She has a right thigh AV graft. She had a fistulogram performed by Dr. Juel BurrowLin on 10/21/2013. He noted a small pseudoaneurysm over the arterial half of the graft within the skin overlying the pseudoaneurysm. She was referred for evaluation of a possible revision of her right thigh graft.  She denies any recent uremic symptoms. Specifically she denies nausea, vomiting, fatigue, palpitations, or anorexia.  Hospital Course:  The patient was admitted to the hospital and taken to the operating room on 11/11/2013 and underwent: revision of right thigh AV graft    The pt tolerated the procedure well and was transported to the PACU in good condition. She was admitted overnight for observation.  She was discharged the next morning so she could make her outpatient dialysis center appt.  The remainder of the hospital course consisted of increasing mobilization and increasing intake of solids without difficulty.  CBC    Component Value Date/Time   WBC 7.3 11/11/2013 1400   RBC 5.10 11/11/2013 1400   HGB 14.4 11/11/2013 1400   HCT 44.1 11/11/2013 1400   PLT 145* 11/11/2013 1400   MCV 86.5 11/11/2013 1400   MCH 28.2 11/11/2013 1400   MCHC 32.7 11/11/2013 1400   RDW 14.6 11/11/2013 1400   LYMPHSABS 0.6* 05/23/2013 1231   MONOABS 0.7 05/23/2013 1231   EOSABS 0.0 05/23/2013 1231   BASOSABS 0.0 05/23/2013 1231    BMET    Component Value Date/Time   NA 136* 11/11/2013 1400   K 4.2 11/11/2013 1400   CL 93* 11/11/2013 1400   CO2 20 11/11/2013 1400   GLUCOSE 204* 11/11/2013 1400   BUN 49* 11/11/2013 1400   CREATININE 7.45* 11/11/2013 1400   CALCIUM 9.3 11/11/2013 1400   CALCIUM 7.7* 04/04/2013 1745   GFRNONAA 7* 11/11/2013 1400   GFRAA 8* 11/11/2013 1400     Discharge Instructions:   The patient is discharged to home with extensive instructions on wound care and progressive ambulation.  They are instructed not to drive or perform any heavy lifting until returning to see the physician in his office.  Discharge Orders   Future Orders Complete By Expires   Call MD for:  redness, tenderness, or signs of infection (pain, swelling, bleeding, redness, odor or green/yellow discharge around incision site)  As directed    Call MD for:  severe or increased pain, loss or decreased feeling  in affected limb(s)  As directed    Call MD for:  temperature >100.5  As directed    Driving Restrictions  As directed    Comments:     No driving for 24 hours and while taking pain medication.   Lifting restrictions  As directed    Comments:     No lifting for 2 weeks   may wash over wound with mild soap and water  As directed    Resume previous diet  As directed       Discharge Diagnosis:  ESRD  Secondary Diagnosis: Patient Active Problem List   Diagnosis Date Noted  . ESRD (end stage renal disease) on dialysis 11/11/2013  . Unspecified hypothyroidism 06/10/2013  . Arteriovenous graft for hemodialysis in place, primary 05/24/2013  .  Febrile illness, acute 05/23/2013  . Diabetic retinopathy 04/21/2013  . End stage renal disease 04/21/2013  . Essential hypertension, benign 04/21/2013  . DVT of upper extremity (deep vein thrombosis) 04/02/2013  . Renal failure 04/02/2013  . DM (diabetes mellitus), type 1 04/02/2013  . Blind    Past Medical History  Diagnosis Date  . 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 Hypothyroidism   . Hypertension   . Type I diabetes mellitus   . DVT (deep venous thrombosis)     "both arms" (11/11/2013)  . High cholesterol   . Diabetic retinopathy associated with type 1 diabetes mellitus   . Heart murmur     "slight" (11/11/2013)  . Graves' disease      "had thyroid radioactively treated" (11/11/2013)  . End-stage renal disease on hemodialysis     "TTS; [facility]" (11/11/2013)       Medication List         amLODipine 10 MG tablet  Commonly known as:  NORVASC  Take 1 tablet (10 mg total) by mouth at bedtime.     atorvastatin 40 MG tablet  Commonly known as:  LIPITOR  Take 1 tablet (40 mg total) by mouth daily.     insulin aspart 100 UNIT/ML injection  Commonly known as:  novoLOG  Inject 10-15 Units into the skin 3 (three) times daily before meals. Sliding scale     insulin glargine 100 UNIT/ML injection  Commonly known as:  LANTUS  Inject 0.12 mLs (12 Units total) into the skin 2 (two) times daily.     lanthanum 1000 MG chewable tablet  Commonly known as:  FOSRENOL  Chew 1,000 mg by mouth 3 (three) times daily with meals.     levothyroxine 100 MCG tablet  Commonly known as:  SYNTHROID, LEVOTHROID  Take 1 tablet (100 mcg total) by mouth daily.     multivitamin Tabs tablet  Take 1 tablet by mouth daily.     oxyCODONE 5 MG immediate release tablet  Commonly known as:  ROXICODONE  Take 1 tablet (5 mg total) by mouth every 6 (six) hours as needed for severe pain.     warfarin 2.5 MG tablet  Commonly known as:  COUMADIN  Take 5 mg by mouth daily.        Roxicodone #30 No Refill  Disposition: home  Patient's condition: is Good  Follow up: 1. [facility] as needed   Doreatha Massed, PA-C [facility] [phone] 11/14/2013  1:36 PM

## 2013-11-15 NOTE — Discharge Summary (Signed)
Agree with plans for D/C.  Waverly Ferrarihristopher Dickson, MD, FACS Beeper [phone] 11/15/2013

## 2013-12-06 ENCOUNTER — Telehealth: Payer: Self-pay | Admitting: Family Medicine

## 2013-12-06 NOTE — Telephone Encounter (Signed)
[facility]  Nurse from patient's HD center [facility]) called regarding INR of 5. Pt currently taking 5mg  coumadin daily. No current active bleeding. Advised patient to hold coumadin for next 2 days and re-check INR on Thursday HD session.   Nurse states that her last session at this center is on Saturday as she is moving to [place].  Tawni CarnesAndrew Nirav Sweda, MD 12/06/2013, 1:49 PM PGY-1, [facility]

## 2013-12-09 ENCOUNTER — Encounter: Payer: Self-pay | Admitting: Family Medicine

## 2013-12-09 ENCOUNTER — Ambulatory Visit (INDEPENDENT_AMBULATORY_CARE_PROVIDER_SITE_OTHER): Payer: 59 | Admitting: Family Medicine

## 2013-12-09 VITALS — BP 142/68 | HR 86 | Temp 99.4°F | Ht 62.5 in | Wt 135.0 lb

## 2013-12-09 DIAGNOSIS — H547 Unspecified visual loss: Secondary | ICD-10-CM

## 2013-12-09 DIAGNOSIS — E109 Type 1 diabetes mellitus without complications: Secondary | ICD-10-CM

## 2013-12-09 DIAGNOSIS — E039 Hypothyroidism, unspecified: Secondary | ICD-10-CM

## 2013-12-09 DIAGNOSIS — N186 End stage renal disease: Secondary | ICD-10-CM

## 2013-12-09 DIAGNOSIS — I82629 Acute embolism and thrombosis of deep veins of unspecified upper extremity: Secondary | ICD-10-CM

## 2013-12-09 DIAGNOSIS — H543 Unqualified visual loss, both eyes: Secondary | ICD-10-CM

## 2013-12-09 DIAGNOSIS — E11319 Type 2 diabetes mellitus with unspecified diabetic retinopathy without macular edema: Secondary | ICD-10-CM

## 2013-12-09 DIAGNOSIS — E785 Hyperlipidemia, unspecified: Secondary | ICD-10-CM

## 2013-12-09 DIAGNOSIS — I10 Essential (primary) hypertension: Secondary | ICD-10-CM

## 2013-12-09 DIAGNOSIS — E1139 Type 2 diabetes mellitus with other diabetic ophthalmic complication: Secondary | ICD-10-CM

## 2013-12-09 LAB — POCT GLYCOSYLATED HEMOGLOBIN (HGB A1C): HEMOGLOBIN A1C: 5.6

## 2013-12-09 MED ORDER — LEVOTHYROXINE SODIUM 100 MCG PO TABS: 100.0000 ug | ORAL_TABLET | Freq: Every day | ORAL | Status: DC

## 2013-12-09 MED ORDER — LANTHANUM CARBONATE 1000 MG PO CHEW: 1000.0000 mg | CHEWABLE_TABLET | Freq: Three times a day (TID) | ORAL | Status: DC

## 2013-12-09 MED ORDER — ATORVASTATIN CALCIUM 40 MG PO TABS: 40.0000 mg | ORAL_TABLET | Freq: Every day | ORAL | Status: DC

## 2013-12-09 MED ORDER — INSULIN GLARGINE 100 UNIT/ML ~~LOC~~ SOLN: 12.0000 [IU] | Freq: Two times a day (BID) | SUBCUTANEOUS | Status: DC

## 2013-12-09 MED ORDER — INSULIN ASPART 100 UNIT/ML ~~LOC~~ SOLN: 10.0000 [IU] | Freq: Three times a day (TID) | SUBCUTANEOUS | Status: DC

## 2013-12-09 MED ORDER — AMLODIPINE BESYLATE 10 MG PO TABS: 10.0000 mg | ORAL_TABLET | Freq: Every day | ORAL | Status: DC

## 2013-12-09 NOTE — Assessment & Plan Note (Signed)
142/68 today. MD at hemodialysis recently prescribed losartan 100mg  daily because her pre-dialysis BPs were elevated. Continuing amlodipine.

## 2013-12-09 NOTE — Assessment & Plan Note (Addendum)
Chronically medically non-compliant, though has had improved adherence lately. A1c today 5.6%, without reports of hypoglycemic episodes. Will continue current regimen. [place] [facility] [place]. Urged to quickly establish care with endocrinologist. No need for statin, given good LDL and age < 1540.

## 2013-12-09 NOTE — Assessment & Plan Note (Signed)
Rechecking TSH today. Will titrate synthroid as needed. No subjective indications of sub-optimal therapy.

## 2013-12-09 NOTE — Assessment & Plan Note (Signed)
D/C coumadin.

## 2013-12-09 NOTE — Patient Instructions (Signed)
Pt is blind. Instructions for urgent return to care were reviewed and written prescriptions were supplied.

## 2013-12-09 NOTE — Progress Notes (Signed)
Patient ID: Briscoe Deutscherameka M Ladson, female   DOB: 06/14/1981, 33 y.o.   MRN: [id number]   Subjective:  HPI:   Briscoe Deutscherameka M Mannina is a 33 y.o. female with a history of T1DM with retinopathy causing blindness, nephropathy causing ESRD, upper extremity DVT, and hypothyroidism here for follow up.   She has no acute complaints. She reports improved adherence to checking her sugars and administering insulin. She has been checking her fasting AM sugars which have been 80-100, and giving lantus 12 u in the morning and at night - though she admits that she frequently increases or decreases this if she feels low/high (she does not check her sugar to corroborate this feeling). She is aware that 24u once daily is an appropriate alternative, but prefers the BID dosing. She has not had any hypoglycemic episodes. No diaphoresis, weakness, fatigue, N/V/D/abd pain. No CP, SOB, headaches, vision changes.   She has continued to take coumadin 5mg  daily and reports no non-adherence and no signs of bleeding.   She is moving tomorrow due to issues with her partner. She feels safe, but would like to leave a toxic relationship, so [place]. She requests all medications be refilled on paper prescriptions.  Review of Systems:  Per HPI. All other systems reviewed and are negative.    Past Medical History: Patient Active Problem List   Diagnosis Date Noted  . ESRD (end stage renal disease) on dialysis 11/11/2013  . Unspecified hypothyroidism 06/10/2013  . Arteriovenous graft for hemodialysis in place, primary 05/24/2013  . Diabetic retinopathy 04/21/2013  . End stage renal disease 04/21/2013  . Essential hypertension, benign 04/21/2013  . DVT of upper extremity (deep vein thrombosis) 04/02/2013  . Renal failure 04/02/2013  . DM (diabetes mellitus), type 1 04/02/2013  . Blind     Medications: reviewed and updated Current Outpatient Prescriptions  Medication Sig Dispense Refill  .  amLODipine (NORVASC) 10 MG tablet Take 1 tablet (10 mg total) by mouth at bedtime.  90 tablet  3  . atorvastatin (LIPITOR) 40 MG tablet Take 1 tablet (40 mg total) by mouth daily.  90 tablet  3  . insulin aspart (NOVOLOG) 100 UNIT/ML injection Inject 10-15 Units into the skin 3 (three) times daily before meals. Sliding scale  10 mL  11  . insulin glargine (LANTUS) 100 UNIT/ML injection Inject 0.12 mLs (12 Units total) into the skin 2 (two) times daily.  10 mL  11  . lanthanum (FOSRENOL) 1000 MG chewable tablet Chew 1 tablet (1,000 mg total) by mouth 3 (three) times daily with meals.  90 tablet  11  . levothyroxine (SYNTHROID, LEVOTHROID) 100 MCG tablet Take 1 tablet (100 mcg total) by mouth daily.  90 tablet  3  . multivitamin (RENA-VIT) TABS tablet Take 1 tablet by mouth daily.      Marland Kitchen. oxyCODONE (ROXICODONE) 5 MG immediate release tablet Take 1 tablet (5 mg total) by mouth every 6 (six) hours as needed for severe pain.  30 tablet  0   No current facility-administered medications for this visit.    Objective:  Physical Exam: BP 142/68  Pulse 86  Temp(Src) 99.4 F (37.4 C) (Oral)  Ht 5' 2.5" (1.588 m)  Wt 135 lb (61.236 kg)  BMI 24.28 kg/m2  Gen:  33 y.o. female in NAD HEENT: MMM, EOMI, R pupil ERRL, muddy R sclera. L eye prosthetic.  CV: RRR, no II/VI SEM at RUSB Resp: Non-labored, CTAB, no wheezes  noted Abd: Soft, NTND, BS present, no guarding or organomegaly MSK: No edema noted, full ROM Neuro: Alert and oriented, speech normal (See diabetic foot exam) Assessment:     TAMMEY DEEG is a 33 y.o. female here for chronic disease management.    Plan:     See problem list for problem-specific plans.  - Will have pap smear in [place], [place].

## 2013-12-10 LAB — TSH: TSH: 0.576 u[IU]/mL (ref 0.350–4.500)

## 2014-06-22 ENCOUNTER — Encounter: Payer: Self-pay | Admitting: Nephrology

## 2014-06-26 ENCOUNTER — Emergency Department (HOSPITAL_COMMUNITY): Payer: 59

## 2014-06-26 ENCOUNTER — Encounter (HOSPITAL_COMMUNITY): Payer: Self-pay | Admitting: Emergency Medicine

## 2014-06-26 ENCOUNTER — Emergency Department (HOSPITAL_COMMUNITY)
Admission: EM | Admit: 2014-06-26 | Discharge: 2014-06-26 | Disposition: A | Discharge status: Home / Self Care | Payer: 59 | Attending: Emergency Medicine | Admitting: Emergency Medicine

## 2014-06-26 DIAGNOSIS — Z79899 Other long term (current) drug therapy: Secondary | ICD-10-CM | POA: Diagnosis not present

## 2014-06-26 DIAGNOSIS — I10 Essential (primary) hypertension: Secondary | ICD-10-CM

## 2014-06-26 DIAGNOSIS — H543 Unqualified visual loss, both eyes: Secondary | ICD-10-CM | POA: Diagnosis not present

## 2014-06-26 DIAGNOSIS — N186 End stage renal disease: Secondary | ICD-10-CM | POA: Diagnosis not present

## 2014-06-26 DIAGNOSIS — I120 Hypertensive chronic kidney disease with stage 5 chronic kidney disease or end stage renal disease: Secondary | ICD-10-CM | POA: Insufficient documentation

## 2014-06-26 DIAGNOSIS — E11319 Type 2 diabetes mellitus with unspecified diabetic retinopathy without macular edema: Secondary | ICD-10-CM | POA: Diagnosis not present

## 2014-06-26 DIAGNOSIS — Z992 Dependence on renal dialysis: Secondary | ICD-10-CM | POA: Insufficient documentation

## 2014-06-26 DIAGNOSIS — Z794 Long term (current) use of insulin: Secondary | ICD-10-CM | POA: Insufficient documentation

## 2014-06-26 DIAGNOSIS — Z86718 Personal history of other venous thrombosis and embolism: Secondary | ICD-10-CM | POA: Diagnosis not present

## 2014-06-26 DIAGNOSIS — R739 Hyperglycemia, unspecified: Secondary | ICD-10-CM

## 2014-06-26 DIAGNOSIS — E0500 Thyrotoxicosis with diffuse goiter without thyrotoxic crisis or storm: Secondary | ICD-10-CM | POA: Insufficient documentation

## 2014-06-26 DIAGNOSIS — E1039 Type 1 diabetes mellitus with other diabetic ophthalmic complication: Secondary | ICD-10-CM | POA: Insufficient documentation

## 2014-06-26 LAB — BLOOD GAS, VENOUS
Acid-base deficit: 0.3 mmol/L (ref 0.0–2.0)
BICARBONATE: 24.2 meq/L — AB (ref 20.0–24.0)
O2 SAT: 83.2 %
Patient temperature: 98.6
TCO2: 21.8 mmol/L (ref 0–100)
pCO2, Ven: 41.1 mmHg — ABNORMAL LOW (ref 45.0–50.0)
pH, Ven: 7.387 — ABNORMAL HIGH (ref 7.250–7.300)
pO2, Ven: 49.9 mmHg — ABNORMAL HIGH (ref 30.0–45.0)

## 2014-06-26 LAB — URINALYSIS, ROUTINE W REFLEX MICROSCOPIC
Bilirubin Urine: NEGATIVE
Glucose, UA: >1000 mg/dL — AB
Ketones, ur: NEGATIVE mg/dL
Leukocytes, UA: NEGATIVE
Nitrite: NEGATIVE
PH: 7.5 (ref 5.0–8.0)
Protein, ur: >300 mg/dL — AB
Specific Gravity, Urine: 1.018 (ref 1.005–1.030)
Urobilinogen, UA: 0.2 mg/dL (ref 0.0–1.0)

## 2014-06-26 LAB — CBG MONITORING, ED
Glucose-Capillary: 527 mg/dL — ABNORMAL HIGH (ref 70–99)
Glucose-Capillary: 25 mg/dL — CL (ref 70–99)
Glucose-Capillary: 137 mg/dL — ABNORMAL HIGH (ref 70–99)
Glucose-Capillary: 37 mg/dL — CL (ref 70–99)
Glucose-Capillary: 70 mg/dL (ref 70–99)
Glucose-Capillary: 259 mg/dL — ABNORMAL HIGH (ref 70–99)

## 2014-06-26 LAB — BASIC METABOLIC PANEL
Anion gap: 19 — ABNORMAL HIGH (ref 5–15)
Anion gap: 20 — ABNORMAL HIGH (ref 5–15)
BUN: 64 mg/dL — AB (ref 6–23)
BUN: 64 mg/dL — ABNORMAL HIGH (ref 6–23)
CO2: 21 mEq/L (ref 19–32)
CO2: 19 meq/L (ref 19–32)
Calcium: 8.8 mg/dL (ref 8.4–10.5)
Calcium: 8.6 mg/dL (ref 8.4–10.5)
Chloride: 93 mEq/L — ABNORMAL LOW (ref 96–112)
Chloride: 99 mEq/L (ref 96–112)
Creatinine, Ser: 9.3 mg/dL — ABNORMAL HIGH (ref 0.50–1.10)
Creatinine, Ser: 9.26 mg/dL — ABNORMAL HIGH (ref 0.50–1.10)
GFR calc Af Amer: 6 mL/min — ABNORMAL LOW (ref >90)
GFR calc Af Amer: 6 mL/min — ABNORMAL LOW (ref >90)
GFR calc non Af Amer: 5 mL/min — ABNORMAL LOW (ref >90)
GFR, EST NON AFRICAN AMERICAN: 5 mL/min — AB (ref >90)
GLUCOSE: 33 mg/dL — AB (ref 70–99)
Glucose, Bld: 349 mg/dL — ABNORMAL HIGH (ref 70–99)
POTASSIUM: 4.5 meq/L (ref 3.7–5.3)
Potassium: 5 mEq/L (ref 3.7–5.3)
SODIUM: 138 meq/L (ref 137–147)
Sodium: 133 mEq/L — ABNORMAL LOW (ref 137–147)

## 2014-06-26 LAB — CBC
HCT: 30.4 % — ABNORMAL LOW (ref 36.0–46.0)
Hemoglobin: 10.5 g/dL — ABNORMAL LOW (ref 12.0–15.0)
MCH: 31 pg (ref 26.0–34.0)
MCHC: 34.5 g/dL (ref 30.0–36.0)
MCV: 89.7 fL (ref 78.0–100.0)
PLATELETS: 149 10*3/uL — AB (ref 150–400)
RBC: 3.39 MIL/uL — AB (ref 3.87–5.11)
RDW: 14.3 % (ref 11.5–15.5)
WBC: 7 10*3/uL (ref 4.0–10.5)

## 2014-06-26 LAB — TROPONIN I: Troponin I: <0.3 ng/mL (ref <0.30)

## 2014-06-26 LAB — KETONES, QUALITATIVE: Acetone, Bld: NEGATIVE

## 2014-06-26 LAB — URINE MICROSCOPIC-ADD ON

## 2014-06-26 LAB — PROTIME-INR
INR: 0.94 (ref 0.00–1.49)
Prothrombin Time: 12.6 seconds (ref 11.6–15.2)

## 2014-06-26 MED ORDER — ONDANSETRON HCL 4 MG/2ML IJ SOLN
4.0000 mg | Freq: Once | INTRAMUSCULAR | Status: AC
  Administered 2014-06-26: 4 mg via INTRAVENOUS
  Filled 2014-06-26: qty 2

## 2014-06-26 MED ORDER — SODIUM CHLORIDE 0.9 % IV BOLUS (SEPSIS)
1000.0000 mL | Freq: Once | INTRAVENOUS | Status: AC
  Administered 2014-06-26: 1000 mL via INTRAVENOUS

## 2014-06-26 MED ORDER — SODIUM CHLORIDE 0.9 % IV BOLUS (SEPSIS): 1000.0000 mL | Freq: Once | INTRAVENOUS | Status: DC

## 2014-06-26 MED ORDER — DEXTROSE 50 % IV SOLN
1.0000 | Freq: Once | INTRAVENOUS | Status: DC
  Filled 2014-06-26: qty 50

## 2014-06-26 MED ORDER — AMLODIPINE BESYLATE 10 MG PO TABS
10.0000 mg | ORAL_TABLET | Freq: Once | ORAL | Status: AC
  Administered 2014-06-26: 10 mg via ORAL
  Filled 2014-06-26: qty 1

## 2014-06-26 NOTE — ED Notes (Addendum)
Pt c/o HTN x 2 weeks, no headache until yesterday. Pt did take amlodipine and lisinopril last night and has been compliant, pt also states he glucose was high this morning.

## 2014-06-26 NOTE — ED Notes (Signed)
Pt reports she woke up with cold sweats this morning which happen when her blood sugar drops in the middle of the night; checked her sugar before she came in and her meter would not even read it.

## 2014-06-26 NOTE — ED Provider Notes (Signed)
CSN: [id number]     Arrival date & time 06/26/14  1349 History   First MD Initiated Contact with Patient 06/26/14 1502     Chief Complaint  Patient presents with  . Hypertension  . Hyperglycemia     (Consider location/radiation/quality/duration/timing/severity/associated sxs/prior Treatment) HPI Comments: Patient is a 33 yo F PMHx significant for HTN, ESRD on HD TTS, DM type I, hypothyroidism presenting to the ED for two complaints. Patient's first complaint is one week of increased blood pressure readings despite taking at home antihypertensives. Patient states her systolic readings have been over 200 at dialysis. She complains of a slight waxing and waning headache. Patient is also complaining of elevated blood sugar readings at home. The patient endorses fatigue, chills without documented fever and increased urine production. Denies any associated cough, nausea, vomiting, diarrhea, abdominal pain. Patient has not missed any hemodialysis appointment.  Patient is a 33 y.o. female presenting with hypertension and hyperglycemia.  Hypertension Associated symptoms include chills, fatigue and headaches. Pertinent negatives include no abdominal pain, numbness, vomiting or weakness.  Hyperglycemia Associated symptoms: fatigue   Associated symptoms: no abdominal pain, no dysuria and no vomiting     Past Medical History  Diagnosis Date  . 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 Hypothyroidism   . Hypertension   . Type I diabetes mellitus   . DVT (deep venous thrombosis)     "both arms" (11/11/2013)  . High cholesterol   . Diabetic retinopathy associated with type 1 diabetes mellitus   . Heart murmur     "slight" (11/11/2013)  . Graves' disease     "had thyroid radioactively treated" (11/11/2013)  . End-stage renal disease on hemodialysis     "TTS; [facility]" (11/11/2013)   Past Surgical History  Procedure Laterality Date  . Insertion of dialysis catheter N/A 04/01/2013   Procedure: INSERTION OF DIALYSIS CATHETER;  Surgeon: Pryor Ochoa, MD;  Location: [facility] OR;  Service: Vascular;  Laterality: N/A;  . Av fistula placement Right 04/07/2013    Procedure: INSERTION OF ARTERIOVENOUS (AV) GORE-TEX GRAFT THIGH;  Surgeon: Chuck Hint, MD;  Location: [facility] OR;  Service: Vascular;  Laterality: Right;  . Cataract extraction w/ intraocular lens implant Right 2009  . Eye surgery Right 2009    "laser OR for diabetic retinopathy" (05/23/2013)  . Enucleation Left ~ 2010  . Revision of arteriovenous goretex graft Right 11/11/2013    Procedure: REVISION OF ARTERIOVENOUS GORETEX GRAFT - RIGHT THIGH;  Surgeon: Chuck Hint, MD;  Location: [facility] OR;  Service: Vascular;  Laterality: Right;   Family History  Problem Relation Age of Onset  . Hypertension    . Cancer    . Thyroid disease    . Cancer Mother   . Asthma Father    History  Substance Use Topics  . Smoking status: Never Smoker   . Smokeless tobacco: Never Used  . Alcohol Use: No   OB History   Grav Para Term Preterm Abortions TAB SAB Ect Mult Living                 Review of Systems  Constitutional: Positive for chills and fatigue.  Gastrointestinal: Negative for vomiting, abdominal pain and diarrhea.  Genitourinary: Positive for frequency. Negative for dysuria.  Neurological: Positive for headaches. Negative for syncope, weakness and numbness.  All other systems reviewed and are negative.     Allergies  Benadryl  Home Medications   Prior to Admission  medications   Medication Sig Start Date End Date Taking? Authorizing Provider  amLODipine (NORVASC) 10 MG tablet Take 1 tablet (10 mg total) by mouth at bedtime. 12/09/13  Yes Tyrone Nine, MD  insulin aspart (NOVOLOG) 100 UNIT/ML injection Inject 10-15 Units into the skin 3 (three) times daily before meals. Sliding scale 12/09/13  Yes Tyrone Nine, MD  insulin glargine (LANTUS) 100 UNIT/ML injection Inject 0.12 mLs (12 Units total) into the skin 2  (two) times daily. 12/09/13  Yes Tyrone Nine, MD  levothyroxine (SYNTHROID, LEVOTHROID) 100 MCG tablet Take 1 tablet (100 mcg total) by mouth daily. 12/09/13  Yes Tyrone Nine, MD  lisinopril (PRINIVIL,ZESTRIL) 40 MG tablet Take 40 mg by mouth daily.   Yes Historical Provider, MD   BP 169/85  Pulse 89  Temp(Src) 98 F (36.7 C) (Oral)  Resp 18  SpO2 99%  LMP 06/10/2014 Physical Exam  Nursing note and vitals reviewed. Constitutional: She is oriented to person, place, and time. She appears well-developed and well-nourished. No distress.  HENT:  Head: Normocephalic and atraumatic.  Right Ear: External ear normal.  Left Ear: External ear normal.  Nose: Nose normal.  Mouth/Throat: Oropharynx is clear and moist.  Eyes: Conjunctivae are normal.  Neck: Normal range of motion. Neck supple.  Cardiovascular: Normal rate and regular rhythm.   Murmur heard. Pulmonary/Chest: Effort normal and breath sounds normal. No respiratory distress.  Abdominal: Soft. Bowel sounds are normal. There is no tenderness.  Musculoskeletal: Normal range of motion.  Neurological: She is alert and oriented to person, place, and time.  Skin: Skin is warm and dry. She is not diaphoretic.  Psychiatric: She has a normal mood and affect.    ED Course  Procedures (including critical care time) Medications  dextrose 50 % solution 50 mL (not administered)  sodium chloride 0.9 % bolus 1,000 mL (0 mLs Intravenous Stopped 06/26/14 2104)  amLODipine (NORVASC) tablet 10 mg (10 mg Oral Given 06/26/14 1901)  ondansetron (ZOFRAN) injection 4 mg (4 mg Intravenous Given 06/26/14 2000)    Labs Review Labs Reviewed  BASIC METABOLIC PANEL - Abnormal; Notable for the following:    Sodium 133 (*)    Chloride 93 (*)    Glucose, Bld 349 (*)    BUN 64 (*)    Creatinine, Ser 9.30 (*)    GFR calc non Af Amer 5 (*)    GFR calc Af Amer 6 (*)    Anion gap 19 (*)    All other components within normal limits  CBC - Abnormal; Notable for  the following:    RBC 3.39 (*)    Hemoglobin 10.5 (*)    HCT 30.4 (*)    Platelets 149 (*)    All other components within normal limits  BLOOD GAS, VENOUS - Abnormal; Notable for the following:    pH, Ven 7.387 (*)    pCO2, Ven 41.1 (*)    pO2, Ven 49.9 (*)    Bicarbonate 24.2 (*)    All other components within normal limits  URINALYSIS, ROUTINE W REFLEX MICROSCOPIC - Abnormal; Notable for the following:    Glucose, UA >1000 (*)    Hgb urine dipstick MODERATE (*)    Protein, ur >300 (*)    All other components within normal limits  BASIC METABOLIC PANEL - Abnormal; Notable for the following:    Glucose, Bld 33 (*)    BUN 64 (*)    Creatinine, Ser 9.26 (*)    GFR  calc non Af Amer 5 (*)    GFR calc Af Amer 6 (*)    Anion gap 20 (*)    All other components within normal limits  CBG MONITORING, ED - Abnormal; Notable for the following:    Glucose-Capillary 527 (*)    All other components within normal limits  CBG MONITORING, ED - Abnormal; Notable for the following:    Glucose-Capillary 25 (*)    All other components within normal limits  CBG MONITORING, ED - Abnormal; Notable for the following:    Glucose-Capillary 37 (*)    All other components within normal limits  CBG MONITORING, ED - Abnormal; Notable for the following:    Glucose-Capillary 137 (*)    All other components within normal limits  CBG MONITORING, ED - Abnormal; Notable for the following:    Glucose-Capillary 259 (*)    All other components within normal limits  URINE CULTURE  KETONES, QUALITATIVE  PROTIME-INR  TROPONIN I  URINE MICROSCOPIC-ADD ON  CBG MONITORING, ED    Imaging Review Dg Chest 2 View  06/26/2014   CLINICAL DATA:  Difficulty breathing  EXAM: CHEST  2 VIEW  COMPARISON:  November 11, 2013  FINDINGS: Lungs are clear. Heart size and pulmonary vascularity are normal. No adenopathy. No pneumothorax. No bone lesions.  IMPRESSION: No edema or consolidation.   Electronically Signed   By: Bretta Bang M.D.   On: 06/26/2014 15:53     EKG Interpretation None      LMP first week of September. Urine culture sent, will call in antibiotic for   MDM   Final diagnoses:  Hyperglycemia  Hypertension not at goal    Filed Vitals:   06/26/14 2231  BP: 169/85  Pulse: 89  Temp:   Resp: 18   Afebrile, NAD, non-toxic appearing, AAOx4. I have reviewed nursing notes, vital signs, and all appropriate lab and imaging results for this patient.  1) Hyperglycemia: Patient hyperglycemic upon arrival. Patient did not take any insulin PTA. Mild AG noted. 1L NS given. Patient noted to be hypoglycemic at 25 --> 37 --> improved after juice intake. Patient monitored and glucose stabilized with two readings above 100. Patient did not require 1 amp of D50 for hypoglycemic correction. Patient without serum or ua ketones, low suspicion for DKA.   2) HTN: Patient noted to be hypertensive in the emergency department.  No signs of hypertensive urgency.  Discussed that patient should discuss this with her PCP on Friday at appointment for possible HTN regimen change.   Patient d/w with Dr. Juleen China, agrees with plan.    Jeannetta Ellis, PA-C 06/27/14 0018

## 2014-06-26 NOTE — ED Notes (Signed)
I checked patients blood sugar due to her saying she felt clammy, nurse was notified. Also after checking Blood sugar, it was low and gave patient something to eat and drink.Nurse was notified again.

## 2014-06-26 NOTE — ED Notes (Signed)
2 attempts made for PIV access. Unsuccessful. IV team paged for access.

## 2014-06-26 NOTE — Discharge Instructions (Signed)
Please follow up with your primary care physician in 1-2 days. If you do not have one please call the [facility] number listed above. Please continue to take all of your home medications as prescribed. Please read all discharge instructions and return precautions.   Hypertension Hypertension, commonly called high blood pressure, is when the force of blood pumping through your arteries is too strong. Your arteries are the blood vessels that carry blood from your heart throughout your body. A blood pressure reading consists of a higher number over a lower number, such as 110/72. The higher number (systolic) is the pressure inside your arteries when your heart pumps. The lower number (diastolic) is the pressure inside your arteries when your heart relaxes. Ideally you want your blood pressure below 120/80. Hypertension forces your heart to work harder to pump blood. Your arteries may become narrow or stiff. Having hypertension puts you at risk for heart disease, stroke, and other problems.  RISK FACTORS Some risk factors for high blood pressure are controllable. Others are not.  Risk factors you cannot control include:   Race. You may be at higher risk if you are African American.  Age. Risk increases with age.  Gender. Men are at higher risk than women before age 54 years. After age 84, women are at higher risk than men. Risk factors you can control include:  Not getting enough exercise or physical activity.  Being overweight.  Getting too much fat, sugar, calories, or salt in your diet.  Drinking too much alcohol. SIGNS AND SYMPTOMS Hypertension does not usually cause signs or symptoms. Extremely high blood pressure (hypertensive crisis) may cause headache, anxiety, shortness of breath, and nosebleed. DIAGNOSIS  To check if you have hypertension, your health care provider will measure your blood pressure while you are seated, with your arm held at the level of your  heart. It should be measured at least twice using the same arm. Certain conditions can cause a difference in blood pressure between your right and left arms. A blood pressure reading that is higher than normal on one occasion does not mean that you need treatment. If one blood pressure reading is high, ask your health care provider about having it checked again. TREATMENT  Treating high blood pressure includes making lifestyle changes and possibly taking medicine. Living a healthy lifestyle can help lower high blood pressure. You may need to change some of your habits. Lifestyle changes may include:  Following the DASH diet. This diet is high in fruits, vegetables, and whole grains. It is low in salt, red meat, and added sugars.  Getting at least 2 hours of brisk physical activity every week.  Losing weight if necessary.  Not smoking.  Limiting alcoholic beverages.  Learning ways to reduce stress. If lifestyle changes are not enough to get your blood pressure under control, your health care provider may prescribe medicine. You may need to take more than one. Work closely with your health care provider to understand the risks and benefits. HOME CARE INSTRUCTIONS  Have your blood pressure rechecked as directed by your health care provider.   Take medicines only as directed by your health care provider. Follow the directions carefully. Blood pressure medicines must be taken as prescribed. The medicine does not work as well when you skip doses. Skipping doses also puts you at risk for problems.   Do not smoke.   Monitor your blood pressure at home as directed by your health care provider. SEEK MEDICAL  CARE IF:   You think you are having a reaction to medicines taken.  You have recurrent headaches or feel dizzy.  You have swelling in your ankles.  You have trouble with your vision. SEEK IMMEDIATE MEDICAL CARE IF:  You develop a severe headache or confusion.  You have unusual  weakness, numbness, or feel faint.  You have severe chest or abdominal pain.  You vomit repeatedly.  You have trouble breathing. MAKE SURE YOU:   Understand these instructions.  Will watch your condition.  Will get help right away if you are not doing well or get worse. Document Released: 09/22/2005 Document Revised: 02/06/2014 Document Reviewed: 07/15/2013 [facility] Patient Information 2015 [place], [place]. This information is not intended to replace advice given to you by your health care provider. Make sure you discuss any questions you have with your health care provider.  Hyperglycemia Hyperglycemia occurs when the glucose (sugar) in your blood is too high. Hyperglycemia can happen for many reasons, but it most often happens to people who do not know they have diabetes or are not managing their diabetes properly.  CAUSES  Whether you have diabetes or not, there are other causes of hyperglycemia. Hyperglycemia can occur when you have diabetes, but it can also occur in other situations that you might not be as aware of, such as: Diabetes  If you have diabetes and are having problems controlling your blood glucose, hyperglycemia could occur because of some of the following reasons:  Not following your meal plan.  Not taking your diabetes medications or not taking it properly.  Exercising less or doing less activity than you normally do.  Being sick. Pre-diabetes  This cannot be ignored. Before people develop Type 2 diabetes, they almost always have "pre-diabetes." This is when your blood glucose levels are higher than normal, but not yet high enough to be diagnosed as diabetes. Research has shown that some long-term damage to the body, especially the heart and circulatory system, may already be occurring during pre-diabetes. If you take action to manage your blood glucose when you have pre-diabetes, you may delay or prevent Type 2 diabetes from developing. Stress  If you have  diabetes, you may be "diet" controlled or on oral medications or insulin to control your diabetes. However, you may find that your blood glucose is higher than usual in the hospital whether you have diabetes or not. This is often referred to as "stress hyperglycemia." Stress can elevate your blood glucose. This happens because of hormones put out by the body during times of stress. If stress has been the cause of your high blood glucose, it can be followed regularly by your caregiver. That way he/she can make sure your hyperglycemia does not continue to get worse or progress to diabetes. Steroids  Steroids are medications that act on the infection fighting system (immune system) to block inflammation or infection. One side effect can be a rise in blood glucose. Most people can produce enough extra insulin to allow for this rise, but for those who cannot, steroids make blood glucose levels go even higher. It is not unusual for steroid treatments to "uncover" diabetes that is developing. It is not always possible to determine if the hyperglycemia will go away after the steroids are stopped. A special blood test called an A1c is sometimes done to determine if your blood glucose was elevated before the steroids were started. SYMPTOMS  Thirsty.  Frequent urination.  Dry mouth.  Blurred vision.  Tired or fatigue.  Weakness.  Sleepy.  Tingling in feet or leg. DIAGNOSIS  Diagnosis is made by monitoring blood glucose in one or all of the following ways:  A1c test. This is a chemical found in your blood.  Fingerstick blood glucose monitoring.  Laboratory results. TREATMENT  First, knowing the cause of the hyperglycemia is important before the hyperglycemia can be treated. Treatment may include, but is not be limited to:  Education.  Change or adjustment in medications.  Change or adjustment in meal plan.  Treatment for an illness, infection, etc.  More frequent blood glucose  monitoring.  Change in exercise plan.  Decreasing or stopping steroids.  Lifestyle changes. HOME CARE INSTRUCTIONS   Test your blood glucose as directed.  Exercise regularly. Your caregiver will give you instructions about exercise. Pre-diabetes or diabetes which comes on with stress is helped by exercising.  Eat wholesome, balanced meals. Eat often and at regular, fixed times. Your caregiver or nutritionist will give you a meal plan to guide your sugar intake.  Being at an ideal weight is important. If needed, losing as little as 10 to 15 pounds may help improve blood glucose levels. SEEK MEDICAL CARE IF:   You have questions about medicine, activity, or diet.  You continue to have symptoms (problems such as increased thirst, urination, or weight gain). SEEK IMMEDIATE MEDICAL CARE IF:   You are vomiting or have diarrhea.  Your breath smells fruity.  You are breathing faster or slower.  You are very sleepy or incoherent.  You have numbness, tingling, or pain in your feet or hands.  You have chest pain.  Your symptoms get worse even though you have been following your caregiver's orders.  If you have any other questions or concerns. Document Released: 03/18/2001 Document Revised: 12/15/2011 Document Reviewed: 01/19/2012 [facility] Patient Information 2015 [place], [place]. This information is not intended to replace advice given to you by your health care provider. Make sure you discuss any questions you have with your health care provider.

## 2014-06-27 ENCOUNTER — Encounter (HOSPITAL_COMMUNITY): Payer: Self-pay | Admitting: Emergency Medicine

## 2014-06-27 ENCOUNTER — Emergency Department (HOSPITAL_COMMUNITY)
Admission: EM | Admit: 2014-06-27 | Discharge: 2014-06-27 | Disposition: A | Discharge status: Home / Self Care | Payer: 59 | Attending: Emergency Medicine | Admitting: Emergency Medicine

## 2014-06-27 DIAGNOSIS — Z79899 Other long term (current) drug therapy: Secondary | ICD-10-CM | POA: Insufficient documentation

## 2014-06-27 DIAGNOSIS — R011 Cardiac murmur, unspecified: Secondary | ICD-10-CM | POA: Diagnosis not present

## 2014-06-27 DIAGNOSIS — Z86718 Personal history of other venous thrombosis and embolism: Secondary | ICD-10-CM | POA: Insufficient documentation

## 2014-06-27 DIAGNOSIS — Z992 Dependence on renal dialysis: Secondary | ICD-10-CM | POA: Insufficient documentation

## 2014-06-27 DIAGNOSIS — H543 Unqualified visual loss, both eyes: Secondary | ICD-10-CM | POA: Insufficient documentation

## 2014-06-27 DIAGNOSIS — R519 Headache, unspecified: Secondary | ICD-10-CM

## 2014-06-27 DIAGNOSIS — E039 Hypothyroidism, unspecified: Secondary | ICD-10-CM | POA: Insufficient documentation

## 2014-06-27 DIAGNOSIS — N186 End stage renal disease: Secondary | ICD-10-CM | POA: Diagnosis not present

## 2014-06-27 DIAGNOSIS — I120 Hypertensive chronic kidney disease with stage 5 chronic kidney disease or end stage renal disease: Secondary | ICD-10-CM | POA: Insufficient documentation

## 2014-06-27 DIAGNOSIS — Z794 Long term (current) use of insulin: Secondary | ICD-10-CM | POA: Diagnosis not present

## 2014-06-27 DIAGNOSIS — E1069 Type 1 diabetes mellitus with other specified complication: Secondary | ICD-10-CM | POA: Diagnosis not present

## 2014-06-27 DIAGNOSIS — I10 Essential (primary) hypertension: Secondary | ICD-10-CM

## 2014-06-27 DIAGNOSIS — R51 Headache: Secondary | ICD-10-CM | POA: Diagnosis not present

## 2014-06-27 LAB — URINE CULTURE
CULTURE: NO GROWTH
Colony Count: NO GROWTH

## 2014-06-27 LAB — CBG MONITORING, ED: Glucose-Capillary: 164 mg/dL — ABNORMAL HIGH (ref 70–99)

## 2014-06-27 NOTE — Discharge Instructions (Signed)
Start keeping a log of your blood sugars and blood pressure for review with your PCP.

## 2014-06-27 NOTE — ED Notes (Signed)
IV Team paged and at bedside to de-access Pt's Dialysis graft site.

## 2014-06-27 NOTE — ED Notes (Signed)
Pt reports is here to be evaluated for her BP, reports he CBG "did drop once" - reports CBG of 48 PTA.

## 2014-06-27 NOTE — ED Notes (Signed)
Per EMS pt was at dialysis and after finishing dialysis pt blood sugar dropped to 25. D50 was given at facility and  cbg returned to 130. EMS CBG en route 138.  Pt is A&O. Pt sts she has had a HA since Saturday. Pt has hx of HTN and sts she has been having problems keeping BP down. EMS reports pt was orthostatic  BP-180-80(laying), BP-160/72 (sitting). Pt received bolus enroute. EMS vitals: 214/96, HR 94, RM Air 100%  PT IS BLIND IN BOTH EYES

## 2014-06-27 NOTE — ED Notes (Signed)
Dr. Judd Lien and ED Resident at bedside.

## 2014-06-27 NOTE — ED Provider Notes (Signed)
Medical screening examination/treatment/procedure(s) were performed by non-physician practitioner and as supervising physician I was immediately available for consultation/collaboration.   EKG Interpretation None       Uno Esau, MD 06/27/14 0040 

## 2014-06-27 NOTE — ED Provider Notes (Signed)
CSN: [id number]     Arrival date & time 06/27/14  1824 History   First MD Initiated Contact with Patient 06/27/14 1933     Chief Complaint  Patient presents with  . Hypoglycemia     (Consider location/radiation/quality/duration/timing/severity/associated sxs/prior Treatment) Patient is a 33 y.o. female presenting with hypoglycemia.  Hypoglycemia Associated symptoms: no shortness of breath    Pt is a 33 y/o female w/ PMHx of HTN, blindness, ESRD, and DM1 who presents to the ED for headache, hypoglycemia, and HTN. She states that her BP has been difficult to control x 3 weeks and has fluctuated during dialysis. She has an appointment w/ her PCP this Friday. She is currently on lisinopril  and norvasc  daily. She reports compliance w/ meds. Her dialysis physician gave her a prescription for metoprolol 25 mg today that she has not filled. She states that she started having a HA located on her forehead since Saturday that is new for her and was seen in the ED yesterday for this reason. She has tried tylenol for her HA which slightly eases pain, and she rates HA pain as 3/10 in severity. Pt states that she has fluctuating blood sugars. She is currently taking lantus 14 units BID and novolog w/ sliding scale (average 10 units) w/ meals. She states that she has difficulties w/hypoglycemia but knows when her blood sugars are low. Her last hypoglycemic episode was last night bur resolved after breakfast. Pt states she eats regularly and is compliant w/ insulin. Denies nausea, chest pain, SOB, congestion, and weakness.                                                                     Past Medical History  Diagnosis Date  . 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 Hypothyroidism   . Hypertension   . Type I diabetes mellitus   . DVT (deep venous thrombosis)     "both arms" (11/11/2013)  . High cholesterol   . Diabetic retinopathy associated with type 1 diabetes mellitus   .  Heart murmur     "slight" (11/11/2013)  . Graves' disease     "had thyroid radioactively treated" (11/11/2013)  . End-stage renal disease on hemodialysis     "TTS; [facility]" (11/11/2013)   Past Surgical History  Procedure Laterality Date  . Insertion of dialysis catheter N/A 04/01/2013    Procedure: INSERTION OF DIALYSIS CATHETER;  Surgeon: Pryor Ochoa, MD;  Location: [facility] OR;  Service: Vascular;  Laterality: N/A;  . Av fistula placement Right 04/07/2013    Procedure: INSERTION OF ARTERIOVENOUS (AV) GORE-TEX GRAFT THIGH;  Surgeon: Chuck Hint, MD;  Location: [facility] OR;  Service: Vascular;  Laterality: Right;  . Cataract extraction w/ intraocular lens implant Right 2009  . Eye surgery Right 2009    "laser OR for diabetic retinopathy" (05/23/2013)  . Enucleation Left ~ 2010  . Revision of arteriovenous goretex graft Right 11/11/2013    Procedure: REVISION OF ARTERIOVENOUS GORETEX GRAFT - RIGHT THIGH;  Surgeon: Chuck Hint, MD;  Location: [facility] OR;  Service: Vascular;  Laterality: Right;   Family History  Problem Relation Age of Onset  . Hypertension    . Cancer    .  Thyroid disease    . Cancer Mother   . Asthma Father    History  Substance Use Topics  . Smoking status: Never Smoker   . Smokeless tobacco: Never Used  . Alcohol Use: No   OB History   Grav Para Term Preterm Abortions TAB SAB Ect Mult Living                 Review of Systems  Constitutional: Negative for fever.  HENT: Negative for congestion and ear pain.   Eyes:       Prosthetic left eye, complete blindness    Respiratory: Negative for shortness of breath.   Cardiovascular: Negative for chest pain.  Gastrointestinal: Negative for nausea.  Genitourinary: Negative for dysuria and difficulty urinating.  Neurological: Positive for light-headedness and headaches. Negative for weakness.      Allergies  Review of patient's allergies indicates no active allergies.  Home Medications   Prior to  Admission medications   Medication Sig Start Date End Date Taking? Authorizing Provider  amLODipine (NORVASC) 10 MG tablet Take 10 mg by mouth at bedtime.   Yes Historical Provider, MD  insulin aspart (NOVOLOG) 100 UNIT/ML injection Inject 10-15 Units into the skin 3 (three) times daily before meals.   Yes Historical Provider, MD  insulin glargine (LANTUS) 100 UNIT/ML injection Inject 14 Units into the skin 2 (two) times daily.   Yes Historical Provider, MD  levothyroxine (SYNTHROID, LEVOTHROID) 100 MCG tablet Take 100 mcg by mouth daily before breakfast.   Yes Historical Provider, MD  lisinopril (PRINIVIL,ZESTRIL) 40 MG tablet Take 40 mg by mouth daily.   Yes Historical Provider, MD   BP 184/92  Pulse 78  Temp(Src) 98.5 F (36.9 C) (Oral)  Resp 16  SpO2 100%  LMP 06/10/2014 Physical Exam  Constitutional: She appears well-developed and well-nourished. No distress.  HENT:  Mouth/Throat: Oropharynx is clear and moist.  Tender to palpation of frontal sinus, no post nasal drip   Cardiovascular: Normal rate and regular rhythm.   Murmur (loudest at lt sternal border) heard. Pulmonary/Chest: Effort normal and breath sounds normal. She has no wheezes.  Abdominal: Soft. Bowel sounds are normal. There is no tenderness.  Musculoskeletal: She exhibits no edema.  Lymphadenopathy:    She has no cervical adenopathy.  Skin: Skin is warm and dry.  Psychiatric:  Flat affect     ED Course  Procedures (including critical care time) Labs Review Labs Reviewed  CBG MONITORING, ED - Abnormal; Notable for the following:    Glucose-Capillary 164 (*)    All other components within normal limits    Imaging Review Dg Chest 2 View  06/26/2014   CLINICAL DATA:  Difficulty breathing  EXAM: CHEST  2 VIEW  COMPARISON:  November 11, 2013  FINDINGS: Lungs are clear. Heart size and pulmonary vascularity are normal. No adenopathy. No pneumothorax. No bone lesions.  IMPRESSION: No edema or consolidation.    Electronically Signed   By: Bretta Bang M.D.   On: 06/26/2014 15:53     MDM   Final diagnoses:  Essential hypertension  Headache in front of head    Pt presents to the ED w/ HA, HTN, and hypoglycemia. In the ED her CBG was 164, BP was WNL. HA's could be due to dehydration as she was orthostatic per EMS vitals, hypoglycemia, HTN, sinusitis or dialysis. No concern for hypertensive urgency. Pt advised not to take beta blocker and keep a journal of BP and CBG for review by her PCP  this Friday.  Pt stable for d/c home.       Gara Kroner, MD 06/27/14 2110

## 2014-06-27 NOTE — ED Notes (Signed)
Pt comfortable with discharge and follow up instructions. Pt declines wheelchair, escorted to waiting area by this RN. No prescriptions. 

## 2014-06-28 ENCOUNTER — Ambulatory Visit (INDEPENDENT_AMBULATORY_CARE_PROVIDER_SITE_OTHER): Payer: 59 | Admitting: Family Medicine

## 2014-06-28 ENCOUNTER — Encounter (HOSPITAL_COMMUNITY): Payer: Self-pay | Admitting: Emergency Medicine

## 2014-06-28 ENCOUNTER — Ambulatory Visit (HOSPITAL_COMMUNITY)
Admission: RE | Admit: 2014-06-28 | Discharge: 2014-06-28 | Disposition: A | Discharge status: Home / Self Care | Payer: Medicare Other | Source: Ambulatory Visit | Attending: Family Medicine | Admitting: Family Medicine

## 2014-06-28 ENCOUNTER — Encounter: Payer: Self-pay | Admitting: Family Medicine

## 2014-06-28 ENCOUNTER — Observation Stay (HOSPITAL_COMMUNITY)
Admission: EM | Admit: 2014-06-28 | Discharge: 2014-07-01 | Disposition: A | Discharge status: Home / Self Care | Payer: Commercial Managed Care - PPO | Attending: Family Medicine | Admitting: Family Medicine

## 2014-06-28 ENCOUNTER — Emergency Department (HOSPITAL_COMMUNITY): Payer: Commercial Managed Care - PPO

## 2014-06-28 VITALS — BP 158/100 | HR 85 | Temp 98.5°F | Wt 128.0 lb

## 2014-06-28 DIAGNOSIS — R0789 Other chest pain: Secondary | ICD-10-CM | POA: Diagnosis not present

## 2014-06-28 DIAGNOSIS — E108 Type 1 diabetes mellitus with unspecified complications: Secondary | ICD-10-CM

## 2014-06-28 DIAGNOSIS — H543 Unqualified visual loss, both eyes: Secondary | ICD-10-CM | POA: Diagnosis not present

## 2014-06-28 DIAGNOSIS — I120 Hypertensive chronic kidney disease with stage 5 chronic kidney disease or end stage renal disease: Secondary | ICD-10-CM | POA: Diagnosis not present

## 2014-06-28 DIAGNOSIS — E11319 Type 2 diabetes mellitus with unspecified diabetic retinopathy without macular edema: Secondary | ICD-10-CM | POA: Insufficient documentation

## 2014-06-28 DIAGNOSIS — Z992 Dependence on renal dialysis: Secondary | ICD-10-CM | POA: Insufficient documentation

## 2014-06-28 DIAGNOSIS — R079 Chest pain, unspecified: Secondary | ICD-10-CM | POA: Insufficient documentation

## 2014-06-28 DIAGNOSIS — E1069 Type 1 diabetes mellitus with other specified complication: Secondary | ICD-10-CM

## 2014-06-28 DIAGNOSIS — E7800 Pure hypercholesterolemia, unspecified: Secondary | ICD-10-CM | POA: Diagnosis not present

## 2014-06-28 DIAGNOSIS — R9431 Abnormal electrocardiogram [ECG] [EKG]: Secondary | ICD-10-CM | POA: Diagnosis present

## 2014-06-28 DIAGNOSIS — E109 Type 1 diabetes mellitus without complications: Secondary | ICD-10-CM | POA: Diagnosis not present

## 2014-06-28 DIAGNOSIS — Z794 Long term (current) use of insulin: Secondary | ICD-10-CM | POA: Diagnosis not present

## 2014-06-28 DIAGNOSIS — I10 Essential (primary) hypertension: Secondary | ICD-10-CM

## 2014-06-28 DIAGNOSIS — E0500 Thyrotoxicosis with diffuse goiter without thyrotoxic crisis or storm: Secondary | ICD-10-CM | POA: Insufficient documentation

## 2014-06-28 DIAGNOSIS — N186 End stage renal disease: Secondary | ICD-10-CM | POA: Diagnosis not present

## 2014-06-28 DIAGNOSIS — R51 Headache: Secondary | ICD-10-CM | POA: Insufficient documentation

## 2014-06-28 DIAGNOSIS — E10319 Type 1 diabetes mellitus with unspecified diabetic retinopathy without macular edema: Secondary | ICD-10-CM

## 2014-06-28 DIAGNOSIS — Z79899 Other long term (current) drug therapy: Secondary | ICD-10-CM | POA: Diagnosis not present

## 2014-06-28 DIAGNOSIS — Z86718 Personal history of other venous thrombosis and embolism: Secondary | ICD-10-CM | POA: Insufficient documentation

## 2014-06-28 DIAGNOSIS — E039 Hypothyroidism, unspecified: Secondary | ICD-10-CM | POA: Insufficient documentation

## 2014-06-28 DIAGNOSIS — R011 Cardiac murmur, unspecified: Secondary | ICD-10-CM | POA: Diagnosis not present

## 2014-06-28 DIAGNOSIS — R7309 Other abnormal glucose: Secondary | ICD-10-CM

## 2014-06-28 LAB — CBC
HEMATOCRIT: 32.2 % — AB (ref 36.0–46.0)
Hemoglobin: 11.2 g/dL — ABNORMAL LOW (ref 12.0–15.0)
MCH: 31.3 pg (ref 26.0–34.0)
MCHC: 34.8 g/dL (ref 30.0–36.0)
MCV: 89.9 fL (ref 78.0–100.0)
Platelets: 188 10*3/uL (ref 150–400)
RBC: 3.58 MIL/uL — ABNORMAL LOW (ref 3.87–5.11)
RDW: 14 % (ref 11.5–15.5)
WBC: 4.1 10*3/uL (ref 4.0–10.5)

## 2014-06-28 LAB — BASIC METABOLIC PANEL
Anion gap: 14 (ref 5–15)
BUN: 29 mg/dL — AB (ref 6–23)
CHLORIDE: 96 meq/L (ref 96–112)
CO2: 27 mEq/L (ref 19–32)
Calcium: 8.6 mg/dL (ref 8.4–10.5)
Creatinine, Ser: 6.89 mg/dL — ABNORMAL HIGH (ref 0.50–1.10)
GFR calc Af Amer: 8 mL/min — ABNORMAL LOW (ref >90)
GFR calc non Af Amer: 7 mL/min — ABNORMAL LOW (ref >90)
Glucose, Bld: 125 mg/dL — ABNORMAL HIGH (ref 70–99)
Potassium: 4.6 mEq/L (ref 3.7–5.3)
Sodium: 137 mEq/L (ref 137–147)

## 2014-06-28 LAB — GLUCOSE, CAPILLARY
Glucose-Capillary: 211 mg/dL — ABNORMAL HIGH (ref 70–99)
Glucose-Capillary: 262 mg/dL — ABNORMAL HIGH (ref 70–99)
Glucose-Capillary: 332 mg/dL — ABNORMAL HIGH (ref 70–99)

## 2014-06-28 LAB — TROPONIN I
Troponin I: <0.3 ng/mL (ref <0.30)
Troponin I: <0.3 ng/mL (ref <0.30)

## 2014-06-28 LAB — CBG MONITORING, ED: Glucose-Capillary: 114 mg/dL — ABNORMAL HIGH (ref 70–99)

## 2014-06-28 MED ORDER — INSULIN GLARGINE 100 UNIT/ML ~~LOC~~ SOLN
14.0000 [IU] | Freq: Two times a day (BID) | SUBCUTANEOUS | Status: DC
  Filled 2014-06-28: qty 0.14

## 2014-06-28 MED ORDER — LEVOTHYROXINE SODIUM 100 MCG PO TABS
100.0000 ug | ORAL_TABLET | Freq: Every day | ORAL | Status: DC
  Administered 2014-06-29 – 2014-07-01 (×3): 100 ug via ORAL
  Filled 2014-06-28 (×4): qty 1

## 2014-06-28 MED ORDER — ONDANSETRON HCL 4 MG/2ML IJ SOLN
4.0000 mg | Freq: Four times a day (QID) | INTRAMUSCULAR | Status: DC | PRN
Start: 2014-06-28 — End: 2014-07-01

## 2014-06-28 MED ORDER — LISINOPRIL 40 MG PO TABS
40.0000 mg | ORAL_TABLET | Freq: Every day | ORAL | Status: DC
  Administered 2014-06-28 – 2014-07-01 (×4): 40 mg via ORAL
  Filled 2014-06-28 (×4): qty 1

## 2014-06-28 MED ORDER — INFLUENZA VAC SPLIT QUAD 0.5 ML IM SUSY
0.5000 mL | PREFILLED_SYRINGE | INTRAMUSCULAR | Status: AC
  Administered 2014-06-29: 0.5 mL via INTRAMUSCULAR
  Filled 2014-06-28: qty 0.5

## 2014-06-28 MED ORDER — ASPIRIN EC 81 MG PO TBEC: 81.0000 mg | DELAYED_RELEASE_TABLET | Freq: Every day | ORAL | Status: DC

## 2014-06-28 MED ORDER — ISOSORBIDE MONONITRATE ER 30 MG PO TB24: 30.0000 mg | ORAL_TABLET | Freq: Every day | ORAL | Status: DC

## 2014-06-28 MED ORDER — HYDRALAZINE HCL 10 MG PO TABS
10.0000 mg | ORAL_TABLET | Freq: Three times a day (TID) | ORAL | Status: DC
  Administered 2014-06-28 – 2014-06-29 (×4): 10 mg via ORAL
  Filled 2014-06-28 (×7): qty 1

## 2014-06-28 MED ORDER — ATORVASTATIN CALCIUM 40 MG PO TABS
40.0000 mg | ORAL_TABLET | Freq: Every day | ORAL | Status: DC
  Administered 2014-06-29 – 2014-06-30 (×2): 40 mg via ORAL
  Filled 2014-06-28 (×4): qty 1

## 2014-06-28 MED ORDER — ACETAMINOPHEN 325 MG PO TABS: 650.0000 mg | ORAL_TABLET | ORAL | Status: DC | PRN

## 2014-06-28 MED ORDER — ISOSORBIDE MONONITRATE ER 30 MG PO TB24
30.0000 mg | ORAL_TABLET | Freq: Every day | ORAL | Status: DC
  Administered 2014-06-28 – 2014-07-01 (×4): 30 mg via ORAL
  Filled 2014-06-28 (×4): qty 1

## 2014-06-28 MED ORDER — INSULIN GLARGINE 100 UNIT/ML ~~LOC~~ SOLN
7.0000 [IU] | Freq: Once | SUBCUTANEOUS | Status: AC
  Administered 2014-06-28: 7 [IU] via SUBCUTANEOUS
  Filled 2014-06-28: qty 0.07

## 2014-06-28 MED ORDER — AMLODIPINE BESYLATE 10 MG PO TABS
10.0000 mg | ORAL_TABLET | Freq: Every day | ORAL | Status: DC
  Administered 2014-06-28 – 2014-06-30 (×3): 10 mg via ORAL
  Filled 2014-06-28 (×4): qty 1

## 2014-06-28 MED ORDER — ATORVASTATIN CALCIUM 40 MG PO TABS: 40.0000 mg | ORAL_TABLET | Freq: Every day | ORAL | Status: DC

## 2014-06-28 MED ORDER — INSULIN ASPART 100 UNIT/ML ~~LOC~~ SOLN
0.0000 [IU] | SUBCUTANEOUS | Status: DC
  Administered 2014-06-28: 8 [IU] via SUBCUTANEOUS
  Administered 2014-06-28: 5 [IU] via SUBCUTANEOUS

## 2014-06-28 MED ORDER — ENOXAPARIN SODIUM 30 MG/0.3ML ~~LOC~~ SOLN
30.0000 mg | SUBCUTANEOUS | Status: DC
  Administered 2014-06-28 – 2014-06-30 (×3): 30 mg via SUBCUTANEOUS
  Filled 2014-06-28 (×4): qty 0.3

## 2014-06-28 MED ORDER — GI COCKTAIL ~~LOC~~
30.0000 mL | Freq: Four times a day (QID) | ORAL | Status: DC | PRN
Start: 2014-06-28 — End: 2014-07-01

## 2014-06-28 MED ORDER — HYDRALAZINE HCL 10 MG PO TABS: 10.0000 mg | ORAL_TABLET | Freq: Three times a day (TID) | ORAL | Status: DC

## 2014-06-28 NOTE — Patient Instructions (Signed)
Nice to meet you!

## 2014-06-28 NOTE — Assessment & Plan Note (Addendum)
Blood sugars have been widely variable recently. Will not increase her insulin at this time given her low blood sugars recently. Advised on fluids. She will follow-up with Dr Jarvis Newcomer this Friday. Would consider referral to endocrinology at that time.

## 2014-06-28 NOTE — ED Notes (Signed)
Pt here for chest pain this AM at 0700 until 1000 nonradiating in nature, pt was dialyzed yesterday also reports headache,

## 2014-06-28 NOTE — ED Notes (Signed)
IV attempted x 1 without success.  Ultrasound IV RN will attempt.

## 2014-06-28 NOTE — ED Notes (Signed)
IV team at bedside 

## 2014-06-28 NOTE — ED Provider Notes (Signed)
CSN: [id number]     Arrival date & time 06/28/14  1345 History   First MD Initiated Contact with Patient 06/28/14 1353     Chief Complaint  Patient presents with  . Chest Pain     (Consider location/radiation/quality/duration/timing/severity/associated sxs/prior Treatment) HPI Comments: Patient is a 33 year old female with a past medical history of type 1 diabetes, heart murmur, end-stage renal disease on hemodialysis (T, TH, Sat at [facility]), bilateral blindness and hypothyroidism who presents to the emergency department from her primary care physician's office complaining of chest pain that woke her up from sleep around 7:00 AM today lasting until 10:00 AM and subsiding on its own. Pain located midsternal, described as a pressure feeling, constant and nonradiating. No aggravating or alleviating factors. Denies shortness of breath, nausea, vomiting. She reports intermittent episodes of diaphoresis. States she started to develop a throbbing headache. She completed dialysis yesterday is almost at her goal weight. Admits to increased urinary frequency. Denies dysuria or hematuria. States she's been monitoring her blood pressure which has been "out of whack and all over the place". Admits to compliance with her medication. It is noted an EKG was done at her PCP's office with new t-wave inversions in lead V2 compared to her old EKG from July 2014.  Patient is a 33 y.o. female presenting with chest pain. The history is provided by the patient and medical records.  Chest Pain Associated symptoms: headache     Past Medical History  Diagnosis Date  . 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 Hypothyroidism   . Hypertension   . Type I diabetes mellitus   . DVT (deep venous thrombosis)     "both arms" (11/11/2013)  . High cholesterol   . Diabetic retinopathy associated with type 1 diabetes mellitus   . Heart murmur     "slight" (11/11/2013)  . Graves' disease     "had thyroid  radioactively treated" (11/11/2013)  . End-stage renal disease on hemodialysis     "TTS; [facility]" (11/11/2013)   Past Surgical History  Procedure Laterality Date  . Insertion of dialysis catheter N/A 04/01/2013    Procedure: INSERTION OF DIALYSIS CATHETER;  Surgeon: Pryor Ochoa, MD;  Location: [facility] OR;  Service: Vascular;  Laterality: N/A;  . Av fistula placement Right 04/07/2013    Procedure: INSERTION OF ARTERIOVENOUS (AV) GORE-TEX GRAFT THIGH;  Surgeon: Chuck Hint, MD;  Location: [facility] OR;  Service: Vascular;  Laterality: Right;  . Cataract extraction w/ intraocular lens implant Right 2009  . Eye surgery Right 2009    "laser OR for diabetic retinopathy" (05/23/2013)  . Enucleation Left ~ 2010  . Revision of arteriovenous goretex graft Right 11/11/2013    Procedure: REVISION OF ARTERIOVENOUS GORETEX GRAFT - RIGHT THIGH;  Surgeon: Chuck Hint, MD;  Location: [facility] OR;  Service: Vascular;  Laterality: Right;   Family History  Problem Relation Age of Onset  . Hypertension    . Cancer    . Thyroid disease    . Cancer Mother   . Asthma Father    History  Substance Use Topics  . Smoking status: Never Smoker   . Smokeless tobacco: Never Used  . Alcohol Use: No   OB History   Grav Para Term Preterm Abortions TAB SAB Ect Mult Living                 Review of Systems  Cardiovascular: Positive for chest pain.  Neurological: Positive for headaches.  All other systems reviewed and are negative.     Allergies  Review of patient's allergies indicates no known allergies.  Home Medications   Prior to Admission medications   Medication Sig Start Date End Date Taking? Authorizing Provider  amLODipine (NORVASC) 10 MG tablet Take 10 mg by mouth at bedtime.   Yes Historical Provider, MD  atorvastatin (LIPITOR) 40 MG tablet Take 40 mg by mouth daily.   Yes Historical Provider, MD  hydrALAZINE (APRESOLINE) 10 MG tablet Take 1 tablet (10 mg total) by mouth 3 (three) times  daily. 06/28/14  Yes Glori Luis, MD  insulin aspart (NOVOLOG) 100 UNIT/ML injection Inject 10-15 Units into the skin 3 (three) times daily before meals.   Yes Historical Provider, MD  insulin glargine (LANTUS) 100 UNIT/ML injection Inject 14 Units into the skin 2 (two) times daily.   Yes Historical Provider, MD  isosorbide mononitrate (IMDUR) 30 MG 24 hr tablet Take 1 tablet (30 mg total) by mouth daily. 06/28/14  Yes Glori Luis, MD  levothyroxine (SYNTHROID, LEVOTHROID) 100 MCG tablet Take 100 mcg by mouth daily before breakfast.   Yes Historical Provider, MD  lisinopril (PRINIVIL,ZESTRIL) 40 MG tablet Take 40 mg by mouth daily.   Yes Historical Provider, MD   BP 140/78  Pulse 74  Temp(Src) 98.1 F (36.7 C) (Oral)  Resp 16  Ht  (1.575 m)  Wt 127 lb 4.8 oz (57.743 kg)  BMI 23.28 kg/m2  SpO2 100%  LMP 06/10/2014 Physical Exam  Nursing note and vitals reviewed. Constitutional: She is oriented to person, place, and time. She appears well-developed and well-nourished. No distress.  HENT:  Head: Normocephalic and atraumatic.  Mouth/Throat: Oropharynx is clear and moist.  Eyes: Conjunctivae are normal.  Neck: Normal range of motion. Neck supple. No JVD present.  Cardiovascular: Normal rate, regular rhythm, normal heart sounds and intact distal pulses.   No extremity edema.  Pulmonary/Chest: Effort normal and breath sounds normal. No respiratory distress. She exhibits no tenderness.  Abdominal: Soft. Bowel sounds are normal. There is no tenderness.  Musculoskeletal: Normal range of motion. She exhibits no edema.  Neurological: She is alert and oriented to person, place, and time. She has normal strength. No sensory deficit.  Speech fluent, goal oriented. Moves limbs without ataxia. Equal grip strength bilateral.  Skin: Skin is warm and dry. She is not diaphoretic.  Psychiatric: She has a normal mood and affect. Her behavior is normal.    ED Course  Procedures  (including critical care time) Labs Review Labs Reviewed  CBC - Abnormal; Notable for the following:    RBC 3.58 (*)    Hemoglobin 11.2 (*)    HCT 32.2 (*)    All other components within normal limits  BASIC METABOLIC PANEL - Abnormal; Notable for the following:    Glucose, Bld 125 (*)    BUN 29 (*)    Creatinine, Ser 6.89 (*)    GFR calc non Af Amer 7 (*)    GFR calc Af Amer 8 (*)    All other components within normal limits  HEMOGLOBIN A1C - Abnormal; Notable for the following:    Hemoglobin A1C 7.3 (*)    Mean Plasma Glucose 163 (*)    All other components within normal limits  RENAL FUNCTION PANEL - Abnormal; Notable for the following:    Sodium 134 (*)    Chloride 93 (*)    Glucose, Bld 191 (*)  BUN 41 (*)    Creatinine, Ser 8.36 (*)    Calcium 8.1 (*)    Phosphorus 6.5 (*)    Albumin 3.3 (*)    GFR calc non Af Amer 6 (*)    GFR calc Af Amer 7 (*)    Anion gap 18 (*)    All other components within normal limits  GLUCOSE, CAPILLARY - Abnormal; Notable for the following:    Glucose-Capillary 262 (*)    All other components within normal limits  GLUCOSE, CAPILLARY - Abnormal; Notable for the following:    Glucose-Capillary 211 (*)    All other components within normal limits  GLUCOSE, CAPILLARY - Abnormal; Notable for the following:    Glucose-Capillary 38 (*)    All other components within normal limits  GLUCOSE, CAPILLARY - Abnormal; Notable for the following:    Glucose-Capillary 66 (*)    All other components within normal limits  GLUCOSE, CAPILLARY - Abnormal; Notable for the following:    Glucose-Capillary 204 (*)    All other components within normal limits  GLUCOSE, CAPILLARY - Abnormal; Notable for the following:    Glucose-Capillary 183 (*)    All other components within normal limits  GLUCOSE, CAPILLARY - Abnormal; Notable for the following:    Glucose-Capillary 277 (*)    All other components within normal limits  CBG MONITORING, ED - Abnormal;  Notable for the following:    Glucose-Capillary 114 (*)    All other components within normal limits  TROPONIN I  TROPONIN I  TROPONIN I  TROPONIN I  TSH    Imaging Review Dg Chest 2 View  06/28/2014   CLINICAL DATA:  Chest pain and pressure today, shortness of breath with chest pressure, abnormal EKG, type I diabetes, end-stage renal disease on dialysis  EXAM: CHEST  2 VIEW  COMPARISON:  06/26/2014  FINDINGS: Normal heart size, mediastinal contours, and pulmonary vascularity.  Lungs clear.  No pleural effusion or pneumothorax.  Bones unremarkable.  IMPRESSION: Normal exam.   Electronically Signed   By: Ulyses Southward M.D.   On: 06/28/2014 14:57     EKG Interpretation None      MDM   Final diagnoses:  Atypical chest pain   Pt presenting with chest pain, third visit in 3 days, sent from PCP. She is non-toxic appearing and in NAD. AFVSS. Troponin WNL. Given EKG changes and HEART score of 4, will admit for chest pain obs. Admission accepted by [facility].  Trevor Mace, PA-C 06/29/14 1337

## 2014-06-28 NOTE — Assessment & Plan Note (Addendum)
Not at goal today. With chest pain will add imdur and will add hydralazine for better blood pressure control. If patient is admitted would ask the inpatient team to titrate her blood pressure medications. F/u with PCP to further manage this issue.

## 2014-06-28 NOTE — ED Notes (Signed)
MD at bedside. 

## 2014-06-28 NOTE — Assessment & Plan Note (Addendum)
Patient with typical chest pain this morning in setting of T-wave inversions in V1 and V2 that are new from last EKG. With this chest pain and her risk factors and EKG changes we will have the patient transported to the ED for further evaluation.  Precepted with Dr Randolm Idol and Dr Leveda Anna.

## 2014-06-28 NOTE — Progress Notes (Signed)
Patient ID: [name], female   DOB: 03-21-1981, 32 y.o.   MRN: [id number]  Marikay Alar, MD Phone: [phone]  [name] is a 33 y.o. female who presents today for same day appointment.  Chest pain: patient reports onset of chest pain this morning at 7 am. It was in the center of her chest. It was as though her chest was caving in. It did not radiate. She did have shortness of breath and diaphoresis with this. It got worse once she got out of bed and moved around. She is unsure what made it better as it went away on its own.   DIABETES Disease Monitoring: Blood Sugar ranges-has ranged from 25-470's in the past couple of weeks      Visual problems- is blind Medications: Compliance- is taking lantus 14 u BID and novolog 10-15 u with meals.  Hypoglycemic symptoms- ~2x/wk  HYPERTENSION Disease Monitoring Home BP Monitoring has been 170/100 Chest pain- see above    Dyspnea- see chest pain section Medications Compliance-  Taking lisinopril and amlodipine.  Edema- no Patient notes BP has been elevated over the past 3 weeks. She has been to the ED for this twice in the past 2 days. Her blood pressure was elevated to the 180's/90's in the ED, though the patient states it came down to normal prior to discharge. She was seen yesterday at dialysis and her blood pressure went to the 200's systolic after dialysis. Her nephrologist gave her a prescription for metoprolol, though the patient states the EDP told her not to start this medication due to her recent hypoglycemia.  Patient is a nonsmoker.   ROS: Per HPI   Physical Exam Filed Vitals:   06/28/14 1134  BP: 158/100  Pulse: 85  Temp: 98.5 F (36.9 C)    Gen: Well NAD HEENT:  MMM Lungs: CTABL Nl WOB Heart: RRR 2/6 LUSB murmur no RG Exts: Non edematous BL  LE, warm and well perfused.   EKG: NSR 77, new T-wave inversions in V1 and V2  Assessment/Plan: Please see individual problem list.   Marikay Alar, MD [facility] PGY-3

## 2014-06-28 NOTE — ED Notes (Signed)
Pt states she believes blood sugar is low.  CBG 114.

## 2014-06-28 NOTE — ED Notes (Signed)
Transported to [id number] by Raynelle Fanning, RN.

## 2014-06-28 NOTE — ED Notes (Signed)
IV team notified of pt needing IV access.

## 2014-06-28 NOTE — H&P (Signed)
Family Medicine Teaching [facility] Admission History and Physical Service Pager: [phone]  Patient name: [name] Medical record number: [id number] Date of birth: 1980-10-19 Age: 33 y.o. Gender: female  Primary Care Provider: Hazeline Junker, MD Consultants: none Code Status: Full   Chief Complaint: chest pain   Assessment and Plan: [name] is a 33 y.o. female presenting with chest pain. Found to have new T wave inversions on V2 in clinic. PMH is significant for T1DM with retinopathy causing blindness, nephropathy causing ESRD, on HD since July 2014, UE DVT (no longer on anticoagulation) and hypothyroidism.   #Atypical chest pain: Pain centralized, non radiating and associated with diaphoresis and nausea. No history of pain similar to this. No family history. Not currently having any pain. Pain relived with activity and exacerbated while sitting down. No history of reflux, NSAID use or injury.  istat troponin negative but EKG showing new TWI in V2.  Most likely pain related to reflux or MSK in origin.  - admitted to telemetry, Dr. Mauricio Po attending  - cycle troponins  - TSH and hemoglobin A1c  - EKG AM  - GI cocktail pRN   #Type 1 Dm: most recent Hgb A1c showing good control. She reports having episodes of hypoglycemia earlier this week. Her blood sugars have been widely variable. Has taken morning dose of Lantus today. - CBG's Q4  - Moderate SSI Q4  - Lantus 7 U tonight  - tomorrow will start home Lantus 14 U BID   #ESRD: Has dialysis on Tuesday, Thursday, and Saturday. Has been on dialysis since July of 2014. - Call Renal in AM  - renal function panel AM   #HTN: uncontrolled. Imdur and hydralazine added today at clinic visit. She reports elevated blood pressures for receiving dialysis area she has been seen by her renal doctor but no changes were made to her regimen. - Continue medications  FEN/GI: Carb show seen by PCP modified/saline lock  Prophylaxis:  enoxaparin CrCl <30  Disposition: admitted to family medicine for chest pain observation.   History of Present Illness: [name] is a 33 y.o. female presenting with chest pain. Patient reports onset of chest pain this morning at 7 am. It was in the centralized and non radiating.  She did have shortness of breath and diaphoresis with this. The pain was associated with nausea and vomiting and diaphoresis. It was early was walking around and worsened with sitting down. She's never had pain like this before. She has no family history of any early heart attack or heart disease. She currently does not have any pain. The pain awoke her from sleep at 7 AM but she is able to go back to sleep.   Review Of Systems: Per HPI with the following additions: See HPI  Otherwise 12 point review of systems was performed and was unremarkable.  Patient Active Problem List   Diagnosis Date Noted  . Chest pain 06/28/2014  . Atypical chest pain 06/28/2014  . ESRD (end stage renal disease) on dialysis 11/11/2013  . Unspecified hypothyroidism 06/10/2013  . Arteriovenous graft for hemodialysis in place, primary 05/24/2013  . Diabetic retinopathy 04/21/2013  . End stage renal disease 04/21/2013  . Essential hypertension, benign 04/21/2013  . DVT of upper extremity (deep vein thrombosis) 04/02/2013  . Renal failure 04/02/2013  . DM (diabetes mellitus), type 1 04/02/2013  . Blind    Past Medical History: Past Medical History  Diagnosis Date  . 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 Hypothyroidism   . Hypertension   . Type I diabetes mellitus   . DVT (deep venous thrombosis)     "both arms" (11/11/2013)  . High cholesterol   . Diabetic retinopathy associated with type 1 diabetes mellitus   . Heart murmur     "slight" (11/11/2013)  . Graves' disease     "had thyroid radioactively treated" (11/11/2013)  . End-stage renal disease on hemodialysis     "TTS; [facility]" (11/11/2013)   Past  Surgical History: Past Surgical History  Procedure Laterality Date  . Insertion of dialysis catheter N/A 04/01/2013    Procedure: INSERTION OF DIALYSIS CATHETER;  Surgeon: Pryor Ochoa, MD;  Location: [facility] OR;  Service: Vascular;  Laterality: N/A;  . Av fistula placement Right 04/07/2013    Procedure: INSERTION OF ARTERIOVENOUS (AV) GORE-TEX GRAFT THIGH;  Surgeon: Chuck Hint, MD;  Location: [facility] OR;  Service: Vascular;  Laterality: Right;  . Cataract extraction w/ intraocular lens implant Right 2009  . Eye surgery Right 2009    "laser OR for diabetic retinopathy" (05/23/2013)  . Enucleation Left ~ 2010  . Revision of arteriovenous goretex graft Right 11/11/2013    Procedure: REVISION OF ARTERIOVENOUS GORETEX GRAFT - RIGHT THIGH;  Surgeon: Chuck Hint, MD;  Location: [facility] OR;  Service: Vascular;  Laterality: Right;   Social History: History  Substance Use Topics  . Smoking status: Never Smoker   . Smokeless tobacco: Never Used  . Alcohol Use: No   Additional social history: none Please also refer to relevant sections of EMR.  Family History: Family History  Problem Relation Age of Onset  . Hypertension    . Cancer    . Thyroid disease    . Cancer Mother   . Asthma Father    Allergies and Medications: No Known Allergies No current facility-administered medications on file prior to encounter.   Current Outpatient Prescriptions on File Prior to Encounter  Medication Sig Dispense Refill  . amLODipine (NORVASC) 10 MG tablet Take 10 mg by mouth at bedtime.      . hydrALAZINE (APRESOLINE) 10 MG tablet Take 1 tablet (10 mg total) by mouth 3 (three) times daily.  90 tablet  0  . insulin aspart (NOVOLOG) 100 UNIT/ML injection Inject 10-15 Units into the skin 3 (three) times daily before meals.      . insulin glargine (LANTUS) 100 UNIT/ML injection Inject 14 Units into the skin 2 (two) times daily.      . isosorbide mononitrate (IMDUR) 30 MG 24 hr tablet Take 1 tablet (30 mg  total) by mouth daily.  30 tablet  6  . levothyroxine (SYNTHROID, LEVOTHROID) 100 MCG tablet Take 100 mcg by mouth daily before breakfast.      . lisinopril (PRINIVIL,ZESTRIL) 40 MG tablet Take 40 mg by mouth daily.        Objective: BP 179/93  Pulse 73  Temp(Src) 98.6 F (37 C) (Oral)  Resp 18  Ht  (1.676 m)  Wt 128 lb (58.06 kg)  BMI 20.67 kg/m2  SpO2 100%  LMP 06/10/2014 Exam: General: NAD, Alert, cooperative with exam HEENT: Moist mucous membranes, [place]/AT, R pupil ERRL, muddy R sclera. L eye prosthetic Cardiovascular: S1S2, RRR, LUSB murmur, no r/g  Respiratory: CTAB, no wheezing, no extra effort of breathing  Abdomen: soft, NTND, +BS,  Extremities: warm and well perfused.  Skin: no rashes  Neuro: alert and oriented,   Labs and Imaging: CBC  BMET   Recent Labs Lab 06/26/14 1521  WBC 7.0  HGB 10.5*  HCT 30.4*  PLT 149*    Recent Labs Lab 06/26/14 1827  NA 138  K 5.0  CL 99  CO2 19  BUN 64*  CREATININE 9.26*  GLUCOSE 33*  CALCIUM 8.6     CXR: no active disease  EKG: TWI in V2 (new compared to prior)   Myra Rude, MD 06/28/2014, 3:31 PM PGY-2, [place] [facility] FPTS Intern pager: [phone], text pages welcome

## 2014-06-28 NOTE — ED Notes (Signed)
[place] IV attempted x 2 by Gladys Damme, RN.  Awaiting IV team or EDP to attempt IV

## 2014-06-29 DIAGNOSIS — Z992 Dependence on renal dialysis: Secondary | ICD-10-CM

## 2014-06-29 DIAGNOSIS — I10 Essential (primary) hypertension: Secondary | ICD-10-CM | POA: Diagnosis present

## 2014-06-29 DIAGNOSIS — N186 End stage renal disease: Secondary | ICD-10-CM

## 2014-06-29 DIAGNOSIS — R0789 Other chest pain: Secondary | ICD-10-CM

## 2014-06-29 DIAGNOSIS — R9431 Abnormal electrocardiogram [ECG] [EKG]: Secondary | ICD-10-CM | POA: Diagnosis present

## 2014-06-29 DIAGNOSIS — E11319 Type 2 diabetes mellitus with unspecified diabetic retinopathy without macular edema: Secondary | ICD-10-CM

## 2014-06-29 DIAGNOSIS — E1039 Type 1 diabetes mellitus with other diabetic ophthalmic complication: Secondary | ICD-10-CM

## 2014-06-29 DIAGNOSIS — R079 Chest pain, unspecified: Secondary | ICD-10-CM

## 2014-06-29 LAB — RENAL FUNCTION PANEL
ALBUMIN: 3.3 g/dL — AB (ref 3.5–5.2)
ANION GAP: 18 — AB (ref 5–15)
BUN: 41 mg/dL — ABNORMAL HIGH (ref 6–23)
CHLORIDE: 93 meq/L — AB (ref 96–112)
CO2: 23 mEq/L (ref 19–32)
Calcium: 8.1 mg/dL — ABNORMAL LOW (ref 8.4–10.5)
Creatinine, Ser: 8.36 mg/dL — ABNORMAL HIGH (ref 0.50–1.10)
GFR calc Af Amer: 7 mL/min — ABNORMAL LOW (ref >90)
GFR, EST NON AFRICAN AMERICAN: 6 mL/min — AB (ref >90)
Glucose, Bld: 191 mg/dL — ABNORMAL HIGH (ref 70–99)
POTASSIUM: 4.7 meq/L (ref 3.7–5.3)
Phosphorus: 6.5 mg/dL — ABNORMAL HIGH (ref 2.3–4.6)
Sodium: 134 mEq/L — ABNORMAL LOW (ref 137–147)

## 2014-06-29 LAB — GLUCOSE, CAPILLARY
GLUCOSE-CAPILLARY: 277 mg/dL — AB (ref 70–99)
GLUCOSE-CAPILLARY: 260 mg/dL — AB (ref 70–99)
Glucose-Capillary: 38 mg/dL — CL (ref 70–99)
Glucose-Capillary: 66 mg/dL — ABNORMAL LOW (ref 70–99)
Glucose-Capillary: 204 mg/dL — ABNORMAL HIGH (ref 70–99)
Glucose-Capillary: 183 mg/dL — ABNORMAL HIGH (ref 70–99)
Glucose-Capillary: 206 mg/dL — ABNORMAL HIGH (ref 70–99)

## 2014-06-29 LAB — TROPONIN I: Troponin I: <0.3 ng/mL (ref <0.30)

## 2014-06-29 LAB — HEMOGLOBIN A1C
HEMOGLOBIN A1C: 7.3 % — AB (ref <5.7)
MEAN PLASMA GLUCOSE: 163 mg/dL — AB (ref <117)

## 2014-06-29 LAB — TSH: TSH: 3.49 u[IU]/mL (ref 0.350–4.500)

## 2014-06-29 MED ORDER — DOXERCALCIFEROL 4 MCG/2ML IV SOLN
2.0000 ug | INTRAVENOUS | Status: DC
  Administered 2014-07-01: 2 ug via INTRAVENOUS
  Filled 2014-06-29: qty 2

## 2014-06-29 MED ORDER — PANTOPRAZOLE SODIUM 40 MG PO TBEC
40.0000 mg | DELAYED_RELEASE_TABLET | Freq: Every day | ORAL | Status: DC
  Administered 2014-06-30 – 2014-07-01 (×2): 40 mg via ORAL
  Filled 2014-06-29 (×2): qty 1

## 2014-06-29 MED ORDER — INSULIN GLARGINE 100 UNIT/ML ~~LOC~~ SOLN
7.0000 [IU] | Freq: Two times a day (BID) | SUBCUTANEOUS | Status: DC
  Administered 2014-06-29: 7 [IU] via SUBCUTANEOUS
  Filled 2014-06-29 (×2): qty 0.07

## 2014-06-29 MED ORDER — INSULIN GLARGINE 100 UNIT/ML ~~LOC~~ SOLN: 7.0000 [IU] | Freq: Two times a day (BID) | SUBCUTANEOUS | Status: DC

## 2014-06-29 MED ORDER — METOPROLOL TARTRATE 12.5 MG HALF TABLET
12.5000 mg | ORAL_TABLET | Freq: Two times a day (BID) | ORAL | Status: DC
  Administered 2014-06-29: 12.5 mg via ORAL
  Filled 2014-06-29 (×3): qty 1

## 2014-06-29 MED ORDER — RENA-VITE PO TABS
1.0000 | ORAL_TABLET | Freq: Every day | ORAL | Status: DC
  Administered 2014-06-29 – 2014-06-30 (×2): 1 via ORAL
  Filled 2014-06-29 (×3): qty 1

## 2014-06-29 MED ORDER — GLUCOSE 40 % PO GEL
ORAL | Status: AC
  Filled 2014-06-29: qty 1

## 2014-06-29 MED ORDER — SODIUM CHLORIDE 0.9 % IV SOLN: 62.5000 mg | INTRAVENOUS | Status: DC

## 2014-06-29 MED ORDER — ASPIRIN 81 MG PO CHEW
81.0000 mg | CHEWABLE_TABLET | Freq: Every day | ORAL | Status: DC
  Administered 2014-06-29 – 2014-07-01 (×3): 81 mg via ORAL
  Filled 2014-06-29 (×3): qty 1

## 2014-06-29 MED ORDER — INSULIN ASPART 100 UNIT/ML ~~LOC~~ SOLN
0.0000 [IU] | Freq: Three times a day (TID) | SUBCUTANEOUS | Status: DC
  Administered 2014-06-29: 2 [IU] via SUBCUTANEOUS
  Administered 2014-06-29 (×2): 5 [IU] via SUBCUTANEOUS
  Administered 2014-06-30: 9 [IU] via SUBCUTANEOUS
  Administered 2014-06-30: 11 [IU] via SUBCUTANEOUS
  Administered 2014-07-01: 9 [IU] via SUBCUTANEOUS
  Administered 2014-07-01: 3 [IU] via SUBCUTANEOUS

## 2014-06-29 MED ORDER — GLUCOSE 40 % PO GEL
1.0000 | ORAL | Status: DC | PRN
  Administered 2014-06-29: 37.5 g via ORAL

## 2014-06-29 NOTE — Progress Notes (Signed)
Utilization review completed. Jaidyn Usery, RN, BSN. 

## 2014-06-29 NOTE — ED Provider Notes (Signed)
Medical screening examination/treatment/procedure(s) were performed by non-physician practitioner and as supervising physician I was immediately available for consultation/collaboration.   Surah Pelley L Waverly Chavarria, MD 06/29/14 1502 

## 2014-06-29 NOTE — Progress Notes (Signed)
Reason for Consult:   Chest pain  Requesting Physician: [facility]  HPI: This is a 33 y.o. female with a past medical history significant for Type 1 IDDM with ESRD, labile HTN, and blindness. She tells me her B/P has been very labile the last week or so. Usually running high after HD. She presented to the hospital with complaints of mid sternal chest pain described as "severe" Onset of her symptoms was Wed am. She describes mid sternal discomfort associated with nausea, diaphoresis, and SOB that last about 3 hours. She says the pain did not radiate to her neck, arm, or back. She has had some recurrent discomfort in the hospital, though not as severe.   PMHx:  Past Medical History  Diagnosis Date  . 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 Hypothyroidism   . Hypertension   . Type I diabetes mellitus   . DVT (deep venous thrombosis)     "both arms" (11/11/2013)  . High cholesterol   . Diabetic retinopathy associated with type 1 diabetes mellitus   . Heart murmur     "slight" (11/11/2013)  . Graves' disease     "had thyroid radioactively treated" (11/11/2013)  . End-stage renal disease on hemodialysis     "TTS; [facility]" (11/11/2013)    Past Surgical History  Procedure Laterality Date  . Insertion of dialysis catheter N/A 04/01/2013    Procedure: INSERTION OF DIALYSIS CATHETER;  Surgeon: Pryor Ochoa, MD;  Location: [facility] OR;  Service: Vascular;  Laterality: N/A;  . Av fistula placement Right 04/07/2013    Procedure: INSERTION OF ARTERIOVENOUS (AV) GORE-TEX GRAFT THIGH;  Surgeon: Chuck Hint, MD;  Location: [facility] OR;  Service: Vascular;  Laterality: Right;  . Cataract extraction w/ intraocular lens implant Right 2009  . Eye surgery Right 2009    "laser OR for diabetic retinopathy" (05/23/2013)  . Enucleation Left ~ 2010  . Revision of arteriovenous goretex graft Right 11/11/2013    Procedure: REVISION OF ARTERIOVENOUS GORETEX GRAFT - RIGHT THIGH;   Surgeon: Chuck Hint, MD;  Location[facility] OR;  Service: Vascular;  Laterality: Right;    SOCHx:  reports that she has never smoked. She has never used smokeless tobacco. She reports that she does not drink alcohol or use illicit drugs.  FAMHx: Family History  Problem Relation Age of Onset  . Hypertension    . Cancer    . Thyroid disease    . Cancer Mother   . Asthma Father     ALLERGIES: No Known Allergies  ROS: Pertinent items are noted in HPI. See H&P for complete ROS. She denies prior cardiac testing.  She denies peripheral neuropathy  HOME MEDICATIONS: Prior to Admission medications   Medication Sig Start Date End Date Taking? Authorizing Provider  amLODipine (NORVASC) 10 MG tablet Take 10 mg by mouth at bedtime.   Yes Historical Provider, MD  atorvastatin (LIPITOR) 40 MG tablet Take 40 mg by mouth daily.   Yes Historical Provider, MD  hydrALAZINE (APRESOLINE) 10 MG tablet Take 1 tablet (10 mg total) by mouth 3 (three) times daily. 06/28/14  Yes Glori Luis, MD  insulin aspart (NOVOLOG) 100 UNIT/ML injection Inject 10-15 Units into the skin 3 (three) times daily before meals.   Yes Historical Provider, MD  insulin glargine (LANTUS) 100 UNIT/ML injection Inject 14 Units into the skin 2 (two) times daily.   Yes Historical Provider, MD  isosorbide mononitrate (IMDUR) 30 MG 24 hr tablet Take 1 tablet (30 mg total) by mouth daily. 06/28/14  Yes Glori Luis, MD  levothyroxine (SYNTHROID, LEVOTHROID) 100 MCG tablet Take 100 mcg by mouth daily before breakfast.   Yes Historical Provider, MD  lisinopril (PRINIVIL,ZESTRIL) 40 MG tablet Take 40 mg by mouth daily.   Yes Historical Provider, MD    HOSPITAL MEDICATIONS: I have reviewed the patient's current medications.  VITALS: Blood pressure 156/80, pulse 82, temperature 98.2 F (36.8 C), temperature source Oral, resp. rate 16, height  (1.575 m), weight 127 lb 4.8 oz (57.743 kg), last menstrual period  06/10/2014, SpO2 100.00%.  PHYSICAL EXAM: General appearance: alert, cooperative, no distress Heent: Blind, OS prosthesis Neck: no carotid bruit and no JVD Lungs: clear to auscultation bilaterally Heart: regular rate and rhythm and 2/6 systolic murmur LSB, +S4 Abdomen: soft, non-tender; bowel sounds normal; no masses,  no organomegaly Extremities: extremities normal, atraumatic, no cyanosis or edema Pulses: 2+ and symmetric Skin: Skin color, texture, turgor normal. No rashes or lesions Neurologic: Grossly normal  LABS: Results for orders placed during the hospital encounter of 06/28/14 (from the past 24 hour(s))  CBG MONITORING, ED     Status: Abnormal   Collection Time    06/28/14  5:04 PM      Result Value Ref Range   Glucose-Capillary 114 (*) 70 - 99 mg/dL  TROPONIN I     Status: None   Collection Time    06/28/14  6:48 PM      Result Value Ref Range   Troponin I <0.30  <0.30 ng/mL  GLUCOSE, CAPILLARY     Status: Abnormal   Collection Time    06/28/14  8:35 PM      Result Value Ref Range   Glucose-Capillary 262 (*) 70 - 99 mg/dL  TROPONIN I     Status: None   Collection Time    06/28/14 11:09 PM      Result Value Ref Range   Troponin I <0.30  <0.30 ng/mL  GLUCOSE, CAPILLARY     Status: Abnormal   Collection Time    06/28/14 11:50 PM      Result Value Ref Range   Glucose-Capillary 211 (*) 70 - 99 mg/dL  GLUCOSE, CAPILLARY     Status: Abnormal   Collection Time    06/29/14  2:28 AM      Result Value Ref Range   Glucose-Capillary 38 (*) 70 - 99 mg/dL   Comment 1 Documented in Chart    GLUCOSE, CAPILLARY     Status: Abnormal   Collection Time    06/29/14  2:51 AM      Result Value Ref Range   Glucose-Capillary 66 (*) 70 - 99 mg/dL  GLUCOSE, CAPILLARY     Status: Abnormal   Collection Time    06/29/14  5:00 AM      Result Value Ref Range   Glucose-Capillary 204 (*) 70 - 99 mg/dL  TROPONIN I     Status: None   Collection Time    06/29/14  5:45 AM      Result  Value Ref Range   Troponin I <0.30  <0.30 ng/mL  TSH     Status: None   Collection Time    06/29/14  5:45 AM      Result Value Ref Range   TSH 3.490  0.350 - 4.500 uIU/mL  HEMOGLOBIN A1C     Status: Abnormal   Collection Time  06/29/14  5:45 AM      Result Value Ref Range   Hemoglobin A1C 7.3 (*) <5.7 %   Mean Plasma Glucose 163 (*) <117 mg/dL  RENAL FUNCTION PANEL     Status: Abnormal   Collection Time    06/29/14  5:45 AM      Result Value Ref Range   Sodium 134 (*) 137 - 147 mEq/L   Potassium 4.7  3.7 - 5.3 mEq/L   Chloride 93 (*) 96 - 112 mEq/L   CO2 23  19 - 32 mEq/L   Glucose, Bld 191 (*) 70 - 99 mg/dL   BUN 41 (*) 6 - 23 mg/dL   Creatinine, Ser 9.60 (*) 0.50 - 1.10 mg/dL   Calcium 8.1 (*) 8.4 - 10.5 mg/dL   Phosphorus 6.5 (*) 2.3 - 4.6 mg/dL   Albumin 3.3 (*) 3.5 - 5.2 g/dL   GFR calc non Af Amer 6 (*) >90 mL/min   GFR calc Af Amer 7 (*) >90 mL/min   Anion gap 18 (*) 5 - 15  GLUCOSE, CAPILLARY     Status: Abnormal   Collection Time    06/29/14  7:35 AM      Result Value Ref Range   Glucose-Capillary 183 (*) 70 - 99 mg/dL  GLUCOSE, CAPILLARY     Status: Abnormal   Collection Time    06/29/14 11:44 AM      Result Value Ref Range   Glucose-Capillary 277 (*) 70 - 99 mg/dL  GLUCOSE, CAPILLARY     Status: Abnormal   Collection Time    06/29/14  4:23 PM      Result Value Ref Range   Glucose-Capillary 260 (*) 70 - 99 mg/dL    EKG: NSR, Q V2 with TWI V2  IMAGING: Dg Chest 2 View  06/28/2014   CLINICAL DATA:  Chest pain and pressure today, shortness of breath with chest pressure, abnormal EKG, type I diabetes, end-stage renal disease on dialysis  EXAM: CHEST  2 VIEW  COMPARISON:  06/26/2014  FINDINGS: Normal heart size, mediastinal contours, and pulmonary vascularity.  Lungs clear.  No pleural effusion or pneumothorax.  Bones unremarkable.  IMPRESSION: Normal exam.   Electronically Signed   By: Ulyses Southward M.D.   On: 06/28/2014 14:57    IMPRESSION: Principal  Problem:   Chest pain with moderate risk of acute coronary syndrome Active Problems:   Abnormal EKG   DM (diabetes mellitus), type 1   ESRD (end stage renal disease) on dialysis   HTN (hypertension), malignant   Abnormal EKG   Diabetic retinopathy-Blind   RECOMMENDATION: MD to see. She needs an echo and possibly coronary angiogram. 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 Consider adding ASA 81 mg, a PPI, and a low dose beta blocker.  Time Spent Directly with Patient: 40 minutes  Abelino Derrick [phone] beeper 06/29/2014, 4:51 PM   I have seen and examined the patient along with Abelino Derrick, PA.  I have reviewed the chart, notes and new data.  I agree with PA's note.  Key new complaints: Her symptoms are quite concerning for possible angina pectoris, but she's currently pain-free. She has had 2 episode of chest pressure associated with diaphoresis at rest. Key examination changes: No evidence of congestive heart failure, no arrhythmia Key new findings / data: Low risk biochemical markers and low risk ECG (I think the isolated inverted T wave in lead V2 is related to lead  placement).  PLAN: I agree that she has numerous coronary risk factors and she could have multivessel CAD. She has missed hemodialysis today and will need to have it tomorrow. Thankfully, she still has good urine output and volume retention is usually not a problem. However in the absence of ongoing symptoms and with low risk biochemical markers and ECG I think a nuclear perfusion study is a good first step. She understands that if the perfusion study is abnormal she'll then need cardiac catheterization, and that due to time constraints this probably wouldn't happen until early next week.  Thurmon Fair, MD, [facility] [phone] 06/29/2014, 6:06 PM

## 2014-06-29 NOTE — H&P (Signed)
FMTS Attending Admit Note Patient seen and examined by me on the day of admission (Sept 23, 2015), discussed with admitting resident and I agree with Dr Jordan Likes' admission note as documented. Patient chest pain-free at time of my visit. T wave inversions in lead V2, coupled with pain and associated risk factors in clinic, prompted triage to ED.  Thus far her Troponins have been negative.  Agree with plan for rule-out, risk stratification.  DM control. Cardiology assessment to determine degree of further cardiac testing that is warranted, either before discharge or as an outpatient.  Paula Compton, MD

## 2014-06-29 NOTE — Progress Notes (Signed)
Inpatient Diabetes Program Recommendations  AACE/ADA: New Consensus Statement on Inpatient Glycemic Control (2013)  Target Ranges:  Prepandial:   less than 140 mg/dL      Peak postprandial:   less than 180 mg/dL (1-2 hours)      Critically ill patients:  140 - 180 mg/dL   Inpatient Diabetes Program Recommendations Insulin - Meal Coverage: consider adding Novolog 2 units TID with meals per Glycemic Control Order Set Thank you  Piedad Climes BSN, RN,CDE Inpatient Diabetes Coordinator [phone] (team pager)

## 2014-06-29 NOTE — Progress Notes (Signed)
Family Medicine Teaching Service Daily Progress Note Intern Pager: [phone]  Patient name: [name] Medical record number: [id number] Date of birth: 03-Jun-1981 Age: 33 y.o. Gender: female  Primary Care Provider: Hazeline Junker, MD Consultants: None Code Status: Full  Assessment and Plan: 33 y.o. female presenting with chest pain with new T wave inversions on V2 in clinic. PMH is significant for T1DM with retinopathy causing blindness, nephropathy causing ESRD, on HD since July 2014, UE DVT (no longer on anticoagulation) and hypothyroidism.   #Atypical chest pain: Most likely related to reflux or MSK in origin.  - Troponins <0.3 x3 - TSH- 3.49 - am EKG pending  - GI cocktail pRN   #Type 1 Dm: reports having episodes of hypoglycemia earlier this week. Her blood sugars have been widely variable.  - A1C- 7.3 - BG ranging from 66-262 during hospitalization thus far - Senistive SSI  - Lantus 7 U last night and will transition to Lantus 7U BID today  #ESRD: dialysis on Tuesday, Thursday, and Saturday.  - Contact Renal today - BMP: Cr 8.36, BUN 41  #HTN: 140-183 / 78-105 - Last measured: 140/78  - Amlodipine, Hydralazine, Imdur, Lisinopril   FEN/GI: Renal Carb Modified; Saline lock  Prophylaxis: enoxaparin CrCl <30  Disposition: Admitted to [facility] Medicine Teaching Service.  Subjective:  Complaint of hypoglycemia overnight.  States today that initial chest pain has resolved.  States she is now having intermittent pain from LUQ to RUQ that pulses and lasts about five minutes before going away.  States initial pain felt like a pressure drilling into the middle of her chest with sweating and generalized weakness.  No further complaints today.  Objective: Temp:  [98.1 F (36.7 C)-98.9 F (37.2 C)] 98.1 F (36.7 C) (09/24 0549) Pulse Rate:  [72-85] 74 (09/24 0549) Resp:  [16-18] 16 (09/23 1729) BP: (140-183)/(78-105) 140/78 mmHg (09/24 0549) SpO2:  [98 %-100 %] 100 % (09/24  0549) Weight:  [127 lb 4.8 oz (57.743 kg)-128 lb (58.06 kg)] 127 lb 4.8 oz (57.743 kg) (09/23 1729) Physical Exam: General: 32yo female resting comfortably in no apparent distress Cardiovascular: S1 and S2 noted. Systolic murmur noted at left sternal border. Regular rate and rhythm Respiratory: Clear to auscultation bilaterally.  No wheezing noted.  No increased work of breathing. Abdomen: Bowel sounds noted. Soft and nondistended. No tenderness or masses to palpation. Extremities: No edema noted.  Laboratory:  Recent Labs Lab 06/26/14 1521 06/28/14 1413  WBC 7.0 4.1  HGB 10.5* 11.2*  HCT 30.4* 32.2*  PLT 149* 188    Recent Labs Lab 06/26/14 1827 06/28/14 1413 06/29/14 0545  NA 138 137 134*  K 5.0 4.6 4.7  CL 99 96 93*  CO2 BUN 64* 29* 41*  CREATININE 9.26* 6.89* 8.36*  CALCIUM 8.6 8.6 8.1*  GLUCOSE 33* 125* 191*   Troponin <0.3 x3 TSH 3.49  Graylon Gunning, DO 06/29/2014, 8:25 AM PGY-1, [facility] Family Medicine FPTS Intern pager: [phone], text pages welcome

## 2014-06-29 NOTE — Consult Note (Signed)
[facility] Renal Consultation Note  Indication for Consultation:  Management of ESRD/hemodialysis; anemia, hypertension/volume and secondary hyperparathyroidism  HPI: [name] is a 33 y.o. female admitted with chest pain. Went to her PCP yesterday co chest pain , Headache, nausea ,"after my abnormal ekg he sent me to the ER for admit. Last HD at [facility] center was Tuesaday on schedule with HTN and Headache at end of tx . Using R fem Avgg without problems. Denies Sob, v/d, fever, chills, abdominal discmfort.        Past Medical History  Diagnosis Date  . 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 Hypothyroidism   . Hypertension   . Type I diabetes mellitus   . DVT (deep venous thrombosis)     "both arms" (11/11/2013)  . High cholesterol   . Diabetic retinopathy associated with type 1 diabetes mellitus   . Heart murmur     "slight" (11/11/2013)  . Graves' disease     "had thyroid radioactively treated" (11/11/2013)  . End-stage renal disease on hemodialysis     "TTS; [place] [facility]" (11/11/2013)    Past Surgical History  Procedure Laterality Date  . Insertion of dialysis catheter N/A 04/01/2013    Procedure: INSERTION OF DIALYSIS CATHETER;  Surgeon: Mal Misty, MD;  Location: [facility];  Service: Vascular;  Laterality: N/A;  . Av fistula placement Right 04/07/2013    Procedure: INSERTION OF ARTERIOVENOUS (AV) GORE-TEX GRAFT THIGH;  Surgeon: Angelia Mould, MD;  Location: [facility] OR;  Service: Vascular;  Laterality: Right;  . Cataract extraction w/ intraocular lens implant Right 2009  . Eye surgery Right 2009    "laser OR for diabetic retinopathy" (05/23/2013)  . Enucleation Left ~ 2010  . Revision of arteriovenous goretex graft Right 11/11/2013    Procedure: REVISION OF ARTERIOVENOUS GORETEX GRAFT - RIGHT THIGH;  Surgeon: Angelia Mould, MD;  Location: [facility] OR;  Service: Vascular;  Laterality: Right;      Family History  Problem Relation Age of  Onset  . Hypertension    . Cancer    . Thyroid disease    . Cancer Mother   . Asthma Father   social= Lives with friend and    reports that she has never smoked. She has never used smokeless tobacco. She reports that she does not drink alcohol or use illicit drugs.  No Known Allergies  Prior to Admission medications   Medication Sig Start Date End Date Taking? Authorizing Provider  amLODipine (NORVASC) 10 MG tablet Take 10 mg by mouth at bedtime.   Yes Historical Provider, MD  atorvastatin (LIPITOR) 40 MG tablet Take 40 mg by mouth daily.   Yes Historical Provider, MD  hydrALAZINE (APRESOLINE) 10 MG tablet Take 1 tablet (10 mg total) by mouth 3 (three) times daily. 06/28/14  Yes Leone Haven, MD  insulin aspart (NOVOLOG) 100 UNIT/ML injection Inject 10-15 Units into the skin 3 (three) times daily before meals.   Yes Historical Provider, MD  insulin glargine (LANTUS) 100 UNIT/ML injection Inject 14 Units into the skin 2 (two) times daily.   Yes Historical Provider, MD  isosorbide mononitrate (IMDUR) 30 MG 24 hr tablet Take 1 tablet (30 mg total) by mouth daily. 06/28/14  Yes Leone Haven, MD  levothyroxine (SYNTHROID, LEVOTHROID) 100 MCG tablet Take 100 mcg by mouth daily before breakfast.   Yes Historical Provider, MD  lisinopril (PRINIVIL,ZESTRIL) 40 MG tablet Take 40 mg by  mouth daily.   Yes Historical Provider, MD      Results for orders placed during the hospital encounter of 06/28/14 (from the past 48 hour(s))  CBC     Status: Abnormal   Collection Time    06/28/14  2:13 PM      Result Value Ref Range   WBC 4.1  4.0 - 10.5 K/uL   RBC 3.58 (*) 3.87 - 5.11 MIL/uL   Hemoglobin 11.2 (*) 12.0 - 15.0 g/dL   HCT 32.2 (*) 36.0 - 46.0 %   MCV 89.9  78.0 - 100.0 fL   MCH 31.3  26.0 - 34.0 pg   MCHC 34.8  30.0 - 36.0 g/dL   RDW 14.0  11.5 - 15.5 %   Platelets 188  150 - 400 K/uL  BASIC METABOLIC PANEL     Status: Abnormal   Collection Time    06/28/14  2:13 PM      Result  Value Ref Range   Sodium 137  137 - 147 mEq/L   Potassium 4.6  3.7 - 5.3 mEq/L   Chloride 96  96 - 112 mEq/L   CO2 27  19 - 32 mEq/L   Glucose, Bld 125 (*) 70 - 99 mg/dL   BUN 29 (*) 6 - 23 mg/dL   Creatinine, Ser 6.89 (*) 0.50 - 1.10 mg/dL   Calcium 8.6  8.4 - 10.5 mg/dL   GFR calc non Af Amer 7 (*) >90 mL/min   GFR calc Af Amer 8 (*) >90 mL/min   Comment: (NOTE)     The eGFR has been calculated using the CKD EPI equation.     This calculation has not been validated in all clinical situations.     eGFR's persistently <90 mL/min signify possible Chronic Kidney     Disease.   Anion gap 14  5 - 15  TROPONIN I     Status: None   Collection Time    06/28/14  3:09 PM      Result Value Ref Range   Troponin I <0.30  <0.30 ng/mL   Comment:            Due to the release kinetics of cTnI,     a negative result within the first hours     of the onset of symptoms does not rule out     myocardial infarction with certainty.     If myocardial infarction is still suspected,     repeat the test at appropriate intervals.  CBG MONITORING, ED     Status: Abnormal   Collection Time    06/28/14  5:04 PM      Result Value Ref Range   Glucose-Capillary 114 (*) 70 - 99 mg/dL  TROPONIN I     Status: None   Collection Time    06/28/14  6:48 PM      Result Value Ref Range   Troponin I <0.30  <0.30 ng/mL   Comment:            Due to the release kinetics of cTnI,     a negative result within the first hours     of the onset of symptoms does not rule out     myocardial infarction with certainty.     If myocardial infarction is still suspected,     repeat the test at appropriate intervals.  GLUCOSE, CAPILLARY     Status: Abnormal   Collection Time    06/28/14  8:35 PM  Result Value Ref Range   Glucose-Capillary 262 (*) 70 - 99 mg/dL  TROPONIN I     Status: None   Collection Time    06/28/14 11:09 PM      Result Value Ref Range   Troponin I <0.30  <0.30 ng/mL   Comment:            Due to  the release kinetics of cTnI,     a negative result within the first hours     of the onset of symptoms does not rule out     myocardial infarction with certainty.     If myocardial infarction is still suspected,     repeat the test at appropriate intervals.  GLUCOSE, CAPILLARY     Status: Abnormal   Collection Time    06/28/14 11:50 PM      Result Value Ref Range   Glucose-Capillary 211 (*) 70 - 99 mg/dL  GLUCOSE, CAPILLARY     Status: Abnormal   Collection Time    06/29/14  2:28 AM      Result Value Ref Range   Glucose-Capillary 38 (*) 70 - 99 mg/dL   Comment 1 Documented in Chart    GLUCOSE, CAPILLARY     Status: Abnormal   Collection Time    06/29/14  2:51 AM      Result Value Ref Range   Glucose-Capillary 66 (*) 70 - 99 mg/dL  GLUCOSE, CAPILLARY     Status: Abnormal   Collection Time    06/29/14  5:00 AM      Result Value Ref Range   Glucose-Capillary 204 (*) 70 - 99 mg/dL  TROPONIN I     Status: None   Collection Time    06/29/14  5:45 AM      Result Value Ref Range   Troponin I <0.30  <0.30 ng/mL   Comment:            Due to the release kinetics of cTnI,     a negative result within the first hours     of the onset of symptoms does not rule out     myocardial infarction with certainty.     If myocardial infarction is still suspected,     repeat the test at appropriate intervals.  TSH     Status: None   Collection Time    06/29/14  5:45 AM      Result Value Ref Range   TSH 3.490  0.350 - 4.500 uIU/mL  HEMOGLOBIN A1C     Status: Abnormal   Collection Time    06/29/14  5:45 AM      Result Value Ref Range   Hemoglobin A1C 7.3 (*) <5.7 %   Comment: (NOTE)                                                                               According to the ADA Clinical Practice Recommendations for 2011, when     HbA1c is used as a screening test:      >=6.5%   Diagnostic of Diabetes Mellitus               (if abnormal result is  confirmed)     5.7-6.4%   Increased risk  of developing Diabetes Mellitus     References:Diagnosis and Classification of Diabetes Mellitus,Diabetes     OVFI,4332,95(JOACZ 1):S62-S69 and Standards of Medical Care in             Diabetes - 2011,Diabetes YSAY,3016,01 (Suppl 1):S11-S61.   Mean Plasma Glucose 163 (*) <117 mg/dL   Comment: Performed at [facility]  RENAL FUNCTION PANEL     Status: Abnormal   Collection Time    06/29/14  5:45 AM      Result Value Ref Range   Sodium 134 (*) 137 - 147 mEq/L   Potassium 4.7  3.7 - 5.3 mEq/L   Chloride 93 (*) 96 - 112 mEq/L   CO2 23  19 - 32 mEq/L   Glucose, Bld 191 (*) 70 - 99 mg/dL   BUN 41 (*) 6 - 23 mg/dL   Creatinine, Ser 8.36 (*) 0.50 - 1.10 mg/dL   Calcium 8.1 (*) 8.4 - 10.5 mg/dL   Phosphorus 6.5 (*) 2.3 - 4.6 mg/dL   Albumin 3.3 (*) 3.5 - 5.2 g/dL   GFR calc non Af Amer 6 (*) >90 mL/min   GFR calc Af Amer 7 (*) >90 mL/min   Comment: (NOTE)     The eGFR has been calculated using the CKD EPI equation.     This calculation has not been validated in all clinical situations.     eGFR's persistently <90 mL/min signify possible Chronic Kidney     Disease.   Anion gap 18 (*) 5 - 15  GLUCOSE, CAPILLARY     Status: Abnormal   Collection Time    06/29/14  7:35 AM      Result Value Ref Range   Glucose-Capillary 183 (*) 70 - 99 mg/dL  GLUCOSE, CAPILLARY     Status: Abnormal   Collection Time    06/29/14 11:44 AM      Result Value Ref Range   Glucose-Capillary 277 (*) 70 - 99 mg/dL     ROS see hpi for positives   Physical Exam: Filed Vitals:   06/29/14 1412  BP: 156/80  Pulse: 82  Temp: 98.2 F (36.8 C)  Resp: 16     General: alert thin bf, nad, OX3,  HEENT: Benjamin Perez, MMM, nonicteric Neck: no jvd Heart: RRR,no rub, mur, or gallop Lungs: CTA bilat Abdomen: BS pos. ,soft, nontender, nondistended Extremities: no pedal edema Skin: no overt rash, warm  Neuro: moves all extrm. No focal acute changes Dialysis Access: R fem avgg pos. bruit  Dialysis Orders: [facility]  on TTS . EDW 59.5 HD Bath 2.o k. 2.5 ca  Time 4hr Heparin 3000. Access R fem avgg     0 hectorol mcg IV/HD / Aranesp 60mcg   q thurs hd Venofer  $Remove'50mg'HEkIBaO$  q weekly hd    Assessment/Plan 1. Chest Pain- Cardiology seeing now  1st set CE neg 2. ESRD -  TTS HD normal Schedule Labs this evening okay and vol okay /hd tomor 3. Hypertension/volume  - vol appears okay / current bp  Better than op on med's= amlodipine 10 mg qday/ Hydralazine $RemoveBeforeDEI'10mg'uPvLjvvQMHusrCcM$  tid/ Imdur $RemoveBe'30mg'ejDRAjJId$   Qd, metoprolol 12.$RemoveBeforeDEI'5mg'KFTmdHndrDutZyVV$  bid. Lisinopril $RemoveBeforeD'40mg'NwrhsHSKhktGaf$  hs / fu up trend on hd may need to taper some meds down if bp drops on hd / but below edw now with cxr no volume/  4. Anemia  -  hgb 11.2 hold esa / continue weekly fe on hd 5. Metabolic bone  disease -  Not on outpt binders with last phos 3.6/ ipth 502 (no vit d) start low dose vit d on hd 6. IDDM- per admit team  hgb a1c  7.3 7. Ho Graves disease  Ernest Haber, PA-C [place] [phone] 06/29/2014, 3:38 PM   Pt seen, examined and agree w A/P as above.  Kelly Splinter MD pager [phone]    cell [phone] 06/29/2014, 5:42 PM

## 2014-06-29 NOTE — ED Provider Notes (Signed)
I saw and evaluated the patient, reviewed the resident's note and I agree with the findings and plan.  Patient with history of IDDM, ESRD on HD, blindness.  She presents with complaints of erratic blood pressures and blood sugars for the past several weeks.  She was seen yesterday evening and does not seem satisfied with the explanation she was offered.    On exam, vitals are stable and the patient is afebrile.  Head is AT, [place].  Neck is supple.  Heart is rrr and lungs are clear.  Abdomen is benign.  Extremities are without edema.  Neurologic exam is nonfocal.  Workup reveals no significant abnormalities.  Labs were reviewed from yesterday's workup and are unremarkable and I do not feel need to be repeated.  She is normotensive and blood sugars are not significantly elevated.  I see nothing emergent.  I have advised her to keep a record of her blood sugars and pressures to take with her to her follow up appointment.  I see nothing that appears emergent and believe she is appropriate for discharge.   EKG Interpretation None       Geoffery Lyons, MD 06/29/14 0110

## 2014-06-29 NOTE — Progress Notes (Signed)
Hypoglycemic Event  CBG: 38  Treatment: 1 tube instant glucose, and apple juice  Symptoms: Sweaty, shaky   Follow-up CBG: Time:0251 CBG Result:66  Possible Reasons for Event: Medication regimen: now on novolog SSI while in the hospital  Comments/MD notified: MD Jordan Likes on call notified.  No additional interventions required at this time.  New orders received for SSI.  Will continue to monitor patient closely.    Remus Blake  Remember to initiate Hypoglycemia Order Set & complete

## 2014-06-29 NOTE — Progress Notes (Signed)
FMTS Attending Note Patient seen and examined by me, discussed with resident team and I agree with Dr Richarda Overlie note for today. Patient continues with intermittent episodes (brief) of chest pressure, which tend to originate under her left breast but may radiate to R side of chest. Denies jaw claudication or arm pain/pressure.  She notes some flushing/diaphoresis with the sensation.  Has had recorded episodes of hypoglycemia and a marked decrease in her Hgb A1C of late, raising the possibility that hypoglycemia may play a role. Cardiac workup negative thus far on admission. For Cardiology evaluation to determine what-- if any-- further workup should be done. Plan to contact Nephrology service for HD as needed during admission. Paula Compton, MD

## 2014-06-30 ENCOUNTER — Ambulatory Visit: Payer: 59 | Admitting: Family Medicine

## 2014-06-30 ENCOUNTER — Other Ambulatory Visit (HOSPITAL_COMMUNITY): Payer: 59

## 2014-06-30 ENCOUNTER — Inpatient Hospital Stay (HOSPITAL_COMMUNITY): Payer: Commercial Managed Care - PPO

## 2014-06-30 DIAGNOSIS — R079 Chest pain, unspecified: Secondary | ICD-10-CM | POA: Diagnosis present

## 2014-06-30 DIAGNOSIS — I517 Cardiomegaly: Secondary | ICD-10-CM

## 2014-06-30 DIAGNOSIS — I10 Essential (primary) hypertension: Secondary | ICD-10-CM

## 2014-06-30 DIAGNOSIS — R0789 Other chest pain: Secondary | ICD-10-CM

## 2014-06-30 LAB — RENAL FUNCTION PANEL
Albumin: 3 g/dL — ABNORMAL LOW (ref 3.5–5.2)
Anion gap: 18 — ABNORMAL HIGH (ref 5–15)
BUN: 59 mg/dL — AB (ref 6–23)
CALCIUM: 7.7 mg/dL — AB (ref 8.4–10.5)
CO2: 22 meq/L (ref 19–32)
CREATININE: 11.26 mg/dL — AB (ref 0.50–1.10)
Chloride: 93 mEq/L — ABNORMAL LOW (ref 96–112)
GFR calc Af Amer: 5 mL/min — ABNORMAL LOW (ref >90)
GFR calc non Af Amer: 4 mL/min — ABNORMAL LOW (ref >90)
GLUCOSE: 257 mg/dL — AB (ref 70–99)
PHOSPHORUS: 5.3 mg/dL — AB (ref 2.3–4.6)
Potassium: 4.7 mEq/L (ref 3.7–5.3)
Sodium: 133 mEq/L — ABNORMAL LOW (ref 137–147)

## 2014-06-30 LAB — GLUCOSE, CAPILLARY
GLUCOSE-CAPILLARY: 346 mg/dL — AB (ref 70–99)
GLUCOSE-CAPILLARY: 404 mg/dL — AB (ref 70–99)
GLUCOSE-CAPILLARY: 441 mg/dL — AB (ref 70–99)
Glucose-Capillary: 212 mg/dL — ABNORMAL HIGH (ref 70–99)
Glucose-Capillary: 266 mg/dL — ABNORMAL HIGH (ref 70–99)
Glucose-Capillary: 282 mg/dL — ABNORMAL HIGH (ref 70–99)
Glucose-Capillary: 387 mg/dL — ABNORMAL HIGH (ref 70–99)
Glucose-Capillary: 66 mg/dL — ABNORMAL LOW (ref 70–99)

## 2014-06-30 LAB — CBC
HCT: 28.4 % — ABNORMAL LOW (ref 36.0–46.0)
HEMOGLOBIN: 10 g/dL — AB (ref 12.0–15.0)
MCH: 30.8 pg (ref 26.0–34.0)
MCHC: 35.2 g/dL (ref 30.0–36.0)
MCV: 87.4 fL (ref 78.0–100.0)
Platelets: 152 10*3/uL (ref 150–400)
RBC: 3.25 MIL/uL — AB (ref 3.87–5.11)
RDW: 13.3 % (ref 11.5–15.5)
WBC: 4 10*3/uL (ref 4.0–10.5)

## 2014-06-30 LAB — BASIC METABOLIC PANEL
ANION GAP: 19 — AB (ref 5–15)
BUN: 58 mg/dL — ABNORMAL HIGH (ref 6–23)
CO2: 23 mEq/L (ref 19–32)
Calcium: 7.6 mg/dL — ABNORMAL LOW (ref 8.4–10.5)
Chloride: 91 mEq/L — ABNORMAL LOW (ref 96–112)
Creatinine, Ser: 11.12 mg/dL — ABNORMAL HIGH (ref 0.50–1.10)
GFR calc Af Amer: 5 mL/min — ABNORMAL LOW (ref >90)
GFR, EST NON AFRICAN AMERICAN: 4 mL/min — AB (ref >90)
GLUCOSE: 256 mg/dL — AB (ref 70–99)
Potassium: 4.5 mEq/L (ref 3.7–5.3)
SODIUM: 133 meq/L — AB (ref 137–147)

## 2014-06-30 LAB — HEPATITIS B SURFACE ANTIGEN: Hepatitis B Surface Ag: NEGATIVE

## 2014-06-30 MED ORDER — CARVEDILOL 25 MG PO TABS: 25.0000 mg | ORAL_TABLET | Freq: Two times a day (BID) | ORAL | Status: DC

## 2014-06-30 MED ORDER — CARVEDILOL 25 MG PO TABS
25.0000 mg | ORAL_TABLET | Freq: Two times a day (BID) | ORAL | Status: DC
  Administered 2014-06-30 – 2014-07-01 (×2): 25 mg via ORAL
  Filled 2014-06-30 (×4): qty 1

## 2014-06-30 MED ORDER — REGADENOSON 0.4 MG/5ML IV SOLN
INTRAVENOUS | Status: AC
Start: 2014-06-30 — End: 2014-06-30
  Administered 2014-06-30: 0.4 mg via INTRAVENOUS
  Filled 2014-06-30: qty 5

## 2014-06-30 MED ORDER — ASPIRIN 81 MG PO CHEW: 81.0000 mg | CHEWABLE_TABLET | Freq: Every day | ORAL | Status: AC

## 2014-06-30 MED ORDER — TECHNETIUM TC 99M SESTAMIBI GENERIC - CARDIOLITE
30.0000 | Freq: Once | INTRAVENOUS | Status: AC | PRN
  Administered 2014-06-30: 30 via INTRAVENOUS

## 2014-06-30 MED ORDER — TECHNETIUM TC 99M SESTAMIBI GENERIC - CARDIOLITE: 10.0000 | Freq: Once | INTRAVENOUS | Status: AC | PRN

## 2014-06-30 MED ORDER — REGADENOSON 0.4 MG/5ML IV SOLN
0.4000 mg | Freq: Once | INTRAVENOUS | Status: AC
  Administered 2014-06-30: 0.4 mg via INTRAVENOUS
  Filled 2014-06-30: qty 5

## 2014-06-30 MED ORDER — PANTOPRAZOLE SODIUM 40 MG PO TBEC: 40.0000 mg | DELAYED_RELEASE_TABLET | Freq: Every day | ORAL | Status: AC

## 2014-06-30 MED ORDER — TECHNETIUM TC 99M SESTAMIBI GENERIC - CARDIOLITE
10.0000 | Freq: Once | INTRAVENOUS | Status: AC | PRN
  Administered 2014-06-30: 10 via INTRAVENOUS

## 2014-06-30 MED ORDER — INSULIN GLARGINE 100 UNIT/ML ~~LOC~~ SOLN
16.0000 [IU] | Freq: Every day | SUBCUTANEOUS | Status: DC
  Administered 2014-06-30: 8 [IU] via SUBCUTANEOUS
  Filled 2014-06-30 (×2): qty 0.16

## 2014-06-30 MED ORDER — INSULIN ASPART 100 UNIT/ML ~~LOC~~ SOLN
SUBCUTANEOUS | Status: AC
  Filled 2014-06-30: qty 1

## 2014-06-30 NOTE — Progress Notes (Signed)
FMTS Attending Note Patient's care discussed with resident team, I agree with Dr Richarda Overlie note for today. Paula Compton, MD

## 2014-06-30 NOTE — Procedures (Signed)
I was present at this dialysis session, have reviewed the session itself and made  appropriate changes  Rob Matayah Reyburn MD (pgr) [phone]    (c) [phone] 06/30/2014, 2:40 PM   

## 2014-06-30 NOTE — Discharge Instructions (Addendum)

## 2014-06-30 NOTE — Progress Notes (Signed)
Lexiscan CL performed 

## 2014-06-30 NOTE — Progress Notes (Signed)
[facility] Progress Note   Subjective: another episode of BP down in nuc med, better now, lasted 20 min. Also reports late in HD problems with HTN, BP's over 200, going on for a good while  Filed Vitals:   06/29/14 1412 06/29/14 2007 06/30/14 0353 06/30/14 0957  BP: 156/80 167/82 149/82 168/87  Pulse: 82 71 71 74  Temp: 98.2 F (36.8 C) 99 F (37.2 C) 98.9 F (37.2 C)   TempSrc: Oral Oral Oral   Resp: Height:      Weight:   59.058 kg (130 lb 3.2 oz)   SpO2: 100% 99% 100%    Exam: General: alert thin bf, nad, OX3,  Neck: no jvd  Heart: RRR, 1/6 SEM at apex  Lungs: CTA bilat  Abdomen: BS pos. ,soft, nontender, nondistended  Extremities: no pedal edema  Neuro: moves all extrm. No focal acute changes  Access: R fem avgg pos. Bruit   Dialysis: [facility] on T[facility] .  EDW 59.5 HD Bath 2.o k. 2.5 ca Time 4hr Heparin 3000. Access R fem avgg  0 hectorol mcg IV/HD / Aranesp q thurs hd Venofer  q weekly hd   Assessment/Plan  1. Chest Pain- ruled out, for stress test today 2. ESRD - TTS HD 3. HTN/volume - BP up, she is a low wt gainer but has been having significant problems with late-in-HD hypertension. Coreg has been reported to help with this due to it's endothelin-1 blocking activity.  Will change BB to coreg. Have stopped hydralazine as she was not taking this at home (had been prescribed but hadn't started yet).  4. Anemia - hgb 11.2 hold esa / continue weekly fe on hd 5. Metabolic bone disease - Not on outpt binders with last phos 3.6/ ipth 502 (no vit d) start low dose vit d on hd 6. IDDM- per admit team hgb a1c 7.3 7. Ho Graves disease  Plan- HD tomorrow, first shift, f/u stress test results, start Coreg, don't hold before HD    Susan Moselle MD  pager [phone]    cell [phone]  06/30/2014, 10:43 AM     Recent Labs Lab 06/26/14 1827 06/28/14 1413 06/29/14 0545  NA 138 137 134*  K 5.0 4.6 4.7  CL 99 96 93*  CO2 GLUCOSE 33*  125* 191*  BUN 64* 29* 41*  CREATININE 9.26* 6.89* 8.36*  CALCIUM 8.6 8.6 8.1*  PHOS  --   --  6.5*    Recent Labs Lab 06/29/14 0545  ALBUMIN 3.3*    Recent Labs Lab 06/26/14 1521 06/28/14 1413  WBC 7.0 4.1  HGB 10.5* 11.2*  HCT 30.4* 32.2*  MCV 89.7 89.9  PLT 149* 188   . amLODipine  10 mg Oral QHS  . aspirin  81 mg Oral Daily  . atorvastatin  40 mg Oral q1800  . [START ON 07/01/2014] doxercalciferol  2 mcg Intravenous Q T,Th,Sa-HD  . enoxaparin (LOVENOX) injection  30 mg Subcutaneous Q24H  . [START ON 07/06/2014] ferric gluconate (FERRLECIT/NULECIT) IV  62.5 mg Intravenous Q Thu-HD  . hydrALAZINE  10 mg Oral TID  . insulin aspart  0-9 Units Subcutaneous TID WC  . insulin glargine  16 Units Subcutaneous QHS  . isosorbide mononitrate  30 mg Oral Daily  . levothyroxine  100 mcg Oral QAC breakfast  . lisinopril  40 mg Oral Daily  . metoprolol tartrate  12.5 mg Oral BID  . multivitamin  1 tablet  Oral QHS  . pantoprazole  40 mg Oral Q0600     acetaminophen, dextrose, gi cocktail, ondansetron (ZOFRAN) IV

## 2014-06-30 NOTE — Progress Notes (Signed)
Family Medicine Teaching Service Daily Progress Note Intern Pager: [phone]  Patient name: [name] Medical record number: [id number] Date of birth: 01-14-1981 Age: 33 y.o. Gender: female  Primary Care Provider: Hazeline Junker, MD Consultants: None Code Status: Full  Assessment and Plan: 33 y.o. female presenting with chest pain with new T wave inversions on V2 in clinic. PMH is significant for T1DM with retinopathy causing blindness, nephropathy causing ESRD, on HD since July 2014, UE DVT (no longer on anticoagulation) and hypothyroidism.   #Atypical chest pain: Most likely related to reflux or MSK in origin.  - Troponins <0.3 x3 - TSH- 3.49 - GI cocktail pRN - Cardiology Consulted- Nuclear Perfusion Study and if abnormal proceed with catheterization next week  -ASA , Protonix, and Metoprolol tartrate    #Type 1 Dm: reports having episodes of hypoglycemia earlier this week. Her blood sugars have been widely variable.  - A1C- 7.3 - CBG x24hr: 183, 277, 260, 206, 212, 282 - Senistive SSI  - Lantus 7U BID transitioned to 16U qhs today  #ESRD: dialysis on Tuesday, Thursday, and Saturday.  - Nephrology Consulted- HD today  #HTN: 149-167 / 80-82 - Last measured: 149/82  - Amlodipine, Hydralazine, Imdur, Lisinopril   FEN/GI: Renal Carb Modified; Saline lock  Prophylaxis: enoxaparin CrCl <30  Disposition: Admitted to [facility] Medicine Teaching Service.  Disposition pending Myoview results.  Subjective:  No acute complaints overnight.  States she has not experienced any chest pain today.  Denies any other complaints.  Ready to go home, but agrees to wait for Myoview results before determining disposition.  Going for dialysis later today.  Objective: Temp:  [98.2 F (36.8 C)-99 F (37.2 C)] 98.9 F (37.2 C) (09/25 0353) Pulse Rate:  [71-82] 71 (09/25 0353) Resp:  [16-18] 18 (09/25 0353) BP: (149-167)/(80-82) 149/82 mmHg (09/25 0353) SpO2:  [99 %-100 %] 100 % (09/25  0353) Weight:  [130 lb 3.2 oz (59.058 kg)] 130 lb 3.2 oz (59.058 kg) (09/25 0353) Physical Exam: General: 32yo female resting comfortably in no apparent distress Cardiovascular: S1 and S2 noted. Systolic murmur noted at left sternal border. Regular rate and rhythm Respiratory: Clear to auscultation bilaterally.  No wheezing noted.  No increased work of breathing. Abdomen: Bowel sounds noted. Soft and nondistended. No tenderness or masses to palpation. Extremities: No edema noted.  Laboratory:  Recent Labs Lab 06/26/14 1521 06/28/14 1413  WBC 7.0 4.1  HGB 10.5* 11.2*  HCT 30.4* 32.2*  PLT 149* 188    Recent Labs Lab 06/26/14 1827 06/28/14 1413 06/29/14 0545  NA 138 137 134*  K 5.0 4.6 4.7  CL 99 96 93*  CO2 BUN 64* 29* 41*  CREATININE 9.26* 6.89* 8.36*  CALCIUM 8.6 8.6 8.1*  GLUCOSE 33* 125* 191*   Troponin <0.3 x3 TSH 3.49  Graylon Gunning, DO 06/30/2014, 8:06 AM PGY-1, [facility] Family Medicine FPTS Intern pager: [phone], text pages welcome

## 2014-06-30 NOTE — Progress Notes (Signed)
Patient Name: [name] Date of Encounter: 06/30/2014  Principal Problem:   Chest pain with moderate risk of acute coronary syndrome Active Problems:   DM (diabetes mellitus), type 1   Diabetic retinopathy-Blind   ESRD (end stage renal disease) on dialysis   HTN (hypertension), malignant   Abnormal EKG   Patient Profile: 33 yo female w/ hx Type 1-IDDM, labile HTN, ESRD on HD, blindness, was admitted 09/24 with chest pain  SUBJECTIVE: No chest pain, had another episode of flushing, diaphoresis this am, SBP was elevated to 170s during this, HR 70s. Temp normal, CBG > 300 this am, recheck pending.  OBJECTIVE Filed Vitals:   06/29/14 0549 06/29/14 1412 06/29/14 2007 06/30/14 0353  BP: 140/78 156/80 167/82 149/82  Pulse: 74 82 71 71  Temp: 98.1 F (36.7 C) 98.2 F (36.8 C) 99 F (37.2 C) 98.9 F (37.2 C)  TempSrc: Oral Oral Oral Oral  Resp:  Height:      Weight:    130 lb 3.2 oz (59.058 kg)  SpO2: 100% 100% 99% 100%   No intake or output data in the 24 hours ending 06/30/14 0917 Filed Weights   06/28/14 1404 06/28/14 1729 06/30/14 0353  Weight: 128 lb (58.06 kg) 127 lb 4.8 oz (57.743 kg) 130 lb 3.2 oz (59.058 kg)  PHYSICAL EXAM General: Well developed, well nourished, female in no acute distress. Head: Normocephalic, atraumatic.  Neck: Supple without bruits, JVD not elevated. Lungs:  Resp regular and unlabored, few rales, good air echange. Heart: RRR, S1, S2, no S3, S4, 2/6 murmur; no rub. Abdomen: Soft, non-tender, non-distended, BS + x 4.  Extremities: No clubbing, cyanosis, no edema.  Neuro: Alert and oriented X 3. Moves all extremities spontaneously. Skin: diaphoretic Psych: Normal affect.  LABS: CBC: Recent Labs  06/28/14 1413  WBC 4.1  HGB 11.2*  HCT 32.2*  MCV 89.9  PLT 188   Basic Metabolic Panel: Recent Labs  06/28/14 1413 06/29/14 0545  NA 137 134*  K 4.6 4.7  CL 96 93*  CO2 27 23  GLUCOSE 125* 191*  BUN 29* 41*    CREATININE 6.89* 8.36*  CALCIUM 8.6 8.1*  PHOS  --  6.5*   Liver Function Tests: Recent Labs  06/29/14 0545  ALBUMIN 3.3*   Cardiac Enzymes: Recent Labs  06/28/14 1848 06/28/14 2309 06/29/14 0545  TROPONINI <0.30 <0.30 <0.30   Hemoglobin A1C: Recent Labs  06/29/14 0545  HGBA1C 7.3*   Fasting Lipid Panel: pending for am  Thyroid Function Tests: Recent Labs  06/29/14 0545  TSH 3.490    TELE:  SR, seen in nuc med  Radiology/Studies: Dg Chest 2 View 06/28/2014   CLINICAL DATA:  Chest pain and pressure today, shortness of breath with chest pressure, abnormal EKG, type I diabetes, end-stage renal disease on dialysis  EXAM: CHEST  2 VIEW  COMPARISON:  06/26/2014  FINDINGS: Normal heart size, mediastinal contours, and pulmonary vascularity.  Lungs clear.  No pleural effusion or pneumothorax.  Bones unremarkable.  IMPRESSION: Normal exam.   Electronically Signed   By: Ulyses Southward M.D.   On: 06/28/2014 14:57   Current Medications:  . amLODipine  10 mg Oral QHS  . aspirin  81 mg Oral Daily  . atorvastatin  40 mg Oral q1800  . [START ON 07/01/2014] doxercalciferol  2 mcg Intravenous Q T,Th,Sa-HD  . enoxaparin (LOVENOX) injection  30 mg Subcutaneous Q24H  . [START ON 07/06/2014] ferric gluconate (FERRLECIT/NULECIT)  IV  62.5 mg Intravenous Q Thu-HD  . hydrALAZINE  10 mg Oral TID  . insulin aspart  0-9 Units Subcutaneous TID WC  . insulin glargine  16 Units Subcutaneous QHS  . isosorbide mononitrate  30 mg Oral Daily  . levothyroxine  100 mcg Oral QAC breakfast  . lisinopril  40 mg Oral Daily  . metoprolol tartrate  12.5 mg Oral BID  . multivitamin  1 tablet Oral QHS  . pantoprazole  40 mg Oral Q0600      ASSESSMENT AND PLAN: Principal Problem:   Chest pain with moderate risk of acute coronary syndrome - ez neg MI, for MV today, cath next week if + ischemia or EF is decreased. Echo ordered, pending  Active Problems:   DM (diabetes mellitus), type 1 - per IM, A1c 7.3     Diabetic retinopathy-Blind - per IM    ESRD (end stage renal disease) on dialysis - per IM/Renal teams, Tu/Th/Sat normally    HTN (hypertension), malignant - SBP 140-183 in last 24 hours    Abnormal EKG - SR, ?early repol  Signed, Theodore Demark , PA-C 9:17 AM 06/30/2014   I have seen and evaluated the patient this PM along with Theodore Demark , PA-C. I agree with her findings, examination as well as impression recommendations.  Still having intermediate episodes of discomfort, but not as bad as pre-admission.  Stress test results were not available when I saw her.  -- now reported as a normal study.  Low Risk.  Would be OK for d/c from a cardiac perspective.  Most likely non-anginal chest pain.   Marykay Lex, M.D., M.S. Interventional Cardiologist   Pager # [phone]

## 2014-06-30 NOTE — Progress Notes (Signed)
Hemodialysis- Pt under edw (weight 50kg standing, edw=59.5kg). Difficulty pulling fluid even though orders for 1L. Complaints of nausea when in UF. Continue to monitor pt.

## 2014-06-30 NOTE — Progress Notes (Signed)
Echocardiogram 2D Echocardiogram has been performed.  [name] 06/30/2014, 12:06 PM

## 2014-06-30 NOTE — Procedures (Deleted)
I was present at this dialysis session, have reviewed the session itself and made  appropriate changes  Vinson Moselle MD (pgr) [phone]    ([phone] 06/30/2014, 10:43 AM

## 2014-07-01 DIAGNOSIS — E108 Type 1 diabetes mellitus with unspecified complications: Secondary | ICD-10-CM

## 2014-07-01 LAB — CBC
HCT: 27.6 % — ABNORMAL LOW (ref 36.0–46.0)
Hemoglobin: 9.6 g/dL — ABNORMAL LOW (ref 12.0–15.0)
MCH: 30.7 pg (ref 26.0–34.0)
MCHC: 34.8 g/dL (ref 30.0–36.0)
MCV: 88.2 fL (ref 78.0–100.0)
PLATELETS: 155 10*3/uL (ref 150–400)
RBC: 3.13 MIL/uL — ABNORMAL LOW (ref 3.87–5.11)
RDW: 13.3 % (ref 11.5–15.5)
WBC: 3.4 10*3/uL — ABNORMAL LOW (ref 4.0–10.5)

## 2014-07-01 LAB — GLUCOSE, CAPILLARY
Glucose-Capillary: 115 mg/dL — ABNORMAL HIGH (ref 70–99)
Glucose-Capillary: 116 mg/dL — ABNORMAL HIGH (ref 70–99)
Glucose-Capillary: 225 mg/dL — ABNORMAL HIGH (ref 70–99)

## 2014-07-01 LAB — LIPID PANEL
CHOL/HDL RATIO: 3.2 ratio
CHOLESTEROL: 188 mg/dL (ref 0–200)
HDL: 59 mg/dL (ref >39)
LDL Cholesterol: 108 mg/dL — ABNORMAL HIGH (ref 0–99)
Triglycerides: 104 mg/dL (ref <150)
VLDL: 21 mg/dL (ref 0–40)

## 2014-07-01 LAB — RENAL FUNCTION PANEL
ANION GAP: 16 — AB (ref 5–15)
Albumin: 3 g/dL — ABNORMAL LOW (ref 3.5–5.2)
BUN: 24 mg/dL — ABNORMAL HIGH (ref 6–23)
CO2: 26 mEq/L (ref 19–32)
Calcium: 7.9 mg/dL — ABNORMAL LOW (ref 8.4–10.5)
Chloride: 96 mEq/L (ref 96–112)
Creatinine, Ser: 5.99 mg/dL — ABNORMAL HIGH (ref 0.50–1.10)
GFR, EST AFRICAN AMERICAN: 10 mL/min — AB (ref >90)
GFR, EST NON AFRICAN AMERICAN: 9 mL/min — AB (ref >90)
Glucose, Bld: 124 mg/dL — ABNORMAL HIGH (ref 70–99)
PHOSPHORUS: 6 mg/dL — AB (ref 2.3–4.6)
Potassium: 4.1 mEq/L (ref 3.7–5.3)
SODIUM: 138 meq/L (ref 137–147)

## 2014-07-01 MED ORDER — HEPARIN SODIUM (PORCINE) 1000 UNIT/ML DIALYSIS: 1000.0000 [IU] | INTRAMUSCULAR | Status: DC | PRN

## 2014-07-01 MED ORDER — LIDOCAINE HCL (PF) 1 % IJ SOLN: 5.0000 mL | INTRAMUSCULAR | Status: DC | PRN

## 2014-07-01 MED ORDER — PENTAFLUOROPROP-TETRAFLUOROETH EX AERO: 1.0000 "application " | INHALATION_SPRAY | CUTANEOUS | Status: DC | PRN

## 2014-07-01 MED ORDER — ALTEPLASE 2 MG IJ SOLR
2.0000 mg | Freq: Once | INTRAMUSCULAR | Status: DC | PRN
  Filled 2014-07-01: qty 2

## 2014-07-01 MED ORDER — SODIUM CHLORIDE 0.9 % IV SOLN: 100.0000 mL | INTRAVENOUS | Status: DC | PRN

## 2014-07-01 MED ORDER — HEPARIN SODIUM (PORCINE) 1000 UNIT/ML DIALYSIS: 3100.0000 [IU] | Freq: Once | INTRAMUSCULAR | Status: DC

## 2014-07-01 MED ORDER — DOXERCALCIFEROL 4 MCG/2ML IV SOLN
INTRAVENOUS | Status: AC
  Administered 2014-07-01: 2 ug via INTRAVENOUS
  Filled 2014-07-01: qty 2

## 2014-07-01 MED ORDER — LIDOCAINE-PRILOCAINE 2.5-2.5 % EX CREA: 1.0000 "application " | TOPICAL_CREAM | CUTANEOUS | Status: DC | PRN

## 2014-07-01 MED ORDER — NEPRO/CARBSTEADY PO LIQD
237.0000 mL | ORAL | Status: DC | PRN
  Filled 2014-07-01: qty 237

## 2014-07-01 NOTE — Progress Notes (Signed)
[facility] Progress Note  Assessment/Plan: 1. Chest pain - neg lexiscan 2. ESRD - TTS - HD back on schedule today 3. Anemia - Hgb 9.6 - varies - continue Aranesp at d/c - dosed earlier this week at dialysis 4. Secondary hyperparathyroidism - hectorol started should help low Ca - has only taken prn binders as outpt - P usually ok 5. HTN/volume - net UF only 500 yesterday; started on coreg bid yesterday - but only got one dose at 1351 and that was BEFORE her dialysis treatment.  Keeping even today- need post HD stand up weight-  6. Nutrition - continue renal diet 7. Type 1 DM - per primary 8. Disp - plan d/c today  Sheffield Slider, PA-C [facility] Beeper [phone] 07/01/2014,8:41 AM  LOS: 3 days   Pt seen, examined and agree w A/P as above.  Vinson Moselle MD pager [phone]    cell [phone] 07/01/2014, 9:57 AM    Subjective:   Eating snack on HD  Objective Filed Vitals:   06/30/14 2000 07/01/14 0400 07/01/14 0803 07/01/14 0808  BP: 164/80 129/70 124/76 130/81  Pulse: 77 72 71 72  Temp: 99.5 F (37.5 C) 98.2 F (36.8 C) 98.3 F (36.8 C)   TempSrc: Oral Oral Oral   Resp: Height:      Weight:  59.013 kg (130 lb 1.6 oz) 57.1 kg (125 lb 14.1 oz)   SpO2: 100% 100% 98%    Physical Exam General: NAD Heart: RRR Lungs:no rales Abdomen: soft NT Extremities: no edema Dialysis Access: right fem cath  Dialysis Orders: NW on TTS .  EDW 59.5 HD Bath 2.o k. 2.5 ca Time 4hr Heparin 3000. Access R fem avgg  0 hectorol mcg IV/HD / Aranesp q thurs hd Venofer  q weekly hd   Additional Objective Labs: Basic Metabolic Panel:  Recent Labs Lab 06/29/14 0545 06/30/14 1300 06/30/14 1419  NA 134* 133* 133*  K 4.7 4.5 4.7  CL 93* 91* 93*  CO2 GLUCOSE 191* 256* 257*  BUN 41* 58* 59*  CREATININE 8.36* 11.12* 11.26*  CALCIUM 8.1* 7.6* 7.7*  PHOS 6.5*  --  5.3*   Liver Function Tests:  Recent Labs Lab  06/29/14 0545 06/30/14 1419  ALBUMIN 3.3* 3.0*   CBC:  Recent Labs Lab 06/26/14 1521 06/28/14 1413 06/30/14 1419 07/01/14 0808  WBC 7.0 4.1 4.0 3.4*  HGB 10.5* 11.2* 10.0* 9.6*  HCT 30.4* 32.2* 28.4* 27.6*  MCV 89.7 89.9 87.4 88.2  PLT 149* 188 152 155  Cardiac Enzymes:  Recent Labs Lab 06/26/14 1531 06/28/14 1509 06/28/14 1848 06/28/14 2309 06/29/14 0545  TROPONINI <0.30 <0.30 <0.30 <0.30 <0.30   CBG:  Recent Labs Lab 06/30/14 1906 06/30/14 2023 06/30/14 2356 07/01/14 0408 07/01/14 0731  GLUCAP 66* 266* 404* 115* 116*  Studies/Results: Nm Myocar Multi W/spect W/wall Motion / Ef  06/30/2014   ADDENDUM REPORT: 06/30/2014 15:22   Electronically Signed   By: Tobias Alexander   On: 06/30/2014 15:22   06/30/2014   CLINICAL DATA:  Chest pain  EXAM: Lexiscan Myovue  TECHNIQUE: The patient received IV Lexiscan .  over 15 seconds. 33.0 mCi of Technetium 56m Sestamibi injected at 30 seconds. Quantitative SPECT images were obtained in the vertical, horizontal and short axis planes after a 45 minute delay. Rest images were obtained with similar planes and delay using 10.2 mCi of Technetium 53m Sestamibi.  FINDINGS: ECG:  SR, LVH, negative  T waves in aVL, At stress:  No changes  Symptoms: None  RAW Data:  Extracardiac activity not affecting ability to read.  QPS:  SDS: 0  Quantitiative Gated SPECT EF: LVEF >70%, no wall motion abnormalities.  Perfusion Images: Breast attenuation in the basal and mid anteroseptal segments at stress and rest. No ischemia.  IMPRESSION: 1. Low risk scan with no evidence of prior myocardial infarction and no ischemia. No ischemic changes on ECG. No symptoms during Lexiscan infusion.  2.  Hyperdynamic LVEF > 70%, no regional wall motion abnormalities.  Tobias Alexander  Electronically Signed: By: Tobias Alexander On: 06/30/2014 15:12   Medications:   . amLODipine  10 mg Oral QHS  . aspirin  81 mg Oral Daily  . atorvastatin  40 mg Oral q1800  .  carvedilol  25 mg Oral BID WC  . doxercalciferol  2 mcg Intravenous Q T,Th,Sa-HD  . enoxaparin (LOVENOX) injection  30 mg Subcutaneous Q24H  . [START ON 07/06/2014] ferric gluconate (FERRLECIT/NULECIT) IV  62.5 mg Intravenous Q Thu-HD  . heparin  3,100 Units Dialysis Once in dialysis  . insulin aspart  0-9 Units Subcutaneous TID WC  . insulin glargine  16 Units Subcutaneous QHS  . isosorbide mononitrate  30 mg Oral Daily  . levothyroxine  100 mcg Oral QAC breakfast  . lisinopril  40 mg Oral Daily  . multivitamin  1 tablet Oral QHS  . pantoprazole  40 mg Oral Q0600

## 2014-07-01 NOTE — Progress Notes (Signed)
Family Medicine Teaching Service Daily Progress Note Intern Pager: [phone]  Patient name: [name] Medical record number: [id number] Date of birth: [birth year] Age: 33 y.o. Gender: female  Primary Care Provider: Hazeline Junker, MD Consultants: None Code Status: Full  Assessment and Plan: 33 y.o. female presenting with chest pain with new T wave inversions on V2 in clinic. PMH is significant for T1DM with retinopathy causing blindness, nephropathy causing ESRD, on HD since July 2014, UE DVT (no longer on anticoagulation) and hypothyroidism.   #Atypical chest pain: Most likely related to reflux or MSK in origin.  - Troponins <0.3 x3 - TSH- 3.49 - GI cocktail pRN - Cardiology Consulted- Nuclear Perfusion Study and if abnormal proceed with catheterization next week  -ASA , Protonix, and Metoprolol tartrate    #Type 1 Dm: reports having episodes of hypoglycemia earlier this week. Her blood sugars have been widely variable.  - A1C- 7.3 - CBG x24hr: 183, 277, 260, 206, 212, 282 - Senistive SSI  - Lantus 7U BID transitioned to 16U qhs today  #ESRD: dialysis on Tuesday, Thursday, and Saturday.  - Creatinine 5.99 today down from 11.26 yesterday - Nephrology Consulted- HD today  #HTN: 149-167 / 80-82 - Last measured: 149/82  - Amlodipine, Hydralazine, Imdur, Lisinopril   FEN/GI: Renal Carb Modified; Saline lock  Prophylaxis: enoxaparin CrCl <30  Disposition: Admitted to [facility] Medicine Teaching Service.  Anticipated discharge today.  Subjective:  No acute complaints overnight.  Noted nausea during dialysis yesterday and was kept overnight for second attempt at dialysis today. Continues to deny chest pain today.  States she is feeling well and feels ready for discharge.  Objective: Temp:  [98.2 F (36.8 C)-99.5 F (37.5 C)] 98.3 F (36.8 C) (09/26 0803) Pulse Rate:  [70-87] 79 (09/26 1000) Resp:  [12-18] 12 (09/26 1000) BP: (124-178)/(70-99) 144/82 mmHg (09/26 1000) SpO2:   [98 %-100 %] 98 % (09/26 0803) Weight:  [125 lb 10.6 oz (57 kg)-130 lb 1.6 oz (59.013 kg)] 125 lb 14.1 oz (57.1 kg) (09/26 0803) Physical Exam: General: 32yo female resting comfortably in no apparent distress Cardiovascular: S1 and S2 noted. Systolic murmur noted at left sternal border. Regular rate and rhythm Respiratory: Clear to auscultation bilaterally.  No wheezing noted.  No increased work of breathing. Abdomen: Bowel sounds noted. Soft and nondistended. No tenderness or masses to palpation. Extremities: No edema noted.  Laboratory:  Recent Labs Lab 06/28/14 1413 06/30/14 1419 07/01/14 0808  WBC 4.1 4.0 3.4*  HGB 11.2* 10.0* 9.6*  HCT 32.2* 28.4* 27.6*  PLT 188 152 155    Recent Labs Lab 06/30/14 1300 06/30/14 1419 07/01/14 0808  NA 133* 133* 138  K 4.5 4.7 4.1  CL 91* 93* 96  CO2 BUN 58* 59* 24*  CREATININE 11.12* 11.26* 5.99*  CALCIUM 7.6* 7.7* 7.9*  GLUCOSE 256* 257* 124*   Troponin <0.3 x3 TSH 3.49  Graylon Gunning, DO 07/01/2014, 10:26 AM PGY-1, [facility] Family Medicine FPTS Intern pager: [phone], text pages welcome

## 2014-07-01 NOTE — Procedures (Signed)
I was present at this dialysis session, have reviewed the session itself and made  appropriate changes  Vinson Moselle MD (pgr) [phone]    ([phone] 07/01/2014, 9:58 AM

## 2014-07-01 NOTE — Discharge Summary (Signed)
Family Medicine Teaching [facility] Discharge Summary  Patient name: [name] Medical record number: [id number] Date of birth: Jun 03, 1981 Age: 33 y.o. Gender: female Date of Admission: 06/28/2014  Date of Discharge: 07/01/14 Admitting Physician: Barbaraann Barthel, MD  Primary Care Provider: Hazeline Junker, MD Consultants: Cardiology, Renal  Indication for Hospitalization: Chest Pain  Discharge Diagnoses/Problem List:  Chest Pain Type I Diabetes Blindness Secondary to Retinopathy ESRD secondary to Nephropathy HTN  Disposition: Discharge Home  Discharge Condition: Stable  Brief Hospital Course:  [name] is a 33yo female with history of type I diabetes with blindness secondary to retinopathy and ESRD secondary to nephropathy (HD since July 2014), upper extremity DVT, and hypothyroidism, who presented to her family medicine physician's office with chest pain. EKG in the office showed T wave inversions in V1 and V2 and [name] was then sent to the ED for further evaluation. Troponins were negative x3. TSH was 3.49.   Blood glucose was very labile throughout hospitalization, ranging from 39-282; she was treated with sensitive SSI and Lantus that was titrated up to 16Units qhs. A1C of 7.3. Lipid panel showed cholesterol 188, triglycerides of 104, HDL of 59, and LDL of 108.  Her blood pressure was elevated, so she was initiated on the new blood pressure regimen established at her family physician's office which included Imdur, Hydralazine, Lisinopril, and Amlodipine.  Cardiology was consulted and stated that the EKG abnormalities were most likely secondary to lead placement. They recommended adding Metoprolol, Protonix, and Aspirin  to medication regimen. Myovue showed low risk scan with no evidence of prior myocardial infarction and no ischemia, indicating non-anginal chest pain as most likely etiology.  Creatinine of 6.89, trended up to 11.26 on 9/25. Renal was consulted and  [name] went to dialysis on 9/25 but became nauseous; she returned to dialysis on 9/26. Creatinine was 5.99 at discharge.  She was discharged on 9/26 following improvement in chest pain after cardiac etiology was ruled out.  Issues for Follow Up:  - Discharged on Amlodipine, Coreg, Imdur, and Lisinopril.  BP was 130/71 at discharge - Discharged on home dose of Lantus 14U BID and Novolog.  Consider transition to qhs dosing of Lantus. - Discharged on Protonix per Cardiology recommendation.  Significant Procedures: HD on 9/25 and 9/26  Significant Labs and Imaging:   Recent Labs Lab 06/28/14 1413 06/30/14 1419 07/01/14 0808  WBC 4.1 4.0 3.4*  HGB 11.2* 10.0* 9.6*  HCT 32.2* 28.4* 27.6*  PLT 188 152 155    Recent Labs Lab 06/28/14 1413 06/29/14 0545 06/30/14 1300 06/30/14 1419 07/01/14 0808  NA 137 134* 133* 133* 138  K 4.6 4.7 4.5 4.7 4.1  CL 96 93* 91* 93* 96  CO2 GLUCOSE 125* 191* 256* 257* 124*  BUN 29* 41* 58* 59* 24*  CREATININE 6.89* 8.36* 11.12* 11.26* 5.99*  CALCIUM 8.6 8.1* 7.6* 7.7* 7.9*  PHOS  --  6.5*  --  5.3* 6.0*  ALBUMIN  --  3.3*  --  3.0* 3.0*   Lipid Panel     Component Value Date/Time   CHOL 188 07/01/2014 0542   TRIG 104 07/01/2014 0542   HDL 59 07/01/2014 0542   CHOLHDL 3.2 07/01/2014 0542   VLDL 21 07/01/2014 0542   LDLCALC 108* 07/01/2014 0542   LDLDIRECT 85 06/10/2013 1522   Troponin <0.3 x3  TSH 3.49  Myovue:  Low risk scan with no evidence of prior myocardial infarction and no  ischemia. No ischemic changes on ECG. No symptoms during Lexiscan infusion. Hyperdynamic LVEF > 70%, no regional wall motion abnormalities  Results/Tests Pending at Time of Discharge: None  Discharge Medications:    Medication List         amLODipine 10 MG tablet  Commonly known as:  NORVASC  Take 10 mg by mouth at bedtime.     aspirin 81 MG chewable tablet  Chew 1 tablet (81 mg total) by mouth daily.     atorvastatin 40 MG tablet   Commonly known as:  LIPITOR  Take 40 mg by mouth daily.     carvedilol 25 MG tablet  Commonly known as:  COREG  Take 1 tablet (25 mg total) by mouth 2 (two) times daily with a meal.     insulin aspart 100 UNIT/ML injection  Commonly known as:  novoLOG  Inject 10-15 Units into the skin 3 (three) times daily before meals.     insulin glargine 100 UNIT/ML injection  Commonly known as:  LANTUS  Inject 14 Units into the skin 2 (two) times daily.     isosorbide mononitrate 30 MG 24 hr tablet  Commonly known as:  IMDUR  Take 1 tablet (30 mg total) by mouth daily.     levothyroxine 100 MCG tablet  Commonly known as:  SYNTHROID, LEVOTHROID  Take 100 mcg by mouth daily before breakfast.     lisinopril 40 MG tablet  Commonly known as:  PRINIVIL,ZESTRIL  Take 40 mg by mouth daily.     pantoprazole 40 MG tablet  Commonly known as:  PROTONIX  Take 1 tablet (40 mg total) by mouth daily.       Discharge Instructions: Please refer to Patient Instructions section of EMR for full details.  Patient was counseled important signs and symptoms that should prompt return to medical care, changes in medications, dietary instructions, activity restrictions, and follow up appointments.   Follow-Up Appointments:     Follow-up Information   Follow up with Beverely Low, MD. Schedule an appointment as soon as possible for a visit on 07/07/2014. ( :00pm for Hospital Follow Up)    Specialty:  Family Medicine   Contact information:   [address] [place] [address] [phone]      Araceli Bouche, DO 07/01/2014, 7:21 PM PGY-1, [facility] Family Medicine

## 2014-07-01 NOTE — Progress Notes (Signed)
FMTS Attending Note Patient seen and examined by me in HD session, discussed with resident team and I agree with Dr Richarda Overlie note as documented.  Patient denies chest pain or dyspnea.  Discussed results of Myoview, which is not consistent with ischemic cause of her chest pain.  She denies heartburn or dyspepsia.   Plan for discharge to home after HD today.  Outpatient PPI and close follow up (suspect GI cause for her presenting complaint).  Resume outpatient HD schedule.  Paula Compton, MD

## 2014-07-03 NOTE — Discharge Summary (Signed)
FMTS Attending Note Patient seen and examined by me on day of discharge, I agree with Dr Richarda Overlie note and plan for discharge.  Paula Compton, MD

## 2014-07-05 ENCOUNTER — Encounter: Payer: Self-pay | Admitting: Family Medicine

## 2014-07-05 ENCOUNTER — Ambulatory Visit (INDEPENDENT_AMBULATORY_CARE_PROVIDER_SITE_OTHER): Payer: Medicare Other | Admitting: Family Medicine

## 2014-07-05 VITALS — BP 144/79 | HR 92 | Temp 98.5°F | Ht 62.5 in | Wt 131.7 lb

## 2014-07-05 DIAGNOSIS — E1029 Type 1 diabetes mellitus with other diabetic kidney complication: Secondary | ICD-10-CM

## 2014-07-05 DIAGNOSIS — N058 Unspecified nephritic syndrome with other morphologic changes: Secondary | ICD-10-CM

## 2014-07-05 DIAGNOSIS — R21 Rash and other nonspecific skin eruption: Secondary | ICD-10-CM | POA: Insufficient documentation

## 2014-07-05 DIAGNOSIS — I10 Essential (primary) hypertension: Secondary | ICD-10-CM

## 2014-07-05 DIAGNOSIS — E1021 Type 1 diabetes mellitus with diabetic nephropathy: Secondary | ICD-10-CM

## 2014-07-05 DIAGNOSIS — R0789 Other chest pain: Secondary | ICD-10-CM

## 2014-07-05 NOTE — Patient Instructions (Signed)
-   Take novolog 5 units + 1 unit for every 50mg /dl over 150mg /dl blood sugar three times a day - Take lantus 10 units twice per day - Take norvasc, lisinopril AND coreg daily - I will call you with the results of your blood test today - PLEASE record your blood pressure and blood sugars until our follow up in 2 weeks.   It was great to see you again :) - Dr. Jarvis NewcomerGrunz

## 2014-07-05 NOTE — Assessment & Plan Note (Signed)
Above goal range today without taking coreg. Reviewed need for third agent and nephrology follow up.

## 2014-07-05 NOTE — Progress Notes (Signed)
Patient ID: [name], female   DOB: 02/21/1981, 33 y.o.   MRN: [id number]   Subjective:  [name] is a 33 y.o. female here for hospital follow up.   Diabetes mellitus:  Blood sugars at home: forgot to bring her records. Last night was 38, she took glucose tablets (4) and a piece of bread, 150s. She went to sleep and woke up to 56 after which she took 2 glucose tablets and a piece of bread and it jumped 198.   She is taking novolog 5 units base + 1u per 50mg /dl over 150mg /dl.  She is taking lantus 14u qAM and 14u qHS.   Blood pressure: Blood pressures at dialysis have been within the normal range when she has taken her medications though she has not been taking coreg due to fear of "crashing" by which she means she begins sweating, feeling feverish, cramping in her feet and hands, and some nausea with throbbing in the back of your head.   Rash:  She is concerned about a rash that comes and goes on her forearms. She only just noticed a cyst in her left arm that is somewhat painful. Her major concern today is having lupus.   Review of Systems:  Per HPI. All other systems reviewed and are negative. Objective:  BP 144/79  Pulse 92  Temp(Src) 98.5 F (36.9 C) (Oral)  Ht 5' 2.5" (1.588 m)  Wt 131 lb 11.2 oz (59.739 kg)  BMI 23.69 kg/m2  SpO2 100%  LMP 07/03/2014  Gen: Blind, interactive 33 y.o. female in NAD HEENT: MMM, L eye prosthesis Left arm: hyperpigmented discoid rash on forearm. Also with mobile cystic structure approximately 1.5cm x 2 cm on volar aspect of left forearm.  Right arm: scattered maculopapular rash on lower arm with excoriations.  Neuro: Alert and oriented, speech normal      Chemistry      Component Value Date/Time   NA 138 07/01/2014 0808   K 4.1 07/01/2014 0808   CL 96 07/01/2014 0808   CO2 26 07/01/2014 0808   BUN 24* 07/01/2014 0808   CREATININE 5.99* 07/01/2014 0808      Component Value Date/Time   CALCIUM 7.9* 07/01/2014 0808   CALCIUM 7.7*  04/04/2013 1745   ALKPHOS 100 11/11/2013 1400   AST 20 11/11/2013 1400   ALT 18 11/11/2013 1400   BILITOT 0.9 11/11/2013 1400     Lab Results  Component Value Date   HGBA1C 7.3* 06/29/2014   Assessment:     [name] is a 33 y.o. female here for hospital follow up.    Plan:   Problem List Items Addressed This Visit     Cardiovascular and Mediastinum   Essential hypertension, benign     Above goal range today without taking coreg. Reviewed need for third agent and nephrology follow up.        Endocrine   DM (diabetes mellitus), type 1 (Chronic)     Given recent low blood sugars will decrease insulin globally and increase slowly. DKA precautions reviewed.  - Novolog 5u + 1:50 > 150mg /dl TIDAC - Lantus 40J BID (duration of action shown to be less than 24 hours) - Will consider referral to endocrinology if poor control continues.  - Hb A1c today.      Musculoskeletal and Integument   Rash and nonspecific skin eruption - Primary   Relevant Orders      ANA     Other   Atypical chest pain  Relevant Orders      ANA

## 2014-07-05 NOTE — Assessment & Plan Note (Signed)
Given recent low blood sugars will decrease insulin globally and increase slowly. DKA precautions reviewed.  - Novolog 5u + 1:50 > 150mg /dl TIDAC - Lantus 29F10u BID (duration of action shown to be less than 24 hours) - Will consider referral to endocrinology if poor control continues.  - Hb A1c today.

## 2014-07-06 ENCOUNTER — Telehealth: Payer: Self-pay | Admitting: Family Medicine

## 2014-07-06 LAB — ANA: ANA: NEGATIVE

## 2014-07-06 NOTE — Telephone Encounter (Signed)
ANA screen for lupus is negative. Pt should be reassured. Will continue to monitor rash.

## 2014-07-06 NOTE — Telephone Encounter (Signed)
Spoke with patient and informed her of below 

## 2014-07-07 ENCOUNTER — Ambulatory Visit: Payer: 59 | Admitting: Family Medicine

## 2014-07-25 ENCOUNTER — Other Ambulatory Visit: Payer: Self-pay | Admitting: *Deleted

## 2014-07-25 MED ORDER — AMLODIPINE BESYLATE 10 MG PO TABS: 10.0000 mg | ORAL_TABLET | Freq: Every day | ORAL | Status: DC

## 2014-07-28 ENCOUNTER — Encounter: Payer: Self-pay | Admitting: Family Medicine

## 2014-07-28 ENCOUNTER — Ambulatory Visit (INDEPENDENT_AMBULATORY_CARE_PROVIDER_SITE_OTHER): Payer: 59 | Admitting: Family Medicine

## 2014-07-28 VITALS — BP 117/69 | HR 74 | Temp 98.5°F | Ht 62.5 in | Wt 134.6 lb

## 2014-07-28 DIAGNOSIS — I10 Essential (primary) hypertension: Secondary | ICD-10-CM

## 2014-07-28 DIAGNOSIS — E08311 Diabetes mellitus due to underlying condition with unspecified diabetic retinopathy with macular edema: Secondary | ICD-10-CM

## 2014-07-28 DIAGNOSIS — E1021 Type 1 diabetes mellitus with diabetic nephropathy: Secondary | ICD-10-CM

## 2014-07-28 LAB — POCT GLYCOSYLATED HEMOGLOBIN (HGB A1C): Hemoglobin A1C: 7.5

## 2014-07-28 NOTE — Progress Notes (Signed)
Patient ID: [name], female   DOB: 04/01/1981, 33 y.o.   MRN: [id number]   Subjective:  [name] is a 33 y.o. female here for follow up.  She has been taking all medications as directed. BP has been well controlled though she feels like it is low some times. She describes this as feeling bad without ability to expound. Denies lightheadedness, dizzines, vision changes, palpitations, weakness, CP, SOB.   Reports having AM blood sugars ranging 20s to 200s with symptoms of weakness, sweating, and hunger with low readings. She denies changing insulin regimen at all and denies skipping any meals. She was able to drink juice with improvement. Pre-prandials are usually 90s-160s.   Had tetanus shot Nov 2013 Had pap smear Jan 2013  All other pertinent systems reviewed and are negative. Objective:  BP 117/69  Pulse 74  Temp(Src) 98.5 F (36.9 C) (Oral)  Ht 5' 2.5" (1.588 [name])  Wt 134 lb 9.6 oz (61.054 kg)  BMI 24.21 kg/m2  LMP 07/03/2014  Gen:  33 y.o. female in NAD HEENT: MMM, EOMI, R pupil ERRL, muddy R sclera. L eye prosthetic.  CV: RRR, no II/VI SEM at RUSB Resp: Non-labored, CTAB, no wheezes noted Abd: Soft, NTND, BS present, no guarding or organomegaly MSK: No edema noted, full ROM Neuro: Alert and oriented, speech normal Assessment & Plan:  [name] Kindler is a 33 y.o. female with:  Problem List Items Addressed This Visit     Cardiovascular and Mediastinum   Essential hypertension, benign     Endocrine   DM (diabetes mellitus), type 1 - Primary (Chronic)     Difficult to control without hypoglycemia. Seems to be taking disease more seriously and improving compliance. Will see if endocrinology has solution.  - Hb A1c today - Not terribly uncontrolled, but brittle.     Relevant Orders      Ambulatory referral to Endocrinology      HgB A1c (Completed)     Other   Diabetic retinopathy-Blind (Chronic)

## 2014-08-01 ENCOUNTER — Ambulatory Visit: Payer: 59 | Admitting: Cardiology

## 2014-08-02 ENCOUNTER — Other Ambulatory Visit: Payer: Self-pay | Admitting: Family Medicine

## 2014-08-02 ENCOUNTER — Other Ambulatory Visit: Payer: Self-pay | Admitting: *Deleted

## 2014-08-02 NOTE — Telephone Encounter (Signed)
Spoke with patient and informed her of below refill sent in by Dr. Jarvis NewcomerGrunz

## 2014-08-02 NOTE — Telephone Encounter (Signed)
Also received a request for Lantus SoloStar Pen.  Lantus vials are listed on medication list.  Called pt to ask if she prefers vials or the pen.  Pt prefers the SoloStar Pen.  Clovis PuMartin, Tamika L, RN

## 2014-08-02 NOTE — Assessment & Plan Note (Signed)
Difficult to control without hypoglycemia. Seems to be taking disease more seriously and improving compliance. Will see if endocrinology has solution.  - Hb A1c today - Not terribly uncontrolled, but brittle.

## 2014-08-03 MED ORDER — CARVEDILOL 25 MG PO TABS: 25.0000 mg | ORAL_TABLET | Freq: Two times a day (BID) | ORAL | Status: DC

## 2014-08-03 MED ORDER — INSULIN GLARGINE 100 UNIT/ML SOLOSTAR PEN: 14.0000 [IU] | PEN_INJECTOR | Freq: Two times a day (BID) | SUBCUTANEOUS | Status: DC

## 2014-08-03 MED ORDER — LISINOPRIL 40 MG PO TABS: 40.0000 mg | ORAL_TABLET | Freq: Every day | ORAL | Status: DC

## 2014-08-09 ENCOUNTER — Encounter: Payer: Self-pay | Admitting: Endocrinology

## 2014-08-09 ENCOUNTER — Ambulatory Visit (INDEPENDENT_AMBULATORY_CARE_PROVIDER_SITE_OTHER): Payer: 59 | Admitting: Endocrinology

## 2014-08-09 VITALS — BP 122/84 | HR 67 | Temp 98.0°F | Resp 14 | Ht 62.5 in | Wt 135.6 lb

## 2014-08-09 DIAGNOSIS — E1065 Type 1 diabetes mellitus with hyperglycemia: Secondary | ICD-10-CM

## 2014-08-09 DIAGNOSIS — R531 Weakness: Secondary | ICD-10-CM

## 2014-08-09 DIAGNOSIS — IMO0002 Reserved for concepts with insufficient information to code with codable children: Secondary | ICD-10-CM

## 2014-08-09 NOTE — Progress Notes (Signed)
Patient ID: [name], female   DOB: 08/10/1981, 33 y.o.   MRN: [id number]       Reason for Appointment : Consultation for Type 1 Diabetes  History of Present Illness          Diagnosis: Type 1 diabetes mellitus, date of diagnosis:  1996       Previous history:  She was started on Lantus insulin and NovoLog about 17 years ago She has generally been followed by her primary care physician for her diabetes Apparently she had not been very motivated to take care of her diabetes and the first few years and had poor control Her A1c in 2014 was 8.4, previous levels are not readily available for review  INSULIN regimen is described as: Lantus 10 unit at 9 am and 13 units at 9 pm Novolog 6 units + correction dose of 1 unit: 50   Recent history:  Her insulin doses have been adjusted periodically by her primary care physician Taking relatively lower doses of Lantus in the morning compared to the evening She had her evening Lantus was reduced by one unit a few weeks ago when her morning sugars were relatively low She is checking her blood sugars before each meal but not after supper At mealtimes she is taking a fixed dose of NovoLog and only extra insulin if the blood sugar is high She does not adjust her mealtime dose based on her meal content.  She thinks she is eating fairly consistent quantities of food at each meal. Her main problem is occasional episodes of relatively high readings on waking up and subsequently getting low sugars around lunchtime when she does her correction dose. Later in the day her blood sugars will be significantly high Otherwise she thinks her blood sugars are fairly well controlled Unable to get a proper idea of her blood sugar levels and patterns since she has only some readings from last month recorded on her phone    Glucose monitoring:  done times a day         Glucometer: Prodigy    Blood Glucose readings from meter download:  PREMEAL Breakfast Lunch Dinner  Bedtime Overall  Glucose range: 127-404 20-127 111-397 ?   Mean/median:        Hypoglycemia:  occurs 1/7 at lunch, some hs  Factors causing hyperglycemia: overcorrection Symptoms of hypoglycemia: sweating, flushed, slurring speech, feeling off balance, insomnia at night Treatment of hypoglycemia: juice         Self-care: The diet that the patient has been following is:  Meals: 3 meals per day. Oatmeal at breakfast with egg or meat or almonds          Exercise: none                 CDE consultation: ?     Diabetes labs:  Lab Results  Component Value Date   HGBA1C 7.5 07/28/2014   HGBA1C 7.3* 06/29/2014   HGBA1C 5.6 12/09/2013   Lab Results  Component Value Date   LDLCALC 108* 07/01/2014   CREATININE 5.99* 07/01/2014      Medication List       This list is accurate as of: 08/09/14 11:39 AM.  Always use your most recent med list.               amLODipine 10 MG tablet  Commonly known as:  NORVASC  Take 1 tablet (10 mg total) by mouth at bedtime.     aspirin 81  MG chewable tablet  Chew 1 tablet (81 mg total) by mouth daily.     atorvastatin 40 MG tablet  Commonly known as:  LIPITOR  Take 40 mg by mouth daily.     carvedilol 25 MG tablet  Commonly known as:  COREG  Take 1 tablet (25 mg total) by mouth 2 (two) times daily with a meal.     insulin aspart 100 UNIT/ML injection  Commonly known as:  novoLOG  Inject 6 Units into the skin 3 (three) times daily before meals. 6 units + sliding scale     Insulin Glargine 100 UNIT/ML Solostar Pen  Commonly known as:  LANTUS SOLOSTAR  Inject 14 Units into the skin 2 (two) times daily.     isosorbide mononitrate 30 MG 24 hr tablet  Commonly known as:  IMDUR  Take 1 tablet (30 mg total) by mouth daily.     levothyroxine 100 MCG tablet  Commonly known as:  SYNTHROID, LEVOTHROID  Take 100 mcg by mouth daily before breakfast.     lisinopril 40 MG tablet  Commonly known as:  PRINIVIL,ZESTRIL  Take 1 tablet (40 mg total)  by mouth daily.     pantoprazole 40 MG tablet  Commonly known as:  PROTONIX  Take 1 tablet (40 mg total) by mouth daily.        Allergies: No Known Allergies  Past Medical History  Diagnosis Date  . 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 Hypothyroidism   . Hypertension   . Type I diabetes mellitus   . DVT (deep venous thrombosis)     "both arms" (11/11/2013)  . High cholesterol   . Diabetic retinopathy associated with type 1 diabetes mellitus   . Heart murmur     "slight" (11/11/2013)  . Graves' disease     "had thyroid radioactively treated" (11/11/2013)  . End-stage renal disease on hemodialysis     "TTS; [place] [facility]" (11/11/2013)    Past Surgical History  Procedure Laterality Date  . Insertion of dialysis catheter N/A 04/01/2013    Procedure: INSERTION OF DIALYSIS CATHETER;  Surgeon: Pryor OchoaJames D Lawson, MD;  [facility];  Service: Vascular;  Laterality: N/A;  . Av fistula placement Right 04/07/2013    Procedure: INSERTION OF ARTERIOVENOUS (AV) GORE-TEX GRAFT THIGH;  Surgeon: Chuck Hinthristopher S Dickson, MD;  [facility];  Service: Vascular;  Laterality: Right;  . Cataract extraction w/ intraocular lens implant Right 2009  . Eye surgery Right 2009    "laser OR for diabetic retinopathy" (05/23/2013)  . Enucleation Left ~ 2010  . Revision of arteriovenous goretex graft Right 11/11/2013    Procedure: REVISION OF ARTERIOVENOUS GORETEX GRAFT - RIGHT THIGH;  Surgeon: Chuck Hinthristopher S Dickson, MD;  [facility];  Service: Vascular;  Laterality: Right;    Family History  Problem Relation Age of Onset  . Hypertension    . Cancer    . Thyroid disease    . Cancer Mother   . Asthma Father     Social History:  reports that she has never smoked. She has never used smokeless tobacco. She reports that she does not drink alcohol or use illicit drugs.    Review of Systems       For the last month or so she has episodes of generalized weakness about every 2-3 days.  This  can occur at any time and makes her feel like her muscles are very heavy.  She also feels  a little nauseous but not typically lightheaded.  Does not have any sweating with this and has not had any low sugars at the same time.  Also her blood pressure is usually normal to slightly high when this happened.  This can happen around the time of her dialysis also.      Lipids: currently being treated with Lipitor 40 mg  Lab Results  Component Value Date   CHOL 188 07/01/2014   HDL 59 07/01/2014   LDLCALC 108* 07/01/2014   LDLDIRECT 85 06/10/2013   TRIG 104 07/01/2014   CHOLHDL 3.2 07/01/2014        Headaches: None             Skin: No rash or infections     Thyroid:  No  unusual fatigue except for periodic weakness as above She was treated with radioactive iodine in 2011 for Graves' disease and her thyroid supplements are being adjusted by the PCP1 no recent changes made  Lab Results  Component Value Date   FREET4 0.64* 03/31/2013   TSH 3.490 06/29/2014   TSH 0.576 12/09/2013   TSH 6.993* 06/10/2013       The blood pressure has been treated with multiple medications.  She was getting markedly increased blood pressure readings up to 200 systolic and this is better with taking Amaryl all.  However occasionally it tends to go up also.  This is not associated with any palpitations or rapid heart rate and no associated flushing  Dialysis  Was started in June 2014 for end-stage kidney disease related to diabetes     No swelling of feet.     No shortness of breath on exertion.     Bowel habits:  occasional contipation       No urinary difficulties      No joint  Pains.     Sweating spells occur occasionally without change in blood sugar         No history of Numbness, tingling or burning in feet      Her menses are quite regular although lighter   LABS:  No visits with results within 1 Week(s) from this visit. Latest known visit with results is:  Office Visit on 07/28/2014    Component Date Value Ref Range Status  . Hemoglobin A1C 07/28/2014 7.5   Final    Physical Examination:  BP 122/84 mmHg  Pulse 67  Temp(Src) 98 F (36.7 C)  Resp 14  Ht 5' 2.5" (1.588 m)  Wt 135 lb 9.6 oz (61.508 kg)  BMI 24.39 kg/m2  SpO2 97%  Standing blood pressure 128/78   GENERAL:  averagely built and nourished HEENT:         Eye exam shows normal external appearance. Fundus exam not indicated. Oral exam shows normal mucosa .  NECK:         General:  Neck exam shows no lymphadenopathy. Carotids are normal to palpation and no bruit heard.  Thyroid is not enlarged and no nodules felt.   LUNGS:         Chest is symmetrical. Lungs are clear to auscultation.Marland Kitchen   HEART:         Heart sounds:  S1 and S2 are normal. No murmurs or clicks heard., no S3 or S4.   ABDOMEN:  no distention present. Liver and spleen are not palpable. No other mass or tenderness present.  EXTREMITIES:     There is no edema. No skin lesions present.Marland Kitchen  NEUROLOGICAL:  Vibration sense is mildly reduced in toes. Ankle jerks are absent bilaterally.          Diabetic foot exam shows normal monofilament sensation in the toes and plantar surfaces, no skin lesions or ulcers on the feet and normal pedal pulses MUSCULOSKELETAL:       There is no enlargement or deformity of the joints.  SKIN:       No rash, lesions or abnormal pigmentation       ASSESSMENT:  Diabetes type 1, long-standing and poorly controlled Although she claims her blood sugars are fairly good most of the time she has significant variability as judged by her recent blood sugars.  However unable to analyze her blood sugars adequately since she is not keeping a proper record of her blood sugars and does not have a monitor that can be downloaded  Problems identified:   labile blood sugars partly related to inherit nature of her diabetes and also due to not adjusting her mealtime insulin based on carbohydrate intake and meal size  Tendency to  hypoglycemia when she does correction doses especially for high readings in the mornings  Not monitoring any postprandial readings to help adjust her mealtime coverage, especially at suppertime  May be getting significant hyperglycemia after her evening meals at times related to the type of meal she is havingespecially high fat meals  She may have some variability in her day-to-day blood sugars based on how the Lantus pharmacokinetics are working for her  Complications:end-stage renal failure, retinopathy causing blindness. No clinical evidence of neuropathy and no orthostatic hypotension suggestive of autonomic neuropathy However she has some episodes of diaphoresis possibly related to autonomic neuropathy  Weakness episodes: these do not appear to be related to hypoglycemia or hypotension and unable to explain these based on any endocrine problem. She does not have any typical symptoms of adrenal insufficiency but because of the associated nausea will need to rule this out.  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: Last TSH was normal and will continue the same dose   PLAN:   She will keep a record of her blood sugars for review  She will try to get software for her Prodigy monitor to download this  Alternative may need to try a Verio Sync monitor that she can link to her phone with an audio response and the meter can be downloaded   For now will continue the same dose of Lantus twice a day  Consider using Toujeo for more consistent 24-hour control with single dose and potentially less nocturnal hypoglycemia; Insurance coverage may be an issue for this  Reduce correction factor for morning NovoLog to 1:75 to avoid hypoglycemia  More consistent protein intake at breakfast  Start monitoring blood sugars periodically after meals especially dinner  She may need  extra 2-3 units NovoLog insulin for higher fat meals and also correction doses later at night  She may benefit from consultation with nurse educator  Cortrosyn stimulation test to be scheduled in the morning  Counseling time over 50% of today's 60 minute visit   [name] 08/09/2014, 11:39 AM   Note: This note was prepared with Dragon voice recognition system technology. Any transcriptional errors that result from this process are unintentional.

## 2014-08-09 NOTE — Patient Instructions (Signed)
Correction factor 1:75 at breakfast   Check sugar consistently at bedtime  May need to take extra Novolog at dinner fpr higher fat meals

## 2014-08-11 ENCOUNTER — Other Ambulatory Visit: Payer: Medicare Other

## 2014-09-06 ENCOUNTER — Ambulatory Visit: Payer: Medicare Other | Admitting: Endocrinology

## 2014-09-08 ENCOUNTER — Other Ambulatory Visit: Payer: Self-pay | Admitting: Family Medicine

## 2014-09-14 ENCOUNTER — Encounter (HOSPITAL_COMMUNITY): Payer: Self-pay | Admitting: Vascular Surgery

## 2014-09-18 ENCOUNTER — Encounter: Payer: Self-pay | Admitting: Family Medicine

## 2014-09-18 ENCOUNTER — Ambulatory Visit (INDEPENDENT_AMBULATORY_CARE_PROVIDER_SITE_OTHER): Payer: 59 | Admitting: Family Medicine

## 2014-09-18 VITALS — BP 128/75 | HR 74 | Temp 98.3°F | Ht 63.0 in | Wt 139.5 lb

## 2014-09-18 DIAGNOSIS — G629 Polyneuropathy, unspecified: Secondary | ICD-10-CM

## 2014-09-18 MED ORDER — GABAPENTIN 300 MG PO CAPS: 300.0000 mg | ORAL_CAPSULE | Freq: Three times a day (TID) | ORAL | Status: DC

## 2014-09-18 MED ORDER — GABAPENTIN 300 MG PO CAPS
300.0000 mg | ORAL_CAPSULE | Freq: Three times a day (TID) | ORAL | Status: DC
Start: 2014-09-18 — End: 2014-09-27

## 2014-09-19 DIAGNOSIS — G629 Polyneuropathy, unspecified: Secondary | ICD-10-CM | POA: Insufficient documentation

## 2014-09-19 NOTE — Progress Notes (Signed)
   Subjective:  [name] is a 33 y.o. female presenting for multiple pain complaints.  Per Ms. Powe, her primary concern is the shooting back pain in the mid-lower back that radiates into "where the spine and pelvis join" - this is what she means by "pelvic pain." This is for the past 2 weeks, remaining the same moderate intensity, not provoked by any position, dialysis, or palpation. No fevers, abd pain N/V/D, vaginal discharge or bleeding, dysuria, flank tenderness. Pt denies any current bowel/bladder problems, fever, chills, unintentional weight loss, night time awakenings secondary to pain, weakness in one or both legs.  - Review of Systems: Per HPI.  - Smoking status noted Objective:  BP 128/75 mmHg  Pulse 74  Temp(Src) 98.3 F (36.8 C) (Oral)  Ht 5\' 3"  (1.6 m)  Wt 139 lb 8 oz (63.277 kg)  BMI 24.72 kg/m2 Gen: Pleasant, blind 33 y.o. female in no distress Back:  Normal skin. Spine with normal alignment and no deformity. No tenderness to vertebral process palpation. Paraspinous muscles are not tender and without spasm.   Range of motion is full at neck and lumbar sacral regions. Straight leg raise is negative. Neuro:  Sensation and motor function 5/5 bilateral lower extremities. Patellar and achilles DTR's 2+   Assessment/Plan:  [name] is a 33 y.o. female here for atypical back pain.  Problem List Items Addressed This Visit      Nervous and Auditory   Peripheral polyneuropathy - Primary    Uncertain diagnosis, will treat empirically with gabapentin, titrating up dose. This may be due to diaylsis-related electrolyte shifts or even have components of somatization as they have no discernible anatomic or pathophysiologic patterns.

## 2014-09-19 NOTE — Assessment & Plan Note (Signed)
Uncertain diagnosis, will treat empirically with gabapentin, titrating up dose. This may be due to diaylsis-related electrolyte shifts or even have components of somatization as they have no discernible anatomic or pathophysiologic patterns.

## 2014-09-25 ENCOUNTER — Encounter (HOSPITAL_COMMUNITY): Payer: Self-pay | Admitting: *Deleted

## 2014-09-25 ENCOUNTER — Inpatient Hospital Stay (HOSPITAL_COMMUNITY)
Admission: EM | Admit: 2014-09-25 | Discharge: 2014-10-03 | DRG: 885 | Disposition: A | Discharge status: Other Acute Inpatient Hospital | Payer: 59 | Attending: Family Medicine | Admitting: Family Medicine

## 2014-09-25 DIAGNOSIS — Z9889 Other specified postprocedural states: Secondary | ICD-10-CM

## 2014-09-25 DIAGNOSIS — Z86718 Personal history of other venous thrombosis and embolism: Secondary | ICD-10-CM

## 2014-09-25 DIAGNOSIS — R45851 Suicidal ideations: Secondary | ICD-10-CM | POA: Diagnosis present

## 2014-09-25 DIAGNOSIS — R011 Cardiac murmur, unspecified: Secondary | ICD-10-CM | POA: Diagnosis present

## 2014-09-25 DIAGNOSIS — E039 Hypothyroidism, unspecified: Secondary | ICD-10-CM | POA: Diagnosis present

## 2014-09-25 DIAGNOSIS — D631 Anemia in chronic kidney disease: Secondary | ICD-10-CM | POA: Diagnosis present

## 2014-09-25 DIAGNOSIS — H540 Blindness, both eyes: Secondary | ICD-10-CM | POA: Diagnosis present

## 2014-09-25 DIAGNOSIS — E780 Pure hypercholesterolemia: Secondary | ICD-10-CM | POA: Diagnosis present

## 2014-09-25 DIAGNOSIS — N186 End stage renal disease: Secondary | ICD-10-CM | POA: Diagnosis present

## 2014-09-25 DIAGNOSIS — R1031 Right lower quadrant pain: Secondary | ICD-10-CM | POA: Diagnosis present

## 2014-09-25 DIAGNOSIS — E10319 Type 1 diabetes mellitus with unspecified diabetic retinopathy without macular edema: Secondary | ICD-10-CM | POA: Diagnosis present

## 2014-09-25 DIAGNOSIS — I10 Essential (primary) hypertension: Secondary | ICD-10-CM | POA: Diagnosis present

## 2014-09-25 DIAGNOSIS — G629 Polyneuropathy, unspecified: Secondary | ICD-10-CM | POA: Diagnosis present

## 2014-09-25 DIAGNOSIS — Z7982 Long term (current) use of aspirin: Secondary | ICD-10-CM | POA: Diagnosis not present

## 2014-09-25 DIAGNOSIS — Z794 Long term (current) use of insulin: Secondary | ICD-10-CM

## 2014-09-25 DIAGNOSIS — R4589 Other symptoms and signs involving emotional state: Secondary | ICD-10-CM | POA: Diagnosis present

## 2014-09-25 DIAGNOSIS — Z79899 Other long term (current) drug therapy: Secondary | ICD-10-CM

## 2014-09-25 DIAGNOSIS — Z8249 Family history of ischemic heart disease and other diseases of the circulatory system: Secondary | ICD-10-CM | POA: Diagnosis not present

## 2014-09-25 DIAGNOSIS — Z992 Dependence on renal dialysis: Secondary | ICD-10-CM | POA: Diagnosis not present

## 2014-09-25 DIAGNOSIS — R4689 Other symptoms and signs involving appearance and behavior: Secondary | ICD-10-CM

## 2014-09-25 DIAGNOSIS — F332 Major depressive disorder, recurrent severe without psychotic features: Principal | ICD-10-CM | POA: Diagnosis present

## 2014-09-25 DIAGNOSIS — M25551 Pain in right hip: Secondary | ICD-10-CM | POA: Diagnosis present

## 2014-09-25 DIAGNOSIS — M25559 Pain in unspecified hip: Secondary | ICD-10-CM | POA: Insufficient documentation

## 2014-09-25 DIAGNOSIS — I120 Hypertensive chronic kidney disease with stage 5 chronic kidney disease or end stage renal disease: Secondary | ICD-10-CM | POA: Diagnosis present

## 2014-09-25 DIAGNOSIS — E109 Type 1 diabetes mellitus without complications: Secondary | ICD-10-CM | POA: Diagnosis present

## 2014-09-25 LAB — CBC
HCT: 28.1 % — ABNORMAL LOW (ref 36.0–46.0)
Hemoglobin: 9.8 g/dL — ABNORMAL LOW (ref 12.0–15.0)
MCH: 31.3 pg (ref 26.0–34.0)
MCHC: 34.9 g/dL (ref 30.0–36.0)
MCV: 89.8 fL (ref 78.0–100.0)
PLATELETS: 188 10*3/uL (ref 150–400)
RBC: 3.13 MIL/uL — AB (ref 3.87–5.11)
RDW: 12.2 % (ref 11.5–15.5)
WBC: 5.5 10*3/uL (ref 4.0–10.5)

## 2014-09-25 LAB — COMPREHENSIVE METABOLIC PANEL
ALT: 9 U/L (ref 0–35)
AST: 10 U/L (ref 0–37)
Albumin: 4.1 g/dL (ref 3.5–5.2)
Alkaline Phosphatase: 105 U/L (ref 39–117)
Anion gap: 20 — ABNORMAL HIGH (ref 5–15)
BUN: 46 mg/dL — ABNORMAL HIGH (ref 6–23)
CALCIUM: 9.7 mg/dL (ref 8.4–10.5)
CO2: 23 meq/L (ref 19–32)
Chloride: 92 mEq/L — ABNORMAL LOW (ref 96–112)
Creatinine, Ser: 10.65 mg/dL — ABNORMAL HIGH (ref 0.50–1.10)
GFR, EST AFRICAN AMERICAN: 5 mL/min — AB (ref >90)
GFR, EST NON AFRICAN AMERICAN: 4 mL/min — AB (ref >90)
GLUCOSE: 369 mg/dL — AB (ref 70–99)
Potassium: 5.3 mEq/L (ref 3.7–5.3)
Sodium: 135 mEq/L — ABNORMAL LOW (ref 137–147)
Total Bilirubin: 0.4 mg/dL (ref 0.3–1.2)
Total Protein: 8.6 g/dL — ABNORMAL HIGH (ref 6.0–8.3)

## 2014-09-25 LAB — SALICYLATE LEVEL

## 2014-09-25 LAB — RAPID URINE DRUG SCREEN, HOSP PERFORMED
Amphetamines: NOT DETECTED
Barbiturates: NOT DETECTED
Benzodiazepines: NOT DETECTED
Cocaine: NOT DETECTED
Opiates: NOT DETECTED
Tetrahydrocannabinol: NOT DETECTED

## 2014-09-25 LAB — CBG MONITORING, ED: GLUCOSE-CAPILLARY: 389 mg/dL — AB (ref 70–99)

## 2014-09-25 LAB — ACETAMINOPHEN LEVEL: Acetaminophen (Tylenol), Serum: <15 ug/mL (ref 10–30)

## 2014-09-25 LAB — GLUCOSE, CAPILLARY: Glucose-Capillary: 497 mg/dL — ABNORMAL HIGH (ref 70–99)

## 2014-09-25 LAB — ETHANOL

## 2014-09-25 MED ORDER — INSULIN ASPART 100 UNIT/ML ~~LOC~~ SOLN
0.0000 [IU] | Freq: Three times a day (TID) | SUBCUTANEOUS | Status: DC
  Administered 2014-09-27: 5 [IU] via SUBCUTANEOUS
  Administered 2014-09-27: 3 [IU] via SUBCUTANEOUS
  Administered 2014-09-28: 2 [IU] via SUBCUTANEOUS
  Administered 2014-09-28: 1 [IU] via SUBCUTANEOUS
  Administered 2014-09-29: 2 [IU] via SUBCUTANEOUS
  Administered 2014-09-29: 1 [IU] via SUBCUTANEOUS
  Administered 2014-09-29: 2 [IU] via SUBCUTANEOUS
  Administered 2014-09-30: 9 [IU] via SUBCUTANEOUS
  Administered 2014-10-01 – 2014-10-02 (×2): 5 [IU] via SUBCUTANEOUS
  Administered 2014-10-03: 1 [IU] via SUBCUTANEOUS

## 2014-09-25 MED ORDER — GABAPENTIN 300 MG PO CAPS
300.0000 mg | ORAL_CAPSULE | Freq: Every day | ORAL | Status: DC
  Administered 2014-09-26 – 2014-09-27 (×2): 300 mg via ORAL
  Filled 2014-09-25 (×3): qty 1

## 2014-09-25 MED ORDER — LISINOPRIL 20 MG PO TABS
20.0000 mg | ORAL_TABLET | Freq: Two times a day (BID) | ORAL | Status: DC
  Administered 2014-09-25 – 2014-10-02 (×13): 20 mg via ORAL
  Filled 2014-09-25 (×17): qty 1

## 2014-09-25 MED ORDER — INSULIN GLARGINE 100 UNIT/ML ~~LOC~~ SOLN
15.0000 [IU] | Freq: Every day | SUBCUTANEOUS | Status: DC
  Administered 2014-09-25: 15 [IU] via SUBCUTANEOUS
  Filled 2014-09-25 (×2): qty 0.15

## 2014-09-25 MED ORDER — INSULIN ASPART 100 UNIT/ML ~~LOC~~ SOLN: 0.0000 [IU] | Freq: Three times a day (TID) | SUBCUTANEOUS | Status: DC

## 2014-09-25 MED ORDER — PANTOPRAZOLE SODIUM 40 MG PO TBEC
40.0000 mg | DELAYED_RELEASE_TABLET | Freq: Every day | ORAL | Status: DC
  Filled 2014-09-25 (×6): qty 1

## 2014-09-25 MED ORDER — INSULIN ASPART 100 UNIT/ML ~~LOC~~ SOLN
0.0000 [IU] | Freq: Every day | SUBCUTANEOUS | Status: DC
  Administered 2014-09-25: 5 [IU] via SUBCUTANEOUS
  Administered 2014-09-26: 2 [IU] via SUBCUTANEOUS
  Administered 2014-09-27: 4 [IU] via SUBCUTANEOUS
  Administered 2014-09-28: 3 [IU] via SUBCUTANEOUS
  Administered 2014-09-29 – 2014-10-03 (×2): 2 [IU] via SUBCUTANEOUS

## 2014-09-25 MED ORDER — LEVOTHYROXINE SODIUM 100 MCG PO TABS
100.0000 ug | ORAL_TABLET | Freq: Every day | ORAL | Status: DC
  Administered 2014-09-26 – 2014-10-03 (×7): 100 ug via ORAL
  Filled 2014-09-25 (×10): qty 1

## 2014-09-25 MED ORDER — INSULIN GLARGINE 100 UNIT/ML ~~LOC~~ SOLN: 15.0000 [IU] | Freq: Every day | SUBCUTANEOUS | Status: DC

## 2014-09-25 MED ORDER — CARVEDILOL 25 MG PO TABS
25.0000 mg | ORAL_TABLET | Freq: Every day | ORAL | Status: DC
  Filled 2014-09-25 (×2): qty 1

## 2014-09-25 MED ORDER — HEPARIN SODIUM (PORCINE) 5000 UNIT/ML IJ SOLN
5000.0000 [IU] | Freq: Three times a day (TID) | INTRAMUSCULAR | Status: DC
  Administered 2014-09-25 – 2014-10-01 (×18): 5000 [IU] via SUBCUTANEOUS
  Filled 2014-09-25 (×26): qty 1

## 2014-09-25 MED ORDER — AMLODIPINE BESYLATE 10 MG PO TABS
10.0000 mg | ORAL_TABLET | Freq: Every day | ORAL | Status: DC
  Administered 2014-09-25 – 2014-10-03 (×8): 10 mg via ORAL
  Filled 2014-09-25 (×9): qty 1
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
## 2014-09-25 NOTE — Progress Notes (Signed)
New Admission Note:   Ar[facility] Mental Orientation: Alert and oriented x4 Telemetry: N/A Assessment: Completed Skin: See doc flowsheet IV: No PIV Pain: 5/10 pelvis Tubes: N/A Safety Measures: Safety Fall Prevention Plan has been given, discussed and signed Admission: Completed [address] Orientation: Patient has been orientated to the room, unit and staff.  Family:  Orders have been reviewed and implemented. Will continue to monitor the patient. Call light has been placed within reach and bed alarm has been activated.   Toll BrothersCharito Linzy Darling BSN, RN-BC Phone number: [phone]

## 2014-09-25 NOTE — ED Notes (Signed)
Pt is blind and on dialysis. Pt has been on dialysis 1.5 years. Has been thinking about death for 9 months. Pt was scent over from dialysis center because she told them she no longer want to do dialysis. Now dialysis center wants pt to have psych eval. Pt understands that stopping dialysis will eventually kill her, goal is not for death, but she is tired of all treatment. Pt reports if she was suicidal she would just cut her "dialysis access and bleed out in 3 minutes".  At present pt reports chronic pain 4/10. Denies SI/HI, or AH.
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
## 2014-09-25 NOTE — ED Notes (Signed)
[facility] called [facility]

## 2014-09-25 NOTE — Progress Notes (Signed)
Patient's cbg=[id number],MD on call notified.Order received to give lantus. [name], [name] [name], [name] fundraiser

## 2014-09-25 NOTE — ED Notes (Signed)
Dr. Gwendolyn GrantWalden notified of pt's CBG of 389. No additional orders received at this time.
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
## 2014-09-25 NOTE — BH Assessment (Signed)
Per Catha NottinghamJamison, the ER MD will be admitting the patient to the medical floor.

## 2014-09-25 NOTE — ED Notes (Signed)
Bed: WA29 Expected date:  Expected time:  Means of arrival:  Comments: [name]

## 2014-09-25 NOTE — ED Notes (Signed)
[facility] arrived to transport pt to [place] 

## 2014-09-26 DIAGNOSIS — F489 Nonpsychotic mental disorder, unspecified: Secondary | ICD-10-CM

## 2014-09-26 DIAGNOSIS — I10 Essential (primary) hypertension: Secondary | ICD-10-CM

## 2014-09-26 DIAGNOSIS — E038 Other specified hypothyroidism: Secondary | ICD-10-CM

## 2014-09-26 DIAGNOSIS — Z992 Dependence on renal dialysis: Secondary | ICD-10-CM

## 2014-09-26 DIAGNOSIS — N186 End stage renal disease: Secondary | ICD-10-CM

## 2014-09-26 DIAGNOSIS — E1021 Type 1 diabetes mellitus with diabetic nephropathy: Secondary | ICD-10-CM

## 2014-09-26 DIAGNOSIS — F332 Major depressive disorder, recurrent severe without psychotic features: Secondary | ICD-10-CM | POA: Diagnosis present

## 2014-09-26 LAB — RENAL FUNCTION PANEL
Albumin: 3.5 g/dL (ref 3.5–5.2)
Anion gap: 16 — ABNORMAL HIGH (ref 5–15)
BUN: 58 mg/dL — ABNORMAL HIGH (ref 6–23)
CO2: 23 mmol/L (ref 19–32)
Calcium: 8.6 mg/dL (ref 8.4–10.5)
Chloride: 94 mEq/L — ABNORMAL LOW (ref 96–112)
Creatinine, Ser: 12.38 mg/dL — ABNORMAL HIGH (ref 0.50–1.10)
GFR calc Af Amer: 4 mL/min — ABNORMAL LOW (ref >90)
GFR calc non Af Amer: 4 mL/min — ABNORMAL LOW (ref >90)
Glucose, Bld: 134 mg/dL — ABNORMAL HIGH (ref 70–99)
Phosphorus: 6 mg/dL — ABNORMAL HIGH (ref 2.3–4.6)
Potassium: 4.5 mmol/L (ref 3.5–5.1)
Sodium: 133 mmol/L — ABNORMAL LOW (ref 135–145)

## 2014-09-26 LAB — GLUCOSE, CAPILLARY
GLUCOSE-CAPILLARY: 93 mg/dL (ref 70–99)
GLUCOSE-CAPILLARY: 110 mg/dL — AB (ref 70–99)
GLUCOSE-CAPILLARY: 114 mg/dL — AB (ref 70–99)
GLUCOSE-CAPILLARY: 217 mg/dL — AB (ref 70–99)
Glucose-Capillary: 66 mg/dL — ABNORMAL LOW (ref 70–99)

## 2014-09-26 LAB — MRSA PCR SCREENING: MRSA by PCR: NEGATIVE

## 2014-09-26 MED ORDER — HEPARIN SODIUM (PORCINE) 1000 UNIT/ML DIALYSIS: 20.0000 [IU]/kg | INTRAMUSCULAR | Status: DC | PRN

## 2014-09-26 MED ORDER — HEPARIN SODIUM (PORCINE) 1000 UNIT/ML DIALYSIS: 1000.0000 [IU] | INTRAMUSCULAR | Status: DC | PRN

## 2014-09-26 MED ORDER — SODIUM CHLORIDE 0.9 % IV SOLN: 100.0000 mL | INTRAVENOUS | Status: DC | PRN

## 2014-09-26 MED ORDER — RENA-VITE PO TABS
1.0000 | ORAL_TABLET | Freq: Every day | ORAL | Status: DC
  Administered 2014-09-26 – 2014-10-03 (×6): 1 via ORAL
  Filled 2014-09-26 (×9): qty 1

## 2014-09-26 MED ORDER — CARVEDILOL PHOSPHATE ER 20 MG PO CP24
20.0000 mg | ORAL_CAPSULE | Freq: Every day | ORAL | Status: DC
  Administered 2014-09-27 – 2014-10-03 (×6): 20 mg via ORAL
  Filled 2014-09-26 (×8): qty 1

## 2014-09-26 MED ORDER — DOXERCALCIFEROL 4 MCG/2ML IV SOLN
INTRAVENOUS | Status: AC
  Filled 2014-09-26: qty 2

## 2014-09-26 MED ORDER — DARBEPOETIN ALFA 25 MCG/0.42ML IJ SOSY
PREFILLED_SYRINGE | INTRAMUSCULAR | Status: AC
  Filled 2014-09-26: qty 0.42

## 2014-09-26 MED ORDER — CARVEDILOL 12.5 MG PO TABS
12.5000 mg | ORAL_TABLET | Freq: Two times a day (BID) | ORAL | Status: DC
  Administered 2014-09-26: 12.5 mg via ORAL
  Filled 2014-09-26 (×2): qty 1

## 2014-09-26 MED ORDER — PENTAFLUOROPROP-TETRAFLUOROETH EX AERO: 1.0000 "application " | INHALATION_SPRAY | CUTANEOUS | Status: DC | PRN

## 2014-09-26 MED ORDER — LIDOCAINE HCL (PF) 1 % IJ SOLN: 5.0000 mL | INTRAMUSCULAR | Status: DC | PRN

## 2014-09-26 MED ORDER — INSULIN GLARGINE 100 UNIT/ML ~~LOC~~ SOLN
10.0000 [IU] | Freq: Two times a day (BID) | SUBCUTANEOUS | Status: DC
Start: 2014-09-26 — End: 2014-10-04
  Administered 2014-09-26: 10 [IU] via SUBCUTANEOUS
  Administered 2014-09-26: 5 [IU] via SUBCUTANEOUS
  Administered 2014-09-27 (×2): 10 [IU] via SUBCUTANEOUS
  Administered 2014-09-28: 6 [IU] via SUBCUTANEOUS
  Administered 2014-09-28 – 2014-10-03 (×9): 10 [IU] via SUBCUTANEOUS
  Administered 2014-10-03: 3 [IU] via SUBCUTANEOUS
  Filled 2014-09-26 (×17): qty 0.1

## 2014-09-26 MED ORDER — ACETAMINOPHEN 325 MG PO TABS
ORAL_TABLET | ORAL | Status: AC
  Administered 2014-09-26: 650 mg
  Filled 2014-09-26: qty 2

## 2014-09-26 MED ORDER — LIDOCAINE-PRILOCAINE 2.5-2.5 % EX CREA: 1.0000 "application " | TOPICAL_CREAM | CUTANEOUS | Status: DC | PRN

## 2014-09-26 MED ORDER — NEPRO/CARBSTEADY PO LIQD
237.0000 mL | ORAL | Status: DC | PRN
  Filled 2014-09-26: qty 237

## 2014-09-26 MED ORDER — DARBEPOETIN ALFA 25 MCG/0.42ML IJ SOSY
25.0000 ug | PREFILLED_SYRINGE | INTRAMUSCULAR | Status: DC
  Administered 2014-09-26: 25 ug via INTRAVENOUS
  Filled 2014-09-26: qty 0.42

## 2014-09-26 MED ORDER — DOXERCALCIFEROL 4 MCG/2ML IV SOLN
3.0000 ug | INTRAVENOUS | Status: DC
  Administered 2014-09-26 – 2014-10-03 (×4): 3 ug via INTRAVENOUS
  Filled 2014-09-26 (×4): qty 2

## 2014-09-26 MED ORDER — ALTEPLASE 2 MG IJ SOLR: 2.0000 mg | Freq: Once | INTRAMUSCULAR | Status: DC | PRN

## 2014-09-26 MED ORDER — SODIUM CHLORIDE 0.9 % IV SOLN: 62.5000 mg | INTRAVENOUS | Status: DC

## 2014-09-26 NOTE — Discharge Summary (Signed)
Family Medicine [facility] Discharge Summary  Patient name: [name] Medical record number: [id number] Date of birth: 01/19/1981 Age: 33 y.o. Gender: female Date of Admission: 09/25/2014  Date of Discharge: 09/28/14 Admitting Physician: Susan PaganKehinde Eniola, MD  Primary Care Provider: Hazeline [name], Ryan, MD Consultants: Psychiatry  Indication for Hospitalization: suicidal behavior  Discharge Diagnoses/Problem List:  Suicidal behavior Recurrent major depression-severe ESRD T1DM Hypothyroidism HTN  Disposition: Discharge to behavioral health.  Discharge Condition: Medically stable.  Discharge Exam:  BP 165/84 mmHg  Pulse 68  Temp(Src) 98.8 F (37.1 C) (Oral)  Resp 20  Ht 5\' 3"  (1.6 m)  Wt 134 lb 14.7 oz (61.2 kg)  BMI 23.91 kg/m2  SpO2 100% General: well nourished female, lying in bed, NAD, sitter at bedside HEENT: [name]/AT, MMM, left eye prosthesis  Cardiovascular: RRR, S1/S2, blowing systolic murmur Respiratory: CTAB, no increased WOB Abd: soft, NTND, +BS, no masses palpated  Extremities: WWP, no edema Neuro: blind, otherwise no gross deficits, follows commands Psych: mood depressed, but overall improved   Brief Hospital Course:  Ms [name] is a 33 year old female with PMX of DMI, ESRD on HD, HTN, Hypothyroidism, and DVT that [facility] for [facility]ation.  On exam, patient was visibly depressed, expressing being tired of social situation with girlfriend.  In addition, she revealed ongoing sorrow regarding the recent passing of her mother from ovarian cancer.  Patient seen by spiritual care for additional support.  Psychiatry was consulted, who recommended that patient seek inpatient treatment.  BH was contacted and stated that HD would be available to patient during inpatient psychiatric treatment.  Patient was medically cleared earlier into her hospitalization by FPTS.  However, bed placement was difficult to obtain.  For this  reason the patient had a prolonged hospitalization.  On HD#7, patient was re-evaluated by Psychiatry who continued to recommend inpatient psychiatric treatment.  That evening, patient threatened to leave AMA and was evaluated by FPTS and deemed to be without capacity.  She was subsequently IVC'd.  On HD#8, patient refused all medications.  After multiple lengthy discussions, she was amenable to Insulin. Mood overall improved.  She was ultimately able to be discharged in medically stable condition to behavioral health at [facility], much thanks to the assistance of many involved.  Patient complained of R groin pain.  Because of the possibility of clotting near her HD site in her thigh, a LE doppler was obtained that showed no evidence of a DVT.  An xray of her Right hip was also obtained which was unrevealing.  Of note, patient revealed she was actively menstruating.  Pain resolved with completion of menstrual cycle.     Patient was also found to be anemic to hgb of 7.5.  She was transfused with pRBCs during dialysis for this, with good response.  Hgb increased to 10.5 and was 10.6 by day of discharge.  Patient was continued on home medications for T1DM, depression, and HTN.  On admission, patient's CBG was 497.  Patient reported not being compliant with insulin.  She was restarted on home regimen with an addition of night SSI.  BPs were also noted to be elevated.  However, patient responded well to restarting of home medication for HTN.  In addition, her HD was continued on her normal schedule.           Issues for Follow Up:   Compliance with home diabetic medications.    Follow up neuropathic pain.  Patient Rx'd Neurontin  300 TID initially.  She had not titrated beyond Neurontin 300mg  QD yet.  Per nephrology, no more than 300mg  DAILY in setting of ESRD.  Compliance with HD.  Patient to continue Hectorol and Aranesp per Nephrology.  Nephrology to dose.  Consider pelvic ultrasound if continued pelvic  pain.  Consider repeat CBC to f/u hgb outpatient  Patient will need Social Work to help with arranging [place]  Patient will be moving in with her sister [facility]  Significant Procedures: transfused 1 unit pRBCs during HD  Significant Labs and Imaging:   Recent Labs Lab 09/30/14 0642 10/01/14 0515 10/02/14 0814  WBC 3.9* 4.8 4.1  HGB 7.5* 10.5* 10.6*  HCT 20.8* 29.3* 30.1*  PLT 147* 151 157    Recent Labs Lab 09/27/14 0500 09/28/14 1000 09/30/14 0642 10/01/14 0515  NA 137 136 134* 138  K 3.7 4.6 3.7 3.7  CL 100 99 98 101  CO2 28 25 25 29   GLUCOSE 108* 177* 95 72  BUN 22 42* 34* 22  CREATININE 6.56* 10.24* 9.26* 5.91*  CALCIUM 8.8 8.6 8.6 8.9  PHOS 5.4* 6.1* 6.2*  --   ALBUMIN 3.1* 3.2* 3.2*  --    LE venous doppler: no DVT  Dg Hip Complete Right 09/27/2014 CLINICAL DATA: Right hip pain for 4 to 5 days. No known injury. EXAM: RIGHT HIP - COMPLETE 2+ VIEW COMPARISON: None. FINDINGS: No fracture or dislocation. Right hip joint spaces appear preserved. No evidence of avascular necrosis. Limited visualization of the pelvis and contralateral left hip is normal. There is ingested radiopaque debris within the descending colon. Regional soft tissues appear otherwise normal. IMPRESSION: No explanation for patient's right hip pain. Electronically Signed By: Simonne ComeJohn [name] M.D. On: 09/27/2014 16:28   Results/Tests Pending at Time of Discharge: none  Discharge Medications:    Medication List    TAKE these medications        amLODipine 10 MG tablet  Commonly known as:  NORVASC  Take 1 tablet (10 mg total) by mouth at bedtime.     aspirin 81 MG chewable tablet  Chew 1 tablet (81 mg total) by mouth daily.     carvedilol 25 MG tablet  Commonly known as:  COREG  Take 1 tablet (25 mg total) by mouth 2 (two) times daily with a meal.     gabapentin 300 MG capsule  Commonly known as:  NEURONTIN  Take 1 capsule (300 mg  total) by mouth daily.     insulin aspart 100 UNIT/ML injection  Commonly known as:  novoLOG  Inject 6 Units into the skin 3 (three) times daily before meals. 6 units + sliding scale     Insulin Glargine 100 UNIT/ML Solostar Pen  Commonly known as:  LANTUS SOLOSTAR  Inject 14 Units into the skin 2 (two) times daily.     isosorbide mononitrate 30 MG 24 hr tablet  Commonly known as:  IMDUR  Take 1 tablet (30 mg total) by mouth daily.     levothyroxine 100 MCG tablet  Commonly known as:  SYNTHROID, LEVOTHROID  Take 1 tablet by mouth  daily     lisinopril 20 MG tablet  Commonly known as:  PRINIVIL,ZESTRIL  Take 20 mg by mouth 2 (two) times daily.     multivitamin Tabs tablet  Take 1 tablet by mouth at bedtime.     pantoprazole 40 MG tablet  Commonly known as:  PROTONIX  Take 1 tablet (40 mg  total) by mouth daily.        Discharge Instructions: Please refer to Patient Instructions section of EMR for full details.  Patient was counseled important signs and symptoms that should prompt return to medical care, changes in medications, dietary instructions, activity restrictions, and follow up appointments.   Follow-Up Appointments: Follow-up Information    Follow up with Susan Junker, MD.   Specialty:  Family Medicine   Why:  once discharged from behavioral health   Contact information:   [address] [place] [address] [phone]       Delynn Flavin, MD 10/03/2014, 7:04 PM PGY-1, [place] [facility]  Charlane Ferretti, MD Family Medicine PGY-2 Please page or call with questions
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
## 2014-09-26 NOTE — Progress Notes (Signed)
Family Medicine Teaching Service Daily Progress Note Intern Pager: [phone]  Patient name: [name] Medical record number: [id number] Date of birth: 07/27/1981 Age: 33 y.o. Gender: female  Primary Care Provider: Hazeline JunkerGrunz, Ryan, MD Consultants: Renal, Psych  Code Status: Full   Pt Overview and Major Events to Date:  12/21: Admitted  12/22: Venous duplex. Psych.   Assessment and Plan: [name] is a 33 y.o. female presenting with SI . PMH is significant for T1DM, Diabetic retinopathy causing blindness, ESRD, HTN, hypothyroidism, peripheral polyneuropathy, h/o DVT of UE.   Suicidal Ideation/Grief: feelings unchanged and awaiting psych consult  - consider consulting Palliative if declared having capacity by Psych. Goals of care in terms of continuing HD.   -c/s to Psychiatry. To see 12/22 am -c/s to spiritual care -c/s to SW -Bedside sitter -vitals per floor protocol  Pain in right groin: reports as pain there for over a year. Doesn't like taking pain medication. Patient receives HD in right anterior thigh and could be clot or symptoms related to HD.  - Venous duplex.   - tylenol   ESRD on HD: receiving HD today. Renal informed.  -HD on T, Th, Sat  T1DM: 07/2014 A1c: 7.5. Brittle in nature and followed by Endocrinology.  -Monitor CBGs  - lantus decreased from home dose to 10 U BID  -sensitive SSI TID & HS  HTN: BP 144/73 HR 82. Patient has not taken any of her medication today -Continue home Norvasc 10 mg QD - Coreg decreased from 25 mg BID to 12.5 mg BID  -  Lisinopril 40 mg QD  Hypothyroidism -Continue home Levothyroxine  Peripheral polyneuropathy: -Continue home Neurontin  FEN/GI: Carb modified diet, Protonix Prophylaxis: sub-q heparin  Disposition: pending Psych c/s   Subjective:  Lying in bed. VSS. Eating breakfast. Thoughts unchanged from the previous night.   Objective: Temp:  [98.4 F (36.9 C)-99.1 F (37.3 C)] 99.1 F (37.3 C) (12/22  0437) Pulse Rate:  [67-82] 67 (12/22 0437) Resp:  [18] 18 (12/22 0437) BP: (116-148)/(70-77) 116/70 mmHg (12/22 0437) SpO2:  [96 %-100 %] 96 % (12/22 0437) Weight:  [139 lb 8 oz (63.277 kg)] 139 lb 8 oz (63.277 kg) (12/21 2148) Physical Exam: General: awake, alert, well appearing, NAD,  HEENT: [place]/AT, MMM, left eye prosthesis  Cardiovascular: S1S2, RRR, no m/r/g Respiratory: CTAB, no increased WOb Extremities: WWP, graft in R thigh Neuro: blind, otherwise no focal deficits, follows commands Psych: affect appropriate, mood depressed, normal speech  Laboratory:  Recent Labs Lab 09/25/14 1628  WBC 5.5  HGB 9.8*  HCT 28.1*  PLT 188    Recent Labs Lab 09/25/14 1628  NA 135*  K 5.3  CL 92*  CO2 23  BUN 46*  CREATININE 10.65*  CALCIUM 9.7  PROT 8.6*  BILITOT 0.4  ALKPHOS 105  ALT 9  AST 10  GLUCOSE 369*     Imaging/Diagnostic Tests:   Myra RudeJeremy E Mildred Bollard, MD 09/26/2014, 9:08 AM PGY-2, [place] [facility] FPTS Intern pager: [phone], text pages welcome
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
## 2014-09-26 NOTE — Consult Note (Signed)
[facility] Renal Consultation Note    Indication for Consultation:  Management of ESRD/hemodialysis; anemia, hypertension/volume and secondary hyperparathyroidism PCP:  HPI: [name] is a 33 y.o. female with ESRD on HD since July 2014 with DN, HTN, blind, hx bilateral UE DVTs called dialysis yesterday staying she was going to sign off of HD.  The staff encouraged her to come it and talk to the SW. She was very up set and tearful.  She has lost her mother and grandmother her past year and having problems with personal relationships.   Her primary nephrologist was called and recommended she be transported to the ED for psych eval. She was taken to [facility] ED and subsequently transferred to [facility] for admission due to the need for HD.  She tells me she was recently seen by her PCP for "pelvic pain" which she describes a pain in right groin. She also said she was started on Neurontin for neuropathy that is helping but makes her feel a little unbalanced at times, which given her blindness is especially problematic. She has no SOB, CP, N, V, D. Her BP is low at dialysis at times, but reports no other problems with her dialysis treatments.  Past Medical History  Diagnosis Date  . 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 Hypothyroidism   . Hypertension   . Type I diabetes mellitus   . DVT (deep venous thrombosis)     "both arms" (11/11/2013)  . High cholesterol   . Diabetic retinopathy associated with type 1 diabetes mellitus   . Heart murmur     "slight" (11/11/2013)  . Graves' disease     "had thyroid radioactively treated" (11/11/2013)  . End-stage renal disease on hemodialysis     "TTS; [facility]" (11/11/2013)   Past Surgical History  Procedure Laterality Date  . Insertion of dialysis catheter N/A 04/01/2013    Procedure: INSERTION OF DIALYSIS CATHETER;  Surgeon: Pryor Ochoa, MD;  Location: [facility] OR;  Service: Vascular;  Laterality: N/A;  . Av fistula placement  Right 04/07/2013    Procedure: INSERTION OF ARTERIOVENOUS (AV) GORE-TEX GRAFT THIGH;  Surgeon: Chuck Hint, MD;  Location: [facility] OR;  Service: Vascular;  Laterality: Right;  . Cataract extraction w/ intraocular lens implant Right 2009  . Eye surgery Right 2009    "laser OR for diabetic retinopathy" (05/23/2013)  . Enucleation Left ~ 2010  . Revision of arteriovenous goretex graft Right 11/11/2013    Procedure: REVISION OF ARTERIOVENOUS GORETEX GRAFT - RIGHT THIGH;  Surgeon: Chuck Hint, MD;  Location: [facility] OR;  Service: Vascular;  Laterality: Right;  [name] Bilateral 04/04/2013    Procedure: VENOGRAM;  Surgeon: Chuck Hint, MD;  Location: [facility] C[facility] LAB;  Service: Cardiovascular;  Laterality: Bilateral;   Family History  Problem Relation Age of Onset  . Hypertension    . Cancer    . Thyroid disease    . Cancer Mother   . Asthma Father    Social History:  reports that she has never smoked. She has never used smokeless tobacco. She reports that she does not drink alcohol or use illicit drugs. No Known Allergies Prior to Admission medications   Medication Sig Start Date End Date Taking? Authorizing Provider  amLODipine (NORVASC) 10 MG tablet Take 1 tablet (10 mg total) by mouth at bedtime. Patient taking differently: Take 10 mg by mouth 2 (two) times daily.  07/25/14  Yes Ryan B  Grunz, MD  carvedilol (COREG) 25 MG tablet Take 1 tablet (25 mg total) by mouth 2 (two) times daily with a meal. Patient taking differently: Take 25 mg by mouth daily.  08/03/14  Yes Tyrone Nineyan B Grunz, MD  gabapentin (NEURONTIN) 300 MG capsule Take 1 capsule (300 mg total) by mouth 3Jarvis Newcomer (three) times daily. Patient taking differently: Take 300 mg by mouth daily.  09/18/14  Yes Tyrone Nineyan B Grunz, MD  insulin aspart (NOVOLOG) 100 UNIT/ML injection Inject 6 Units into the skin 3 (three) times daily before meals. 6 units + sliding scale   Yes Historical Provider, MD  Insulin Glargine (LANTUS SOLOSTAR) 100  UNIT/ML Solostar Pen Inject 14 Units into the skin 2 (two) times daily. Patient taking differently: Inject into the skin 2 (two) times daily. 10 units in am and 13 units in pm 08/03/14  Yes Tyrone Nineyan B Grunz, MD  levothyroxine (SYNTHROID, LEVOTHROID) 100 MCG tablet Take 1 tablet by mouth  daily 09/11/14  Yes Tyrone Nineyan B Grunz, MD  lisinopril (PRINIVIL,ZESTRIL) 20 MG tablet Take 20 mg by mouth 2 (two) times daily.   Yes Historical Provider, MD  aspirin 81 MG chewable tablet Chew 1 tablet (81 mg total) by mouth daily. 06/30/14   Temple N Rumley, DO  gabapentin (NEURONTIN) 300 MG capsule Take 1 capsule (300 mg total) by mouth 3 (three) times daily. 09/18/14   Tyrone Nineyan B Grunz, MD  isosorbide mononitrate (IMDUR) 30 MG 24 hr tablet Take 1 tablet (30 mg total) by mouth daily. 06/28/14   Glori LuisEric G Sonnenberg, MD  lisinopril (PRINIVIL,ZESTRIL) 40 MG tablet Take 1 tablet (40 mg total) by mouth daily. 08/03/14   Tyrone Nineyan B Grunz, MD  pantoprazole (PROTONIX) 40 MG tablet Take 1 tablet (40 mg total) by mouth daily. 06/30/14   Araceli Bouchealeigh N Rumley, DO   Current Facility-Administered Medications  Medication Dose Route Frequency Provider Last Rate Last Dose  . 0.9 %  sodium chloride infusion  100 mL Intravenous PRN Sheffield SliderMartha B. Athens Lebeau, PA-C      . 0.9 %  sodium chloride infusion  100 mL Intravenous PRN Sheffield SliderMartha B. Kainalu Heggs, PA-C      . alteplase (CATHFLO ACTIVASE) injection 2 mg  2 mg Intracatheter Once PRN Sheffield SliderMartha B. Alyzza Andringa, PA-C      . amLODipine (NORVASC) tablet 10 mg  10 mg Oral QHS Myra RudeJeremy E Schmitz, MD   10 mg at 09/25/14 2350  . carvedilol (COREG) tablet 12.5 mg  12.5 mg Oral BID WC Myra RudeJeremy E Schmitz, MD      . Darbepoetin Alfa (ARANESP) injection 25 mcg  25 mcg Intravenous Q Tue-HD Sheffield SliderMartha B. Kanya Potteiger, PA-C   25 mcg at 09/26/14 [phone]  . doxercalciferol (HECTOROL) injection 3 mcg  3 mcg Intravenous Q T,Th,Sa-HD Sheffield SliderMartha B. Randie Tallarico, PA-C   3 mcg at 09/26/14 [phone]  . feeding supplement (NEPRO CARB STEADY) liquid 237 mL  237 mL Oral PRN Sheffield SliderMartha B.  Inaki Vantine, PA-C      . Melene Muller[START ON 09/28/2014] ferric gluconate (NULECIT) 62.5 mg in sodium chloride 0.9 % 100 mL IVPB  62.5 mg Intravenous Q Thu-HD Sheffield SliderMartha B. Hoke Baer, PA-C      . gabapentin (NEURONTIN) capsule 300 mg  300 mg Oral Daily Myra RudeJeremy E Schmitz, MD      . heparin injection 1,000 Units  1,000 Units Dialysis PRN Sheffield SliderMartha B. Tarus Briski, PA-C      . heparin injection 1,300 Units  20 Units/kg Dialysis PRN Sheffield SliderMartha B. Angala Hilgers, PA-C      . heparin injection  5,000 Units  5,000 Units Subcutaneous 3 times per day Myra RudeJeremy E Schmitz, MD   5,000 Units at 09/26/14 [phone]  . insulin aspart (novoLOG) injection 0-5 Units  0-5 Units Subcutaneous QHS Raliegh IpAshly M Gottschalk, DO   5 Units at 09/25/14 2359  . insulin aspart (novoLOG) injection 0-9 Units  0-9 Units Subcutaneous TID WC Myra RudeJeremy E Schmitz, MD   0 Units at 09/26/14 [phone]  . insulin glargine (LANTUS) injection 10 Units  10 Units Subcutaneous BID Myra RudeJeremy E Schmitz, MD      . levothyroxine (SYNTHROID, LEVOTHROID) tablet 100 mcg  100 mcg Oral QAC breakfast Myra RudeJeremy E Schmitz, MD      . lidocaine (PF) (XYLOCAINE) 1 % injection 5 mL  5 mL Intradermal PRN Sheffield SliderMartha B. Sayge Salvato, PA-C      . lidocaine-prilocaine (EMLA) cream 1 application  1 application Topical PRN Sheffield SliderMartha B. Ford Peddie, PA-C      . lisinopril (PRINIVIL,ZESTRIL) tablet 20 mg  20 mg Oral BID Myra RudeJeremy E Schmitz, MD   20 mg at 09/25/14 2350  . multivitamin (RENA-VIT) tablet 1 tablet  1 tablet Oral QHS Sheffield SliderMartha B. Akari Crysler, PA-C      . pantoprazole (PROTONIX) EC tablet 40 mg  40 mg Oral Daily Ashly M Gottschalk, DO      . pentafluoroprop-tetrafluoroeth (GEBAUERS) aerosol 1 application  1 application Topical PRN Sheffield SliderMartha B. Keylin Ferryman, PA-C       Labs: Basic Metabolic Panel:  Recent Labs Lab 09/25/14 1628 09/26/14 0837  NA 135* 133*  K 5.3 4.5  CL 92* 94*  CO2 23 23  GLUCOSE 369* 134*  BUN 46* 58*  CREATININE 10.65* 12.38*  CALCIUM 9.7 8.6  PHOS  --  6.0*   Liver Function Tests:  Recent Labs Lab 09/25/14 1628  09/26/14 0837  AST 10  --   ALT 9  --   ALKPHOS 105  --   BILITOT 0.4  --   PROT 8.6*  --   ALBUMIN 4.1 3.5  CBC:  Recent Labs Lab 09/25/14 1628  WBC 5.5  HGB 9.8*  HCT 28.1*  MCV 89.8  PLT 188  CBG:  Recent Labs Lab 09/25/14 1949 09/25/14 2303 09/26/14 0718 09/26/14 0750  GLUCAP 389* 497* 66* 93  ROS: As per HPI otherwise negative.  Physical Exam: Filed Vitals:   09/26/14 1000 09/26/14 1030 09/26/14 1100 09/26/14 1130  BP: 129/80 152/70 158/81 158/83  Pulse: 68 78 73 75  Temp:      TempSrc:      Resp:  16 18 20   Height:      Weight:      SpO2:         General: Well developed, well nourished, calm Head: Normocephalic, atraumatic, sclera non-icteric, mucus membranes are moist, blind with left eye prosthesis Neck: Supple. JVD not elevated. Lungs: Clear bilaterally to auscultation without wheezes, rales, or rhonchi. Breathing is unlabored. Heart: RRR  Abdomen: Soft, non-tender, non-distended with normoactive bowel sounds. Lower extremities: without edema or ischemic changes, no open wounds  Neuro: Alert and oriented X 3. Moves all extremities spontaneously. Psych:  Responds to questions appropriately with a normal affect, fairly articulate about current situation Dialysis Access: right thigh graft + bruit  Dialysis Orders: Center: [facility] 180 400/A 1.5 2K 2.5 Ca right thigh AVGG Aranesp 25/week Hectorol 3 Recent labs Hgb 9 K 6.5 tsat 50% iPTH 330  Assessment/Plan: 1.  suicidal ideation/depression - overwhelmed by loss issues including need for HD and blindness/personal stressors; doesn't want medical tx -  needs ongoing outpt counseling; Psych/SW to see 2.  ESRD -  TTS per routine; K has been elevated at times at HD - she denies it is related to diet; could be high if BS is high 3.  Hypertension/volume  - at edw today - continue current meds 4.  Anemia  - Hgb 9.8 - continue weekly aranesp 5.  Metabolic bone disease -  Continue hectorol/ binders, 6.  Nutrition -  renal carb mod diet 7.  Type 1 DM - per primary 8.    Hypthyroidism on levothyroxine 9.    Neuropathy - on 300 neurontin - wouldn't give any more than this  Sheffield Slider, PA-C [facility] Beeper [phone] 09/26/2014, 11:47 AM

## 2014-09-27 ENCOUNTER — Observation Stay (HOSPITAL_COMMUNITY): Payer: 59

## 2014-09-27 ENCOUNTER — Encounter: Payer: Self-pay | Admitting: Family Medicine

## 2014-09-27 DIAGNOSIS — M25559 Pain in unspecified hip: Secondary | ICD-10-CM | POA: Insufficient documentation

## 2014-09-27 DIAGNOSIS — E1022 Type 1 diabetes mellitus with diabetic chronic kidney disease: Secondary | ICD-10-CM

## 2014-09-27 DIAGNOSIS — N189 Chronic kidney disease, unspecified: Secondary | ICD-10-CM

## 2014-09-27 DIAGNOSIS — M25551 Pain in right hip: Secondary | ICD-10-CM

## 2014-09-27 DIAGNOSIS — N186 End stage renal disease: Secondary | ICD-10-CM | POA: Diagnosis present

## 2014-09-27 LAB — RENAL FUNCTION PANEL
Albumin: 3.1 g/dL — ABNORMAL LOW (ref 3.5–5.2)
Anion gap: 9 (ref 5–15)
BUN: 22 mg/dL (ref 6–23)
CO2: 28 mmol/L (ref 19–32)
CREATININE: 6.56 mg/dL — AB (ref 0.50–1.10)
Calcium: 8.8 mg/dL (ref 8.4–10.5)
Chloride: 100 mEq/L (ref 96–112)
GFR, EST AFRICAN AMERICAN: 9 mL/min — AB (ref >90)
GFR, EST NON AFRICAN AMERICAN: 8 mL/min — AB (ref >90)
Glucose, Bld: 108 mg/dL — ABNORMAL HIGH (ref 70–99)
PHOSPHORUS: 5.4 mg/dL — AB (ref 2.3–4.6)
Potassium: 3.7 mmol/L (ref 3.5–5.1)
Sodium: 137 mmol/L (ref 135–145)

## 2014-09-27 LAB — GLUCOSE, CAPILLARY
GLUCOSE-CAPILLARY: 230 mg/dL — AB (ref 70–99)
Glucose-Capillary: 76 mg/dL (ref 70–99)
Glucose-Capillary: 300 mg/dL — ABNORMAL HIGH (ref 70–99)
Glucose-Capillary: 310 mg/dL — ABNORMAL HIGH (ref 70–99)

## 2014-09-27 LAB — CBC
HCT: 24 % — ABNORMAL LOW (ref 36.0–46.0)
Hemoglobin: 8.3 g/dL — ABNORMAL LOW (ref 12.0–15.0)
MCH: 30.2 pg (ref 26.0–34.0)
MCHC: 34.6 g/dL (ref 30.0–36.0)
MCV: 87.3 fL (ref 78.0–100.0)
PLATELETS: 166 10*3/uL (ref 150–400)
RBC: 2.75 MIL/uL — ABNORMAL LOW (ref 3.87–5.11)
RDW: 12.1 % (ref 11.5–15.5)
WBC: 3.8 10*3/uL — ABNORMAL LOW (ref 4.0–10.5)

## 2014-09-27 MED ORDER — RENA-VITE PO TABS: 1.0000 | ORAL_TABLET | Freq: Every day | ORAL | Status: AC

## 2014-09-27 MED ORDER — GABAPENTIN 300 MG PO CAPS: 300.0000 mg | ORAL_CAPSULE | Freq: Every day | ORAL | Status: AC

## 2014-09-27 NOTE — Progress Notes (Signed)
Patient ID: [name], female   DOB: 05/22/1981, 33 y.o.   MRN: [id number] Recommending Duplex U/S of her right groin area to r/o DVT due to prior history, this was ordered yesterday but not done due to getting dialysis. Patient signed off to Dr Gwendolyn GrantWalden this morning 09/27/14 at 8am to follow up on this prior to d/c from the hospital.

## 2014-09-27 NOTE — Progress Notes (Signed)
Family Medicine Teaching Service Daily Progress Note Intern Pager: [phone]  Patient name: [name] Runde Medical record number: [id number] Date of birth: 08/21/1981 Age: 33 y.o. Gender: female  Primary Care Provider: Hazeline JunkerGrunz, Ryan, MD Consultants: Renal, Psych  Code Status: Full   Pt Overview and Major Events to Date:  12/21: Admitted  12/22: Seen by psych  Assessment and Plan: [name] Halberg is a 33 y.o. female presenting with SI . PMH is significant for T1DM, Diabetic retinopathy causing blindness, ESRD, HTN, hypothyroidism, peripheral polyneuropathy, h/o DVT of UE.   Suicidal Ideation/Grief: Patient is feeling less overwhelmed today.  She is agreeable to inpatient psych treatment  -c/s to Psychiatry, recommend inpatient treatment.  [facility]  [facility] expect to have [facility] bed available today.  They can arrange HD for patient.  Coordinator to call with bed updates. -c/s to spiritual care, appreciate visits -c/s to SW -Bedside sitter -vitals per floor protocol  Pain in right groin: reports as pain there for over a year. Doesn't like taking pain medication. Patient receives HD in right anterior thigh and could be clot or symptoms related to HD.  -Venous duplex ordered.  Spoke to RN about this.  She will attempt to get things moving this morning.   - tylenol   ESRD on HD: receiving HD today. Renal informed.  -HD on T, Th, Sat -Continue Renavite, Aranesp   T1DM: 07/2014 A1c: 7.5. Brittle in nature and followed by Endocrinology.  -Monitor CBGs  -lantus decreased from home dose to 10 U BID  -sensitive SSI TID & HS  HTN: BP 124/78 HR 66.  -Continue home Norvasc 10 mg QD -Coreg decreased from 25 mg BID to 12.5 mg BID  -Lisinopril 40 mg QD  Hypothyroidism -Continue home Levothyroxine  Peripheral polyneuropathy: -Continue home Neurontin  FEN/GI: Carb modified diet, Protonix Prophylaxis: sub-q heparin  [facility] once [facility] duplex complete and they have a  bed available.  Subjective:  Lying in bed.  Reports to me that she is less overwhelmed than previous days.  She states that she feels like she should continue her HD and other medical treatments.  She is amendable to seeking inpatient psychiatric treatment.  She continues to endorse discomfort in her right groin area.  Denies any other concerns at this time.  Objective: Temp:  [97.3 F (36.3 C)-99.2 F (37.3 C)] 98.5 F (36.9 C) (12/23 0543) Pulse Rate:  [66-78] 66 (12/23 0543) Resp:  [16-20] 17 (12/23 0543) BP: (124-159)/(69-85) 124/78 mmHg (12/23 0543) SpO2:  [97 %-100 %] 100 % (12/23 0543) Weight:  [134 lb 14.7 oz (61.2 kg)] 134 lb 14.7 oz (61.2 kg) (12/22 0917) Physical Exam: General: lying in bed, alert, well appearing, NAD, sitter at bedside HEENT: [place]/AT, MMM, left eye prosthesis  Cardiovascular: S1S2, RRR, no [name]/r/g Respiratory: CTAB, no increased WOb Extremities: WWP, graft in R thigh Neuro: blind, otherwise no focal deficits, follows commands Psych: affect appropriate, mood depressed (but improving), normal speech  Laboratory:  Recent Labs Lab 09/25/14 1628 09/27/14 0500  WBC 5.5 3.8*  HGB 9.8* 8.3*  HCT 28.1* 24.0*  PLT 188 166    Recent Labs Lab 09/25/14 1628 09/26/14 0837 09/27/14 0500  NA 135* 133* 137  K 5.3 4.5 3.7  CL 92* 94* 100  CO2 23 23 28   BUN 46* 58* 22  CREATININE 10.65* 12.38* 6.56*  CALCIUM 9.7 8.6 8.8  PROT 8.6*  --   --   BILITOT 0.4  --   --  ALKPHOS 105  --   --   ALT 9  --   --   AST 10  --   --   GLUCOSE 369* 134* 108*     [address]ly Hulen SkainsM Izela Altier, DO 09/27/2014, 8:51 AM PGY-1, [facility] Family Medicine FPTS Intern pager: [phone], text pages welcome

## 2014-09-27 NOTE — Progress Notes (Signed)
Bilateral lower ext venous:  No evidence of DVT, superficial thrombosis, or Baker's Cyst.

## 2014-09-27 NOTE — Progress Notes (Signed)
[facility] Progress Note  Assessment/Plan: 1. suicidal ideation/depression - overwhelmed by loss issues including need for HD and blindness/personal stressors; doesn't want medical tx - needs ongoing outpt counseling; Psych/has seen; [facility] likely to have a bed available today - 2. ESRD - TTS per routine;next HD planned for Thursday; [facility]  3. Hypertension/volume - at edw today - continue current meds- kept even yesterday 4. Anemia - Hgb 9.8 - continue weekly aranesp- need to be sure orders [facility] 5. Metabolic bone disease - Continue hectorol/ binders, 6. Nutrition - renal carb mod diet 7. Type 1 DM - per primary   8.     Hypthyroidism on levothyroxine   9.     Neuropathy - on 300 neurontin - wouldn't give any more than this  Sheffield SliderMartha B Evelyna Folker, PA-C [facility] Beeper [phone] 09/27/2014,9:14 AM  LOS: 2 days   Subjective:   No new issues Objective Filed Vitals:   09/26/14 1322 09/26/14 1704 09/26/14 2135 09/27/14 0543  BP: 154/84 136/74 140/69 124/78  Pulse: 72 75 70 66  Temp: 98.4 F (36.9 C) 98.2 F (36.8 C) 99.2 F (37.3 C) 98.5 F (36.9 C)  TempSrc: Oral Oral Oral Oral  Resp: 20 19 18 17   Height:      Weight:      SpO2: 97% 100% 99% 100%   Physical Exam General: NAD, calm Heart:  RRR Lungs: no rales Abdomen: soft NT Extremities: no edema Dialysis Access: right thigh AVGG  Dialysis Orders: Center: [facility] 180 400/A 1.5 2K 2.5 Ca heparin 3000 EDW 61right thigh AVGG Aranesp 25/week Hectorol 3 Recent labs Hgb 9 K 6.5 tsat 50% iPTH 330  Additional Objective Labs: Basic Metabolic Panel:  Recent Labs Lab 09/25/14 1628 09/26/14 0837 09/27/14 0500  NA 135* 133* 137  K 5.3 4.5 3.7  CL 92* 94* 100  CO2 23 23 28   GLUCOSE 369* 134* 108*  BUN 46* 58* 22  CREATININE 10.65* 12.38* 6.56*  CALCIUM 9.7 8.6 8.8  PHOS  --  6.0* 5.4*   Liver Function  Tests:  Recent Labs Lab 09/25/14 1628 09/26/14 0837 09/27/14 0500  AST 10  --   --   ALT 9  --   --   ALKPHOS 105  --   --   BILITOT 0.4  --   --   PROT 8.6*  --   --   ALBUMIN 4.1 3.5 3.1*  CBC:  Recent Labs Lab 09/25/14 1628 09/27/14 0500  WBC 5.5 3.8*  HGB 9.8* 8.3*  HCT 28.1* 24.0*  MCV 89.8 87.3  PLT 188 166  CBG:  Recent Labs Lab 09/26/14 0750 09/26/14 1347 09/26/14 1623 09/26/14 2132 09/27/14 0753  GLUCAP 93 110* 114* 217* 76  Medications:   . amLODipine  10 mg Oral QHS  . carvedilol  20 mg Oral Daily  . darbepoetin (ARANESP) injection - DIALYSIS  25 mcg Intravenous Q Tue-HD  . doxercalciferol  3 mcg Intravenous Q T,Th,Sa-HD  . gabapentin  300 mg Oral Daily  . heparin  5,000 Units Subcutaneous 3 times per day  . insulin aspart  0-5 Units Subcutaneous QHS  . insulin aspart  0-9 Units Subcutaneous TID WC  . insulin glargine  10 Units Subcutaneous BID  . levothyroxine  100 mcg Oral QAC breakfast  . lisinopril  20 mg Oral BID  . multivitamin  1 tablet Oral QHS  . pantoprazole  40 mg Oral Daily

## 2014-09-27 NOTE — Progress Notes (Signed)
Mrs. [name] called to request further evaluation of hip pain. States pain is located on anterior right hip up into the RLQ. Pain with extremes of internal and external rotation of hip.  Notes clicking up hip with movement. States pain has gotten worse with onset of menstrual cycle. No abdominal pain noted on exam.  Will further evaluate hip pain with xray. Will consider abdominal ultrasound in future for possible ovarian cysts.  Dr. Caroleen Hammanumley PGY 1

## 2014-09-28 DIAGNOSIS — F329 Major depressive disorder, single episode, unspecified: Secondary | ICD-10-CM

## 2014-09-28 DIAGNOSIS — F332 Major depressive disorder, recurrent severe without psychotic features: Principal | ICD-10-CM

## 2014-09-28 DIAGNOSIS — R45851 Suicidal ideations: Secondary | ICD-10-CM

## 2014-09-28 LAB — RENAL FUNCTION PANEL
Albumin: 3.2 g/dL — ABNORMAL LOW (ref 3.5–5.2)
Anion gap: 12 (ref 5–15)
BUN: 42 mg/dL — ABNORMAL HIGH (ref 6–23)
CO2: 25 mmol/L (ref 19–32)
Calcium: 8.6 mg/dL (ref 8.4–10.5)
Chloride: 99 mEq/L (ref 96–112)
Creatinine, Ser: 10.24 mg/dL — ABNORMAL HIGH (ref 0.50–1.10)
GFR calc Af Amer: 5 mL/min — ABNORMAL LOW (ref >90)
GFR calc non Af Amer: 4 mL/min — ABNORMAL LOW (ref >90)
Glucose, Bld: 177 mg/dL — ABNORMAL HIGH (ref 70–99)
Phosphorus: 6.1 mg/dL — ABNORMAL HIGH (ref 2.3–4.6)
Potassium: 4.6 mmol/L (ref 3.5–5.1)
Sodium: 136 mmol/L (ref 135–145)

## 2014-09-28 LAB — CBC
HCT: 22.3 % — ABNORMAL LOW (ref 36.0–46.0)
Hemoglobin: 7.9 g/dL — ABNORMAL LOW (ref 12.0–15.0)
MCH: 30.4 pg (ref 26.0–34.0)
MCHC: 35.4 g/dL (ref 30.0–36.0)
MCV: 85.8 fL (ref 78.0–100.0)
Platelets: 172 10*3/uL (ref 150–400)
RBC: 2.6 MIL/uL — ABNORMAL LOW (ref 3.87–5.11)
RDW: 12 % (ref 11.5–15.5)
WBC: 3.2 10*3/uL — ABNORMAL LOW (ref 4.0–10.5)

## 2014-09-28 LAB — GLUCOSE, CAPILLARY
GLUCOSE-CAPILLARY: 198 mg/dL — AB (ref 70–99)
Glucose-Capillary: 126 mg/dL — ABNORMAL HIGH (ref 70–99)
Glucose-Capillary: 251 mg/dL — ABNORMAL HIGH (ref 70–99)

## 2014-09-28 MED ORDER — DOXERCALCIFEROL 4 MCG/2ML IV SOLN
INTRAVENOUS | Status: AC
  Filled 2014-09-28: qty 2

## 2014-09-28 MED ORDER — GABAPENTIN 300 MG PO CAPS
300.0000 mg | ORAL_CAPSULE | Freq: Every day | ORAL | Status: DC
  Administered 2014-09-28 – 2014-09-30 (×3): 300 mg via ORAL
  Filled 2014-09-28 (×6): qty 1

## 2014-09-28 NOTE — Progress Notes (Addendum)
Family Medicine Teaching Service Daily Progress Note Intern Pager: [phone]  Patient name: [name] Medical record number: [id number] Date of birth: 11/18/1980 Age: 33 y.o. Gender: female  Primary Care Provider: Hazeline JunkerGrunz, Ryan, MD Consultants: Renal, Psych  Code Status: Full   Pt Overview and Major Events to Date:  12/21: Admitted  12/22: Seen by psych  Assessment and Plan: [name] is a 33 y.o. female presenting with SI . PMH is significant for T1DM, Diabetic retinopathy causing blindness, ESRD, HTN, hypothyroidism, peripheral polyneuropathy, h/o DVT of UE.   Suicidal Ideation/Grief: Patient is stabel from yesterday.  She continues to be agreeable to inpatient psych treatment  -c/s to Psychiatry, recommend inpatient treatment.  [phone]).  They will contact SW for arrangements.  They can arrange HD for patient.  Called SW and left VM.   -c/s to spiritual care, appreciate visits -c/s to SW. SW to call with bed updates Almira Coaster(Gina? [phone]). -Bedside sitter -vitals per floor protocol  Pain in right groin:  Venous duplex negative for DVT. Right Hip xray negative for acute abnormalities.  Patient on menstrual cycle.  FROM on exam with normal strength. +Mild TTP to anterolateral aspect of hip - Recommend outpatient pelvic u/s if persistent pain  - tylenol   ESRD on HD: receiving HD today. Renal informed.  -HD on T, Th, Sat -Continue Renavite, Aranesp   T1DM: 07/2014 A1c: 7.5. Brittle in nature and followed by Endocrinology. AM CBG 126 -Monitor CBGs  -lantus decreased from home dose to 10 U BID  -sensitive SSI TID & HS  HTN: BP 125/73 HR 66.  -Continue home Norvasc 10 mg QD -Coreg decreased from 25 mg BID to 12.5 mg BID  -Lisinopril 40 mg QD  Hypothyroidism -Continue home Levothyroxine  Peripheral polyneuropathy: -Continue home Neurontin  FEN/GI: Carb modified diet, Protonix Prophylaxis: sub-q heparin  [facility]  [facility].  Subjective:  Patient reports that she continues to feel stable.  She continues to be less overwhelmed than when admitted to our service.  We went over the studies of her hip and she voices good understanding of plan if she continues to have pelvic pain after her menstrual cycle ends.  She does voice concern for ovarian etiology, as her mother died of ovarian cancer.  She is UTD on pap smears, with next screening in February.  Objective: Temp:  [98.1 F (36.7 C)-99.2 F (37.3 C)] 98.1 F (36.7 C) (12/24 0500) Pulse Rate:  [66-72] 66 (12/24 0500) Resp:  [18] 18 (12/24 0500) BP: (114-140)/(59-75) 125/73 mmHg (12/24 0500) SpO2:  [98 %-100 %] 100 % (12/24 0500) Weight:  [134 lb 11.2 oz (61.1 kg)] 134 lb 11.2 oz (61.1 kg) (12/23 2100) Physical Exam: General: lying in bed, alert, well appearing, NAD, sitter at bedside HEENT: [place]/AT, MMM, left eye prosthesis  Cardiovascular: S1S2, RRR, no m/r/g Respiratory: CTAB, no increased WOB Abd: flat, soft, NT, ND, +BS, no masses palpated  Extremities: WWP, graft in R thigh, FROM, 5/5 strength LE, +pain with IR of hip, +TTP to anterolateral aspect of hip, no edema or bony deformities appreciated Neuro: blind, otherwise no focal deficits, follows commands Psych: affect appropriate, mood depressed (but improving), normal speech  Laboratory:  Recent Labs Lab 09/25/14 1628 09/27/14 0500  WBC 5.5 3.8*  HGB 9.8* 8.3*  HCT 28.1* 24.0*  PLT 188 166    Recent Labs Lab 09/25/14 1628 09/26/14 0837 09/27/14 0500  NA 135* 133* 137  K  5.3 4.5 3.7  CL 92* 94* 100  CO2 23 23 28   BUN 46* 58* 22  CREATININE 10.65* 12.38* 6.56*  CALCIUM 9.7 8.6 8.8  PROT 8.6*  --   --   BILITOT 0.4  --   --   ALKPHOS 105  --   --   ALT 9  --   --   AST 10  --   --   GLUCOSE 369* 134* 108*     [address]Ashly Hulen SkainsM Gottschalk, DO 09/28/2014, 9:09 AM PGY-1, [facility] FPTS Intern pager: [phone], text pages welcome

## 2014-09-28 NOTE — Clinical Social Work Note (Signed)
CSW contacted Derrell Lollingoris Best [facility] regarding [facility] for patient. Clinical[facility] to [facility]. CSW talked with patient and clarified that although she feels very overwhelmed and tired with all that has gone on in her life this year, she does intend to continue dialysis treatments. This information [facility] with [facility]. CSW informed that Renata Capriceonrad, psychiatric nurse with [facility] will be assisting with placement for patient and may come to hospital to talk with Ms. Halter. CSW will be kept informed [facility] on progress's with placement.  Genelle BalVanessa Renaye Janicki, MSW, LCSW Licensed Clinical Social Worker Clinical Social Work Department [facility] [phone]

## 2014-09-28 NOTE — Progress Notes (Signed)
[facility] Progress Note  Assessment/Plan: 1. suicidal ideation/depression - overwhelmed by loss issues including need for HD and blindness/personal stressors; doesn't want medical tx - needs ongoing outpt counseling; Psych/has seen; [facility];  Had a long discussion with her regarding her situation; needs help with planning options for improving her living situation. 2. ESRD - TTS per routine;next HD planned for today (Thursday) - next HD will be Saturday  (or could be postponed until Sunday due to holiday schedule) 3. Hypertension/volume - at edw today - continue current meds- kept even yesterday 4. 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 Metabolic bone disease - Continue hectorol/ binders, 6. Nutrition - renal carb mod diet 7. Type 1 DM - per primary- BS labile  8. Hypthyroidism on levothyroxine  9. Neuropathy - on 300 neurontin - wouldn't give any more than this 10.  Right hip/groin pain - neg xray - neg dopplers  Sheffield SliderMartha B Tierra Thoma, PA-C [facility] Beeper [phone] 09/28/2014,11:40 AM  LOS: 3 days   Subjective:   Spirits getting better  Objective Filed Vitals:   09/28/14 1018 09/28/14 1030 09/28/14 1100 09/28/14 1130  BP: 130/82 130/73 142/83 134/77  Pulse: 69 69 67 70  Temp:      TempSrc:      Resp:      Height:      Weight:      SpO2:       Physical Exam General: NAD talkative, engaging Heart: RRR Lungs: no rales Abdomen: soft NT Extremities: no edema Dialysis Access: right thigh AVGG  Dialysis Orders: Center: [facility] TTS 180 400/A 4 hr 1.5 2K 2.5 Ca heparin 3000 EDW 61right thigh AVGG Aranesp 25/week Hectorol 3 Recent labs Hgb 9 K 6.5 tsat 50% iPTH  330  Additional Objective Labs: Basic Metabolic Panel:  Recent Labs Lab 09/26/14 0837 09/27/14 0500 09/28/14 1000  NA 133* 137 136  K 4.5 3.7 4.6  CL 94* 100 99  CO2 23 28 25   GLUCOSE 134* 108* 177*  BUN 58* 22 42*  CREATININE 12.38* 6.56* 10.24*  CALCIUM 8.6 8.8 8.6  PHOS 6.0* 5.4* 6.1*   Liver Function Tests:  Recent Labs Lab 09/25/14 1628 09/26/14 0837 09/27/14 0500 09/28/14 1000  AST 10  --   --   --   ALT 9  --   --   --   ALKPHOS 105  --   --   --   BILITOT 0.4  --   --   --   PROT 8.6*  --   --   --   ALBUMIN 4.1 3.5 3.1* 3.2*  CBC:  Recent Labs Lab 09/25/14 1628 09/27/14 0500 09/28/14 1000  WBC 5.5 3.8* 3.2*  HGB 9.8* 8.3* 7.9*  HCT 28.1* 24.0* 22.3*  MCV 89.8 87.3 85.8  PLT 188 166 172   Blood Culture    Component Value Date/Time   SDES URINE, RANDOM 06/26/2014 1530   SPECREQUEST NONE 06/26/2014 1530   CULT NO GROWTH [facility]formed at [facility] 06/26/2014 1530   REPTSTATUS 06/27/2014 FINAL 06/26/2014 1530  CBG:  Recent Labs Lab 09/27/14 0753 09/27/14 1200 09/27/14 1649 09/27/14 2127 09/28/14 0803  GLUCAP  76 300* 230* 310* 126*   Studies/Results: Dg Hip Complete Right  09/27/2014   CLINICAL DATA:  Right hip pain for 4 to 5 days.  No known injury.  EXAM: RIGHT HIP - COMPLETE 2+ VIEW  COMPARISON:  None.  FINDINGS: No fracture or dislocation. Right hip joint spaces appear preserved. No evidence of avascular necrosis.  Limited visualization of the pelvis and contralateral left hip is normal.  There is ingested radiopaque debris within the descending colon. Regional soft tissues appear otherwise normal.  IMPRESSION: No explanation for patient's right hip pain.   Electronically Signed   By: Simonne ComeJohn  Watts M.D.   On: 09/27/2014 16:28   Medications:   . amLODipine  10 mg Oral QHS  . carvedilol  20 mg Oral Daily  . darbepoetin (ARANESP) injection - DIALYSIS  25 mcg Intravenous Q Tue-HD  . doxercalciferol  3 mcg Intravenous Q T,Th,Sa-HD   . gabapentin  300 mg Oral Daily  . heparin  5,000 Units Subcutaneous 3 times per day  . insulin aspart  0-5 Units Subcutaneous QHS  . insulin aspart  0-9 Units Subcutaneous TID WC  . insulin glargine  10 Units Subcutaneous BID  . levothyroxine  100 mcg Oral QAC breakfast  . lisinopril  20 mg Oral BID  . multivitamin  1 tablet Oral QHS  . pantoprazole  40 mg Oral Daily

## 2014-09-28 NOTE — Consult Note (Signed)
[facility] Consult   Reason for Consult:  Follow Up Visit Referring Physician:  Dr.  Virgina Evener [name] is an 33 y.o. female. Total Time spent with patient: 30 minutes  Assessment: AXIS I:  Depression  NOS, consider Depression secondary to Medical Illness versus MDD  AXIS II:  Deferred AXIS III:   Past Medical History  Diagnosis Date  . 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 Hypothyroidism   . Hypertension   . Type I diabetes mellitus   . DVT (deep venous thrombosis)     "both arms" (11/11/2013)  . High cholesterol   . Diabetic retinopathy associated with type 1 diabetes mellitus   . Heart murmur     "slight" (11/11/2013)  . Graves' disease     "had thyroid radioactively treated" (11/11/2013)  . End-stage renal disease on hemodialysis     "TTS; [facility]" (11/11/2013)   AXIS IV:  mulitple losses over recent months, chronic medical illnesses AXIS V:  51-60 moderate symptoms  Plan:  Recommend psychiatric Inpatient admission when medically cleared.  Subjective:   Patient states that she has been "OK" . She does state that she had a fight with her aunt earlier today ( on phone) and was quite upset about it , although feeling better at this time. Objective: Please refer to initial psychiatric consultation by Ms. Waylan Boga, NP. I have reviewed chart , have met with patient, and have discussed with her RN. Patient is  33 year old  Female on Hemodyalisis for ESRD. She states she has been blind for several years. As per Nursing Staff, earlier today had an episode of agitation felt to be a panic attack. Patient states it was not panic but frustration regarding her family stressors. As per Nursing Staff she said earlier that she wanted to die.  Patient denies any active suicidal ideations at this time, but states " I just wish the earth would stop moving for a while, that everything would just stop". She describes multiple stressors and losses,  particularly over the last couple of years. These include starting hemodyalisis,  Loss of job , the  death of her mother, the  more recent death of her grandmother , and her anger and frustration with an aunt whom she feels is proceeding against her grandmother's wishes  As to how to proceed after she passed away. Patient minimizes neuro-vegetative symptoms of depression at this time. She is adamant about not wanting to take any psychiatric medications. She states she is aware of potential serious medication side effects from psychiatric medications/antidepressants.   Past Medical History  Diagnosis Date  . 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 Hypothyroidism   . Hypertension   . Type I diabetes mellitus   . DVT (deep venous thrombosis)     "both arms" (11/11/2013)  . High cholesterol   . Diabetic retinopathy associated with type 1 diabetes mellitus   . Heart murmur     "slight" (11/11/2013)  . Graves' disease     "had thyroid radioactively treated" (11/11/2013)  . End-stage renal disease on hemodialysis     "TTS; [facility]" (11/11/2013)      Results for orders placed or performed during the hospital encounter of 09/25/14 (from the past 72 hour(s))  CBG monitoring, ED     Status: Abnormal   Collection Time: 09/25/14  7:49 PM  Result Value Ref Range   Glucose-Capillary  389 (H) 70 - 99 mg/dL   Comment 1 Notify RN   Glucose, capillary     Status: Abnormal   Collection Time: 09/25/14 11:03 PM  Result Value Ref Range   Glucose-Capillary 497 (H) 70 - 99 mg/dL  MRSA PCR Screening     Status: None   Collection Time: 09/25/14 11:13 PM  Result Value Ref Range   MRSA by PCR NEGATIVE NEGATIVE    Comment:        The GeneXpert MRSA Assay (FDA approved for NASAL specimens only), is one component of a comprehensive MRSA colonization surveillance program. It is not intended to diagnose MRSA infection nor to guide or monitor treatment for MRSA infections.   Glucose,  capillary     Status: Abnormal   Collection Time: 09/26/14  7:18 AM  Result Value Ref Range   Glucose-Capillary 66 (L) 70 - 99 mg/dL  Glucose, capillary     Status: None   Collection Time: 09/26/14  7:50 AM  Result Value Ref Range   Glucose-Capillary 93 70 - 99 mg/dL  Renal function panel     Status: Abnormal   Collection Time: 09/26/14  8:37 AM  Result Value Ref Range   Sodium 133 (L) 135 - 145 mmol/L    Comment: Please note change in reference range.   Potassium 4.5 3.5 - 5.1 mmol/L    Comment: Please note change in reference range.   Chloride 94 (L) 96 - 112 mEq/L   CO2 23 19 - 32 mmol/L   Glucose, Bld 134 (H) 70 - 99 mg/dL   BUN 58 (H) 6 - 23 mg/dL   Creatinine, Ser 12.38 (H) 0.50 - 1.10 mg/dL   Calcium 8.6 8.4 - 10.5 mg/dL   Phosphorus 6.0 (H) 2.3 - 4.6 mg/dL   Albumin 3.5 3.5 - 5.2 g/dL   GFR calc non Af Amer 4 (L) >90 mL/min   GFR calc Af Amer 4 (L) >90 mL/min    Comment: (NOTE) The eGFR has been calculated using the CKD EPI equation. This calculation has not been validated in all clinical situations. eGFR's persistently <90 mL/min signify possible Chronic Kidney Disease.    Anion gap 16 (H) 5 - 15  Glucose, capillary     Status: Abnormal   Collection Time: 09/26/14  1:47 PM  Result Value Ref Range   Glucose-Capillary 110 (H) 70 - 99 mg/dL  Glucose, capillary     Status: Abnormal   Collection Time: 09/26/14  4:23 PM  Result Value Ref Range   Glucose-Capillary 114 (H) 70 - 99 mg/dL  Glucose, capillary     Status: Abnormal   Collection Time: 09/26/14  9:32 PM  Result Value Ref Range   Glucose-Capillary 217 (H) 70 - 99 mg/dL  Renal function panel     Status: Abnormal   Collection Time: 09/27/14  5:00 AM  Result Value Ref Range   Sodium 137 135 - 145 mmol/L    Comment: Please note change in reference range.   Potassium 3.7 3.5 - 5.1 mmol/L    Comment: Please note change in reference range. DELTA CHECK NOTED    Chloride 100 96 - 112 mEq/L   CO2 28 19 - 32  mmol/L   Glucose, Bld 108 (H) 70 - 99 mg/dL   BUN 22 6 - 23 mg/dL    Comment: DELTA CHECK NOTED   Creatinine, Ser 6.56 (H) 0.50 - 1.10 mg/dL    Comment: DELTA CHECK NOTED   Calcium 8.8 8.4 -  10.5 mg/dL   Phosphorus 5.4 (H) 2.3 - 4.6 mg/dL   Albumin 3.1 (L) 3.5 - 5.2 g/dL   GFR calc non Af Amer 8 (L) >90 mL/min   GFR calc Af Amer 9 (L) >90 mL/min    Comment: (NOTE) The eGFR has been calculated using the CKD EPI equation. This calculation has not been validated in all clinical situations. eGFR's persistently <90 mL/min signify possible Chronic Kidney Disease.    Anion gap 9 5 - 15  CBC     Status: Abnormal   Collection Time: 09/27/14  5:00 AM  Result Value Ref Range   WBC 3.8 (L) 4.0 - 10.5 K/uL   RBC 2.75 (L) 3.87 - 5.11 MIL/uL   Hemoglobin 8.3 (L) 12.0 - 15.0 g/dL   HCT 24.0 (L) 36.0 - 46.0 %   MCV 87.3 78.0 - 100.0 fL   MCH 30.2 26.0 - 34.0 pg   MCHC 34.6 30.0 - 36.0 g/dL   RDW 12.1 11.5 - 15.5 %   Platelets 166 150 - 400 K/uL  Glucose, capillary     Status: None   Collection Time: 09/27/14  7:53 AM  Result Value Ref Range   Glucose-Capillary 76 70 - 99 mg/dL  Glucose, capillary     Status: Abnormal   Collection Time: 09/27/14 12:00 PM  Result Value Ref Range   Glucose-Capillary 300 (H) 70 - 99 mg/dL  Glucose, capillary     Status: Abnormal   Collection Time: 09/27/14  4:49 PM  Result Value Ref Range   Glucose-Capillary 230 (H) 70 - 99 mg/dL  Glucose, capillary     Status: Abnormal   Collection Time: 09/27/14  9:27 PM  Result Value Ref Range   Glucose-Capillary 310 (H) 70 - 99 mg/dL  Glucose, capillary     Status: Abnormal   Collection Time: 09/28/14  8:03 AM  Result Value Ref Range   Glucose-Capillary 126 (H) 70 - 99 mg/dL  CBC     Status: Abnormal   Collection Time: 09/28/14 10:00 AM  Result Value Ref Range   WBC 3.2 (L) 4.0 - 10.5 K/uL   RBC 2.60 (L) 3.87 - 5.11 MIL/uL   Hemoglobin 7.9 (L) 12.0 - 15.0 g/dL   HCT 22.3 (L) 36.0 - 46.0 %   MCV 85.8 78.0 -  100.0 fL   MCH 30.4 26.0 - 34.0 pg   MCHC 35.4 30.0 - 36.0 g/dL   RDW 12.0 11.5 - 15.5 %   Platelets 172 150 - 400 K/uL  Renal function panel     Status: Abnormal   Collection Time: 09/28/14 10:00 AM  Result Value Ref Range   Sodium 136 135 - 145 mmol/L    Comment: Please note change in reference range.   Potassium 4.6 3.5 - 5.1 mmol/L    Comment: Please note change in reference range. DELTA CHECK NOTED    Chloride 99 96 - 112 mEq/L   CO2 25 19 - 32 mmol/L   Glucose, Bld 177 (H) 70 - 99 mg/dL   BUN 42 (H) 6 - 23 mg/dL    Comment: DELTA CHECK NOTED   Creatinine, Ser 10.24 (H) 0.50 - 1.10 mg/dL    Comment: DELTA CHECK NOTED   Calcium 8.6 8.4 - 10.5 mg/dL   Phosphorus 6.1 (H) 2.3 - 4.6 mg/dL   Albumin 3.2 (L) 3.5 - 5.2 g/dL   GFR calc non Af Amer 4 (L) >90 mL/min   GFR calc Af Amer 5 (L) >90 mL/min  Comment: (NOTE) The eGFR has been calculated using the CKD EPI equation. This calculation has not been validated in all clinical situations. eGFR's persistently <90 mL/min signify possible Chronic Kidney Disease.    Anion gap 12 5 - 15   Labs are reviewed and are pertinent for increased Phosphorus, hyperglycemia ( 177) ,elevated BUN, Creatinine.  Current Facility-Administered Medications  Medication Dose Route Frequency Provider Last Rate Last Dose  . amLODipine (NORVASC) tablet 10 mg  10 mg Oral QHS Rosemarie Ax, MD   10 mg at 09/27/14 2252  . carvedilol (COREG CR) 24 hr capsule 20 mg  20 mg Oral Daily Ukiah N Rumley, DO   20 mg at 09/28/14 1550  . Darbepoetin Alfa (ARANESP) injection 25 mcg  25 mcg Intravenous Q Tue-HD Myriam Jacobson, PA-C   25 mcg at 09/26/14 [phone]  . doxercalciferol (HECTOROL) 4 MCG/2ML injection           . doxercalciferol (HECTOROL) injection 3 mcg  3 mcg Intravenous Q T,Th,Sa-HD Myriam Jacobson, PA-C   3 mcg at 09/28/14 1422  . gabapentin (NEURONTIN) capsule 300 mg  300 mg Oral Daily Rosemarie Ax, MD   300 mg at 09/27/14 2252  . heparin  injection 5,000 Units  5,000 Units Subcutaneous 3 times per day Rosemarie Ax, MD   5,000 Units at 09/28/14 1552  . insulin aspart (novoLOG) injection 0-5 Units  0-5 Units Subcutaneous QHS Janora Norlander, DO   4 Units at 09/27/14 2253  . insulin aspart (novoLOG) injection 0-9 Units  0-9 Units Subcutaneous TID WC Rosemarie Ax, MD   1 Units at 09/28/14 0831  . insulin glargine (LANTUS) injection 10 Units  10 Units Subcutaneous BID Rosemarie Ax, MD   6 Units at 09/28/14 1550  . levothyroxine (SYNTHROID, LEVOTHROID) tablet 100 mcg  100 mcg Oral QAC breakfast Rosemarie Ax, MD   100 mcg at 09/28/14 2751  . lisinopril (PRINIVIL,ZESTRIL) tablet 20 mg  20 mg Oral BID Rosemarie Ax, MD   20 mg at 09/28/14 1549  . multivitamin (RENA-VIT) tablet 1 tablet  1 tablet Oral QHS Myriam Jacobson, PA-C   1 tablet at 09/27/14 2252  . pantoprazole (PROTONIX) EC tablet 40 mg  40 mg Oral Daily Ashly M Gottschalk, DO   40 mg at 09/26/14 1352    Psychiatric Specialty Exam:     Blood pressure 148/81, pulse 69, temperature 98.4 F (36.9 C), temperature source Oral, resp. rate 16, height _0  (1.6 m), weight 59.9 kg (132 lb 0.9 oz), SpO2 100 %.Body mass index is 23.4 kg/(m^2).  General Appearance: Fairly Groomed in hospital garb  Eye Contact::  * patient is blind  Speech:  Normal Rate  Volume:  Normal  Mood:  depressed  Affect:  Appropriate and reactive, smiles at times appropriately  Thought Process:  Linear  Orientation:  Other:  alert and attentive  Thought Content:  denies hallucinations, no delusions  Suicidal Thoughts:  Yes.  without intent/plan- as noted, denies plan or intention of hurting self, but has made statements of wanting to die to staff and describes a desire for " everything to stop"  Homicidal Thoughts:  No  Memory:  recent and remote grossly intact   Judgement:  Fair  Insight:  Fair  Psychomotor Activity:  Normal  Concentration:  Good  Recall:  Good  Fund of  Knowledge:Good  Language: Good  Akathisia:  Negative  Handed:  Right  AIMS (if indicated):  Assets:  Communication Skills Desire for Improvement Resilience  Sleep:      Musculoskeletal: Strength & Muscle Tone: within normal limits Gait & Station: gait not examined  Patient leans: N/A  Treatment Plan Summary: Agree with prior recommendation that patient be admitted to inpatient psychiatric unit once medically cleared. I discussed possible antidepressant medication with patient - she is not interested at this time.   Neita Garnet 09/28/2014 5:38 PM

## 2014-09-28 NOTE — Care Management Note (Signed)
CARE MANAGEMENT NOTE 09/28/2014  Patient:  [name],[name]   Account Number:  [id number]  Date Initiated:  09/28/2014  Documentation initiated by:  Renata Gambino  Subjective/Objective Assessment:   CM following for progression and d/c planning.     Action/Plan:   Pt working iwth CSW re d/c plans at this time.   Anticipated DC Date:  10/02/2014   [facility]         Choice offered to / List presented to:             Status of service:  In process, will continue to follow Medicare Important Message given?  YES (If response is "NO", the following Medicare IM given date fields will be blank) Date Medicare IM given:  09/28/2014 Medicare IM given by:  Valina Maes Date Additional Medicare IM given:   Additional Medicare IM given by:    Discharge Disposition:    Per UR Regulation:    If discussed at Long Length of Stay Meetings, dates discussed:    Comments:

## 2014-09-28 NOTE — Progress Notes (Signed)
Pt assessed on 09/25/14 and met inpatient criteria for psychiatric placement. Pt clinicals faxed to out to the following facilities:   [facility]  Pt to be reassessed face to face by Catalina Pizza, Psych NP. Lorriane Shire Crawford,CSW made aware.  Charlene Brooke, MSW  Social Worker [phone]

## 2014-09-29 LAB — GLUCOSE, CAPILLARY
GLUCOSE-CAPILLARY: 163 mg/dL — AB (ref 70–99)
Glucose-Capillary: 129 mg/dL — ABNORMAL HIGH (ref 70–99)
Glucose-Capillary: 182 mg/dL — ABNORMAL HIGH (ref 70–99)
Glucose-Capillary: 215 mg/dL — ABNORMAL HIGH (ref 70–99)

## 2014-09-29 MED ORDER — HYDROXYZINE HCL 10 MG PO TABS
10.0000 mg | ORAL_TABLET | Freq: Three times a day (TID) | ORAL | Status: DC | PRN
  Administered 2014-09-29: 10 mg via ORAL
  Filled 2014-09-29 (×2): qty 1

## 2014-09-29 NOTE — Progress Notes (Signed)
Family Medicine Teaching Service Daily Progress Note Intern Pager: [phone]  Patient name: [name] Medical record number: [id number] Date of birth: 02/25/1981 Age: 33 y.o. Gender: female  Primary Care Provider: Hazeline JunkerGrunz, Ryan, MD Consultants: Renal, Psych  Code Status: Full   Pt Overview and Major Events to Date:  12/21: Admitted  12/22: Seen by psych 12/24: Psych rec inpatient psych admission at facility with HD. Patient agreeable. CSW pending bed.           - Medically stable to be transferred (will continue HD) 12/25: Right hip pain resolved. Pending placement. CSW update - no beds today.  Assessment and Plan: Susan Deutscherameka M Banner is a 33 y.o. female presenting with SI . PMH is significant for T1DM, Diabetic retinopathy causing blindness, ESRD, HTN, hypothyroidism, peripheral polyneuropathy, h/o DVT of UE.   Suicidal Ideation/Grief - Stable - Currently denies suicidal or homicidal ideation at this time. Agreeable to transfer to Inpatient Psych with HD. - c/s to Psych, greatly appreciate assistance      - Last seen 12/24, continue to recommend admission to inpatient psych. Also recommend med therapy with anti-depressant (patient not interested).      - Continue bedside sitter      - Psych CSW coordinating bed placement. Clinicals faxed to facilities with HD      - 12/25: CSW states no beds available. Will not be DC'd today.  -c/s to spiritual care, appreciate visits  Pain in right groin - Resolved Currently without pain today. Recently on menstrual cycle, which stopped yesterday. Exam reassuring. Venous duplex negative for DVT. Right Hip xray negative for acute abnormalities. - Tylenol PRN - Monitor next menstrual cycle to see if pain returns - Consider outpatient pelvic u/s  ESRD on HD (T, Th, Sat): Last HD 12/24.  -c/s Renal, greatly appreciate assistance -Continue Renavite, Aranesp   T1DM: 07/2014 A1c: 7.5. Brittle in nature and followed by Endocrinology. -Monitor  CBGs(AM 129) -Lantus decreased from home dose to 10 U BID  -sensitive SSI TID & HS  HTN - Stable  -Continue home Norvasc 10 mg QD -Coreg-24 hr 20mg  daily -Lisinopril 40 mg QD  Hypothyroidism -Continue home Levothyroxine  Peripheral polyneuropathy: -Continue home Neurontin 300mg  qhs (max dose per renal)  FEN/GI: Carb modified diet, Protonix Prophylaxis: sub-q heparin  Disposition: Medically stable for discharge to admit Inpatient Psychiatry (pending bed availability).  - 12/25: per CSW, no beds available today, anticipate potential DC 12/26, however may take few days for facility with HD to become available.  Subjective:  Patient reports that she remains stable. Overall improved since admission. Denies any suicidal or homicidal ideation. Acknowledges psych visit yesterday, and reports agreement with plan to discharge to be admitted to inpatient psych facility once available, agrees to continue HD. Additionally, reports Right Hip/Groin pain has resolved today, stated that her menstrual cycle ended yesterday, interested in pursuing Pelvic US outpatient.  Objective: Temp:  [98.1 F (36.7 C)-99.1 F (37.3 C)] 98.1 F (36.7 C) (12/25 0543) Pulse Rate:  [67-74] 73 (12/25 0543) Resp:  [16-18] 18 (12/25 0543) BP: (117-172)/(72-83) 136/72 mmHg (12/25 0543) SpO2:  [100 %] 100 % (12/25 0543) Weight:  [132 lb 0.9 oz (59.9 kg)] 132 lb 0.9 oz (59.9 kg) (12/24 1422) Physical Exam: General: sitting up in bed, eating breakfast, well appearing, NAD, sitter at bedside HEENT: [place]/AT, MMM, left eye prosthesis  Cardiovascular: RRR, S1/S2, no murmurs Respiratory: CTAB, no increased WOB Abd: soft, NTND, +BS, no masses palpated  Extremities: WWP, graft in R  thigh, FROM, 5/5 strength bilateral LE, no significant tenderness to palpation of Right hip. Neuro: blind, otherwise no focal deficits, follows commands Psych: affect appropriate, mood appears stable today (improved), normal  speech  Laboratory:  Recent Labs Lab 09/25/14 1628 09/27/14 0500 09/28/14 1000  WBC 5.5 3.8* 3.2*  HGB 9.8* 8.3* 7.9*  HCT 28.1* 24.0* 22.3*  PLT 188 166 172    Recent Labs Lab 09/25/14 1628 09/26/14 0837 09/27/14 0500 09/28/14 1000  NA 135* 133* 137 136  K 5.3 4.5 3.7 4.6  CL 92* 94* 100 99  CO2 23 23 28 25   BUN 46* 58* 22 42*  CREATININE 10.65* 12.38* 6.56* 10.24*  CALCIUM 9.7 8.6 8.8 8.6  PROT 8.6*  --   --   --   BILITOT 0.4  --   --   --   ALKPHOS 105  --   --   --   ALT 9  --   --   --   AST 10  --   --   --   GLUCOSE 369* 134* 108* 177*     Saralyn PilarAlexander Karamalegos, DO 09/29/2014, 8:45 AM PGY-2, [facility] FPTS Intern pager: [phone], text pages welcome

## 2014-09-29 NOTE — Progress Notes (Signed)
[facility] ROUNDING NOTE   Subjective:   Interval History:  Stable this morning appropriate and wished me a merry christmas   Objective:  Vital signs in last 24 hours:  Temp:  [98.1 F (36.7 C)-99.1 F (37.3 C)] 98.5 F (36.9 C) (12/25 1010) Pulse Rate:  [66-74] 66 (12/25 1010) Resp:  [16-18] 17 (12/25 1010) BP: (128-172)/(72-83) 128/78 mmHg (12/25 1010) SpO2:  [100 %] 100 % (12/25 1010) Weight:  [59.9 kg (132 lb 0.9 oz)] 59.9 kg (132 lb 0.9 oz) (12/24 1422)  Weight change: -1.199 kg (-2 lb 10.3 oz) Filed Weights   09/26/14 0917 09/27/14 2100 09/28/14 1422  Weight: 61.2 kg (134 lb 14.7 oz) 61.1 kg (134 lb 11.2 oz) 59.9 kg (132 lb 0.9 oz)    Intake/Output: I/O last 3 completed shifts: In: 360 [P.O.:360] Out: 1000 [Other:1000]   Intake/Output this shift:  Total I/O In: 240 [P.O.:240] Out: -   CVS- RRR RS- CTA ABD- BS present soft non-distended EXT- no edema   Basic Metabolic Panel:  Recent Labs Lab 09/25/14 1628 09/26/14 0837 09/27/14 0500 09/28/14 1000  NA 135* 133* 137 136  K 5.3 4.5 3.7 4.6  CL 92* 94* 100 99  CO2 23 23 28 25   GLUCOSE 369* 134* 108* 177*  BUN 46* 58* 22 42*  CREATININE 10.65* 12.38* 6.56* 10.24*  CALCIUM 9.7 8.6 8.8 8.6  PHOS  --  6.0* 5.4* 6.1*    Liver Function Tests:  Recent Labs Lab 09/25/14 1628 09/26/14 0837 09/27/14 0500 09/28/14 1000  AST 10  --   --   --   ALT 9  --   --   --   ALKPHOS 105  --   --   --   BILITOT 0.4  --   --   --   PROT 8.6*  --   --   --   ALBUMIN 4.1 3.5 3.1* 3.2*   No results for input(s): LIPASE, AMYLASE in the last 168 hours. No results for input(s): AMMONIA in the last 168 hours.  CBC:  Recent Labs Lab 09/25/14 1628 09/27/14 0500 09/28/14 1000  WBC 5.5 3.8* 3.2*  HGB 9.8* 8.3* 7.9*  HCT 28.1* 24.0* 22.3*  MCV 89.8 87.3 85.8  PLT 188 166 172    Cardiac Enzymes: No results for input(s): CKTOTAL, CKMB, CKMBINDEX, TROPONINI in the last 168 hours.  BNP: Invalid  input(s): POCBNP  CBG:  Recent Labs Lab 09/28/14 0803 09/28/14 1804 09/28/14 2105 09/29/14 0736 09/29/14 1200  GLUCAP 126* 198* 251* 129* 163*    Microbiology: Results for orders placed or performed during the hospital encounter of 09/25/14  MRSA PCR Screening     Status: None   Collection Time: 09/25/14 11:13 PM  Result Value Ref Range Status   MRSA by PCR NEGATIVE NEGATIVE Final    Comment:        The GeneXpert MRSA Assay (FDA approved for NASAL specimens only), is one component of a comprehensive MRSA colonization surveillance program. It is not intended to diagnose MRSA infection nor to guide or monitor treatment for MRSA infections.     Coagulation Studies: No results for input(s): LABPROT, INR in the last 72 hours.  Urinalysis: No results for input(s): COLORURINE, LABSPEC, PHURINE, GLUCOSEU, HGBUR, BILIRUBINUR, KETONESUR, PROTEINUR, UROBILINOGEN, NITRITE, LEUKOCYTESUR in the last 72 hours.  Invalid input(s): APPERANCEUR    Imaging: Dg Hip Complete Right  09/27/2014   CLINICAL DATA:  Right hip pain for 4 to 5 days.  No  known injury.  EXAM: RIGHT HIP - COMPLETE 2+ VIEW  COMPARISON:  None.  FINDINGS: No fracture or dislocation. Right hip joint spaces appear preserved. No evidence of avascular necrosis.  Limited visualization of the pelvis and contralateral left hip is normal.  There is ingested radiopaque debris within the descending colon. Regional soft tissues appear otherwise normal.  IMPRESSION: No explanation for patient's right hip pain.   Electronically Signed   By: Simonne ComeJohn  Watts M.D.   On: 09/27/2014 16:28     Medications:     . amLODipine  10 mg Oral QHS  . carvedilol  20 mg Oral Daily  . darbepoetin (ARANESP) injection - DIALYSIS  25 mcg Intravenous Q Tue-HD  . doxercalciferol  3 mcg Intravenous Q T,Th,Sa-HD  . gabapentin  300 mg Oral QHS  . heparin  5,000 Units Subcutaneous 3 times per day  . insulin aspart  0-5 Units Subcutaneous QHS  . insulin  aspart  0-9 Units Subcutaneous TID WC  . insulin glargine  10 Units Subcutaneous BID  . levothyroxine  100 mcg Oral QAC breakfast  . lisinopril  20 mg Oral BID  . multivitamin  1 tablet Oral QHS  . pantoprazole  40 mg Oral Daily     Assessment/ Plan:  1. suicidal ideation/depression - overwhelmed by loss issues including need for HD and blindness/personal stressors; doesn't want medical tx - needs ongoing outpt counseling; Psych/has seen; [facility] likely to have a bed available today - 2. ESRD - TTS per routine;next HD; [facility]  3. Hypertension/volume - at edw today - continue current meds- kept even yesterday 4. Anemia - Hgb drop to 7.9  - continue weekly aranesp- [facility] be [facility] 5. Metabolic bone disease - Continue hectorol/ binders, 6. Nutrition - renal carb mod diet 7. Type 1 DM - per primary  8. Hypthyroidism on levothyroxine  9.Neuropathy - on 300 neurontin - wouldn't give any more than this   LOS: 4 Giovanne Nickolson W @TODAY @12 :42 PM

## 2014-09-29 NOTE — Discharge Instructions (Signed)
[facility] and continued hemodialysis treatment.  Resolved Right Hip Pain (ended at with resolution of menstrual cycle), possibly related to menstrual cycle. We recommend continuing to monitor future menstrual cycles to see if pain returns with next cycle. Otherwise, please discuss this further with primary doctor and may benefit from Outpatient Pelvic Ultrasound.  Please call Family Medicine Clinic [phone] to schedule "hospital follow-up" appointment with PCP Dr. Jarvis NewcomerGrunz after you are discharged from the hospital.

## 2014-09-29 NOTE — Clinical Social Work Note (Signed)
[facility] about status of inpatient psychiatric bed availability for patient.  Patient's need for hemodialysis narrows the options for facilities able to accept her, as noted by Derrell Lollingoris Best, MSW.  Currently awaiting assessment [facility]rad[facility] in order to determine if patient is a good candidate for admission [facility].

## 2014-09-30 LAB — GLUCOSE, CAPILLARY
GLUCOSE-CAPILLARY: 386 mg/dL — AB (ref 70–99)
GLUCOSE-CAPILLARY: 86 mg/dL (ref 70–99)
Glucose-Capillary: 77 mg/dL (ref 70–99)

## 2014-09-30 LAB — RENAL FUNCTION PANEL
Albumin: 3.2 g/dL — ABNORMAL LOW (ref 3.5–5.2)
Anion gap: 11 (ref 5–15)
BUN: 34 mg/dL — ABNORMAL HIGH (ref 6–23)
CO2: 25 mmol/L (ref 19–32)
Calcium: 8.6 mg/dL (ref 8.4–10.5)
Chloride: 98 mEq/L (ref 96–112)
Creatinine, Ser: 9.26 mg/dL — ABNORMAL HIGH (ref 0.50–1.10)
GFR calc Af Amer: 6 mL/min — ABNORMAL LOW (ref >90)
GFR calc non Af Amer: 5 mL/min — ABNORMAL LOW (ref >90)
Glucose, Bld: 95 mg/dL (ref 70–99)
Phosphorus: 6.2 mg/dL — ABNORMAL HIGH (ref 2.3–4.6)
Potassium: 3.7 mmol/L (ref 3.5–5.1)
Sodium: 134 mmol/L — ABNORMAL LOW (ref 135–145)

## 2014-09-30 LAB — CBC
HCT: 20.8 % — ABNORMAL LOW (ref 36.0–46.0)
Hemoglobin: 7.5 g/dL — ABNORMAL LOW (ref 12.0–15.0)
MCH: 30.9 pg (ref 26.0–34.0)
MCHC: 36.1 g/dL — ABNORMAL HIGH (ref 30.0–36.0)
MCV: 85.6 fL (ref 78.0–100.0)
Platelets: 147 10*3/uL — ABNORMAL LOW (ref 150–400)
RBC: 2.43 MIL/uL — ABNORMAL LOW (ref 3.87–5.11)
RDW: 12 % (ref 11.5–15.5)
WBC: 3.9 10*3/uL — ABNORMAL LOW (ref 4.0–10.5)

## 2014-09-30 LAB — PREPARE RBC (CROSSMATCH)

## 2014-09-30 MED ORDER — SODIUM CHLORIDE 0.9 % IV SOLN: 100.0000 mL | INTRAVENOUS | Status: DC | PRN

## 2014-09-30 MED ORDER — HEPARIN SODIUM (PORCINE) 1000 UNIT/ML DIALYSIS: 1000.0000 [IU] | INTRAMUSCULAR | Status: DC | PRN

## 2014-09-30 MED ORDER — DARBEPOETIN ALFA 100 MCG/0.5ML IJ SOSY
100.0000 ug | PREFILLED_SYRINGE | INTRAMUSCULAR | Status: DC
  Administered 2014-09-30: 100 ug via INTRAVENOUS
  Filled 2014-09-30: qty 0.5

## 2014-09-30 MED ORDER — LIDOCAINE HCL (PF) 1 % IJ SOLN: 5.0000 mL | INTRAMUSCULAR | Status: DC | PRN

## 2014-09-30 MED ORDER — HEPARIN SODIUM (PORCINE) 1000 UNIT/ML DIALYSIS: 20.0000 [IU]/kg | INTRAMUSCULAR | Status: DC | PRN

## 2014-09-30 MED ORDER — DOXERCALCIFEROL 4 MCG/2ML IV SOLN
INTRAVENOUS | Status: AC
  Filled 2014-09-30: qty 2

## 2014-09-30 MED ORDER — SODIUM CHLORIDE 0.9 % IV SOLN: Freq: Once | INTRAVENOUS | Status: DC

## 2014-09-30 MED ORDER — LIDOCAINE-PRILOCAINE 2.5-2.5 % EX CREA: 1.0000 "application " | TOPICAL_CREAM | CUTANEOUS | Status: DC | PRN

## 2014-09-30 MED ORDER — PENTAFLUOROPROP-TETRAFLUOROETH EX AERO: 1.0000 "application " | INHALATION_SPRAY | CUTANEOUS | Status: DC | PRN

## 2014-09-30 MED ORDER — DARBEPOETIN ALFA 100 MCG/0.5ML IJ SOSY
PREFILLED_SYRINGE | INTRAMUSCULAR | Status: AC
  Filled 2014-09-30: qty 0.5

## 2014-09-30 MED ORDER — ALTEPLASE 2 MG IJ SOLR
2.0000 mg | Freq: Once | INTRAMUSCULAR | Status: DC | PRN
  Filled 2014-09-30: qty 2

## 2014-09-30 MED ORDER — NEPRO/CARBSTEADY PO LIQD: 237.0000 mL | ORAL | Status: DC | PRN

## 2014-09-30 MED ORDER — ALTEPLASE 2 MG IJ SOLR: 2.0000 mg | Freq: Once | INTRAMUSCULAR | Status: DC | PRN

## 2014-09-30 MED ORDER — DARBEPOETIN ALFA 25 MCG/0.42ML IJ SOSY
25.0000 ug | PREFILLED_SYRINGE | INTRAMUSCULAR | Status: DC
Start: 2014-09-30 — End: 2014-09-30

## 2014-09-30 NOTE — Progress Notes (Signed)
Family Medicine Teaching Service Daily Progress Note Intern Pager: [phone]  Patient name: [name] Medical record number: [id number] Date of birth: 06/06/1981 Age: 33 y.o. Gender: female  Primary Care Provider: Hazeline JunkerGrunz, Ryan, MD Consultants: Renal, Psych  Code Status: Full   Pt Overview and Major Events to Date:  12/21: Admitted  12/22: Seen by psych 12/24: Psych rec inpatient psych admission at facility with HD. Patient agreeable. CSW pending bed.           - Medically stable to be transferred (will continue HD) 12/25: Right hip pain resolved. Pending placement. CSW update - no beds today. 12/26 - CSW update pending  Assessment and Plan: [name] is a 33 y.o. female presenting with SI . PMH is significant for T1DM, Diabetic retinopathy causing blindness, ESRD, HTN, hypothyroidism, peripheral polyneuropathy, h/o DVT of UE.   Suicidal Ideation/Grief - Stable - Currently denies suicidal or homicidal ideation  - c/s to Psych, greatly appreciate assistance      - Last seen 12/24, continue to recommend admission to inpatient psych. Also recommend med therapy with anti-depressant (patient not interested).      - Continue bedside sitter      - Psych CSW coordinating bed placement. Clinicals faxed to facilities with HD      - 12/25: CSW states no beds available, still pending for today - c/s to spiritual care, appreciate visits  Pain in right groin - Resolved - Venous duplex negative for DVT. Right Hip xray negative for acute abnormalities. - Tylenol PRN - Monitor next menstrual cycle to see if pain returns - Consider outpatient pelvic u/s  ESRD on HD (T, Th, Sat): - Currently in HD.  - c/s Renal, greatly appreciate assistance - Continue Renavite, Aranesp   T1DM: 07/2014 A1c: 7.5. Brittle in nature and followed by Endocrinology. - Monitor [phone] - Lantus decreased from home dose to 10 U BID  - sensitive SSI TID & HS  Anemia, normocytic - Likely anemia of  chronic renal disease - Continue aranesp per nephro  - Transfusion threshold 7  HTN - Stable  - Continue home Norvasc 10 mg QD - Coreg-24 hr 20mg  daily - Lisinopril 40 mg QD  Hypothyroidism - Continue home Levothyroxine  Peripheral polyneuropathy: - Continue home Neurontin 300mg  qhs (max dose per renal)  FEN/GI: Carb modified diet, Protonix Prophylaxis: sub-q heparin  Disposition: Medically stable for discharge to admit Inpatient Psychiatry (pending bed availability).  - 12/25: per CSW, no beds available today, anticipate potential DC 12/26, however may take few days for facility with HD to become available. - 12/26 - awaiting update from CSW  Subjective:  No new complaints, Wants to rest saying that she didn't sleep well overnight. Denies SI.   Objective: Temp:  [97.5 F (36.4 C)-99 F (37.2 C)] 97.5 F (36.4 C) (12/26 0705) Pulse Rate:  [61-66] 65 (12/26 0830) Resp:  [16-17] 16 (12/26 0705) BP: (111-151)/(65-83) 115/65 mmHg (12/26 0830) SpO2:  [100 %] 100 % (12/26 0705) Weight:  [133 lb (60.328 kg)-133 lb 9.6 oz (60.6 kg)] 133 lb 9.6 oz (60.6 kg) (12/26 0705) Physical Exam: General: sleeping in bed at HD, Tired but interactive, not wanting to talk much, sitter at bedside HEENT: [place]/AT, MMM, left eye prosthesis  Cardiovascular: RRR, S1/S2, no murmurs Respiratory: CTAB, no increased WOB Abd: soft, NTND, +BS, no masses palpated  Extremities: WWP, no edema Neuro: blind, otherwise no gross deficits, follows commands Psych: affect appropriate, mood appears stable today (improved), normal speech  Laboratory:  Recent Labs Lab 09/27/14 0500 09/28/14 1000 09/30/14 0642  WBC 3.8* 3.2* 3.9*  HGB 8.3* 7.9* 7.5*  HCT 24.0* 22.3* 20.8*  PLT 166 172 147*    Recent Labs Lab 09/25/14 1628  09/27/14 0500 09/28/14 1000 09/30/14 0642  NA 135*  < > 137 136 134*  K 5.3  < > 3.7 4.6 3.7  CL 92*  < > 100 99 98  CO2 23  < > 28 25 25   BUN 46*  < > 22 42* 34*  CREATININE  10.65*  < > 6.56* 10.24* 9.26*  CALCIUM 9.7  < > 8.8 8.6 8.6  PROT 8.6*  --   --   --   --   BILITOT 0.4  --   --   --   --   ALKPHOS 105  --   --   --   --   ALT 9  --   --   --   --   AST 10  --   --   --   --   GLUCOSE 369*  < > 108* 177* 95  < > = values in this interval not displayed.   Elenora GammaSamuel L Bradshaw, MD 09/30/2014, 8:57 AM PGY-3,  Family Medicine FPTS Intern pager: [phone], text pages welcome

## 2014-09-30 NOTE — Clinical Social Work Note (Signed)
[facility] about status of inpatient psychiatric bed availability for patient.[facility] and spoke with [name], Susan Minerva Areola(Eric). Patient to be assessed in person, by Renata Capriceonrad, [facility] in order to determine if patient is a good candidate for admission [facility] on this date. CSW to continue to follow.  [name], LCSWA Weekend Clinical Social Worker [phone]

## 2014-09-30 NOTE — Progress Notes (Signed)
Patient ID: [name], female   DOB: 04/07/1981, 33 y.o.   MRN: [id number]  [facility] Progress Note   Assessment/ Plan:   1. Suicidal ideation/depression - overwhelmed by medical problems/blindness/personal stressors; Refuses medical tx. [facility]. 2. ESRD - TTS per routine; next HD today; [facility] HD [facility]   3. Hypertension/volume - at EDW- no indications to challenge UF goal 4. Anemia - Hgb drop to 7.5 - continue weekly aranesp- transfuse 2 units PRBCs today 5. Metabolic bone disease - Continue hectorol/ binders, 6. Nutrition - renal carb mod diet 7. Type 1 DM - per primary  8. Hypthyroidism on levothyroxine  9.Neuropathy - on 300 neurontin - at maximum dose  Subjective:   Reports to be unhappy and still bothered by her multiple problems   Objective:   BP 100/57 mmHg  Pulse 64  Temp(Src) 97.5 F (36.4 C) (Oral)  Resp 16  Ht 5\' 3"  (1.6 m)  Wt 60.6 kg (133 lb 9.6 oz)  BMI 23.67 kg/m2  SpO2 100%  Physical Exam: WUJ:WJXBJYNWGNFGen:Comfrotably sleeping at HD AOZ:HYQMVCVS:Pulse RRR, normal s1 and s2 Resp:CTA bilaterally, no rales/rhonchi HQI:ONGEAbd:soft, flat, NT, BS normal Ext:No LE edema  Labs: BMET  Recent Labs Lab 09/25/14 1628 09/26/14 0837 09/27/14 0500 09/28/14 1000 09/30/14 0642  NA 135* 133* 137 136 134*  K 5.3 4.5 3.7 4.6 3.7  CL 92* 94* 100 99 98  CO2 23 23 28 25 25   GLUCOSE 369* 134* 108* 177* 95  BUN 46* 58* 22 42* 34*  CREATININE 10.65* 12.38* 6.56* 10.24* 9.26*  CALCIUM 9.7 8.6 8.8 8.6 8.6  PHOS  --  6.0* 5.4* 6.1* 6.2*   CBC  Recent Labs Lab 09/25/14 1628 09/27/14 0500 09/28/14 1000 09/30/14 0642  WBC 5.5 3.8* 3.2* 3.9*  HGB 9.8* 8.3* 7.9* 7.5*  HCT 28.1* 24.0* 22.3* 20.8*  MCV 89.8 87.3 85.8 85.6  PLT 188 166 172 147*   Medications:    . sodium chloride   Intravenous Once  . amLODipine  10 mg Oral QHS  . carvedilol  20 mg Oral Daily  . Darbepoetin Alfa      . darbepoetin  (ARANESP) injection - DIALYSIS  100 mcg Intravenous Q Sat-HD  . doxercalciferol  3 mcg Intravenous Q T,Th,Sa-HD  . gabapentin  300 mg Oral QHS  . heparin  5,000 Units Subcutaneous 3 times per day  . insulin aspart  0-5 Units Subcutaneous QHS  . insulin aspart  0-9 Units Subcutaneous TID WC  . insulin glargine  10 Units Subcutaneous BID  . levothyroxine  100 mcg Oral QAC breakfast  . lisinopril  20 mg Oral BID  . multivitamin  1 tablet Oral QHS  . pantoprazole  40 mg Oral Daily   Zetta BillsJay Feliberto Stockley, MD 09/30/2014, 9:39 AM

## 2014-09-30 NOTE — Clinical Social Work Note (Signed)
CSW [facility] with [facility]. [facility] and clinicals should be sent for review. CS[facility] to [facility] and continue to follow.  Ilir Mahrt Patrick-Jefferson, LCSWA Weekend Clinical Social Worker [phone]

## 2014-09-30 NOTE — Procedures (Signed)
Patient seen on Hemodialysis. QB 350, UF goal 300 Treatment adjusted as needed.  Zetta BillsJay Zadrian Mccauley MD [facility]. Office # [phone] Pager # [phone] 9:38 AM

## 2014-09-30 NOTE — Progress Notes (Signed)
Patient stated she was leaving AMA at 6 pm if the doctor does not come and see her before then.  MD notified.

## 2014-09-30 NOTE — Progress Notes (Signed)
Called to patient's bedside by RN du to patient wanting to leave AMA.   She states that she is frustrated because she is stable and waiting for a bed but doesn't feel like anything is happening in the meantime. She is worried about the cost of this hospitalization.   She denies SI/HI, and is A&OX4. She stays with a friend and would go home to stay on her couch. She states that she will go to HD on Tuesday as scheduled.   I explained it would be AMA if she left but I find no grounds to IVC her. The team and I will attempt to contact Psych to get them to re-evaluate her tomorrow to see if they still agree she should be IP psych, it also seems that we are very close to transferring [facility].   She understands and will stay another night.

## 2014-09-30 NOTE — Clinical Social Work Note (Signed)
CSW spoke with MD regarding patient wanting to leave AMA. [facility] and has agreed to remain in hospital. CSW to follow tomorrow.  Yarieliz Wasser Patrick-Jefferson, LCSWA Weekend Clinical Social Worker [phone]

## 2014-10-01 LAB — CBC
HCT: 29.3 % — ABNORMAL LOW (ref 36.0–46.0)
HEMOGLOBIN: 10.5 g/dL — AB (ref 12.0–15.0)
MCH: 31.7 pg (ref 26.0–34.0)
MCHC: 35.8 g/dL (ref 30.0–36.0)
MCV: 88.5 fL (ref 78.0–100.0)
PLATELETS: 151 10*3/uL (ref 150–400)
RBC: 3.31 MIL/uL — ABNORMAL LOW (ref 3.87–5.11)
RDW: 12.7 % (ref 11.5–15.5)
WBC: 4.8 10*3/uL (ref 4.0–10.5)

## 2014-10-01 LAB — TYPE AND SCREEN
ABO/RH(D): B POS
Antibody Screen: NEGATIVE
Unit division: 0
Unit division: 0

## 2014-10-01 LAB — BASIC METABOLIC PANEL
ANION GAP: 8 (ref 5–15)
BUN: 22 mg/dL (ref 6–23)
CALCIUM: 8.9 mg/dL (ref 8.4–10.5)
CO2: 29 mmol/L (ref 19–32)
Chloride: 101 mEq/L (ref 96–112)
Creatinine, Ser: 5.91 mg/dL — ABNORMAL HIGH (ref 0.50–1.10)
GFR calc Af Amer: 10 mL/min — ABNORMAL LOW (ref >90)
GFR, EST NON AFRICAN AMERICAN: 9 mL/min — AB (ref >90)
Glucose, Bld: 72 mg/dL (ref 70–99)
POTASSIUM: 3.7 mmol/L (ref 3.5–5.1)
SODIUM: 138 mmol/L (ref 135–145)

## 2014-10-01 LAB — GLUCOSE, CAPILLARY
GLUCOSE-CAPILLARY: 106 mg/dL — AB (ref 70–99)
GLUCOSE-CAPILLARY: 46 mg/dL — AB (ref 70–99)

## 2014-10-01 MED ORDER — CALCIUM ACETATE 667 MG PO CAPS
667.0000 mg | ORAL_CAPSULE | Freq: Three times a day (TID) | ORAL | Status: DC
  Administered 2014-10-01 – 2014-10-03 (×2): 667 mg via ORAL
  Filled 2014-10-01 (×9): qty 1

## 2014-10-01 NOTE — Consult Note (Signed)
[facility] Face-to-Face Psychiatry Consult   Reason for Consult:   Reassessment  Referring Physician:  Dr. Virgina Evener [name] is an 33 y.o. female. Total Time spent with patient: 25 minutes   Assessment: AXIS I:  Depression  Secondary to Medical Illness  AXIS II:  Deferred AXIS III:   Past Medical History  Diagnosis Date  . 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 Hypothyroidism   . Hypertension   . Type I diabetes mellitus   . DVT (deep venous thrombosis)     "both arms" (11/11/2013)  . High cholesterol   . Diabetic retinopathy associated with type 1 diabetes mellitus   . Heart murmur     "slight" (11/11/2013)  . Graves' disease     "had thyroid radioactively treated" (11/11/2013)  . End-stage renal disease on hemodialysis     "TTS; [facility]" (11/11/2013)   AXIS IV:  Chronic medical issues and chronic pain, Blindness, limited support network AXIS V:  1-10 persistent dangerousness to self and others present  Plan:  Recommend psychiatric Inpatient admission when medically cleared.  Subjective:   Patient states she continues to feel " terrible". She states " my life is not worth it, I am 33 years old and I am blind, my kidneys do not work. I have nothing to live for "  Objective : Please review prior psychiatry consultation- last assessment is from 12/24.  Most recent hemodialysis  Was yesterday Patient remains depressed and continues to report suicidal ideations.  She describes low energy, and states " all I do here is sleep". Appetite is "OK". She states she would prefer to die because " really  my quality of life is crap". She states she has been thinking of choking herself. She presents depressed, irritable, and hopeless. She states " what is the use for an antidepressant, even if it works I am going to be blind and in pain". " It is not going to bring my kidneys back".  She states she does not want an antidepressant. She states " if I want to kill myself that  is my legal right". Patient is fairly responsive to support, empathy.    Past Psychiatric History: Past Medical History  Diagnosis Date  . 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 Hypothyroidism   . Hypertension   . Type I diabetes mellitus   . DVT (deep venous thrombosis)     "both arms" (11/11/2013)  . High cholesterol   . Diabetic retinopathy associated with type 1 diabetes mellitus   . Heart murmur     "slight" (11/11/2013)  . Graves' disease     "had thyroid radioactively treated" (11/11/2013)  . End-stage renal disease on hemodialysis     "TTS; [facility]" (11/11/2013)    Objective: Blood pressure 144/75, pulse 69, temperature 98.3 F (36.8 C), temperature source Oral, resp. rate 17, height $RemoveBe'5\' 3"'czAwVTMjB$  (1.6 m), weight 61.1 kg (134 lb 11.2 oz), SpO2 100 %.Body mass index is 23.87 kg/(m^2). Results for orders placed or performed during the hospital encounter of 09/25/14 (from the past 72 hour(s))  Glucose, capillary     Status: Abnormal   Collection Time: 09/28/14  6:04 PM  Result Value Ref Range   Glucose-Capillary 198 (H) 70 - 99 mg/dL  Glucose, capillary     Status: Abnormal   Collection Time: 09/28/14  9:05 PM  Result Value Ref Range   Glucose-Capillary 251 (H) 70 -  99 mg/dL  Glucose, capillary     Status: Abnormal   Collection Time: 09/29/14  7:36 AM  Result Value Ref Range   Glucose-Capillary 129 (H) 70 - 99 mg/dL  Glucose, capillary     Status: Abnormal   Collection Time: 09/29/14 12:00 PM  Result Value Ref Range   Glucose-Capillary 163 (H) 70 - 99 mg/dL  Glucose, capillary     Status: Abnormal   Collection Time: 09/29/14  3:49 PM  Result Value Ref Range   Glucose-Capillary 182 (H) 70 - 99 mg/dL  Glucose, capillary     Status: Abnormal   Collection Time: 09/29/14  8:58 PM  Result Value Ref Range   Glucose-Capillary 215 (H) 70 - 99 mg/dL  CBC     Status: Abnormal   Collection Time: 09/30/14  6:42 AM  Result Value Ref Range   WBC 3.9 (L) 4.0 - 10.5  K/uL   RBC 2.43 (L) 3.87 - 5.11 MIL/uL   Hemoglobin 7.5 (L) 12.0 - 15.0 g/dL   HCT 20.8 (L) 36.0 - 46.0 %   MCV 85.6 78.0 - 100.0 fL   MCH 30.9 26.0 - 34.0 pg   MCHC 36.1 (H) 30.0 - 36.0 g/dL   RDW 12.0 11.5 - 15.5 %   Platelets 147 (L) 150 - 400 K/uL  Renal function panel     Status: Abnormal   Collection Time: 09/30/14  6:42 AM  Result Value Ref Range   Sodium 134 (L) 135 - 145 mmol/L    Comment: Please note change in reference range.   Potassium 3.7 3.5 - 5.1 mmol/L    Comment: Please note change in reference range. DELTA CHECK NOTED    Chloride 98 96 - 112 mEq/L   CO2 25 19 - 32 mmol/L   Glucose, Bld 95 70 - 99 mg/dL   BUN 34 (H) 6 - 23 mg/dL   Creatinine, Ser 9.26 (H) 0.50 - 1.10 mg/dL   Calcium 8.6 8.4 - 10.5 mg/dL   Phosphorus 6.2 (H) 2.3 - 4.6 mg/dL   Albumin 3.2 (L) 3.5 - 5.2 g/dL   GFR calc non Af Amer 5 (L) >90 mL/min   GFR calc Af Amer 6 (L) >90 mL/min    Comment: (NOTE) The eGFR has been calculated using the CKD EPI equation. This calculation has not been validated in all clinical situations. eGFR's persistently <90 mL/min signify possible Chronic Kidney Disease.    Anion gap 11 5 - 15  Type and screen     Status: None   Collection Time: 09/30/14  9:15 AM  Result Value Ref Range   ABO/RH(D) B POS    Antibody Screen NEG    Sample Expiration 10/03/2014    Unit Number [id number]    Blood Component Type RED CELLS,LR    Unit division 00    Status of Unit ISSUED,FINAL    Transfusion Status OK TO TRANSFUSE    Crossmatch Result Compatible    Unit Number [id number]    Blood Component Type RED CELLS,LR    Unit division 00    Status of Unit ISSUED,FINAL    Transfusion Status OK TO TRANSFUSE    Crossmatch Result Compatible   Prepare RBC     Status: None   Collection Time: 09/30/14  9:30 AM  Result Value Ref Range   Order Confirmation ORDER PROCESSED BY BLOOD BANK   Glucose, capillary     Status: None   Collection Time: 09/30/14 12:05 PM  Result Value  Ref Range  Glucose-Capillary 77 70 - 99 mg/dL  Glucose, capillary     Status: Abnormal   Collection Time: 09/30/14  5:34 PM  Result Value Ref Range   Glucose-Capillary 386 (H) 70 - 99 mg/dL  Glucose, capillary     Status: None   Collection Time: 09/30/14  9:07 PM  Result Value Ref Range   Glucose-Capillary 86 70 - 99 mg/dL   Comment 1 Notify RN   CBC     Status: Abnormal   Collection Time: 10/01/14  5:15 AM  Result Value Ref Range   WBC 4.8 4.0 - 10.5 K/uL   RBC 3.31 (L) 3.87 - 5.11 MIL/uL   Hemoglobin 10.5 (L) 12.0 - 15.0 g/dL    Comment: REPEATED TO VERIFY   HCT 29.3 (L) 36.0 - 46.0 %   MCV 88.5 78.0 - 100.0 fL   MCH 31.7 26.0 - 34.0 pg   MCHC 35.8 30.0 - 36.0 g/dL   RDW 12.7 11.5 - 15.5 %   Platelets 151 150 - 400 K/uL  Basic metabolic panel     Status: Abnormal   Collection Time: 10/01/14  5:15 AM  Result Value Ref Range   Sodium 138 135 - 145 mmol/L    Comment: Please note change in reference range.   Potassium 3.7 3.5 - 5.1 mmol/L    Comment: Please note change in reference range.   Chloride 101 96 - 112 mEq/L   CO2 29 19 - 32 mmol/L   Glucose, Bld 72 70 - 99 mg/dL   BUN 22 6 - 23 mg/dL    Comment: DELTA CHECK NOTED   Creatinine, Ser 5.91 (H) 0.50 - 1.10 mg/dL    Comment: DELTA CHECK NOTED   Calcium 8.9 8.4 - 10.5 mg/dL   GFR calc non Af Amer 9 (L) >90 mL/min   GFR calc Af Amer 10 (L) >90 mL/min    Comment: (NOTE) The eGFR has been calculated using the CKD EPI equation. This calculation has not been validated in all clinical situations. eGFR's persistently <90 mL/min signify possible Chronic Kidney Disease.    Anion gap 8 5 - 15  Glucose, capillary     Status: Abnormal   Collection Time: 10/01/14  7:36 AM  Result Value Ref Range   Glucose-Capillary 46 (L) 70 - 99 mg/dL  Glucose, capillary     Status: Abnormal   Collection Time: 10/01/14  8:17 AM  Result Value Ref Range   Glucose-Capillary 106 (H) 70 - 99 mg/dL   Labs are reviewed and are pertinent for  anemia, hyperglycemia, increased creatinine ( 5.91)  , BUN 22 .  Current Facility-Administered Medications  Medication Dose Route Frequency Provider Last Rate Last Dose  . 0.9 %  sodium chloride infusion   Intravenous Once Elmarie Shiley, MD      . amLODipine (NORVASC) tablet 10 mg  10 mg Oral QHS Rosemarie Ax, MD   10 mg at 09/30/14 2142  . calcium acetate (PHOSLO) capsule 667 mg  667 mg Oral TID WC Elmarie Shiley, MD   667 mg at 10/01/14 1224  . carvedilol (COREG CR) 24 hr capsule 20 mg  20 mg Oral Daily Elysian N Rumley, DO   20 mg at 10/01/14 1004  . Darbepoetin Alfa (ARANESP) injection 100 mcg  100 mcg Intravenous Q Sat-HD Ramiro Harvest, PA-C   100 mcg at 09/30/14 0755  . doxercalciferol (HECTOROL) injection 3 mcg  3 mcg Intravenous Q T,Th,Sa-HD Myriam Jacobson, PA-C   3 mcg at  09/30/14 0742  . gabapentin (NEURONTIN) capsule 300 mg  300 mg Oral QHS Andrena Mews, MD   300 mg at 09/30/14 2142  . heparin injection 5,000 Units  5,000 Units Subcutaneous 3 times per day Rosemarie Ax, MD   5,000 Units at 10/01/14 1415  . hydrOXYzine (ATARAX/VISTARIL) tablet 10 mg  10 mg Oral TID PRN Olam Idler, MD   10 mg at 09/29/14 2152  . insulin aspart (novoLOG) injection 0-5 Units  0-5 Units Subcutaneous QHS Janora Norlander, DO   2 Units at 09/29/14 2203  . insulin aspart (novoLOG) injection 0-9 Units  0-9 Units Subcutaneous TID WC Rosemarie Ax, MD   5 Units at 10/01/14 1224  . insulin glargine (LANTUS) injection 10 Units  10 Units Subcutaneous BID Rosemarie Ax, MD   10 Units at 10/01/14 1004  . levothyroxine (SYNTHROID, LEVOTHROID) tablet 100 mcg  100 mcg Oral QAC breakfast Rosemarie Ax, MD   100 mcg at 10/01/14 0750  . lisinopril (PRINIVIL,ZESTRIL) tablet 20 mg  20 mg Oral BID Rosemarie Ax, MD   20 mg at 10/01/14 1004  . multivitamin (RENA-VIT) tablet 1 tablet  1 tablet Oral QHS Myriam Jacobson, PA-C   1 tablet at 09/30/14 2142  . pantoprazole (PROTONIX) EC tablet 40 mg  40 mg Oral  Daily Ashly M Gottschalk, DO   40 mg at 09/26/14 1352    Psychiatric Specialty Exam:     Blood pressure 144/75, pulse 69, temperature 98.3 F (36.8 C), temperature source Oral, resp. rate 17, height $RemoveBe'5\' 3"'CqGjfTXIM$  (1.6 m), weight 61.1 kg (134 lb 11.2 oz), SpO2 100 %.Body mass index is 23.87 kg/(m^2).  General Appearance: Fairly Groomed- in hospital garb   Eye Contact::  ( patient is blind)   Speech:  Normal Rate  Volume:  Normal  Mood:  Depressed and Irritable  Affect:  Constricted and irritable  Thought Process:  Goal Directed and Linear  Orientation:  Other:  fully alert and attentive  Thought Content:  no hallucinations, no delusions, focused on medical illnesses, quality of life issues.  Suicidal Thoughts:  Yes.  with intent/plan - as noted , has stated she has thought of choking self   Homicidal Thoughts:  No  Memory:  recent and remote grossly intact  Judgement:  Impaired  Insight:  Fair  Psychomotor Activity:  Normal  Concentration:  Good  Recall:  Good  Fund of Knowledge:Good  Language: Good  Akathisia:  Negative  Handed:  Right  AIMS (if indicated):     Assets:  Communication Skills Resilience  Sleep:      Musculoskeletal: Strength & Muscle Tone: within normal limits Gait & Station: gait not examined Patient leans: N/A  Treatment Plan Summary: 1. Patient presents with ongoing suicidal ideations and requires inpatient psychiatric treatment once medically cleared. 2. Continue sitter/ 1:1 observation due to risk of self harm 3. Patient currently refusing antidepressant medication.  4. Psychiatry consultant will follow with you and assist .   Darrill Vreeland, [facility] 10/01/2014 4:50 PM

## 2014-10-01 NOTE — Progress Notes (Signed)
Patient ID: [name], female   DOB: 08/25/1981, 33 y.o.   MRN: [id number]  [facility] Progress Note   Assessment/ Plan:   1. Suicidal ideation/depression - overwhelmed by medical problems/blindness/personal stressors; currently with a sitter by bedside for suicidal precautions-awaiting transfer to behavioral health unit. 2. ESRD - TTS per routine; last hemodialysis on Saturday and without any acute dialysis needs today. Next treatment will be on Tuesday. 3. Hypertension/volume - at EDW- blood pressure is fairly controlled and she is without any significant edema 4. Anemia - status post 2 units packed red cell transfusion yesterday with improved hemoglobin levels. 5. Metabolic bone disease - on Hectorol for PTH suppression. Elevated phosphorus levels noted-start calcium acetate 667 mg 3 times a day with meals 6. Nutrition - renal carb mod diet 7. Type 1 DM - per primary  8. Hypothyroidism on levothyroxine  9.Neuropathy - on 300 milligrams gabapentin, continue to monitor without any further up titration because of toxicity concerns  Subjective:   Reports to be doing well-anticipates meeting with the psychiatry team today to discuss her disposition (she informs me that there is apparent talk of transferring her over to [facility])    Objective:   BP 144/75 mmHg  Pulse 69  Temp(Src) 98.3 F (36.8 C) (Oral)  Resp 17  Ht 5\' 3"  (1.6 m)  Wt 61.1 kg (134 lb 11.2 oz)  BMI 23.87 kg/m2  SpO2 100%  Physical Exam: Gen: Comfortably resting in bed, sitter by bedside CVS: Pulse regular in rate and rhythm, S1 and S2 normal Resp: Clear to auscultation, no rales/rhonchi Abd: Soft, obese, nontender Ext: No lower extremity edema appreciated  Labs: BMET  Recent Labs Lab 09/25/14 1628 09/26/14 0837 09/27/14 0500 09/28/14 1000 09/30/14 0642 10/01/14 0515  NA 135* 133* 137 136 134* 138  K 5.3 4.5 3.7 4.6 3.7 3.7   CL 92* 94* 100 99 98 101  CO2 23 23 28 25 25 29   GLUCOSE 369* 134* 108* 177* 95 72  BUN 46* 58* 22 42* 34* 22  CREATININE 10.65* 12.38* 6.56* 10.24* 9.26* 5.91*  CALCIUM 9.7 8.6 8.8 8.6 8.6 8.9  PHOS  --  6.0* 5.4* 6.1* 6.2*  --    CBC  Recent Labs Lab 09/27/14 0500 09/28/14 1000 09/30/14 0642 10/01/14 0515  WBC 3.8* 3.2* 3.9* 4.8  HGB 8.3* 7.9* 7.5* 10.5*  HCT 24.0* 22.3* 20.8* 29.3*  MCV 87.3 85.8 85.6 88.5  PLT 166 172 147* 151   Medications:    . sodium chloride   Intravenous Once  . amLODipine  10 mg Oral QHS  . carvedilol  20 mg Oral Daily  . darbepoetin (ARANESP) injection - DIALYSIS  100 mcg Intravenous Q Sat-HD  . doxercalciferol  3 mcg Intravenous Q T,Th,Sa-HD  . gabapentin  300 mg Oral QHS  . heparin  5,000 Units Subcutaneous 3 times per day  . insulin aspart  0-5 Units Subcutaneous QHS  . insulin aspart  0-9 Units Subcutaneous TID WC  . insulin glargine  10 Units Subcutaneous BID  . levothyroxine  100 mcg Oral QAC breakfast  . lisinopril  20 mg Oral BID  . multivitamin  1 tablet Oral QHS  . pantoprazole  40 mg Oral Daily   Zetta BillsJay Jesslyn Viglione, MD 10/01/2014, 10:03 AM

## 2014-10-01 NOTE — Clinical Social Work Note (Signed)
CSW spoke with Tianni Escamilla (intake [facility]) who states at capacity. Per Boneta Standre, patient's clinical information will be reviewed on Monday, 10/02/14. CSW to make MD and RN aware of the above.   Ethyl Vila Patrick-[place], LCSWA Weekend Clinical Social Worker [phone]

## 2014-10-01 NOTE — Progress Notes (Signed)
Family Medicine Teaching Service Daily Progress Note Intern Pager: [phone]  Patient name: [name] Medical record number: [id number] Date of birth: 01/03/1981 [phone]: 33 y.o. Gender: female  Primary Care Provider: Hazeline JunkerGrunz, Ryan, MD Consultants: Renal, Psych  Code Status: Full   Pt Overview and Major Events to Date:  12/21: Admitted  12/22: Seen by psych 12/24: Psych rec inpatient psych admission at facility with HD. Patient agreeable. CSW pending bed.           - Medically stable to be transferred (will continue HD) 12/25: Right hip pain resolved. Pending placement. CSW update - no beds today. 12/26 - CSW update pending 12/27: CBG 46 overnight.  Assessment and Plan: [name] is a 33 y.o. female presenting with SI . PMH is significant for T1DM, Diabetic retinopathy causing blindness, ESRD, HTN, hypothyroidism, peripheral polyneuropathy, h/o DVT of UE.   Suicidal Ideation/Grief - Stable - Currently denies suicidal or homicidal ideation  - c/s to Psych, greatly appreciate assistance      - Last seen 12/24, continue to recommend admission to inpatient psych. Also recommend med therapy with anti-depressant (patient not interested).  Dr Jama Flavorsobos to see this pm to reassess.      - Continue bedside sitter      - Psych CSW coordinating bed placement. Clinicals faxed to facilities with HD      - 12/25: CSW states no beds available, still pending for today - c/s to spiritual care, appreciate visits  Pain in right groin - Resolved - Venous duplex negative for DVT. Right Hip xray negative for acute abnormalities. - Tylenol PRN - Monitor next menstrual cycle to see if pain returns - Consider outpatient pelvic u/s  ESRD on HD (T, Th, Sat): - Currently in HD.  - c/s Renal, greatly appreciate assistance - Continue Renavite, Aranesp   T1DM: 07/2014 A1c: 7.5. Brittle in nature and followed by Endocrinology. CBG 46 this am. - Continue to monitor BGs - Lantus decreased from home dose to  10 U BID - Will consider decreasing Lantus if patient continues to have low CBGs  - sensitive SSI TID & HS  Anemia, normocytic Hgb today 10.5 - Likely anemia of chronic renal disease - Continue aranesp per nephro  - Transfusion threshold 7  HTN - Stable  - Continue home Norvasc 10 mg QD - Coreg-24 hr 20mg  daily - Lisinopril 20 mg BID  Hypothyroidism - Continue home Levothyroxine  Peripheral polyneuropathy: - Continue home Neurontin 300mg  qhs (max dose per renal)  FEN/GI: Carb modified diet, Protonix Prophylaxis: sub-q heparin  Disposition: Medically stable for discharge to admit Inpatient Psychiatry (pending bed availability). - 12/25: per CSW, no beds available today, anticipate potential DC 12/26, however may take few days for facility with HD to become available. - 12/26 - awaiting update from CSW  Subjective:  Patient reports that she is doing well.  She continues to not have R hip/pelvic pain anymore since the stop of her menses.  She states that she is ready to [facility] go home [facility]  She voices concerns over increasing hospital bill waiting for placement.  She voices good understanding of current plan and is looking forward to seeing the psychiatrist this afternoon.  She denies any abdominal pain, dizziness.  She denies any concerns at this time.  Objective: Temp:  [97.5 F (36.4 C)-99.4 F (37.4 C)] 98.2 F (36.8 C) (12/27 0514) Pulse Rate:  [64-71] 66 (12/27 0514) Resp:  [16-19] 16 (12/27 0514) BP: (  126-180)/(71-88) 126/75 mmHg (12/27 0514) SpO2:  [99 %-100 %] 99 % (12/27 0514) Weight:  [134 lb 11.2 oz (61.1 kg)] 134 lb 11.2 oz (61.1 kg) (12/26 1120) Physical Exam: General: lying in bed, NAD, sitter at bedside HEENT: [place]/AT, MMM, left eye prosthesis  Cardiovascular: RRR, S1/S2, blowing systolic murmur Respiratory: CTAB, no increased WOB Abd: soft, NTND, +BS, no masses palpated  Extremities: WWP, no edema Neuro: blind, otherwise no gross  deficits, follows commands Psych: affect appropriate, mood stable, normal speech  Laboratory:  Recent Labs Lab 09/28/14 1000 09/30/14 0642 10/01/14 0515  WBC 3.2* 3.9* 4.8  HGB 7.9* 7.5* 10.5*  HCT 22.3* 20.8* 29.3*  PLT 172 147* 151    Recent Labs Lab 09/25/14 1628  09/28/14 1000 09/30/14 0642 10/01/14 0515  NA 135*  < > 136 134* 138  K 5.3  < > 4.6 3.7 3.7  CL 92*  < > 99 98 101  CO2 23  < > 25 25 29   BUN 46*  < > 42* 34* 22  CREATININE 10.65*  < > 10.24* 9.26* 5.91*  CALCIUM 9.7  < > 8.6 8.6 8.9  PROT 8.6*  --   --   --   --   BILITOT 0.4  --   --   --   --   ALKPHOS 105  --   --   --   --   ALT 9  --   --   --   --   AST 10  --   --   --   --   GLUCOSE 369*  < > 177* 95 72  < > = values in this interval not displayed.   Raliegh IpAshly M Josua Ferrebee, DO 10/01/2014, 9:05 AM PGY-1, [facility] FPTS Intern pager: [phone], text pages welcome

## 2014-10-01 NOTE — Progress Notes (Signed)
Hypoglycemic Event  CBG: 46  Treatment: carb snack  Symptoms: none  Follow-up CBG: Time:0817 CBG Result:106  Possible Reasons for Event: unknown  Comments/MD notified:yes    [place], [name]  Remember to initiate Hypoglycemia Order Set & complete

## 2014-10-02 LAB — CBC
HCT: 30.1 % — ABNORMAL LOW (ref 36.0–46.0)
Hemoglobin: 10.6 g/dL — ABNORMAL LOW (ref 12.0–15.0)
MCH: 30.5 pg (ref 26.0–34.0)
MCHC: 35.2 g/dL (ref 30.0–36.0)
MCV: 86.5 fL (ref 78.0–100.0)
PLATELETS: 157 10*3/uL (ref 150–400)
RBC: 3.48 MIL/uL — ABNORMAL LOW (ref 3.87–5.11)
RDW: 12.4 % (ref 11.5–15.5)
WBC: 4.1 10*3/uL (ref 4.0–10.5)

## 2014-10-02 LAB — GLUCOSE, CAPILLARY
GLUCOSE-CAPILLARY: 262 mg/dL — AB (ref 70–99)
Glucose-Capillary: 119 mg/dL — ABNORMAL HIGH (ref 70–99)
Glucose-Capillary: 271 mg/dL — ABNORMAL HIGH (ref 70–99)
Glucose-Capillary: 527 mg/dL — ABNORMAL HIGH (ref 70–99)
Glucose-Capillary: 286 mg/dL — ABNORMAL HIGH (ref 70–99)
Glucose-Capillary: 164 mg/dL — ABNORMAL HIGH (ref 70–99)

## 2014-10-02 MED ORDER — INSULIN ASPART 100 UNIT/ML ~~LOC~~ SOLN
10.0000 [IU] | Freq: Once | SUBCUTANEOUS | Status: AC
  Administered 2014-10-02: 10 [IU] via SUBCUTANEOUS

## 2014-10-02 MED ORDER — HYDRALAZINE HCL 20 MG/ML IJ SOLN: 5.0000 mg | Freq: Four times a day (QID) | INTRAMUSCULAR | Status: DC | PRN

## 2014-10-02 NOTE — Progress Notes (Signed)
Patient refused medications today.  MD notified.

## 2014-10-02 NOTE — Progress Notes (Addendum)
PGY-3 Interim Note  Paged by RN around 8:00 PM for pt refusing meds (Norvasc, lisinopril, gabapentin), though is agreeable to insulin and states she "doesn't plan on refusing dialysis, tomorrow." Long conversation with pt [name]([name] with psychiatry present for almost entire conversation) in which she voiced several frustrations with how long it is taking to find inpt psychiatry, how she wants to stop dialysis but denies that this is a suicidal gesture, and her feelings that her blood pressure is not well controlled whether she takes medications or not. Pt voiced several times that she is "tired" of being in the hospital, "tired" of being in pain, "tired" of having to go to dialysis, and so on. Pt states she was agreeable to "everything" (medications, insulin, HD, etc, while awaiting inpt psych bed) "early last week" because she did not expect everything to take as long as it has; she expresses frustration about being told that SW would be up to talk to her today but no one did; and she feels that she is "cut off from the world" and "isolated in a very small room."  Discussed at length with pt that her providers' priority is her safety, and that we intend to treat her medical conditions while finding her an inpt psych bed -- that this is the "game plan" (pt's words), but that it may take a long time to get everything ready. Pt is stable from a medical standpoint for discharge to inpt psych; she is currently IVC'd, which was explained to pt, to which she states, "that is unfair and inconsiderate," but she is currently agreeable to taking medications for blood pressure, continuing insulin, and having HD done while a bed is sought.  Plan: Continue current medications, plans for HD tomorrow, etc, as per earlier progress note, today. Additionally, Ms [name] states she will work on getting a Child psychotherapistsocial worker to visit with pt tomorrow to help arrange safe discharge planning, as well as discussing with [facility] and [facility]  for possible admission to one of those places [facility]. Discussed with nursing that pt is agreeable to the above, at this time.  Susan Mortonhristopher M Street, MD PGY-3, [facility] 10/02/2014, 9:17 PM   Addendum: Corrected 'service' marker to [facility] (initially entered as Pediatrics)

## 2014-10-02 NOTE — Consult Note (Signed)
Subjective:  Nurses reported that patient stated last night that she will refuse further treatment, including meds and dialysis; voiced no complaints this morning  Objective: Vital signs in last 24 hours: Temp:  [98 F (36.7 C)-99.1 F (37.3 C)] 98 F (36.7 C) (12/28 0607) Pulse Rate:  [67-70] 68 (12/28 0607) Resp:  [17-18] 18 (12/28 0607) BP: (144-179)/(75-86) 171/80 mmHg (12/28 0607) SpO2:  [100 %] 100 % (12/28 0607) Weight:  [61.1 kg (134 lb 11.2 oz)] 61.1 kg (134 lb 11.2 oz) (12/27 2118) Weight change: 0.5 kg (1 lb 1.6 oz)  Intake/Output from previous day: 12/27 0701 - 12/28 0700 In: 960 [P.O.:960] Out: -  Intake/Output this shift:   Lab Results:  Recent Labs  09/30/14 0642 10/01/14 0515  WBC 3.9* 4.8  HGB 7.5* 10.5*  HCT 20.8* 29.3*  PLT 147* 151   BMET:  Recent Labs  09/30/14 0642 10/01/14 0515  NA 134* 138  K 3.7 3.7  CL 98 101  CO2 25 29  GLUCOSE 95 72  BUN 34* 22  CREATININE 9.26* 5.91*  CALCIUM 8.6 8.9  ALBUMIN 3.2*  --    No results for input(s): PTH in the last 72 hours. Iron Studies: No results for input(s): IRON, TIBC, TRANSFERRIN, FERRITIN in the last 72 hours.  Studies/Results: No results found.  EXAM: General appearance:  Alert, cooperative, in no apparent distress Resp:  CTA without rales, rhonchi, or wheezes Cardio:  RRR without murmur or rub GI:  + BS, soft and nontender Extremities:  No edema Access:  R thigh AVG with + bruit  Dialysis: [facility] TTS 4h   2/2.5 Bath   61kg    400/A1.5     Heparin 3000    R thigh AVG Aranesp 25/ wk,  Hectorol 3 ug TIW Recent labs Hgb 9 K 6.5 tsat 50% iPTH 330  Assessment/Plan: 1. Suicidal ideation/depression - overwhelmed by medical problems/blindness/personal stressors; currently with sitter for suicidal precautions, awaiting transfer to behavioral health unit. 2. ESRD - HD on TTS @ [facility], K 3.7.  HD tomorrow. 3. HTN/Volume - BP 171/80, on Amlodipine 10 mg qhs, Carvedilol 20 mg qd, Lisinopril 20 mg bid;  wt 61.1 kg, no excess fluid. 4. Anemia - Hgb 10.5, s/p 2 U PRBCs 12/26, Aranesp 100 mcg on Sat.  5. Sec HPT - Ca 8.9 (9.5 corrected), P 6.2; Hectorol 3 mcg, Phoslo with meals. 6. Nutrition - Alb 3.2, renal carb-mod diet, vitamin. 7. DM Type 1 - per primary. 8. Hypothyroidism - on Levothyroxine. 9. Neuropathy - on Gabapentin 300 mg qhs.    LOS: 7 days   LYLES,CHARLES 10/02/2014,9:02 AM  Pt seen, examined and agree w A/P as above.  Vinson Moselleob Naylani Bradner MD pager [phone]    cell [phone] 10/02/2014, 1:36 PM

## 2014-10-02 NOTE — Progress Notes (Signed)
Family Medicine Teaching Service Daily Progress Note Intern Pager: [phone]  Patient name: [name] Force Medical record number: [id number] Date of birth: 05/24/1981 Age: 33 y.o. Gender: female  Primary Care Provider: Hazeline JunkerGrunz, Ryan, MD Consultants: Renal, Psych  Code Status: Full   Pt Overview and Major Events to Date:  12/21: Admitted  12/22: Seen by psych 12/24: Psych rec inpatient psych admission at facility with HD. Patient agreeable. CSW pending bed.           - Medically stable to be transferred (will continue HD) 12/25: Right hip pain resolved. Pending placement. CSW update - no beds today. 12/26 - CSW update pending 12/27: CBG 46 overnight. 12/28: Patient IVC'd overnight. Continues to refuse all medications.  Assessment and Plan: [name] Whedbee is a 33 y.o. female presenting with SI . PMH is significant for T1DM, Diabetic retinopathy causing blindness, ESRD, HTN, hypothyroidism, peripheral polyneuropathy, h/o DVT of UE.   Suicidal Ideation/Grief - Refusing all medications - Currently denies suicidal or homicidal ideation  - c/s to Psych, greatly appreciate assistance      - Last seen 12/27, continue to recommend admission to inpatient psych. Also recommend med therapy with anti-depressant (patient not interested).  To be reassessed today. Called J Lord, PA and left message to please call service for assessment and possible involuntary administration of meds until patient has returned to mental competence      - Continue bedside sitter      - Psych CSW coordinating bed placement. Clinicals faxed to facilities with HD      - 12/25: CSW states no beds available, spoke to SW.  She reports that bed is still pending.  Patient is under review.  She does not anticipate placement today.  She states that she will contact renal secretary for arrangement of transportation for dialysis in [place], [place] [facility] - c/s to spiritual care, appreciate visits  Pain in right groin -  Resolved - Venous duplex negative for DVT. Right Hip xray negative for acute abnormalities. - Tylenol PRN - Monitor next menstrual cycle to see if pain returns - Consider outpatient pelvic u/s  ESRD on HD (T, Th, Sat): - c/s Renal, greatly appreciate assistance - Continue Renavite, Aranesp   T1DM: 07/2014 A1c: 7.5. Brittle in nature and followed by Endocrinology.  - Continue to monitor BGs - Lantus decreased from home dose to 10 U BID - Will consider decreasing Lantus if patient continues to have low CBGs  - sensitive SSI TID & HS  Anemia, normocytic  - Likely anemia of chronic renal disease - Continue aranesp per nephro  - Transfusion threshold 7  HTN - BP 171/80 Patient refusing all medications at this time - IV Hydralazine ordered for systolics >160 - Continue home Norvasc 10 mg QD if possible - Coreg-24 hr 20mg  daily if possible - Lisinopril 20 mg BID if possible  Hypothyroidism - Continue home Levothyroxine  Peripheral polyneuropathy: - Continue home Neurontin 300mg  qhs (max dose per renal)  FEN/GI: Carb modified diet, Protonix Prophylaxis: sub-q heparin  Disposition: Medically stable for discharge to admit Inpatient Psychiatry (pending bed availability). - 12/25: per CSW, no beds available today, anticipate potential DC 12/26, however may take few days for facility with HD to become available. - 12/26 - awaiting update from CSW  Subjective:  Patient reports that she is "alright".  She reports that she is tired of feeling pain and that she wanted to leave yesterday, but that she was being kept against  her will.  She states that she was seen by Psychiatry yesterday and that "he did everything he could to run out the room", "all he wants to do is give me a happy pill but a pill is not going to change the stuff I have to deal with in my life", "the pain will still be there and I will still have no mother or grandmother or anywhere to live."  She voiced concern over  finances, stating she was not eligible for medicaid or food stamps because of her SS checks.  When asked where she was planning on going yesterday, she states she was going to stay on her friend's couch then taking a bus to [place] to stay with her sister.  She reports just being tired of being in the hospital.  Objective: Temp:  [98 F (36.7 C)-99.1 F (37.3 C)] 98 F (36.7 C) (12/28 0607) Pulse Rate:  [67-70] 68 (12/28 0607) Resp:  [17-18] 18 (12/28 0607) BP: (144-179)/(75-86) 171/80 mmHg (12/28 0607) SpO2:  [100 %] 100 % (12/28 0607) Weight:  [134 lb 11.2 oz (61.1 kg)] 134 lb 11.2 oz (61.1 kg) (12/27 2118) Physical Exam: General: well nourished female, lying in bed, NAD, sitter at bedside HEENT: [place]/AT, MMM, left eye prosthesis  Cardiovascular: RRR, S1/S2, blowing systolic murmur Respiratory: CTAB, no increased WOB Abd: soft, NTND, +BS, no masses palpated  Extremities: WWP, no edema Neuro: blind, otherwise no gross deficits, follows commands Psych: mood depressed, speech whispered, patient appears very hopeless, sad and teary eyed  Laboratory:  Recent Labs Lab 09/28/14 1000 09/30/14 0642 10/01/14 0515  WBC 3.2* 3.9* 4.8  HGB 7.9* 7.5* 10.5*  HCT 22.3* 20.8* 29.3*  PLT 172 147* 151    Recent Labs Lab 09/25/14 1628  09/28/14 1000 09/30/14 0642 10/01/14 0515  NA 135*  < > 136 134* 138  K 5.3  < > 4.6 3.7 3.7  CL 92*  < > 99 98 101  CO2 23  < > 25 25 29   BUN 46*  < > 42* 34* 22  CREATININE 10.65*  < > 10.24* 9.26* 5.91*  CALCIUM 9.7  < > 8.6 8.6 8.9  PROT 8.6*  --   --   --   --   BILITOT 0.4  --   --   --   --   ALKPHOS 105  --   --   --   --   ALT 9  --   --   --   --   AST 10  --   --   --   --   GLUCOSE 369*  < > 177* 95 72  < > = values in this interval not displayed.   Raliegh IpAshly [name] Clair Bardwell, DO 10/02/2014, 9:05 AM PGY-1, [place] [facility] FPTS Intern pager: [phone], text pages welcome

## 2014-10-02 NOTE — Progress Notes (Signed)
Pt stated she wanted to leave AMA. MD was notified and came to assess patient. Involuntary commitment forms were filled out by MD at 2250. Will continue to monitor pt.

## 2014-10-02 NOTE — Clinical Social Work Psych Note (Signed)
Psych CSW reviewed disposition with MD.  Patient last seen by psychiatrist Cobos MD on 12/27 who continues to recommend inpatient psychiatric hospitalization.     - [facility]  Psych CSW was informed patient is now refusing medications.  Psych CSW recommends psych re-consult for possible medication management/bridge.    Psych CSW [facility] who also recommends possible Palliative Care consult pending MD agreeability.  Psych CSW agrees with this recommendation.  Paged MD and made aware of these recommendations.  Vickii PennaGina Ezmeralda Stefanick, LCSWA [phone]  Psychiatric & Orthopedics (5N 1-16) Clinical Social Worker

## 2014-10-02 NOTE — Progress Notes (Signed)
Patient refusing all evening medications today, except for insulin.  Notified MD on call.  Will continue to monitor.

## 2014-10-03 DIAGNOSIS — F332 Major depressive disorder, recurrent severe without psychotic features: Secondary | ICD-10-CM | POA: Insufficient documentation

## 2014-10-03 LAB — GLUCOSE, CAPILLARY
GLUCOSE-CAPILLARY: 100 mg/dL — AB (ref 70–99)
GLUCOSE-CAPILLARY: 213 mg/dL — AB (ref 70–99)
Glucose-Capillary: 127 mg/dL — ABNORMAL HIGH (ref 70–99)
Glucose-Capillary: 207 mg/dL — ABNORMAL HIGH (ref 70–99)

## 2014-10-03 MED ORDER — TRIAMCINOLONE ACETONIDE 0.5 % EX CREA
TOPICAL_CREAM | Freq: Two times a day (BID) | CUTANEOUS | Status: DC
  Administered 2014-10-03: 18:00:00 via TOPICAL
  Filled 2014-10-03: qty 15

## 2014-10-03 MED ORDER — ALTEPLASE 2 MG IJ SOLR
2.0000 mg | Freq: Once | INTRAMUSCULAR | Status: DC | PRN
  Filled 2014-10-03: qty 2

## 2014-10-03 MED ORDER — HEPARIN SODIUM (PORCINE) 1000 UNIT/ML DIALYSIS
3000.0000 [IU] | Freq: Once | INTRAMUSCULAR | Status: DC
  Filled 2014-10-03: qty 3

## 2014-10-03 MED ORDER — HEPARIN SODIUM (PORCINE) 1000 UNIT/ML DIALYSIS
1000.0000 [IU] | INTRAMUSCULAR | Status: DC | PRN
  Filled 2014-10-03: qty 1

## 2014-10-03 MED ORDER — LIDOCAINE HCL (PF) 1 % IJ SOLN: 5.0000 mL | INTRAMUSCULAR | Status: DC | PRN

## 2014-10-03 MED ORDER — NEPRO/CARBSTEADY PO LIQD: 237.0000 mL | ORAL | Status: DC | PRN

## 2014-10-03 MED ORDER — SODIUM CHLORIDE 0.9 % IV SOLN: 100.0000 mL | INTRAVENOUS | Status: DC | PRN

## 2014-10-03 MED ORDER — LISINOPRIL 20 MG PO TABS
20.0000 mg | ORAL_TABLET | Freq: Two times a day (BID) | ORAL | Status: DC
Start: 2014-10-03 — End: 2014-10-04
  Administered 2014-10-03: 20 mg via ORAL
  Filled 2014-10-03: qty 1

## 2014-10-03 MED ORDER — LIDOCAINE-PRILOCAINE 2.5-2.5 % EX CREA: 1.0000 "application " | TOPICAL_CREAM | CUTANEOUS | Status: DC | PRN

## 2014-10-03 MED ORDER — DOXERCALCIFEROL 4 MCG/2ML IV SOLN
INTRAVENOUS | Status: AC
  Filled 2014-10-03: qty 2

## 2014-10-03 MED ORDER — PENTAFLUOROPROP-TETRAFLUOROETH EX AERO: 1.0000 "application " | INHALATION_SPRAY | CUTANEOUS | Status: DC | PRN

## 2014-10-03 NOTE — Procedures (Signed)
I was present at this dialysis session, have reviewed the session itself and made  appropriate changes  Vinson Moselleob Johnae Friley MD (pgr) [phone]    [phone] 10/03/2014, 1:58 PM

## 2014-10-03 NOTE — Progress Notes (Signed)
Family Medicine Teaching Service Daily Progress Note Intern Pager: [phone]  Patient name: [name] Medical record number: [id number] Date of birth: 10/10/1980 Age: 33 y.o. Gender: female  Primary Care Provider: Hazeline JunkerGrunz, Ryan, MD Consultants: Renal, Psych  Code Status: Full   Pt Overview and Major Events to Date:  12/21: Admitted  12/22: Seen by psych 12/24: Psych rec inpatient psych admission at facility with HD. Patient agreeable. CSW pending bed.           - Medically stable to be transferred (will continue HD) 12/25: Right hip pain resolved. Pending placement. CSW update - no beds today. 12/26 - CSW update pending 12/27: CBG 46 overnight. 12/28: Patient IVC'd overnight. Continues to refuse all medications. 12/29: taking some medications this morning; in better spirits; resubmission of paperwork to [facility]  Assessment and Plan: [name] is a 33 y.o. female presenting with SI . PMH is significant for T1DM, Diabetic retinopathy causing blindness, ESRD, HTN, hypothyroidism, peripheral polyneuropathy, h/o DVT of UE.   Suicidal Ideation/Grief - Refusing all medications initially but better this morning  - Currently denies suicidal or homicidal ideation  - Psych following, greatly appreciate assistance      - Continue bedside sitter      - Psych CSW coordinating bed placement. Clinicals faxed to facilities with HD - c/s to spiritual care, appreciate visits  T1DM: 07/2014 A1c: 7.5. Brittle in nature and followed by Endocrinology.  - Continue to monitor BGs (CBGs improved now that pt taking medication) - Lantus decreased from home dose to 10 U BID (since 12/22) - sensitive SSI TID & HS  Pain in right groin - Resolved - Venous duplex negative for DVT. Right Hip xray negative for acute abnormalities. - Tylenol PRN - Monitor next menstrual cycle to see if pain returns - Consider outpatient pelvic u/s  ESRD on HD (T, Th, Sat): - c/s Renal, greatly appreciate  assistance - Continue Renavite, Aranesp   Anemia, normocytic  - Likely anemia of chronic renal disease (likely causing flow murmur) - Continue aranesp per nephro  - Transfusion threshold 7  HTN - BPs improved  - IV Hydralazine ordered for systolics >160 - Continue home Norvasc 10 mg QD - Coreg-24 hr 20mg  daily if possible - Lisinopril 20 mg BID if possible  Hypothyroidism - Continue home Levothyroxine  Peripheral polyneuropathy: - Continue home Neurontin 300mg  qhs (max dose per renal)  FEN/GI: Carb modified diet, Protonix Prophylaxis: sub-q heparin  Disposition: Medically stable for discharge to admit Inpatient Psychiatry (pending bed availability). - 12/25: per CSW, no beds available today, anticipate potential DC 12/26, however may take few days for facility with HD to become available. - 12/29: [facility] beds [facility]; [facility]; awaiting return phone call from psychiatry - 12/29: resubmission to [facility]  Subjective:  Feeling much better this morning after discussion yesterday. Is amenable to waiting in the hospital at this time  Objective: Temp:  [98 F (36.7 C)-99 F (37.2 C)] 98 F (36.7 C) (12/29 0500) Pulse Rate:  [63-68] 65 (12/29 0500) Resp:  [18-20] 18 (12/29 0500) BP: (147-181)/(74-88) 147/77 mmHg (12/29 0500) SpO2:  [98 %-100 %] 100 % (12/29 0500) Weight:  [134 lb 1.6 oz (60.827 kg)] 134 lb 1.6 oz (60.827 kg) (12/28 2100) Physical Exam: General: well nourished female, lying in bed, NAD, sitter at bedside HEENT: [place]/AT, MMM, left eye prosthesis  Cardiovascular: RRR, S1/S2, blowing systolic murmur Respiratory: CTAB, no increased WOB Abd: soft, NTND, +BS, no masses  palpated  Extremities: WWP, no edema Neuro: blind, otherwise no gross deficits, follows commands Psych: mood depressed, but overall improved   Laboratory:  Recent Labs Lab 09/30/14 0642 10/01/14 0515 10/02/14 0814  WBC 3.9* 4.8 4.1  HGB 7.5* 10.5* 10.6*  HCT  20.8* 29.3* 30.1*  PLT 147* 151 157    Recent Labs Lab 09/28/14 1000 09/30/14 0642 10/01/14 0515  NA 136 134* 138  K 4.6 3.7 3.7  CL 99 98 101  CO2 25 25 29   BUN 42* 34* 22  CREATININE 10.24* 9.26* 5.91*  CALCIUM 8.6 8.6 8.9  GLUCOSE 177* 95 72     Charlane FerrettiMelanie C Milferd Ansell, MD 10/03/2014, 9:10 AM PGY-2, [facility] FPTS Intern pager: [phone], text pages welcome

## 2014-10-03 NOTE — Progress Notes (Signed)
Sheriff's dept called and message left re: pt's pick-up for transport to [facility].  This VM is picked up intermittently until midnight, so CSW unsure what time pt may be picked up.  Packet prepared and placed with shadow chart.  IVC paperwork placed in chart.  Nursing staff updated.

## 2014-10-03 NOTE — Progress Notes (Signed)
Subjective:  No cos / agrees to HD today and wanting help with her Depression  Objective Vital signs in last 24 hours: Filed Vitals:   10/02/14 2100 10/03/14 0030 10/03/14 0500 10/03/14 1029  BP: 181/88 171/74 147/77 173/88  Pulse: 63 64 65 65  Temp: 98.3 F (36.8 C)  98 F (36.7 C) 98.3 F (36.8 C)  TempSrc: Oral  Oral Oral  Resp: 18  18 18   Height:      Weight: 60.827 kg (134 lb 1.6 oz)     SpO2: 100%  100% 100%   Weight change: -0.272 kg (-9.6 oz)  Physical Exam: General appearance: Alert, cooperative,NAD Resp: CTA  bilat Cardio: RRR without murmur or rub or gallop GI: + BS, soft and non tender/ non distended  Extremities: No  Pedal edema Access: R thigh AVGG= + bruit  Dialysis: [facility] T[facility] 4h 2/2.5 Bath 61kg 400/A1.5 Heparin 3000 R thigh AVG Aranesp 25/ wk, Hectorol 3 ug TIW Recent labs Hgb 9 K 6.5 tsat 50% iPTH 330  Problem/Plan: 1. Suicidal ideation/depression - she is  overwhelmed by medical problems/blindness/personal stressors;Lives alone/ currently with sitter for suicidal precautions, awaiting transfer to behavioral health unit. 2. ESRD - HD on TTS @ [facility]. HD today. 3. HTN/Volume - BP 173/88 on Amlodipine 10 mg qhs, Carvedilol 20 mg qd, Lisinopril 20 mg bid; last  wt 60.8 kg pending hd today, no excess volume BP ^ sec to her refusing  Prior Meds 4. Anemia - Hgb 10.6, s/p 2 U PRBCs 12/26, Aranesp 100 mcg on Sat.  5. Sec HPT - Ca 8.9 (9.5 corrected), P 6.2; Hectorol 3 mcg, Phoslo binder was refusing prior now taking 6. Nutrition - Alb 3.2, renal carb-mod diet, vitamin. 7. DM Type 1 - per primary. 8. Hypothyroidism - on Levothyroxine. 9. Neuropathy - on Gabapentin 300 mg qhs   Lenny Pastelavid Zeyfang, PA-C [facility] Bee[facility] [phone] 10/03/2014,11:20 AM  LOS: 8 days   Pt seen, examined and agree w A/P as above. Doing better mentally, agreed to do HD today.  Has complaints of intermittent R thigh pain and numbness , and reports declining  access flows at the OP unit.  Said she had [facility] recently , will try to get these results.  Vinson Moselleob Dakhari Zuver MD pager [phone]    cell [phone] 10/03/2014, 1:57 PM    Labs: Basic Metabolic Panel:  Recent Labs Lab 09/27/14 0500 09/28/14 1000 09/30/14 0642 10/01/14 0515  NA 137 136 134* 138  K 3.7 4.6 3.7 3.7  CL 100 99 98 101  CO2 28 25 25 29   GLUCOSE 108* 177* 95 72  BUN 22 42* 34* 22  CREATININE 6.56* 10.24* 9.26* 5.91*  CALCIUM 8.8 8.6 8.6 8.9  PHOS 5.4* 6.1* 6.2*  --    Liver Function Tests:  Recent Labs Lab 09/27/14 0500 09/28/14 1000 09/30/14 0642  ALBUMIN 3.1* 3.2* 3.2*  CBC:  Recent Labs Lab 09/27/14 0500 09/28/14 1000 09/30/14 0642 10/01/14 0515 10/02/14 0814  WBC 3.8* 3.2* 3.9* 4.8 4.1  HGB 8.3* 7.9* 7.5* 10.5* 10.6*  HCT 24.0* 22.3* 20.8* 29.3* 30.1*  MCV 87.3 85.8 85.6 88.5 86.5  PLT 166 172 147* 151 157   Cardiac Enzymes: No results for input(s): CKTOTAL, CKMB, CKMBINDEX, TROPONINI in the last 168 hours. CBG:  Recent Labs Lab 10/01/14 2117 10/02/14 1214 10/02/14 1651 10/02/14 2231 10/03/14 0745  GLUCAP 271* 527* 286* 164* 127*    Studies/Results: No results found. Medications:   . sodium chloride  Intravenous Once  . amLODipine  10 mg Oral QHS  . calcium acetate  667 mg Oral TID WC  . carvedilol  20 mg Oral Daily  . darbepoetin (ARANESP) injection - DIALYSIS  100 mcg Intravenous Q Sat-HD  . doxercalciferol  3 mcg Intravenous Q T,Th,Sa-HD  . gabapentin  300 mg Oral QHS  . heparin  5,000 Units Subcutaneous 3 times per day  . insulin aspart  0-5 Units Subcutaneous QHS  . insulin aspart  0-9 Units Subcutaneous TID WC  . insulin glargine  10 Units Subcutaneous BID  . levothyroxine  100 mcg Oral QAC breakfast  . lisinopril  20 mg Oral BID  . multivitamin  1 tablet Oral QHS  . pantoprazole  40 mg Oral Daily  . triamcinolone cream   Topical BID
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
## 2014-10-03 NOTE — Progress Notes (Signed)
BP 171/74.  Resting peacefully.  In no acute distress.  Notified MD on call.  No new orders received.  Will continue to monitor.

## 2014-10-03 NOTE — Clinical Social Work Psych Assess (Signed)
Psych CSW spoke with Charge at [facility] who states no beds available and waitlist is lengthy.  Psych CSW spoke with admission's intake at [facility] regarding possible admission and admission's intake coordinator states patient has been denied and there has been no updated decision.  MD spoke at length with psychiatrist and reports psychiatrist to assist in facilitating placement at either [facility] or [facility].  Intake/Charge at facilities unaware of any arrangements.  Psych CSW has contacted Shaune PollackLord, psychiatrist/NP and is awaiting a return call.  [name], LCSWA [phone]  Psychiatric & Orthopedics (5N 1-16) Clinical Social Worker

## 2014-10-03 NOTE — Clinical Social Work Psych Note (Signed)
Psych CSW reviewed patient's disposition with NP, Lord and Wellsite geologistMedical Director.  Psych CSW received a call from intake coordinator at [facility] requesting resubmission of referral (previously denied x2).  Psych CSW faxed this information and received confirmation of receipt.  [name], LCSWA [phone]  Psychiatric & Orthopedics (5N 1-16) Clinical Social Worker

## 2014-10-04 ENCOUNTER — Inpatient Hospital Stay: Payer: Self-pay | Admitting: Internal Medicine

## 2014-10-04 ENCOUNTER — Inpatient Hospital Stay: Payer: Self-pay | Admitting: Psychiatry

## 2014-10-04 LAB — CBC WITH DIFFERENTIAL/PLATELET
Basophil #: 0 10*3/uL (ref 0.0–0.1)
Basophil %: 0.3 %
Eosinophil #: 0 10*3/uL (ref 0.0–0.7)
Eosinophil %: 0.3 %
HCT: 35.2 % (ref 35.0–47.0)
HGB: 11.8 g/dL — AB (ref 12.0–16.0)
Lymphocyte #: 0.7 10*3/uL — ABNORMAL LOW (ref 1.0–3.6)
Lymphocyte %: 6 %
MCH: 31.3 pg (ref 26.0–34.0)
MCHC: 33.7 g/dL (ref 32.0–36.0)
MCV: 93 fL (ref 80–100)
Monocyte #: 0.7 x10 3/mm (ref 0.2–0.9)
Monocyte %: 6.2 %
NEUTROS ABS: 10.3 10*3/uL — AB (ref 1.4–6.5)
Neutrophil %: 87.2 %
Platelet: 163 10*3/uL (ref 150–440)
RBC: 3.78 10*6/uL — ABNORMAL LOW (ref 3.80–5.20)
RDW: 13.6 % (ref 11.5–14.5)
WBC: 11.8 10*3/uL — ABNORMAL HIGH (ref 3.6–11.0)

## 2014-10-04 LAB — COMPREHENSIVE METABOLIC PANEL
ANION GAP: 9 (ref 7–16)
AST: 28 U/L (ref 15–37)
Albumin: 3.3 g/dL — ABNORMAL LOW (ref 3.4–5.0)
Alkaline Phosphatase: 104 U/L
BUN: 38 mg/dL — AB (ref 7–18)
Bilirubin,Total: 0.4 mg/dL (ref 0.2–1.0)
Calcium, Total: 8.9 mg/dL (ref 8.5–10.1)
Chloride: 96 mmol/L — ABNORMAL LOW (ref 98–107)
Co2: 27 mmol/L (ref 21–32)
Creatinine: 8.14 mg/dL — ABNORMAL HIGH (ref 0.60–1.30)
EGFR (Non-African Amer.): 6 — ABNORMAL LOW
GFR CALC AF AMER: 7 — AB
Glucose: 282 mg/dL — ABNORMAL HIGH (ref 65–99)
Osmolality: 284 (ref 275–301)
Potassium: 4.5 mmol/L (ref 3.5–5.1)
SGPT (ALT): 25 U/L
Sodium: 132 mmol/L — ABNORMAL LOW (ref 136–145)
Total Protein: 7.8 g/dL (ref 6.4–8.2)

## 2014-10-04 NOTE — Progress Notes (Signed)
Patient discharged to [facility].  Transportation arrived at 2310.  Gave report to RN at [facility].  Patient facesheet faxed to [facility] per request.  Personal belongings on unit returned to patient, and patient retrieved valuables in security.

## 2014-10-04 NOTE — Care Management Note (Signed)
Late entry:  [facility] MANAGEMENT NOTE 10/04/2014  Patient:  [name],[name]   Account Number:  [id number]  Date Initiated:  09/28/2014  Documentation initiated by:  Eidan Muellner  Subjective/Objective Assessment:   CM following for progression and d/c planning.     Action/Plan:   Pt working iwth CSW re d/c plans at this time.   Anticipated DC Date:  10/02/2014   Anticipated DC Plan:  PSYCH[facility]AT[facility]IC [facility]OS[facility]         Choice offered to / List presented to:             Status of service:  Completed, signed off Medicare Important Message given?  YES (If response is "NO", the following Medicare IM given date fields will be blank) Date Medicare IM given:  09/28/2014 Medicare IM given by:  Myah Guynes Date Additional Medicare IM given:  10/02/2014 Additional Medicare IM given by:  [facility] Joao Mccurdy  Discharge Disposition:  PSYCHIATRIC HOSPITAL  Per UR Regulation:    If discussed at Long Length of Stay Meetings, dates discussed:    Comments:

## 2014-10-05 LAB — CBC WITH DIFFERENTIAL/PLATELET
Basophil #: 0 10*3/uL (ref 0.0–0.1)
Basophil #: 0 10*3/uL (ref 0.0–0.1)
Basophil %: 0.3 %
Basophil %: 0.6 %
EOS PCT: 0.5 %
Eosinophil #: 0 10*3/uL (ref 0.0–0.7)
Eosinophil #: 0 10*3/uL (ref 0.0–0.7)
Eosinophil %: 0.4 %
HCT: 35 % (ref 35.0–47.0)
HCT: 33.4 % — ABNORMAL LOW (ref 35.0–47.0)
HGB: 11.9 g/dL — AB (ref 12.0–16.0)
HGB: 11.2 g/dL — ABNORMAL LOW (ref 12.0–16.0)
LYMPHS ABS: 1.9 10*3/uL (ref 1.0–3.6)
Lymphocyte #: 1.2 10*3/uL (ref 1.0–3.6)
Lymphocyte %: 26.4 %
Lymphocyte %: 20.7 %
MCH: 31.8 pg (ref 26.0–34.0)
MCH: 31.3 pg (ref 26.0–34.0)
MCHC: 33.9 g/dL (ref 32.0–36.0)
MCHC: 33.6 g/dL (ref 32.0–36.0)
MCV: 94 fL (ref 80–100)
MCV: 93 fL (ref 80–100)
MONO ABS: 0.6 x10 3/mm (ref 0.2–0.9)
MONOS PCT: 10.2 %
Monocyte #: 0.4 x10 3/mm (ref 0.2–0.9)
Monocyte %: 6 %
NEUTROS PCT: 68 %
Neutrophil #: 4.7 10*3/uL (ref 1.4–6.5)
Neutrophil #: 3.9 10*3/uL (ref 1.4–6.5)
Neutrophil %: 66.9 %
Platelet: 170 10*3/uL (ref 150–440)
Platelet: 171 10*3/uL (ref 150–440)
RBC: 3.73 10*6/uL — ABNORMAL LOW (ref 3.80–5.20)
RBC: 3.58 10*6/uL — ABNORMAL LOW (ref 3.80–5.20)
RDW: 13.5 % (ref 11.5–14.5)
RDW: 13.8 % (ref 11.5–14.5)
WBC: 7 10*3/uL (ref 3.6–11.0)
WBC: 5.7 10*3/uL (ref 3.6–11.0)

## 2014-10-05 LAB — BASIC METABOLIC PANEL
Anion Gap: 7 (ref 7–16)
BUN: 48 mg/dL — ABNORMAL HIGH (ref 7–18)
CALCIUM: 8.9 mg/dL (ref 8.5–10.1)
CHLORIDE: 99 mmol/L (ref 98–107)
CO2: 25 mmol/L (ref 21–32)
CREATININE: 9.29 mg/dL — AB (ref 0.60–1.30)
EGFR (African American): 6 — ABNORMAL LOW
EGFR (Non-African Amer.): 5 — ABNORMAL LOW
Glucose: 159 mg/dL — ABNORMAL HIGH (ref 65–99)
Osmolality: 279 (ref 275–301)
POTASSIUM: 3.8 mmol/L (ref 3.5–5.1)
SODIUM: 131 mmol/L — AB (ref 136–145)

## 2014-10-05 LAB — RENAL FUNCTION PANEL
Albumin: 3 g/dL — ABNORMAL LOW (ref 3.4–5.0)
Anion Gap: 11 (ref 7–16)
BUN: 50 mg/dL — ABNORMAL HIGH (ref 7–18)
CREATININE: 9.84 mg/dL — AB (ref 0.60–1.30)
Calcium, Total: 8.3 mg/dL — ABNORMAL LOW (ref 8.5–10.1)
Chloride: 92 mmol/L — ABNORMAL LOW (ref 98–107)
Co2: 25 mmol/L (ref 21–32)
EGFR (African American): 6 — ABNORMAL LOW
EGFR (Non-African Amer.): 5 — ABNORMAL LOW
Glucose: 305 mg/dL — ABNORMAL HIGH (ref 65–99)
Osmolality: 282 (ref 275–301)
Phosphorus: 5.2 mg/dL — ABNORMAL HIGH (ref 2.5–4.9)
Potassium: 4.5 mmol/L (ref 3.5–5.1)
Sodium: 128 mmol/L — ABNORMAL LOW (ref 136–145)

## 2014-10-05 LAB — HEMOGLOBIN A1C: Hemoglobin A1C: 7.6 % — ABNORMAL HIGH (ref 4.2–6.3)

## 2014-10-06 LAB — BASIC METABOLIC PANEL
Anion Gap: 10 (ref 7–16)
BUN: 30 mg/dL — AB (ref 7–18)
CO2: 28 mmol/L (ref 21–32)
CREATININE: 6.87 mg/dL — AB (ref 0.60–1.30)
Calcium, Total: 8.4 mg/dL — ABNORMAL LOW (ref 8.5–10.1)
Chloride: 96 mmol/L — ABNORMAL LOW (ref 98–107)
EGFR (African American): 9 — ABNORMAL LOW
EGFR (Non-African Amer.): 7 — ABNORMAL LOW
Glucose: 235 mg/dL — ABNORMAL HIGH (ref 65–99)
Osmolality: 282 (ref 275–301)
POTASSIUM: 3.9 mmol/L (ref 3.5–5.1)
Sodium: 134 mmol/L — ABNORMAL LOW (ref 136–145)

## 2014-10-20 ENCOUNTER — Ambulatory Visit: Payer: 59 | Admitting: Cardiology

## 2014-10-24 ENCOUNTER — Other Ambulatory Visit: Payer: Self-pay

## 2014-10-24 ENCOUNTER — Emergency Department (HOSPITAL_COMMUNITY): Payer: 59

## 2014-10-24 ENCOUNTER — Encounter (HOSPITAL_COMMUNITY): Payer: Self-pay | Admitting: *Deleted

## 2014-10-24 ENCOUNTER — Inpatient Hospital Stay (HOSPITAL_COMMUNITY)
Admission: EM | Admit: 2014-10-24 | Discharge: 2014-10-29 | DRG: 637 | Disposition: A | Discharge status: Rehabilitation Facility | Payer: 59 | Attending: Family Medicine | Admitting: Family Medicine

## 2014-10-24 DIAGNOSIS — I10 Essential (primary) hypertension: Secondary | ICD-10-CM

## 2014-10-24 DIAGNOSIS — I160 Hypertensive urgency: Secondary | ICD-10-CM | POA: Insufficient documentation

## 2014-10-24 DIAGNOSIS — Z9841 Cataract extraction status, right eye: Secondary | ICD-10-CM | POA: Diagnosis not present

## 2014-10-24 DIAGNOSIS — N186 End stage renal disease: Secondary | ICD-10-CM | POA: Diagnosis present

## 2014-10-24 DIAGNOSIS — K5289 Other specified noninfective gastroenteritis and colitis: Secondary | ICD-10-CM | POA: Diagnosis present

## 2014-10-24 DIAGNOSIS — K5900 Constipation, unspecified: Secondary | ICD-10-CM | POA: Diagnosis present

## 2014-10-24 DIAGNOSIS — Z79899 Other long term (current) drug therapy: Secondary | ICD-10-CM | POA: Diagnosis not present

## 2014-10-24 DIAGNOSIS — R1030 Lower abdominal pain, unspecified: Secondary | ICD-10-CM

## 2014-10-24 DIAGNOSIS — E780 Pure hypercholesterolemia: Secondary | ICD-10-CM | POA: Diagnosis present

## 2014-10-24 DIAGNOSIS — G629 Polyneuropathy, unspecified: Secondary | ICD-10-CM | POA: Diagnosis present

## 2014-10-24 DIAGNOSIS — E860 Dehydration: Secondary | ICD-10-CM | POA: Diagnosis present

## 2014-10-24 DIAGNOSIS — E0810 Diabetes mellitus due to underlying condition with ketoacidosis without coma: Secondary | ICD-10-CM | POA: Diagnosis not present

## 2014-10-24 DIAGNOSIS — Z794 Long term (current) use of insulin: Secondary | ICD-10-CM

## 2014-10-24 DIAGNOSIS — E039 Hypothyroidism, unspecified: Secondary | ICD-10-CM | POA: Diagnosis present

## 2014-10-24 DIAGNOSIS — Z992 Dependence on renal dialysis: Secondary | ICD-10-CM

## 2014-10-24 DIAGNOSIS — R1013 Epigastric pain: Secondary | ICD-10-CM | POA: Diagnosis not present

## 2014-10-24 DIAGNOSIS — I120 Hypertensive chronic kidney disease with stage 5 chronic kidney disease or end stage renal disease: Secondary | ICD-10-CM | POA: Diagnosis present

## 2014-10-24 DIAGNOSIS — D649 Anemia, unspecified: Secondary | ICD-10-CM | POA: Diagnosis present

## 2014-10-24 DIAGNOSIS — E875 Hyperkalemia: Secondary | ICD-10-CM | POA: Diagnosis present

## 2014-10-24 DIAGNOSIS — E871 Hypo-osmolality and hyponatremia: Secondary | ICD-10-CM | POA: Diagnosis present

## 2014-10-24 DIAGNOSIS — E1310 Other specified diabetes mellitus with ketoacidosis without coma: Secondary | ICD-10-CM | POA: Diagnosis not present

## 2014-10-24 DIAGNOSIS — N2581 Secondary hyperparathyroidism of renal origin: Secondary | ICD-10-CM | POA: Diagnosis present

## 2014-10-24 DIAGNOSIS — R112 Nausea with vomiting, unspecified: Secondary | ICD-10-CM

## 2014-10-24 DIAGNOSIS — E1110 Type 2 diabetes mellitus with ketoacidosis without coma: Secondary | ICD-10-CM | POA: Diagnosis present

## 2014-10-24 DIAGNOSIS — E108 Type 1 diabetes mellitus with unspecified complications: Secondary | ICD-10-CM | POA: Diagnosis not present

## 2014-10-24 DIAGNOSIS — R739 Hyperglycemia, unspecified: Secondary | ICD-10-CM | POA: Insufficient documentation

## 2014-10-24 DIAGNOSIS — Z961 Presence of intraocular lens: Secondary | ICD-10-CM | POA: Diagnosis present

## 2014-10-24 DIAGNOSIS — E1010 Type 1 diabetes mellitus with ketoacidosis without coma: Secondary | ICD-10-CM | POA: Diagnosis present

## 2014-10-24 DIAGNOSIS — E1011 Type 1 diabetes mellitus with ketoacidosis with coma: Secondary | ICD-10-CM | POA: Diagnosis not present

## 2014-10-24 DIAGNOSIS — K760 Fatty (change of) liver, not elsewhere classified: Secondary | ICD-10-CM | POA: Diagnosis present

## 2014-10-24 DIAGNOSIS — D638 Anemia in other chronic diseases classified elsewhere: Secondary | ICD-10-CM | POA: Diagnosis present

## 2014-10-24 DIAGNOSIS — H540 Blindness, both eyes: Secondary | ICD-10-CM | POA: Diagnosis present

## 2014-10-24 DIAGNOSIS — E1043 Type 1 diabetes mellitus with diabetic autonomic (poly)neuropathy: Secondary | ICD-10-CM | POA: Diagnosis present

## 2014-10-24 DIAGNOSIS — E10319 Type 1 diabetes mellitus with unspecified diabetic retinopathy without macular edema: Secondary | ICD-10-CM | POA: Diagnosis present

## 2014-10-24 DIAGNOSIS — R109 Unspecified abdominal pain: Secondary | ICD-10-CM

## 2014-10-24 DIAGNOSIS — K5909 Other constipation: Secondary | ICD-10-CM

## 2014-10-24 DIAGNOSIS — R188 Other ascites: Secondary | ICD-10-CM | POA: Diagnosis present

## 2014-10-24 DIAGNOSIS — R1011 Right upper quadrant pain: Secondary | ICD-10-CM | POA: Diagnosis present

## 2014-10-24 DIAGNOSIS — R100 Acute abdomen: Secondary | ICD-10-CM | POA: Diagnosis not present

## 2014-10-24 LAB — BASIC METABOLIC PANEL
Anion gap: 29 — ABNORMAL HIGH (ref 5–15)
BUN: 65 mg/dL — ABNORMAL HIGH (ref 6–23)
BUN: 39 mg/dL — ABNORMAL HIGH (ref 6–23)
CALCIUM: 8.4 mg/dL (ref 8.4–10.5)
CHLORIDE: 78 meq/L — AB (ref 96–112)
CO2: <5 mmol/L — CL (ref 19–32)
CO2: 12 mmol/L — ABNORMAL LOW (ref 19–32)
CREATININE: 13.54 mg/dL — AB (ref 0.50–1.10)
Calcium: 8.2 mg/dL — ABNORMAL LOW (ref 8.4–10.5)
Chloride: 92 mEq/L — ABNORMAL LOW (ref 96–112)
Creatinine, Ser: 7.92 mg/dL — ABNORMAL HIGH (ref 0.50–1.10)
GFR calc Af Amer: 4 mL/min — ABNORMAL LOW (ref >90)
GFR calc non Af Amer: 3 mL/min — ABNORMAL LOW (ref >90)
GFR calc non Af Amer: 6 mL/min — ABNORMAL LOW (ref >90)
GFR, EST AFRICAN AMERICAN: 7 mL/min — AB (ref >90)
Glucose, Bld: 399 mg/dL — ABNORMAL HIGH (ref 70–99)
Glucose, Bld: 201 mg/dL — ABNORMAL HIGH (ref 70–99)
POTASSIUM: 3.1 mmol/L — AB (ref 3.5–5.1)
Potassium: 6.4 mmol/L (ref 3.5–5.1)
Sodium: 123 mmol/L — ABNORMAL LOW (ref 135–145)
Sodium: 133 mmol/L — ABNORMAL LOW (ref 135–145)

## 2014-10-24 LAB — COMPREHENSIVE METABOLIC PANEL
ALT: 22 U/L (ref 0–35)
ANION GAP: 33 — AB (ref 5–15)
AST: 24 U/L (ref 0–37)
Albumin: 4.4 g/dL (ref 3.5–5.2)
Alkaline Phosphatase: 100 U/L (ref 39–117)
BUN: 56 mg/dL — AB (ref 6–23)
CO2: 13 mmol/L — AB (ref 19–32)
CREATININE: 12.68 mg/dL — AB (ref 0.50–1.10)
Calcium: 9.2 mg/dL (ref 8.4–10.5)
Chloride: 77 mEq/L — ABNORMAL LOW (ref 96–112)
GFR calc Af Amer: 4 mL/min — ABNORMAL LOW (ref >90)
GFR calc non Af Amer: 3 mL/min — ABNORMAL LOW (ref >90)
Glucose, Bld: 299 mg/dL — ABNORMAL HIGH (ref 70–99)
Potassium: 4.7 mmol/L (ref 3.5–5.1)
Sodium: 123 mmol/L — ABNORMAL LOW (ref 135–145)
TOTAL PROTEIN: 8.6 g/dL — AB (ref 6.0–8.3)
Total Bilirubin: 2 mg/dL — ABNORMAL HIGH (ref 0.3–1.2)

## 2014-10-24 LAB — I-STAT VENOUS BLOOD GAS, ED
Acid-base deficit: 22 mmol/L — ABNORMAL HIGH (ref 0.0–2.0)
Bicarbonate: 4.2 mEq/L — ABNORMAL LOW (ref 20.0–24.0)
O2 Saturation: 92 %
TCO2: <5 mmol/L (ref 0–100)
pCO2, Ven: 12.1 mmHg — ABNORMAL LOW (ref 45.0–50.0)
pH, Ven: 7.143 — CL (ref 7.250–7.300)
pO2, Ven: 80 mmHg — ABNORMAL HIGH (ref 30.0–45.0)

## 2014-10-24 LAB — CBC WITH DIFFERENTIAL/PLATELET
BASOS PCT: 0 % (ref 0–1)
Basophils Absolute: 0 10*3/uL (ref 0.0–0.1)
EOS ABS: 0 10*3/uL (ref 0.0–0.7)
EOS PCT: 0 % (ref 0–5)
HCT: 36.2 % (ref 36.0–46.0)
Hemoglobin: 13.2 g/dL (ref 12.0–15.0)
LYMPHS PCT: 9 % — AB (ref 12–46)
Lymphs Abs: 0.7 10*3/uL (ref 0.7–4.0)
MCH: 31.1 pg (ref 26.0–34.0)
MCHC: 36.5 g/dL — AB (ref 30.0–36.0)
MCV: 85.4 fL (ref 78.0–100.0)
MONOS PCT: 3 % (ref 3–12)
Monocytes Absolute: 0.2 10*3/uL (ref 0.1–1.0)
NEUTROS PCT: 88 % — AB (ref 43–77)
Neutro Abs: 6.7 10*3/uL (ref 1.7–7.7)
PLATELETS: 228 10*3/uL (ref 150–400)
RBC: 4.24 MIL/uL (ref 3.87–5.11)
RDW: 12.8 % (ref 11.5–15.5)
WBC: 7.6 10*3/uL (ref 4.0–10.5)

## 2014-10-24 LAB — CBG MONITORING, ED
GLUCOSE-CAPILLARY: 451 mg/dL — AB (ref 70–99)
GLUCOSE-CAPILLARY: 244 mg/dL — AB (ref 70–99)

## 2014-10-24 LAB — URINALYSIS, ROUTINE W REFLEX MICROSCOPIC
Bilirubin Urine: NEGATIVE
Glucose, UA: 500 mg/dL — AB
Nitrite: NEGATIVE
Protein, ur: >300 mg/dL — AB
Specific Gravity, Urine: 1.016 (ref 1.005–1.030)
UROBILINOGEN UA: 0.2 mg/dL (ref 0.0–1.0)
pH: 5.5 (ref 5.0–8.0)

## 2014-10-24 LAB — URINE MICROSCOPIC-ADD ON

## 2014-10-24 LAB — PREGNANCY, URINE: Preg Test, Ur: NEGATIVE

## 2014-10-24 LAB — GLUCOSE, CAPILLARY
GLUCOSE-CAPILLARY: 137 mg/dL — AB (ref 70–99)
GLUCOSE-CAPILLARY: 126 mg/dL — AB (ref 70–99)
GLUCOSE-CAPILLARY: 170 mg/dL — AB (ref 70–99)

## 2014-10-24 LAB — KETONES, QUALITATIVE

## 2014-10-24 LAB — LIPASE, BLOOD: Lipase: 48 U/L (ref 11–59)

## 2014-10-24 MED ORDER — SENNA 8.6 MG PO TABS
1.0000 | ORAL_TABLET | Freq: Every day | ORAL | Status: DC
  Administered 2014-10-25 – 2014-10-26 (×3): 8.6 mg via ORAL
  Filled 2014-10-24 (×6): qty 1

## 2014-10-24 MED ORDER — LEVOTHYROXINE SODIUM 100 MCG PO TABS
100.0000 ug | ORAL_TABLET | Freq: Every day | ORAL | Status: DC
  Administered 2014-10-25 – 2014-10-29 (×5): 100 ug via ORAL
  Filled 2014-10-24 (×6): qty 1

## 2014-10-24 MED ORDER — SODIUM CHLORIDE 0.9 % IV SOLN
1000.0000 mL | INTRAVENOUS | Status: DC
  Administered 2014-10-24: 1000 mL via INTRAVENOUS

## 2014-10-24 MED ORDER — MORPHINE SULFATE 2 MG/ML IJ SOLN
4.0000 mg | INTRAMUSCULAR | Status: DC | PRN
  Administered 2014-10-25: 4 mg via INTRAVENOUS
  Filled 2014-10-24: qty 2

## 2014-10-24 MED ORDER — SODIUM CHLORIDE 0.9 % IJ SOLN
3.0000 mL | Freq: Two times a day (BID) | INTRAMUSCULAR | Status: DC
  Administered 2014-10-25 – 2014-10-29 (×8): 3 mL via INTRAVENOUS

## 2014-10-24 MED ORDER — SODIUM CHLORIDE 0.9 % IJ SOLN
3.0000 mL | Freq: Two times a day (BID) | INTRAMUSCULAR | Status: DC
  Administered 2014-10-25 – 2014-10-29 (×6): 3 mL via INTRAVENOUS

## 2014-10-24 MED ORDER — SODIUM CHLORIDE 0.9 % IV SOLN
INTRAVENOUS | Status: DC
  Administered 2014-10-24: 1.8 [IU]/h via INTRAVENOUS
  Administered 2014-10-24: 3.9 [IU]/h via INTRAVENOUS
  Filled 2014-10-24: qty 2.5

## 2014-10-24 MED ORDER — SODIUM CHLORIDE 0.9 % IV BOLUS (SEPSIS)
500.0000 mL | Freq: Once | INTRAVENOUS | Status: AC
  Administered 2014-10-24: 500 mL via INTRAVENOUS

## 2014-10-24 MED ORDER — INSULIN ASPART 100 UNIT/ML ~~LOC~~ SOLN
1.0000 [IU] | SUBCUTANEOUS | Status: DC
  Administered 2014-10-24 – 2014-10-25 (×3): 1 [IU] via SUBCUTANEOUS

## 2014-10-24 MED ORDER — AMLODIPINE BESYLATE 10 MG PO TABS
10.0000 mg | ORAL_TABLET | Freq: Every day | ORAL | Status: DC
  Administered 2014-10-24 – 2014-10-27 (×4): 10 mg via ORAL
  Filled 2014-10-24 (×6): qty 1

## 2014-10-24 MED ORDER — SODIUM CHLORIDE 0.9 % IV SOLN: 250.0000 mL | INTRAVENOUS | Status: DC | PRN

## 2014-10-24 MED ORDER — ONDANSETRON HCL 4 MG PO TABS: 4.0000 mg | ORAL_TABLET | Freq: Four times a day (QID) | ORAL | Status: DC | PRN

## 2014-10-24 MED ORDER — ONDANSETRON HCL 4 MG/2ML IJ SOLN
4.0000 mg | Freq: Four times a day (QID) | INTRAMUSCULAR | Status: DC | PRN
  Administered 2014-10-24 – 2014-10-29 (×2): 4 mg via INTRAVENOUS
  Filled 2014-10-24 (×3): qty 2

## 2014-10-24 MED ORDER — DOCUSATE SODIUM 100 MG PO CAPS
100.0000 mg | ORAL_CAPSULE | Freq: Two times a day (BID) | ORAL | Status: DC
  Administered 2014-10-24 – 2014-10-29 (×8): 100 mg via ORAL
  Filled 2014-10-24 (×10): qty 1

## 2014-10-24 MED ORDER — ACETAMINOPHEN 325 MG PO TABS
650.0000 mg | ORAL_TABLET | Freq: Four times a day (QID) | ORAL | Status: DC | PRN
  Administered 2014-10-25: 650 mg via ORAL
  Filled 2014-10-24: qty 2

## 2014-10-24 MED ORDER — DOXERCALCIFEROL 4 MCG/2ML IV SOLN
3.0000 ug | INTRAVENOUS | Status: DC
  Administered 2014-10-26 – 2014-10-28 (×2): 3 ug via INTRAVENOUS
  Filled 2014-10-24 (×2): qty 2

## 2014-10-24 MED ORDER — DARBEPOETIN ALFA 100 MCG/0.5ML IJ SOSY
100.0000 ug | PREFILLED_SYRINGE | INTRAMUSCULAR | Status: DC
  Administered 2014-10-26: 100 ug via INTRAVENOUS
  Filled 2014-10-24: qty 0.5

## 2014-10-24 MED ORDER — LISINOPRIL 20 MG PO TABS
20.0000 mg | ORAL_TABLET | Freq: Two times a day (BID) | ORAL | Status: DC
  Administered 2014-10-24 – 2014-10-27 (×6): 20 mg via ORAL
  Filled 2014-10-24 (×10): qty 1

## 2014-10-24 MED ORDER — SODIUM CHLORIDE 0.9 % IJ SOLN: 3.0000 mL | INTRAMUSCULAR | Status: DC | PRN

## 2014-10-24 MED ORDER — ACETAMINOPHEN 650 MG RE SUPP: 650.0000 mg | Freq: Four times a day (QID) | RECTAL | Status: DC | PRN

## 2014-10-24 MED ORDER — DEXTROSE-NACL 5-0.45 % IV SOLN: INTRAVENOUS | Status: DC

## 2014-10-24 MED ORDER — CARVEDILOL 25 MG PO TABS
25.0000 mg | ORAL_TABLET | Freq: Two times a day (BID) | ORAL | Status: DC
  Administered 2014-10-25 – 2014-10-27 (×5): 25 mg via ORAL
  Filled 2014-10-24 (×9): qty 1

## 2014-10-24 MED ORDER — INSULIN ASPART 100 UNIT/ML ~~LOC~~ SOLN: 1.0000 [IU] | Freq: Three times a day (TID) | SUBCUTANEOUS | Status: DC

## 2014-10-24 MED ORDER — HEPARIN SODIUM (PORCINE) 5000 UNIT/ML IJ SOLN
5000.0000 [IU] | Freq: Three times a day (TID) | INTRAMUSCULAR | Status: DC
  Administered 2014-10-24 – 2014-10-29 (×14): 5000 [IU] via SUBCUTANEOUS
  Filled 2014-10-24 (×16): qty 1

## 2014-10-24 MED ORDER — ONDANSETRON HCL 4 MG/2ML IJ SOLN
4.0000 mg | Freq: Once | INTRAMUSCULAR | Status: AC
  Administered 2014-10-24: 4 mg via INTRAVENOUS
  Filled 2014-10-24: qty 2

## 2014-10-24 NOTE — ED Provider Notes (Addendum)
CSN: [id number]     Arrival date & time 10/24/14  0848 History   First MD Initiated Contact with Patient 10/24/14 [phone]     Chief Complaint  Patient presents with  . Emesis     (Consider location/radiation/quality/duration/timing/severity/associated sxs/prior Treatment) HPI Comments: Patient with a history of hypertension, diabetes and end-stage renal disease on dialysis for about a year and a half presents with abdominal pain. Of note she is also blind in both eyes. She states she's had abdominal pain with vomiting since last night. She hasn't been able keep anything down. She has some pain around her umbilicus and her left abdomen. She states she hasn't had a bowel movement in 5 days. She denies any fevers or chills. She denies any urinary symptoms. There is no vaginal bleeding or discharge. She denies any past abdominal surgeries. She does dialysis Tuesday Thursday Saturday and did complete dialysis this past Saturday although she says she doesn't feel like she needs to be on dialysis. She doesn't feel like it is good for her body.  Patient is a 34 y.o. female presenting with vomiting.  Emesis Associated symptoms: no abdominal pain, no arthralgias, no chills, no diarrhea and no headaches     Past Medical History  Diagnosis Date  . 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 Hypothyroidism   . Hypertension   . Type I diabetes mellitus   . DVT (deep venous thrombosis)     "both arms" (11/11/2013)  . High cholesterol   . Diabetic retinopathy associated with type 1 diabetes mellitus   . Heart murmur     "slight" (11/11/2013)  . Graves' disease     "had thyroid radioactively treated" (11/11/2013)  . End-stage renal disease on hemodialysis     "TTS; [facility]" (11/11/2013)   Past Surgical History  Procedure Laterality Date  . Insertion of dialysis catheter N/A 04/01/2013    Procedure: INSERTION OF DIALYSIS CATHETER;  Surgeon: Pryor Ochoa, MD;  Location: [facility] OR;  Service:  Vascular;  Laterality: N/A;  . Av fistula placement Right 04/07/2013    Procedure: INSERTION OF ARTERIOVENOUS (AV) GORE-TEX GRAFT THIGH;  Surgeon: Chuck Hint, MD;  Location: [facility] OR;  Service: Vascular;  Laterality: Right;  . Cataract extraction w/ intraocular lens implant Right 2009  . Eye surgery Right 2009    "laser OR for diabetic retinopathy" (05/23/2013)  . Enucleation Left ~ 2010  . Revision of arteriovenous goretex graft Right 11/11/2013    Procedure: REVISION OF ARTERIOVENOUS GORETEX GRAFT - RIGHT THIGH;  Surgeon: Chuck Hint, MD;  Location: [facility] OR;  Service: Vascular;  Laterality: Right;  [name] Bilateral 04/04/2013    Procedure: VENOGRAM;  Surgeon: Chuck Hint, MD;  Location: [facility] CATH LAB;  Service: Cardiovascular;  Laterality: Bilateral;   Family History  Problem Relation Age of Onset  . Hypertension    . Cancer    . Thyroid disease    . Cancer Mother   . Asthma Father    History  Substance Use Topics  . Smoking status: Never Smoker   . Smokeless tobacco: Never Used  . Alcohol Use: No   OB History    No data available     Review of Systems  Constitutional: Negative for fever, chills, diaphoresis and fatigue.  HENT: Negative for congestion, rhinorrhea and sneezing.   Eyes: Negative.   Respiratory: Negative for cough, chest tightness and shortness of breath.   Cardiovascular: Negative for chest  pain and leg swelling.  Gastrointestinal: Positive for vomiting. Negative for nausea, abdominal pain, diarrhea and blood in stool.  Genitourinary: Negative for frequency, hematuria, flank pain and difficulty urinating.  Musculoskeletal: Negative for back pain and arthralgias.  Skin: Negative for rash.  Neurological: Negative for dizziness, speech difficulty, weakness, numbness and headaches.      Allergies  Review of patient's allergies indicates no known allergies.  Home Medications   Prior to Admission medications   Medication Sig Start  Date End Date Taking? Authorizing Provider  amLODipine (NORVASC) 10 MG tablet Take 1 tablet (10 mg total) by mouth at bedtime. Patient taking differently: Take 10 mg by mouth 2 (two) times daily.  07/25/14  Yes Tyrone Nineyan B Grunz, MD  carvedilol (COREG) 25 MG tablet Take 1 tablet (25 mg total) by mouth 2 (two) times daily with a meal. Patient taking differently: Take 25 mg by mouth daily.  08/03/14  Yes Tyrone Nineyan B Grunz, MD  insulin aspart (NOVOLOG) 100 UNIT/ML injection Inject 1-3 Units into the skin once. sliding scale   Yes Historical Provider, MD  levothyroxine (SYNTHROID, LEVOTHROID) 100 MCG tablet Take 1 tablet by mouth  daily 09/11/14  Yes Tyrone Nineyan B Grunz, MD  lisinopril (PRINIVIL,ZESTRIL) 20 MG tablet Take 20 mg by mouth 2 (two) times daily.   Yes Historical Provider, MD  aspirin 81 MG chewable tablet Chew 1 tablet (81 mg total) by mouth daily. Patient not taking: Reported on 10/24/2014 06/30/14   Lora Havensaleigh N Rumley, DO  gabapentin (NEURONTIN) 300 MG capsule Take 1 capsule (300 mg total) by mouth daily. Patient not taking: Reported on 10/24/2014 09/27/14   Raliegh IpAshly M Gottschalk, DO  Insulin Glargine (LANTUS SOLOSTAR) 100 UNIT/ML Solostar Pen Inject 14 Units into the skin 2 (two) times daily. Patient not taking: Reported on 10/24/2014 08/03/14   Tyrone Nineyan B Grunz, MD  isosorbide mononitrate (IMDUR) 30 MG 24 hr tablet Take 1 tablet (30 mg total) by mouth daily. Patient not taking: Reported on 10/24/2014 06/28/14   Glori LuisEric G Sonnenberg, MD  multivitamin (RENA-VIT) TABS tablet Take 1 tablet by mouth at bedtime. Patient not taking: Reported on 10/24/2014 09/27/14   Ashly M Gottschalk, DO  pantoprazole (PROTONIX) 40 MG tablet Take 1 tablet (40 mg total) by mouth daily. Patient not taking: Reported on 10/24/2014 06/30/14   [place] N Rumley, DO   BP 196/82 mmHg  Pulse 99  Temp(Src) 98.2 F (36.8 C) (Oral)  Resp 21  SpO2 100%  LMP 10/16/2014 Physical Exam  ED Course  Procedures (including critical care time) Labs  Review Labs Reviewed  COMPREHENSIVE METABOLIC PANEL - Abnormal; Notable for the following:    Sodium 123 (*)    Chloride 77 (*)    CO2 13 (*)    Glucose, Bld 299 (*)    BUN 56 (*)    Creatinine, Ser 12.68 (*)    Total Protein 8.6 (*)    Total Bilirubin 2.0 (*)    GFR calc non Af Amer 3 (*)    GFR calc Af Amer 4 (*)    Anion gap 33 (*)    All other components within normal limits  CBC WITH DIFFERENTIAL - Abnormal; Notable for the following:    MCHC 36.5 (*)    Neutrophils Relative % 88 (*)    Lymphocytes Relative 9 (*)    All other components within normal limits  URINALYSIS, ROUTINE W REFLEX MICROSCOPIC - Abnormal; Notable for the following:    Glucose, UA 500 (*)    Hgb  urine dipstick LARGE (*)    Ketones, ur >80 (*)    Protein, ur >300 (*)    Leukocytes, UA TRACE (*)    All other components within normal limits  URINE MICROSCOPIC-ADD ON - Abnormal; Notable for the following:    Squamous Epithelial / LPF FEW (*)    All other components within normal limits  PREGNANCY, URINE  LIPASE, BLOOD  BASIC METABOLIC PANEL  KETONES, QUALITATIVE  BLOOD GAS, VENOUS    Imaging Review Dg Abd Acute W/chest  10/24/2014   CLINICAL DATA:  Emesis and abdominal pain  EXAM: ACUTE ABDOMEN SERIES (ABDOMEN 2 VIEW & CHEST 1 VIEW)  COMPARISON:  Chest 06/28/2014  FINDINGS: HEART SIZE AND VASCULARITY NORMAL.  LUNGS ARE CLEAR.  NORMAL BOWEL GAS PATTERN. NO BOWEL OBSTRUCTION OR FREE AIR. NO ILEUS. BONY STRUCTURES ARE INTACT. QUESTION 2 MM LEFT RENAL CALCULUS.  IMPRESSION: NO ACTIVE CARDIOPULMONARY DISEASE  NORMAL BOWEL GAS PATTERN.  POSSIBLE SMALL LEFT RENAL CALCULUS.   Electronically Signed   By: Marlan Palau M.D.   On: 10/24/2014 10:43   Ct Renal Stone Study  10/24/2014   CLINICAL DATA:  Vomiting. Left flank pain. Diabetes and renal failure. On dialysis.  EXAM: CT ABDOMEN AND PELVIS WITHOUT CONTRAST  TECHNIQUE: Multidetector CT imaging of the abdomen and pelvis was performed following the standard  protocol without IV contrast.  COMPARISON:  10/24/2014  FINDINGS: Lung bases are clear. No infiltrate or effusion. Heart size normal. No pericardial effusion.  Liver is diffusely low density suggesting fatty infiltration. No focal liver lesion. Spleen is not enlarged. Gallbladder is collapsed and not thickened. Negative pancreas.  Atrophic kidneys without obstruction or stone. No renal mass. Partially distended urinary bladder is normal.  Negative for bowel obstruction or bowel thickening.  Small amount of ascites. Mild subcutaneous edema diffusely secondary to fluid overload.  No skeletal findings.  IMPRESSION: Findings consistent with fluid overload with mild amount of ascites and mild anasarca.  Fatty infiltration of liver.   Electronically Signed   By: Marlan Palau M.D.   On: 10/24/2014 13:16     EKG Interpretation None      MDM   Final diagnoses:  Nausea and vomiting, vomiting of unspecified type  Other constipation  Hyponatremia    Patient has a history of end-stage renal disease and diabetes presents with vomiting and constipation. She missed her dialysis today. She's had some ongoing vomiting in the ED. She has marked hyponatremia as well as an elevated creatinine but her potassium is normal. She has a positive anion gap metabolic acidosis which is likely related to her renal failure and possible dehydration as well as some DKA component. However she does have some evidence of fluid overload and anasarca. Her respiratory function is normal with no hypoxia or increased work of breathing. She has some mild tachycardia. She was given some fluids cautiously. She was started on glucostabilizer. She does not need emergent dialysis today. I'll consult the family medicine service for admission.  I also spoke with Dr. Arrie Aran with [facility] and let him know that the pt is being admitted.  CRITICAL CARE Performed by: Aarti Mankowski Total critical care time: 45 Critical care time was  exclusive of separately billable procedures and treating other patients. Critical care was necessary to treat or prevent imminent or life-threatening deterioration. Critical care was time spent personally by me on the following activities: development of treatment plan with patient and/or surrogate as well as nursing, discussions with consultants, evaluation of patient's  response to treatment, examination of patient, obtaining history from patient or surrogate, ordering and performing treatments and interventions, ordering and review of laboratory studies, ordering and review of radiographic studies, pulse oximetry and re-evaluation of patient's condition.    Rolan Bucco, MD 10/24/14 1610  Rolan Bucco, MD 10/24/14 1630  Rolan Bucco, MD 10/24/14 1640

## 2014-10-24 NOTE — Progress Notes (Signed)
Pt admitted to 2C13. Pt is currently off the insulin gtt. Pt is alert and complaining of stomach pain. Pt stated that she lives at home with a friend and has to trouble getting to her dialysis. She has no skin issues and has a right upper thigh fistula. Pt stated that she was just discharged from 6E because she was refusing dialysis, she does not think that it is helping.

## 2014-10-24 NOTE — H&P (Signed)
Family Medicine Teaching [facility] Admission History and Physical Service Pager: [phone]  Patient name: Susan Rossettiameka Balzarini Medical record number: [id number] Date of birth: 12/01/1980 Age: 34 y.o. Gender: female  Primary Care Provider: Hazeline JunkerGrunz, Ryan, MD Consultants: Nephrology Code Status: Full  Chief Complaint: DKA  Assessment and Plan: Susan Rossettiameka Shirah is a 34 y.o. female presenting with DKA secondary to vomiting. PMH is significant for type 1 diabetes mellitus, diabetic retinopathy causing blindness, ESRD, HTN, hypothyroidism, peripheral polyneuropathy, and h/o DVT of UE.  # Hyperglycemia- Home medications of diabetes include Novolog 1-3U daily, and Lantus 14U BID. Reports not taking medications for last few days. Glucose 299-451 in ED. Brittle diabetic with rapid changes in blood glucose. - [facility] in SDU. En[place]ola attending. - VBG: pH 7.143, pCO2 12.1, pO2 80, Bicarb 4.2 - Potassium 6.4, Sodium 123, Chloride 78 - Anion Gap 33>>40  - Anion Gap not reliable in ESRD patient - UA- glucose, ketones, protein, large hemoglobin - Moderate acetone - CXR- no acute cardiopulmonary disease, renal calculus right kidney - CT renal w/o - fluid overload with mild ascites and mild anasarca, fatty infiltration of liver - Follow up post-dialysis BMP - Follow up am labs--BMP, CBC - Stop insulin drip and fluids, start custom SSI 1-3u q2 hours - Follow CBGs and electrolytes  # Abdominal Pain: No bowel movement in 5 days. Diffuse pain that started in lower abdomen and then spread. No fevers noted. CT renal w/o contrast negative. UA not consistent with UTI. Concern for appy vs. obstruction but no fever and obstruction should have been evident on noncontrast CT. - WBC 7.6 - Acetaminophen PRN pain and fever - Morphine 4mg  q4hr PRN pain - Zofran PRN nausea - Initiate bowel regimen, colace/senna  # ESRD- Receives dialysis on Tuesday, Thursday, Saturday. Was unable to  receive dialysis prior to admission today. - Receiving dialysis today - Creatinine 13.54 - Electrolytes abnormalities listed above - Nephrology consulted. Appreciate recommendations.  - Hemodialysis today  # Hypothyroidism - Continue home medication of Levothyroxine  # HTN:  - Home medications include Amlodipine 10mg , Coreg 25mg  BID, and Lisinopril.  - BP 207/100 in ED. Improved to 163/89 in dialysis. - Continue Amlodipine, Coreg, and Lisinopril   # Suicidal Ideation- Noted at last hospitalization.  - Continue to monitor throughout hospitalization  FEN/GI: Carb Modified diet Prophylaxis: Subcutaneous Heparin  [facility]: [facility] Placed in SDU. Eniola attending.  History of Present Illness: [name] is a 34 y.o. female presenting with nausea, vomiting, and abdominal pain and noted to be hyperglycemic. States blood sugar has been normal for the last few days with sugars in 80s, so she has not taken her insulin. States she is a brittle diabetic with rapid changes in blood glucose. Has not had a bowel movement in 5 days. Abdominal pain started 3-4 days ago. Pain began in lower abdomen, but then spread diffusely. Vomiting began this morning. Denies fevers, but did admit to chills in dialysis. Denies problems with urination.  Normally goes to dialysis on Tuesday, Thursdays, and Saturday, but did not go to dialysis today. Given fluids in ED and insulin drip initiated.   Review Of Systems: Per HPI  Otherwise 12 point review of systems was performed and was unremarkable.  Patient Active Problem List   Diagnosis Date Noted  . DKA (diabetic ketoacidoses) 10/24/2014  . Major depressive disorder, recurrent, severe without psychotic features   . ESRD (end stage renal disease) 09/27/2014  . Hip  pain   . Recurrent major depression-severe 09/26/2014  . Suicidal behavior 09/25/2014  . Suicidal ideation 09/25/2014  . Peripheral polyneuropathy 09/19/2014   . HTN (hypertension), malignant 06/29/2014  . Abnormal EKG 06/29/2014  . Chest pain with moderate risk of acute coronary syndrome 06/28/2014  . Atypical chest pain 06/28/2014  . ESRD (end stage renal disease) on dialysis 11/11/2013  . Hypothyroidism 06/10/2013  . Diabetic retinopathy-Blind 04/21/2013  . Essential hypertension, benign 04/21/2013  . DVT of upper extremity (deep vein thrombosis) 04/02/2013  . DM (diabetes mellitus), type 1 04/02/2013   Past Medical History: Past Medical History  Diagnosis Date  . 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 Hypothyroidism   . Hypertension   . Type I diabetes mellitus   . DVT (deep venous thrombosis)     "both arms" (11/11/2013)  . High cholesterol   . Diabetic retinopathy associated with type 1 diabetes mellitus   . Heart murmur     "slight" (11/11/2013)  . Graves' disease     "had thyroid radioactively treated" (11/11/2013)  . End-stage renal disease on hemodialysis     "TTS; [facility]" (11/11/2013)   Past Surgical History: Past Surgical History  Procedure Laterality Date  . Insertion of dialysis catheter N/A 04/01/2013    Procedure: INSERTION OF DIALYSIS CATHETER;  Surgeon: Pryor Ochoa, MD;  Location: [facility] OR;  Service: Vascular;  Laterality: N/A;  . Av fistula placement Right 04/07/2013    Procedure: INSERTION OF ARTERIOVENOUS (AV) GORE-TEX GRAFT THIGH;  Surgeon: Chuck Hint, MD;  Location: [facility] OR;  Service: Vascular;  Laterality: Right;  . Cataract extraction w/ intraocular lens implant Right 2009  . Eye surgery Right 2009    "laser OR for diabetic retinopathy" (05/23/2013)  . Enucleation Left ~ 2010  . Revision of arteriovenous goretex graft Right 11/11/2013    Procedure: REVISION OF ARTERIOVENOUS GORETEX GRAFT - RIGHT THIGH;  Surgeon: Chuck Hint, MD;  Location: [facility] OR;  Service: Vascular;  Laterality: Right;  [name] Bilateral 04/04/2013    Procedure: VENOGRAM;  Surgeon: Chuck Hint,  MD;  Location: [facility] CATH LAB;  Service: Cardiovascular;  Laterality: Bilateral;   Social History: History  Substance Use Topics  . Smoking status: Never Smoker   . Smokeless tobacco: Never Used  . Alcohol Use: No   Please also refer to relevant sections of EMR.  Family History: Family History  Problem Relation Age of Onset  . Hypertension    . Cancer    . Thyroid disease    . Cancer Mother   . Asthma Father    Allergies and Medications: No Known Allergies No current facility-administered medications on file prior to encounter.   Current Outpatient Prescriptions on File Prior to Encounter  Medication Sig Dispense Refill  . amLODipine (NORVASC) 10 MG tablet Take 1 tablet (10 mg total) by mouth at bedtime. (Patient taking differently: Take 10 mg by mouth 2 (two) times daily. ) 90 tablet 3  . carvedilol (COREG) 25 MG tablet Take 1 tablet (25 mg total) by mouth 2 (two) times daily with a meal. (Patient taking differently: Take 25 mg by mouth daily. ) 180 tablet 3  . insulin aspart (NOVOLOG) 100 UNIT/ML injection Inject 1-3 Units into the skin once. sliding scale    . levothyroxine (SYNTHROID, LEVOTHROID) 100 MCG tablet Take 1 tablet by mouth  daily 90 tablet 3  . lisinopril (PRINIVIL,ZESTRIL) 20 MG tablet Take 20 mg by mouth  2 (two) times daily.    Marland Kitchen aspirin 81 MG chewable tablet Chew 1 tablet (81 mg total) by mouth daily. (Patient not taking: Reported on 10/24/2014) 30 tablet 0  . gabapentin (NEURONTIN) 300 MG capsule Take 1 capsule (300 mg total) by mouth daily. (Patient not taking: Reported on 10/24/2014) 30 capsule 3  . Insulin Glargine (LANTUS SOLOSTAR) 100 UNIT/ML Solostar Pen Inject 14 Units into the skin 2 (two) times daily. (Patient not taking: Reported on 10/24/2014) 5 pen PRN  . isosorbide mononitrate (IMDUR) 30 MG 24 hr tablet Take 1 tablet (30 mg total) by mouth daily. (Patient not taking: Reported on 10/24/2014) 30 tablet 6  . multivitamin (RENA-VIT) TABS tablet Take 1 tablet by  mouth at bedtime. (Patient not taking: Reported on 10/24/2014) 30 tablet 1  . pantoprazole (PROTONIX) 40 MG tablet Take 1 tablet (40 mg total) by mouth daily. (Patient not taking: Reported on 10/24/2014) 30 tablet 0    Objective: BP 158/86 mmHg  Pulse 98  Temp(Src) 98.2 F (36.8 C) (Oral)  Resp 16  SpO2 99%  LMP 10/16/2014 Exam: General: 33yo female in dialysis in moderate distress due to abdominal pain HEENT: Moist mucous membranes. Normocephalic.  Cardiovascular: S1 and S2 noted. Murmur noted. Regular rate. Respiratory: Clear to auscultation bilaterally. No wheezes/rales/rhonchi. Tachypneic Abdomen: Hypoactive bowel sounds. Diffuse tenderness. No rebound tenderness noted.  Extremities: No edema noted. Pulses palpated. Skin: No rashes noted. Warm. Neuro: No focal deficits noted. AAO x3.  Labs and Imaging: CBC BMET   Recent Labs Lab 10/24/14 0945  WBC 7.6  HGB 13.2  HCT 36.2  PLT 228    Recent Labs Lab 10/24/14 1838  NA 133*  K 3.1*  CL 92*  CO2 12*  BUN 39*  CREATININE 7.92*  GLUCOSE 201*  CALCIUM 8.4     ABG    Component Value Date/Time   HCO3 4.2* 10/24/2014 1623   TCO2 <5 10/24/2014 1623   ACIDBASEDEF 22.0* 10/24/2014 1623   O2SAT 92.0 10/24/2014 1623  - pH 7.143 - AG 33>40 - Pregnancy negative  Dg Hip Complete Right  09/27/2014   CLINICAL DATA:  Right hip pain for 4 to 5 days.  No known injury.  EXAM: RIGHT HIP - COMPLETE 2+ VIEW  COMPARISON:  None.  FINDINGS: No fracture or dislocation. Right hip joint spaces appear preserved. No evidence of avascular necrosis.  Limited visualization of the pelvis and contralateral left hip is normal.  There is ingested radiopaque debris within the descending colon. Regional soft tissues appear otherwise normal.  IMPRESSION: No explanation for patient's right hip pain.   Electronically Signed   By: Simonne Come M.D.   On: 09/27/2014 16:28   Dg Abd Acute W/chest  10/24/2014   CLINICAL DATA:  Emesis and abdominal pain   EXAM: ACUTE ABDOMEN SERIES (ABDOMEN 2 VIEW & CHEST 1 VIEW)  COMPARISON:  Chest 06/28/2014  FINDINGS: HEART SIZE AND VASCULARITY NORMAL.  LUNGS ARE CLEAR.  NORMAL BOWEL GAS PATTERN. NO BOWEL OBSTRUCTION OR FREE AIR. NO ILEUS. BONY STRUCTURES ARE INTACT. QUESTION 2 MM LEFT RENAL CALCULUS.  IMPRESSION: NO ACTIVE CARDIOPULMONARY DISEASE  NORMAL BOWEL GAS PATTERN.  POSSIBLE SMALL LEFT RENAL CALCULUS.   Electronically Signed   By: Marlan Palau M.D.   On: 10/24/2014 10:43   Ct Renal Stone Study  10/24/2014   CLINICAL DATA:  Vomiting. Left flank pain. Diabetes and renal failure. On dialysis.  EXAM: CT ABDOMEN AND PELVIS WITHOUT CONTRAST  TECHNIQUE: Multidetector CT imaging of the  abdomen and pelvis was performed following the standard protocol without IV contrast.  COMPARISON:  10/24/2014  FINDINGS: Lung bases are clear. No infiltrate or effusion. Heart size normal. No pericardial effusion.  Liver is diffusely low density suggesting fatty infiltration. No focal liver lesion. Spleen is not enlarged. Gallbladder is collapsed and not thickened. Negative pancreas.  Atrophic kidneys without obstruction or stone. No renal mass. Partially distended urinary bladder is normal.  Negative for bowel obstruction or bowel thickening.  Small amount of ascites. Mild subcutaneous edema diffusely secondary to fluid overload.  No skeletal findings.  IMPRESSION: Findings consistent with fluid overload with mild amount of ascites and mild anasarca.  Fatty infiltration of liver.   Electronically Signed   By: Marlan Palau M.D.   On: 10/24/2014 13:16   Vici, DO 10/24/2014, 8:48 PM PGY-1, [place] [facility] FPTS Intern pager: [phone], text pages welcome  FPTS Upper-Level Resident Addendum  I have independently interviewed and examined the patient. I have discussed the above with the original author and agree with their documentation. My edits for correction/addition/clarification are in pink. Please see also any  attending notes.   Abram Sander, MD PGY-2, [facility] Family [facility] FPTS Service pager: [phone] (text pages welcome through [facility])

## 2014-10-24 NOTE — ED Notes (Signed)
Called CT Patient next for CT Renal Study.

## 2014-10-24 NOTE — ED Notes (Signed)
CRITICAL VALUE ALERT  Critical value received:  Potassium 6.4, CO2 <5  Date of notification:  10/24/2014  Time of notification:  1655  Critical value read back: Yes  Nurse who received alert:  Apolonio SchneidersHannah Steele RN  MD notified (1st page):  Admitting Physician   Time of first page:  1655  MD notified (2nd page):  Time of second page:  Responding MD:    Time MD responded:

## 2014-10-24 NOTE — ED Notes (Signed)
MD Belfi at the bedside  

## 2014-10-24 NOTE — ED Notes (Signed)
Nephrology PA stated that patient needed to have immediate dialysis. Hemodialysis would call when ready for patient.

## 2014-10-24 NOTE — ED Notes (Signed)
Admitting Family Medicine at the bedside.

## 2014-10-24 NOTE — Consult Note (Signed)
[facility] Renal Consultation Note  Indication for Consultation:  Management of ESRD/hemodialysis; anemia, hypertension/volume and secondary hyperparathyroidism  HPI: [name] is a 34 y.o. female admitted with Hyperkalemia (k6.4) Co2 <5 , ^ Blood sugar 399 , hypoxia, and  missed HD today ( [facility]  TTS, Did attend Sat, missed 49min) .She reports abdominal pain with N/V . Very anxious in ER and not able to give me much further History. No problems reported from out pt kidney center last tx and noted she runs on Low K bath and continues to have ^ k as op with normal kinetics. Xray in Er =No cardiopulmonary acute dz with  lungs clear and renal calculus R kidney. Noted HO depression with recent Dec 2015 admit [facility] for depression ;thought about stopping hd but has attended outpt hd since 09/2014 [facility] for depression and she had singed out of hospital .      Past Medical History  Diagnosis Date  . 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 Hypothyroidism   . Hypertension   . Type I diabetes mellitus   . DVT (deep venous thrombosis)     "both arms" (11/11/2013)  . High cholesterol   . Diabetic retinopathy associated with type 1 diabetes mellitus   . Heart murmur     "slight" (11/11/2013)  . Graves' disease     "had thyroid radioactively treated" (11/11/2013)  . End-stage renal disease on hemodialysis     "TTS; [place]" (11/11/2013)    Past Surgical History  Procedure Laterality Date  . Insertion of dialysis catheter N/A 04/01/2013    Procedure: INSERTION OF DIALYSIS CATHETER;  Surgeon: Mal Misty, MD;  Location: [facility];  Service: Vascular;  Laterality: N/A;  . Av fistula placement Right 04/07/2013    Procedure: INSERTION OF ARTERIOVENOUS (AV) GORE-TEX GRAFT THIGH;  Surgeon: Angelia Mould, MD;  Location: [facility] OR;  Service: Vascular;  Laterality: Right;  . Cataract extraction w/ intraocular lens implant Right 2009  . Eye surgery Right  2009    "laser OR for diabetic retinopathy" (05/23/2013)  . Enucleation Left ~ 2010  . Revision of arteriovenous goretex graft Right 11/11/2013    Procedure: REVISION OF ARTERIOVENOUS GORETEX GRAFT - RIGHT THIGH;  Surgeon: Angelia Mould, MD;  Location: [place];  Service: Vascular;  Laterality: Right;  Annell Greening Bilateral 04/04/2013    Procedure: VENOGRAM;  Surgeon: Angelia Mould, MD;  Location: [facility] CATH LAB;  Service: Cardiovascular;  Laterality: Bilateral;      Family History  Problem Relation Age of Onset  . Hypertension    . Cancer    . Thyroid disease    . Cancer Mother   . Asthma Father       reports that she has never smoked. She has never used smokeless tobacco. She reports that she does not drink alcohol or use illicit drugs.  No Known Allergies  Prior to Admission medications   Medication Sig Start Date End Date Taking? Authorizing Provider  amLODipine (NORVASC) 10 MG tablet Take 1 tablet (10 mg total) by mouth at bedtime. Patient taking differently: Take 10 mg by mouth 2 (two) times daily.  07/25/14  Yes Patrecia Pour, MD  carvedilol (COREG) 25 MG tablet Take 1 tablet (25 mg total) by mouth 2 (two) times daily with a meal. Patient taking differently: Take 25 mg by mouth daily.  08/03/14  Yes Patrecia Pour, MD  insulin aspart (  NOVOLOG) 100 UNIT/ML injection Inject 1-3 Units into the skin once. sliding scale   Yes Historical Provider, MD  levothyroxine (SYNTHROID, LEVOTHROID) 100 MCG tablet Take 1 tablet by mouth  daily 09/11/14  Yes Patrecia Pour, MD  lisinopril (PRINIVIL,ZESTRIL) 20 MG tablet Take 20 mg by mouth 2 (two) times daily.   Yes Historical Provider, MD  aspirin 81 MG chewable tablet Chew 1 tablet (81 mg total) by mouth daily. Patient not taking: Reported on 10/24/2014 06/30/14   Burna Cash Rumley, DO  gabapentin (NEURONTIN) 300 MG capsule Take 1 capsule (300 mg total) by mouth daily. Patient not taking: Reported on 10/24/2014 09/27/14   Janora Norlander, DO   Insulin Glargine (LANTUS SOLOSTAR) 100 UNIT/ML Solostar Pen Inject 14 Units into the skin 2 (two) times daily. Patient not taking: Reported on 10/24/2014 08/03/14   Patrecia Pour, MD  isosorbide mononitrate (IMDUR) 30 MG 24 hr tablet Take 1 tablet (30 mg total) by mouth daily. Patient not taking: Reported on 10/24/2014 06/28/14   Leone Haven, MD  multivitamin (RENA-VIT) TABS tablet Take 1 tablet by mouth at bedtime. Patient not taking: Reported on 10/24/2014 09/27/14   Ashly M Gottschalk, DO  pantoprazole (PROTONIX) 40 MG tablet Take 1 tablet (40 mg total) by mouth daily. Patient not taking: Reported on 10/24/2014 06/30/14   Lorna Few, DO     Anti-infectives    None      Results for orders placed or performed during the hospital encounter of 10/24/14 (from the past 48 hour(s))  Comprehensive metabolic panel     Status: Abnormal   Collection Time: 10/24/14  9:45 AM  Result Value Ref Range   Sodium 123 (L) 135 - 145 mmol/L    Comment: Please note change in reference range.   Potassium 4.7 3.5 - 5.1 mmol/L    Comment: Please note change in reference range.   Chloride 77 (L) 96 - 112 mEq/L   CO2 13 (L) 19 - 32 mmol/L   Glucose, Bld 299 (H) 70 - 99 mg/dL   BUN 56 (H) 6 - 23 mg/dL   Creatinine, Ser 12.68 (H) 0.50 - 1.10 mg/dL   Calcium 9.2 8.4 - 10.5 mg/dL   Total Protein 8.6 (H) 6.0 - 8.3 g/dL   Albumin 4.4 3.5 - 5.2 g/dL   AST 24 0 - 37 U/L   ALT 22 0 - 35 U/L   Alkaline Phosphatase 100 39 - 117 U/L   Total Bilirubin 2.0 (H) 0.3 - 1.2 mg/dL   GFR calc non Af Amer 3 (L) >90 mL/min   GFR calc Af Amer 4 (L) >90 mL/min    Comment: (NOTE) The eGFR has been calculated using the CKD EPI equation. This calculation has not been validated in all clinical situations. eGFR's persistently <90 mL/min signify possible Chronic Kidney Disease.    Anion gap 33 (H) 5 - 15  CBC with Differential     Status: Abnormal   Collection Time: 10/24/14  9:45 AM  Result Value Ref Range   WBC 7.6  4.0 - 10.5 K/uL   RBC 4.24 3.87 - 5.11 MIL/uL   Hemoglobin 13.2 12.0 - 15.0 g/dL   HCT 36.2 36.0 - 46.0 %   MCV 85.4 78.0 - 100.0 fL   MCH 31.1 26.0 - 34.0 pg   MCHC 36.5 (H) 30.0 - 36.0 g/dL   RDW 12.8 11.5 - 15.5 %   Platelets 228 150 - 400 K/uL   Neutrophils Relative %  88 (H) 43 - 77 %   Neutro Abs 6.7 1.7 - 7.7 K/uL   Lymphocytes Relative 9 (L) 12 - 46 %   Lymphs Abs 0.7 0.7 - 4.0 K/uL   Monocytes Relative 3 3 - 12 %   Monocytes Absolute 0.2 0.1 - 1.0 K/uL   Eosinophils Relative 0 0 - 5 %   Eosinophils Absolute 0.0 0.0 - 0.7 K/uL   Basophils Relative 0 0 - 1 %   Basophils Absolute 0.0 0.0 - 0.1 K/uL  Lipase, blood     Status: None   Collection Time: 10/24/14  9:45 AM  Result Value Ref Range   Lipase 48 11 - 59 U/L  Urinalysis, Routine w reflex microscopic     Status: Abnormal   Collection Time: 10/24/14 10:13 AM  Result Value Ref Range   Color, Urine PINK YELLOW   APPearance CLEAR CLEAR   Specific Gravity, Urine 1.016 1.005 - 1.030   pH 5.5 5.0 - 8.0   Glucose, UA 500 (A) NEGATIVE mg/dL   Hgb urine dipstick LARGE (A) NEGATIVE   Bilirubin Urine NEGATIVE NEGATIVE   Ketones, ur >80 (A) NEGATIVE mg/dL   Protein, ur >300 (A) NEGATIVE mg/dL   Urobilinogen, UA 0.2 0.0 - 1.0 mg/dL   Nitrite NEGATIVE NEGATIVE   Leukocytes, UA TRACE (A) NEGATIVE  Pregnancy, urine     Status: None   Collection Time: 10/24/14 10:13 AM  Result Value Ref Range   Preg Test, Ur NEGATIVE NEGATIVE    Comment:        THE SENSITIVITY OF THIS METHODOLOGY IS >20 mIU/mL.   Urine microscopic-add on     Status: Abnormal   Collection Time: 10/24/14 10:13 AM  Result Value Ref Range   Squamous Epithelial / LPF FEW (A) RARE   WBC, UA 0-2 <3 WBC/hpf   RBC / HPF 3-6 <3 RBC/hpf    Comment: RESULT CHECKED   Bacteria, UA RARE RARE  I-Stat venous blood gas, ED     Status: Abnormal   Collection Time: 10/24/14  4:23 PM  Result Value Ref Range   pH, Ven 7.143 (LL) 7.250 - 7.300   pCO2, Ven 12.1 (L) 45.0 -  50.0 mmHg   pO2, Ven 80.0 (H) 30.0 - 45.0 mmHg   Bicarbonate 4.2 (L) 20.0 - 24.0 mEq/L   TCO2 <5 0 - 100 mmol/L   O2 Saturation 92.0 %   Acid-base deficit 22.0 (H) 0.0 - 2.0 mmol/L   Collection site BRACHIAL ARTERY    Sample type VENOUS    Comment NOTIFIED PHYSICIAN      ROS: see hpi  Physical Exam: Filed Vitals:   10/24/14 1629  BP: 202/82  Pulse: 94  Temp:   Resp: 33     General: thin adult BF in ER anxious co abd pain and nausea  HEENT: [place] , MMdry Neck: no jvd  Heart: Regular Tachy , No rub,mur, gallop Lungs: CTA bilat and some tachypnea  Abdomen: BS pos. Soft , slightly tender diffusely Extremities:  No pedal edema Skin:  No overt rash Neuro: ox3, moves all extrem Dialysis Access: r fem  avgg pos bruit   Dialysis Orders: [facility]: [facility]  on TTS . EDW 61.0 kg ( leaving below edw past few txs and htn pre post hd ) HD Bath 1.0k, 2.5ca  Time 4hrs Heparin 3000. Access R fem Avgg     Hectorol 3 mcg IV/HD  Aranesp 150mcg   Units IV/HD weekly  Venofer  0  Other  Op lab 10.9 hgb ca  8.8 phos 8.0   Assessment/Plan 1. Hyperkalemia/ DKA=  Hd tonight/ BS control per admit 2. Hyponatremia= related to above with Metab acidosis - correct with HD and BS control / 3. ESRD -  HD TTS ([place]) 4. Hypertension/volume  -  No excess vol. On exam or cxr / vomitting  No uf on hd/ slow hydration 5. Anemia  -  ESA  On hd 6. Metabolic bone disease -   [place]inder  When po and vit d on hd 7. Iddm Type 1 /BLIND- insulin per admit 8. Depression- on no meds from op listing/ noted she singed out of hosp.  Ernest Haber, PA-C [facility] Beeper [phone] 10/24/2014, 4:45 PM      I have seen and examined this patient and agree with plan as outlined by Ernest Haber, PA-C. Sylina Henion A,MD 10/25/2014 8:10 AM

## 2014-10-24 NOTE — ED Notes (Signed)
Patient states, "I do not want nausea medicine. I would like fluid because I need to be rehydrated." Patient made aware with dialysis patient is unable to get a bolus of fluid and has already been given some fluid. Patient verbalizes " I would like to talk to the doctor." Told patient I would let the Doctor know she would like to see her.

## 2014-10-24 NOTE — ED Notes (Signed)
States she started vomiting around 4 am this morning. She states that she is dehydrated. She also complains that she is constipated and has not had a bm in 5 days

## 2014-10-24 NOTE — Progress Notes (Signed)
Nurse called from HD stating that pts Q1hr FSBS was 137. Glucose stabalizer stated to turn off drip. Family med paged with results. MD stated to turn off insulin gtt and NS fluids. Pt is not on floor so Angelita in HD notified of the new orders.

## 2014-10-24 NOTE — ED Notes (Signed)
Patient returned from CT. Patient placed back on the monitor. Patient complaining of nausea.

## 2014-10-24 NOTE — ED Notes (Signed)
[id number] 

## 2014-10-24 NOTE — ED Notes (Signed)
Patient reports that she is dehydrated. States she has not had a bm in 5 days. She has not tried any otc stool softeners or laxatives. States they have not removed any fluid on dialysis this week because "i was under weight". States she has not been taking any of her diabetes medications in the last few days "because my sugars have been normal". States she has had some underlying nausea this week but no vomiting until early this morning. States she has been drinking fluids and having good urine output. Denies fever. Lips are dry. Mucous membranes are moderatly moist.

## 2014-10-24 NOTE — ED Notes (Signed)
Abnormal VBG results reported to Dr. Fredderick PhenixBelfi

## 2014-10-24 NOTE — ED Notes (Signed)
Patient placed on the bedpan. Patient drinking water and having no vomiting.

## 2014-10-25 ENCOUNTER — Inpatient Hospital Stay (HOSPITAL_COMMUNITY): Payer: 59

## 2014-10-25 DIAGNOSIS — R739 Hyperglycemia, unspecified: Secondary | ICD-10-CM | POA: Insufficient documentation

## 2014-10-25 DIAGNOSIS — K5909 Other constipation: Secondary | ICD-10-CM

## 2014-10-25 DIAGNOSIS — E0810 Diabetes mellitus due to underlying condition with ketoacidosis without coma: Secondary | ICD-10-CM

## 2014-10-25 DIAGNOSIS — R112 Nausea with vomiting, unspecified: Secondary | ICD-10-CM | POA: Insufficient documentation

## 2014-10-25 DIAGNOSIS — E108 Type 1 diabetes mellitus with unspecified complications: Secondary | ICD-10-CM | POA: Insufficient documentation

## 2014-10-25 DIAGNOSIS — E875 Hyperkalemia: Secondary | ICD-10-CM | POA: Insufficient documentation

## 2014-10-25 DIAGNOSIS — R109 Unspecified abdominal pain: Secondary | ICD-10-CM | POA: Insufficient documentation

## 2014-10-25 DIAGNOSIS — N186 End stage renal disease: Secondary | ICD-10-CM | POA: Insufficient documentation

## 2014-10-25 DIAGNOSIS — R100 Acute abdomen: Secondary | ICD-10-CM

## 2014-10-25 DIAGNOSIS — Z992 Dependence on renal dialysis: Secondary | ICD-10-CM

## 2014-10-25 DIAGNOSIS — I160 Hypertensive urgency: Secondary | ICD-10-CM | POA: Insufficient documentation

## 2014-10-25 DIAGNOSIS — E871 Hypo-osmolality and hyponatremia: Secondary | ICD-10-CM | POA: Insufficient documentation

## 2014-10-25 LAB — GLUCOSE, CAPILLARY
GLUCOSE-CAPILLARY: 223 mg/dL — AB (ref 70–99)
GLUCOSE-CAPILLARY: 164 mg/dL — AB (ref 70–99)
GLUCOSE-CAPILLARY: 144 mg/dL — AB (ref 70–99)
Glucose-Capillary: 140 mg/dL — ABNORMAL HIGH (ref 70–99)
Glucose-Capillary: 217 mg/dL — ABNORMAL HIGH (ref 70–99)
Glucose-Capillary: 178 mg/dL — ABNORMAL HIGH (ref 70–99)
Glucose-Capillary: 154 mg/dL — ABNORMAL HIGH (ref 70–99)
Glucose-Capillary: 150 mg/dL — ABNORMAL HIGH (ref 70–99)
Glucose-Capillary: 141 mg/dL — ABNORMAL HIGH (ref 70–99)

## 2014-10-25 LAB — BASIC METABOLIC PANEL
ANION GAP: 37 — AB (ref 5–15)
ANION GAP: 31 — AB (ref 5–15)
ANION GAP: 20 — AB (ref 5–15)
Anion gap: 32 — ABNORMAL HIGH (ref 5–15)
Anion gap: 22 — ABNORMAL HIGH (ref 5–15)
BUN: 29 mg/dL — ABNORMAL HIGH (ref 6–23)
BUN: 37 mg/dL — ABNORMAL HIGH (ref 6–23)
BUN: 38 mg/dL — ABNORMAL HIGH (ref 6–23)
BUN: 41 mg/dL — AB (ref 6–23)
BUN: 41 mg/dL — ABNORMAL HIGH (ref 6–23)
CALCIUM: 8.6 mg/dL (ref 8.4–10.5)
CALCIUM: 7.5 mg/dL — AB (ref 8.4–10.5)
CHLORIDE: 94 meq/L — AB (ref 96–112)
CHLORIDE: 95 meq/L — AB (ref 96–112)
CO2: 10 mmol/L — CL (ref 19–32)
CO2: 7 mmol/L — CL (ref 19–32)
CO2: 13 mmol/L — AB (ref 19–32)
CO2: 23 mmol/L (ref 19–32)
CO2: 25 mmol/L (ref 19–32)
CREATININE: 7.69 mg/dL — AB (ref 0.50–1.10)
CREATININE: 9.42 mg/dL — AB (ref 0.50–1.10)
CREATININE: 9.85 mg/dL — AB (ref 0.50–1.10)
Calcium: 7.9 mg/dL — ABNORMAL LOW (ref 8.4–10.5)
Calcium: 7.8 mg/dL — ABNORMAL LOW (ref 8.4–10.5)
Calcium: 7.6 mg/dL — ABNORMAL LOW (ref 8.4–10.5)
Chloride: 96 mEq/L (ref 96–112)
Chloride: 93 mEq/L — ABNORMAL LOW (ref 96–112)
Chloride: 92 mEq/L — ABNORMAL LOW (ref 96–112)
Creatinine, Ser: 9 mg/dL — ABNORMAL HIGH (ref 0.50–1.10)
Creatinine, Ser: 10.07 mg/dL — ABNORMAL HIGH (ref 0.50–1.10)
GFR calc Af Amer: 7 mL/min — ABNORMAL LOW (ref >90)
GFR calc Af Amer: 5 mL/min — ABNORMAL LOW (ref >90)
GFR calc non Af Amer: 5 mL/min — ABNORMAL LOW (ref >90)
GFR calc non Af Amer: 5 mL/min — ABNORMAL LOW (ref >90)
GFR calc non Af Amer: 5 mL/min — ABNORMAL LOW (ref >90)
GFR, EST AFRICAN AMERICAN: 6 mL/min — AB (ref >90)
GFR, EST AFRICAN AMERICAN: 6 mL/min — AB (ref >90)
GFR, EST AFRICAN AMERICAN: 5 mL/min — AB (ref >90)
GFR, EST NON AFRICAN AMERICAN: 6 mL/min — AB (ref >90)
GFR, EST NON AFRICAN AMERICAN: 5 mL/min — AB (ref >90)
Glucose, Bld: 212 mg/dL — ABNORMAL HIGH (ref 70–99)
Glucose, Bld: 171 mg/dL — ABNORMAL HIGH (ref 70–99)
Glucose, Bld: 142 mg/dL — ABNORMAL HIGH (ref 70–99)
Glucose, Bld: 162 mg/dL — ABNORMAL HIGH (ref 70–99)
Glucose, Bld: 159 mg/dL — ABNORMAL HIGH (ref 70–99)
POTASSIUM: 3.9 mmol/L (ref 3.5–5.1)
Potassium: 4.4 mmol/L (ref 3.5–5.1)
Potassium: 4.6 mmol/L (ref 3.5–5.1)
Potassium: 3.4 mmol/L — ABNORMAL LOW (ref 3.5–5.1)
Potassium: 3.2 mmol/L — ABNORMAL LOW (ref 3.5–5.1)
SODIUM: 138 mmol/L (ref 135–145)
Sodium: 138 mmol/L (ref 135–145)
Sodium: 139 mmol/L (ref 135–145)
Sodium: 136 mmol/L (ref 135–145)
Sodium: 139 mmol/L (ref 135–145)

## 2014-10-25 LAB — HEMOGLOBIN A1C
HEMOGLOBIN A1C: 6.8 % — AB (ref <5.7)
Mean Plasma Glucose: 148 mg/dL — ABNORMAL HIGH (ref <117)

## 2014-10-25 LAB — CBC
HEMATOCRIT: 32.5 % — AB (ref 36.0–46.0)
Hemoglobin: 11.4 g/dL — ABNORMAL LOW (ref 12.0–15.0)
MCH: 31.7 pg (ref 26.0–34.0)
MCHC: 35.1 g/dL (ref 30.0–36.0)
MCV: 90.3 fL (ref 78.0–100.0)
PLATELETS: 248 10*3/uL (ref 150–400)
RBC: 3.6 MIL/uL — AB (ref 3.87–5.11)
RDW: 13.8 % (ref 11.5–15.5)
WBC: 8.8 10*3/uL (ref 4.0–10.5)

## 2014-10-25 LAB — OCCULT BLOOD GASTRIC / DUODENUM (SPECIMEN CUP): Occult Blood, Gastric: POSITIVE — AB

## 2014-10-25 LAB — MRSA PCR SCREENING: MRSA by PCR: NEGATIVE

## 2014-10-25 MED ORDER — SODIUM CHLORIDE 0.9 % IV SOLN: INTRAVENOUS | Status: DC

## 2014-10-25 MED ORDER — CETYLPYRIDINIUM CHLORIDE 0.05 % MT LIQD
7.0000 mL | Freq: Two times a day (BID) | OROMUCOSAL | Status: DC
  Administered 2014-10-25 – 2014-10-29 (×8): 7 mL via OROMUCOSAL

## 2014-10-25 MED ORDER — INSULIN ASPART 100 UNIT/ML ~~LOC~~ SOLN
1.0000 [IU] | SUBCUTANEOUS | Status: DC
  Administered 2014-10-25: 1 [IU] via SUBCUTANEOUS

## 2014-10-25 MED ORDER — POTASSIUM CHLORIDE 10 MEQ/100ML IV SOLN: 10.0000 meq | INTRAVENOUS | Status: DC

## 2014-10-25 MED ORDER — SODIUM BICARBONATE 8.4 % IV SOLN: INTRAVENOUS | Status: DC

## 2014-10-25 MED ORDER — STERILE WATER FOR INJECTION IV SOLN
150.0000 meq | INTRAVENOUS | Status: DC
  Administered 2014-10-25: 150 meq via INTRAVENOUS
  Filled 2014-10-25: qty 850

## 2014-10-25 MED ORDER — SODIUM BICARBONATE 8.4 % IV SOLN
INTRAVENOUS | Status: DC
  Administered 2014-10-25 (×2): via INTRAVENOUS
  Filled 2014-10-25 (×5): qty 150

## 2014-10-25 MED ORDER — DEXTROSE 50 % IV SOLN: 25.0000 mL | INTRAVENOUS | Status: DC | PRN

## 2014-10-25 MED ORDER — POTASSIUM CHLORIDE CRYS ER 20 MEQ PO TBCR
40.0000 meq | EXTENDED_RELEASE_TABLET | Freq: Once | ORAL | Status: AC
  Administered 2014-10-25: 40 meq via ORAL
  Filled 2014-10-25 (×2): qty 2

## 2014-10-25 MED ORDER — SODIUM CHLORIDE 0.9 % IV SOLN
INTRAVENOUS | Status: DC
  Administered 2014-10-25: 1 [IU]/h via INTRAVENOUS
  Administered 2014-10-25: 0.8 [IU]/h via INTRAVENOUS
  Filled 2014-10-25: qty 2.5

## 2014-10-25 MED ORDER — INSULIN REGULAR BOLUS VIA INFUSION
0.0000 [IU] | Freq: Three times a day (TID) | INTRAVENOUS | Status: DC
  Filled 2014-10-25: qty 10

## 2014-10-25 MED ORDER — DEXTROSE-NACL 5-0.45 % IV SOLN: INTRAVENOUS | Status: DC

## 2014-10-25 NOTE — Progress Notes (Signed)
Attempted peripheral IV without success.   Pt stated,  "They usually put central lines in me"   "I've had them in my neck, chest and groin."   She was agreeable to another central line.    "They can't hardly get my blood work eitherRadio broadcast assistant"  Primary RN made aware and notifying MD.

## 2014-10-25 NOTE — Progress Notes (Signed)
Patient ID: [name], female   DOB: 11/01/1980, 34 y.o.   MRN: [id number]  [facility] Progress Note    Subjective:   Pt reports still feeling very sick and "dehydrated", still with abdominal pain and nausea, no diarrhea (reports constipation).   Objective:   BP 136/72 mmHg  Pulse 111  Temp(Src) 98.4 F (36.9 C) (Oral)  Resp 21  Ht 5\' 3"  (1.6 m)  Wt 59.6 kg (131 lb 6.3 oz)  BMI 23.28 kg/m2  SpO2 100%  LMP 10/16/2014  Intake/Output: I/O last 3 completed shifts: In: -  Out: 935 [Other:935]   Intake/Output this shift:    Weight change:   Physical Exam: Gen:WD WN AAF in mild distress WUJ:[id number]:tachycardic Resp:tachypnic Abd:+bs, soft, tender to palpation Ext:no edema, right thigh AVG +T/B  Labs: BMET  Recent Labs Lab 10/24/14 0945 10/24/14 1610 10/24/14 1838 10/25/14 0344  NA 123* 123* 133* 138  K 4.7 6.4* 3.1* 4.4  CL 77* 78* 92* 96  CO2 13* <5* 12* 10*  GLUCOSE 299* 399* 201* 212*  BUN 56* 65* 39* 29*  CREATININE 12.68* 13.54* 7.92* 7.69*  ALBUMIN 4.4  --   --   --   CALCIUM 9.2 8.2* 8.4 8.6   CBC  Recent Labs Lab 10/24/14 0945 10/25/14 0344  WBC 7.6 8.8  NEUTROABS 6.7  --   HGB 13.2 11.4*  HCT 36.2 32.5*  MCV 85.4 90.3  PLT 228 248    @IMGRELPRIORS @ Medications:    . amLODipine  10 mg Oral QHS  . antiseptic oral rinse  7 mL Mouth Rinse BID  . carvedilol  25 mg Oral BID WC  . [START ON 10/26/2014] darbepoetin (ARANESP) injection - DIALYSIS  100 mcg Intravenous Q Thu-HD  . docusate sodium  100 mg Oral BID  . [START ON 10/26/2014] doxercalciferol  3 mcg Intravenous Q T,Th,Sa-HD  . heparin  5,000 Units Subcutaneous 3 times per day  . insulin aspart  1-3 Units Subcutaneous Q2H  . levothyroxine  100 mcg Oral QAC breakfast  . lisinopril  20 mg Oral BID  . senna  1 tablet Oral QHS  . sodium chloride  3 mL Intravenous Q12H  . sodium chloride  3 mL Intravenous Q12H   Dialysis Orders: [facility]: [facility] on [facility] . EDW 61.0 kg ( leaving  below edw past few txs and htn pre post hd ) HD Bath 1.0k, 2.5ca Time 4hrs Heparin 3000. Access R fem Avgg  Hectorol 3 mcg IV/HD Aranesp 100mcg Units IV/HD weekly Venofer 0  Other Op lab 10.9 hgb ca 8.8 phos 8.0   Assessment/Plan 1. Hyperkalemia/ DKA= tolerated Hd well last night but continues to complain of Abd pain, SOB, and feeling dehydrated.  Pt does produce urine and has persistent metabolic acidosis.  Will start isotonic bicarb and cont to manage insulin per Primary svc.  Unclear inciting event, possible gastroenteritis given 3 day h/o N/V/abd pain.   2. Hyponatremia= corrected with HD and BS control  3. ESRD - HD TTS [facility]), plan for HD again tomorrow 4. Hypertension/volume - No excess vol. On exam or cxr / vomitting No uf on hd/ slow hydration 5. Anemia - ESA On hd 6. Metabolic bone disease - Binder When po and vit d on hd 7. Iddm Type 1 /BLIND- insulin per admit 8. Depression- on no meds from op listing/ noted she singed out of hosp.  Yannis Gumbs A 10/25/2014, 8:38 AM

## 2014-10-25 NOTE — Progress Notes (Signed)
CRITICAL VALUE ALERT  Critical value received:  C02 Date of notification: 10/25/13  Time of notification: 1306 Critical value read back:yes  Nurse who received alert: Mariel KanskyWally Reynolds  MD notified (1st page): yes Time of first page: 1308  MD notified (2nd page):  Time of second page:  Responding MD:  Dr. Doroteo GlassmanPhelps Time MD responded:  1314

## 2014-10-25 NOTE — Progress Notes (Signed)
Family Medicine Teaching Service Daily Progress Note Intern Pager: [phone]  Patient name: [name] Medical record number: [id number] Date of birth: 10/03/1981 Age: 34 y.o. Gender: female  Primary Care Provider: Hazeline JunkerGrunz, Ryan, MD Consultants: Nephrology Code Status: Full  Pt Overview and Major Events to Date:  1/19: Admitted for hyperglycemia/DKA  Assessment and Plan: [name] is a 34 y.o. female presenting with DKA secondary to vomiting. PMH is significant for type 1 diabetes mellitus, diabetic retinopathy causing blindness, ESRD, HTN, hypothyroidism, peripheral polyneuropathy, and h/o DVT of UE.  # DKA in setting of T1DM- Home medications of diabetes include Novolog 1-3U daily, and Lantus 14U BID. Reports not taking medications for last few days. Glucose 299-451 in ED. Brittle diabetic with rapid changes in blood glucose. Was taken off insulin drip in ED. Persistent metabolic acidosis with AG. Inciting event could be gastroenteritis(N/V) vs failure to administrate medication accurately (patient states she is taking different than prescribed).  - VBG: pH 7.143, pCO2 12.1, pO2 80, Bicarb 4.2 - Labs today: K 4.4, CO2 10, AG 32  - CXR- no acute cardiopulmonary disease, renal calculus right kidney - BMET q4 hrs until AG closed - restarted insulin drip once AG closed will transition to subq insulin - fluids d5 1/2NS with bicarb - Follow CBGs and electrolytes  # Abdominal Pain: No bowel movement x1 week. RUQ pain and tender to palpation, no concern for acute abdomen. No fevers noted. CT renal w/o contrast negative. UA not consistent with UTI. WBC 7.6 on admission. Pain could be associated with pancreatitis vs gallbladder etiology based off location.  - will get complete abdominal US - will perform rectal exam to look for stool burden - Acetaminophen PRN pain and fever - Morphine 4mg  q4hr PRN pain - Zofran PRN nausea - Initiate bowel regimen, colace/senna - CT renal w/o - fluid  overload with mild ascites and mild anasarca, fatty infiltration of liver  # ESRD- Receives dialysis on Tuesday, Thursday, Saturday. Was unable to receive dialysis prior to admission today. - Received HD yesterday, will continue as scheduled (plan tomorrow) - Creatinine 13.54 on admission. Now 7.69. - hyponatremia corrected on HD - continue ESA - Nephrology consulted. Appreciate recommendations.  # Hypothyroidism: Last TSH 3.49 (06/2014). - Continue home medication of Levothyroxine 100mcg  # HTN: BP 207/100 in ED. Home medications include Amlodipine 10mg , Coreg 25mg  BID, and Lisinopril 20mg  BID. Pressures stable. - Continue Amlodipine, Coreg, and Lisinopril   FEN/GI: NPO; d5 1/2 NS with bicarb Prophylaxis: Subcutaneous Heparin   Disposition: Continue current management on SDU. Pending improvement and stabilization.  Subjective:  Patient still in a lot of distress this morning. Is endorsing vomiting and still feeling very sick. Patient states she still has not had a BM. Her abdominal pain is still present but abdomen not as hard as before. Patient seems confused about home medication regimen this morning. Of note, patient states she can still make good urine.  Objective: Temp:  [97.7 F (36.5 C)-99.1 F (37.3 C)] 98.4 F (36.9 C) (01/20 0807) Pulse Rate:  [81-115] 111 (01/20 0807) Resp:  [15-33] 21 (01/20 0807) BP: (136-207)/(61-101) 136/72 mmHg (01/20 0807) SpO2:  [98 %-100 %] 100 % (01/20 0807) Weight:  [131 lb 6.3 oz (59.6 kg)] 131 lb 6.3 oz (59.6 kg) (01/19 2246) Physical Exam: General: moderate distress, ill-appearing HEENT: Dry mucous membranes. Normocephalic. Blind.   Cardiovascular: Tachycardic. S1 and S2 noted. Murmur noted. Regular rate. Respiratory: Clear to auscultation bilaterally. No wheezes/rales/rhonchi. Tachypnea. Kussmaul breathing.  Abdomen: Hypoactive  bowel sounds. RUQ tenderness. No rebound tenderness noted. No guarding. Soft.  Extremities: No edema  noted. Pulses palpated. HD site on R leg. Skin: No rashes noted. Warm. Neuro: A&O. No focal deficits.  Laboratory: Results for orders placed or performed during the hospital encounter of 10/24/14 (from the past 24 hour(s))  Comprehensive metabolic panel     Status: Abnormal   Collection Time: 10/24/14  9:45 AM  Result Value Ref Range   Sodium 123 (L) 135 - 145 mmol/L   Potassium 4.7 3.5 - 5.1 mmol/L   Chloride 77 (L) 96 - 112 mEq/L   CO2 13 (L) 19 - 32 mmol/L   Glucose, Bld 299 (H) 70 - 99 mg/dL   BUN 56 (H) 6 - 23 mg/dL   Creatinine, Ser 45.40 (H) 0.50 - 1.10 mg/dL   Calcium 9.2 8.4 - 98.1 mg/dL   Total Protein 8.6 (H) 6.0 - 8.3 g/dL   Albumin 4.4 3.5 - 5.2 g/dL   AST 24 0 - 37 U/L   ALT 22 0 - 35 U/L   Alkaline Phosphatase 100 39 - 117 U/L   Total Bilirubin 2.0 (H) 0.3 - 1.2 mg/dL   GFR calc non Af Amer 3 (L) >90 mL/min   GFR calc Af Amer 4 (L) >90 mL/min   Anion gap 33 (H) 5 - 15  CBC with Differential     Status: Abnormal   Collection Time: 10/24/14  9:45 AM  Result Value Ref Range   WBC 7.6 4.0 - 10.5 K/uL   RBC 4.24 3.87 - 5.11 MIL/uL   Hemoglobin 13.2 12.0 - 15.0 g/dL   HCT 19.1 47.8 - 29.5 %   MCV 85.4 78.0 - 100.0 fL   MCH 31.1 26.0 - 34.0 pg   MCHC 36.5 (H) 30.0 - 36.0 g/dL   RDW 62.1 30.8 - 65.7 %   Platelets 228 150 - 400 K/uL   Neutrophils Relative % 88 (H) 43 - 77 %   Neutro Abs 6.7 1.7 - 7.7 K/uL   Lymphocytes Relative 9 (L) 12 - 46 %   Lymphs Abs 0.7 0.7 - 4.0 K/uL   Monocytes Relative 3 3 - 12 %   Monocytes Absolute 0.2 0.1 - 1.0 K/uL   Eosinophils Relative 0 0 - 5 %   Eosinophils Absolute 0.0 0.0 - 0.7 K/uL   Basophils Relative 0 0 - 1 %   Basophils Absolute 0.0 0.0 - 0.1 K/uL  Lipase, blood     Status: None   Collection Time: 10/24/14  9:45 AM  Result Value Ref Range   Lipase 48 11 - 59 U/L  Urinalysis, Routine w reflex microscopic     Status: Abnormal   Collection Time: 10/24/14 10:13 AM  Result Value Ref Range   Color, Urine PINK YELLOW    APPearance CLEAR CLEAR   Specific Gravity, Urine 1.016 1.005 - 1.030   pH 5.5 5.0 - 8.0   Glucose, UA 500 (A) NEGATIVE mg/dL   Hgb urine dipstick LARGE (A) NEGATIVE   Bilirubin Urine NEGATIVE NEGATIVE   Ketones, ur >80 (A) NEGATIVE mg/dL   Protein, ur >846 (A) NEGATIVE mg/dL   Urobilinogen, UA 0.2 0.0 - 1.0 mg/dL   Nitrite NEGATIVE NEGATIVE   Leukocytes, UA TRACE (A) NEGATIVE  Pregnancy, urine     Status: None   Collection Time: 10/24/14 10:13 AM  Result Value Ref Range   Preg Test, Ur NEGATIVE NEGATIVE  Urine microscopic-add on     Status: Abnormal  Collection Time: 10/24/14 10:13 AM  Result Value Ref Range   Squamous Epithelial / LPF FEW (A) RARE   WBC, UA 0-2 <3 WBC/hpf   RBC / HPF 3-6 <3 RBC/hpf   Bacteria, UA RARE RARE  Basic metabolic panel     Status: Abnormal   Collection Time: 10/24/14  4:10 PM  Result Value Ref Range   Sodium 123 (L) 135 - 145 mmol/L   Potassium 6.4 (HH) 3.5 - 5.1 mmol/L   Chloride 78 (L) 96 - 112 mEq/L   CO2 <5 (LL) 19 - 32 mmol/L   Glucose, Bld 399 (H) 70 - 99 mg/dL   BUN 65 (H) 6 - 23 mg/dL   Creatinine, Ser 16.10 (H) 0.50 - 1.10 mg/dL   Calcium 8.2 (L) 8.4 - 10.5 mg/dL   GFR calc non Af Amer 3 (L) >90 mL/min   GFR calc Af Amer 4 (L) >90 mL/min   Anion gap NOT CALCULATED 5 - 15  Ketones, qualitative     Status: Abnormal   Collection Time: 10/24/14  4:10 PM  Result Value Ref Range   Acetone, Bld MODERATE (A) NEGATIVE  I-Stat venous blood gas, ED     Status: Abnormal   Collection Time: 10/24/14  4:23 PM  Result Value Ref Range   pH, Ven 7.143 (LL) 7.250 - 7.300   pCO2, Ven 12.1 (L) 45.0 - 50.0 mmHg   pO2, Ven 80.0 (H) 30.0 - 45.0 mmHg   Bicarbonate 4.2 (L) 20.0 - 24.0 mEq/L   TCO2 <5 0 - 100 mmol/L   O2 Saturation 92.0 %   Acid-base deficit 22.0 (H) 0.0 - 2.0 mmol/L   Collection site BRACHIAL ARTERY    Sample type VENOUS    Comment NOTIFIED PHYSICIAN   CBG monitoring, ED     Status: Abnormal   Collection Time: 10/24/14  4:50 PM   Result Value Ref Range   Glucose-Capillary 451 (H) 70 - 99 mg/dL  Basic metabolic panel     Status: Abnormal   Collection Time: 10/24/14  6:38 PM  Result Value Ref Range   Sodium 133 (L) 135 - 145 mmol/L   Potassium 3.1 (L) 3.5 - 5.1 mmol/L   Chloride 92 (L) 96 - 112 mEq/L   CO2 12 (L) 19 - 32 mmol/L   Glucose, Bld 201 (H) 70 - 99 mg/dL   BUN 39 (H) 6 - 23 mg/dL   Creatinine, Ser 9.60 (H) 0.50 - 1.10 mg/dL   Calcium 8.4 8.4 - 45.4 mg/dL   GFR calc non Af Amer 6 (L) >90 mL/min   GFR calc Af Amer 7 (L) >90 mL/min   Anion gap 29 (H) 5 - 15  CBG monitoring, ED     Status: Abnormal   Collection Time: 10/24/14  6:40 PM  Result Value Ref Range   Glucose-Capillary 244 (H) 70 - 99 mg/dL  Glucose, capillary     Status: Abnormal   Collection Time: 10/24/14  8:11 PM  Result Value Ref Range   Glucose-Capillary 137 (H) 70 - 99 mg/dL  Glucose, capillary     Status: Abnormal   Collection Time: 10/24/14  9:10 PM  Result Value Ref Range   Glucose-Capillary 126 (H) 70 - 99 mg/dL  MRSA PCR Screening     Status: None   Collection Time: 10/24/14 10:33 PM  Result Value Ref Range   MRSA by PCR NEGATIVE NEGATIVE  Glucose, capillary     Status: Abnormal   Collection Time: 10/24/14 10:44 PM  Result Value Ref Range   Glucose-Capillary 170 (H) 70 - 99 mg/dL  Glucose, capillary     Status: Abnormal   Collection Time: 10/25/14  2:02 AM  Result Value Ref Range   Glucose-Capillary 140 (H) 70 - 99 mg/dL  CBC     Status: Abnormal   Collection Time: 10/25/14  3:44 AM  Result Value Ref Range   WBC 8.8 4.0 - 10.5 K/uL   RBC 3.60 (L) 3.87 - 5.11 MIL/uL   Hemoglobin 11.4 (L) 12.0 - 15.0 g/dL   HCT 19.1 (L) 47.8 - 29.5 %   MCV 90.3 78.0 - 100.0 fL   MCH 31.7 26.0 - 34.0 pg   MCHC 35.1 30.0 - 36.0 g/dL   RDW 62.1 30.8 - 65.7 %   Platelets 248 150 - 400 K/uL  Basic metabolic panel     Status: Abnormal   Collection Time: 10/25/14  3:44 AM  Result Value Ref Range   Sodium 138 135 - 145 mmol/L    Potassium 4.4 3.5 - 5.1 mmol/L   Chloride 96 96 - 112 mEq/L   CO2 10 (LL) 19 - 32 mmol/L   Glucose, Bld 212 (H) 70 - 99 mg/dL   BUN 29 (H) 6 - 23 mg/dL   Creatinine, Ser 8.46 (H) 0.50 - 1.10 mg/dL   Calcium 8.6 8.4 - 96.2 mg/dL   GFR calc non Af Amer 6 (L) >90 mL/min   GFR calc Af Amer 7 (L) >90 mL/min   Anion gap 32 (H) 5 - 15  Glucose, capillary     Status: Abnormal   Collection Time: 10/25/14  4:33 AM  Result Value Ref Range   Glucose-Capillary 217 (H) 70 - 99 mg/dL  Glucose, capillary     Status: Abnormal   Collection Time: 10/25/14  6:25 AM  Result Value Ref Range   Glucose-Capillary 223 (H) 70 - 99 mg/dL  Glucose, capillary     Status: Abnormal   Collection Time: 10/25/14  8:06 AM  Result Value Ref Range   Glucose-Capillary 178 (H) 70 - 99 mg/dL    Imaging/Diagnostic Tests: Dg Abd Acute W/chest 10/24/2014   IMPRESSION: NO ACTIVE CARDIOPULMONARY DISEASE  NORMAL BOWEL GAS PATTERN.  POSSIBLE SMALL LEFT RENAL CALCULUS.     Ct Renal Stone Study 10/24/2014  IMPRESSION: Findings consistent with fluid overload with mild amount of ascites and mild anasarca.  Fatty infiltration of liver.   Electronically Signed   By: Marlan Palau M.D.   On: 10/24/2014 13:16   Pincus Large, DO 10/25/2014, 8:35 AM PGY-1, [facility] FPTS Intern pager: [phone], text pages welcome

## 2014-10-25 NOTE — Progress Notes (Signed)
CRITICAL VALUE ALERT  Critical value received:  CO2 10   Date of notification:  10/25/2014  Time of notification:  4:47 AM   Critical value read back:Yes.    Nurse who received alert:  Cresenciano LickMikaela Hawley Pavia, RN   MD notified (1st page):  Family Med MD  Time of first page:  4:48 AM     Responding MD:    Time MD responded: 4:49 AM

## 2014-10-25 NOTE — Progress Notes (Signed)
Inpatient Diabetes Program Recommendations  AACE/ADA: New Consensus Statement on Inpatient Glycemic Control (2013)  Target Ranges:  Prepandial:   less than 140 mg/dL      Peak postprandial:   less than 180 mg/dL (1-2 hours)      Critically ill patients:  140 - 180 mg/dL   Reason for Assessment:   Results for [name], [name] (MRN [id number]) as of 10/25/2014 10:11  Ref. Range 10/25/2014 03:44  Sodium Latest Range: 135-145 mmol/L 138  Potassium Latest Range: 3.5-5.1 mmol/L 4.4  Chloride Latest Range: 96-112 mEq/L 96  CO2 Latest Range: 19-32 mmol/L 10 (LL)  BUN Latest Range: 6-23 mg/dL 29 (H)  Creatinine Latest Range: 0.50-1.10 mg/dL 4.097.69 (H)  Calcium Latest Range: 8.4-10.5 mg/dL 8.6  GFR calc non Af Amer Latest Range: >90 mL/min 6 (L)  GFR calc Af Amer Latest Range: >90 mL/min 7 (L)  Glucose Latest Range: 70-99 mg/dL 811212 (H)  Anion gap Latest Range: 5-15  32 (H)   Diabetes history: Type 1 diabetes  Outpatient Diabetes medications:  Novolog, Patient states she is not taking Lantus however according to medication reconciliation she was supposed to be taking Lantus 14 units bid  Current orders for Inpatient glycemic control: Novolog correction 1-3 q 2 hours.  Note that CO2 is still low.  Called and discussed with MD. May need restart of Insulin drip.  Will likely need dextrose in IV fluids also.  Thanks, Beryl MeagerJenny Leylanie Woodmansee, RN, BC-ADM Inpatient Diabetes Coordinator Pager [phone]

## 2014-10-25 NOTE — Progress Notes (Signed)
MD made aware that pt is stating that pain is in new location. She states that she feels like "there is pressure at her pelvis and back" but there is no cramping even though she states that she is still on period after 9 days which is not normal for th pt. Pt also stating that she is not wanting to take morphine because of the way it makes her feel. MD made aware of all things. No changes at this time will cont to monitor.  Renwick Asman M. Derrell LollingIngram, RN, BSN 10/25/2014 4:53 AM

## 2014-10-26 DIAGNOSIS — E1011 Type 1 diabetes mellitus with ketoacidosis with coma: Secondary | ICD-10-CM

## 2014-10-26 DIAGNOSIS — R112 Nausea with vomiting, unspecified: Secondary | ICD-10-CM

## 2014-10-26 DIAGNOSIS — R1013 Epigastric pain: Secondary | ICD-10-CM

## 2014-10-26 LAB — GLUCOSE, CAPILLARY
GLUCOSE-CAPILLARY: 157 mg/dL — AB (ref 70–99)
GLUCOSE-CAPILLARY: 154 mg/dL — AB (ref 70–99)
GLUCOSE-CAPILLARY: 152 mg/dL — AB (ref 70–99)
GLUCOSE-CAPILLARY: 131 mg/dL — AB (ref 70–99)
GLUCOSE-CAPILLARY: 147 mg/dL — AB (ref 70–99)
GLUCOSE-CAPILLARY: 189 mg/dL — AB (ref 70–99)
GLUCOSE-CAPILLARY: 187 mg/dL — AB (ref 70–99)
GLUCOSE-CAPILLARY: 143 mg/dL — AB (ref 70–99)
Glucose-Capillary: 151 mg/dL — ABNORMAL HIGH (ref 70–99)
Glucose-Capillary: 184 mg/dL — ABNORMAL HIGH (ref 70–99)
Glucose-Capillary: 146 mg/dL — ABNORMAL HIGH (ref 70–99)
Glucose-Capillary: 155 mg/dL — ABNORMAL HIGH (ref 70–99)
Glucose-Capillary: 157 mg/dL — ABNORMAL HIGH (ref 70–99)
Glucose-Capillary: 162 mg/dL — ABNORMAL HIGH (ref 70–99)
Glucose-Capillary: 148 mg/dL — ABNORMAL HIGH (ref 70–99)
Glucose-Capillary: 150 mg/dL — ABNORMAL HIGH (ref 70–99)
Glucose-Capillary: 175 mg/dL — ABNORMAL HIGH (ref 70–99)
Glucose-Capillary: 176 mg/dL — ABNORMAL HIGH (ref 70–99)
Glucose-Capillary: 179 mg/dL — ABNORMAL HIGH (ref 70–99)
Glucose-Capillary: 99 mg/dL (ref 70–99)
Glucose-Capillary: 142 mg/dL — ABNORMAL HIGH (ref 70–99)
Glucose-Capillary: 88 mg/dL (ref 70–99)
Glucose-Capillary: 148 mg/dL — ABNORMAL HIGH (ref 70–99)

## 2014-10-26 LAB — BASIC METABOLIC PANEL
ANION GAP: 20 — AB (ref 5–15)
Anion gap: 20 — ABNORMAL HIGH (ref 5–15)
Anion gap: 24 — ABNORMAL HIGH (ref 5–15)
Anion gap: 12 (ref 5–15)
BUN: 43 mg/dL — ABNORMAL HIGH (ref 6–23)
BUN: 43 mg/dL — AB (ref 6–23)
BUN: 40 mg/dL — AB (ref 6–23)
BUN: 11 mg/dL (ref 6–23)
CHLORIDE: 93 meq/L — AB (ref 96–112)
CHLORIDE: 91 meq/L — AB (ref 96–112)
CO2: 25 mmol/L (ref 19–32)
CO2: 27 mmol/L (ref 19–32)
CO2: 23 mmol/L (ref 19–32)
CO2: 25 mmol/L (ref 19–32)
CREATININE: 10.82 mg/dL — AB (ref 0.50–1.10)
CREATININE: 10.97 mg/dL — AB (ref 0.50–1.10)
Calcium: 7.6 mg/dL — ABNORMAL LOW (ref 8.4–10.5)
Calcium: 7.7 mg/dL — ABNORMAL LOW (ref 8.4–10.5)
Calcium: 7.8 mg/dL — ABNORMAL LOW (ref 8.4–10.5)
Calcium: 8.3 mg/dL — ABNORMAL LOW (ref 8.4–10.5)
Chloride: 92 mEq/L — ABNORMAL LOW (ref 96–112)
Chloride: 96 mEq/L (ref 96–112)
Creatinine, Ser: 10.59 mg/dL — ABNORMAL HIGH (ref 0.50–1.10)
Creatinine, Ser: 5.19 mg/dL — ABNORMAL HIGH (ref 0.50–1.10)
GFR calc Af Amer: 5 mL/min — ABNORMAL LOW (ref >90)
GFR calc Af Amer: 5 mL/min — ABNORMAL LOW (ref >90)
GFR calc Af Amer: 12 mL/min — ABNORMAL LOW (ref >90)
GFR calc non Af Amer: 4 mL/min — ABNORMAL LOW (ref >90)
GFR calc non Af Amer: 4 mL/min — ABNORMAL LOW (ref >90)
GFR, EST AFRICAN AMERICAN: 5 mL/min — AB (ref >90)
GFR, EST NON AFRICAN AMERICAN: 4 mL/min — AB (ref >90)
GFR, EST NON AFRICAN AMERICAN: 10 mL/min — AB (ref >90)
Glucose, Bld: 166 mg/dL — ABNORMAL HIGH (ref 70–99)
Glucose, Bld: 169 mg/dL — ABNORMAL HIGH (ref 70–99)
Glucose, Bld: 187 mg/dL — ABNORMAL HIGH (ref 70–99)
Glucose, Bld: 298 mg/dL — ABNORMAL HIGH (ref 70–99)
POTASSIUM: 3.3 mmol/L — AB (ref 3.5–5.1)
POTASSIUM: 4.9 mmol/L (ref 3.5–5.1)
Potassium: 3.7 mmol/L (ref 3.5–5.1)
Potassium: 3.4 mmol/L — ABNORMAL LOW (ref 3.5–5.1)
SODIUM: 139 mmol/L (ref 135–145)
SODIUM: 133 mmol/L — AB (ref 135–145)
Sodium: 138 mmol/L (ref 135–145)
Sodium: 138 mmol/L (ref 135–145)

## 2014-10-26 LAB — CBC
HCT: 29.5 % — ABNORMAL LOW (ref 36.0–46.0)
HEMOGLOBIN: 10.5 g/dL — AB (ref 12.0–15.0)
MCH: 31.1 pg (ref 26.0–34.0)
MCHC: 35.6 g/dL (ref 30.0–36.0)
MCV: 87.3 fL (ref 78.0–100.0)
Platelets: 197 10*3/uL (ref 150–400)
RBC: 3.38 MIL/uL — AB (ref 3.87–5.11)
RDW: 14.3 % (ref 11.5–15.5)
WBC: 7.3 10*3/uL (ref 4.0–10.5)

## 2014-10-26 MED ORDER — DARBEPOETIN ALFA 100 MCG/0.5ML IJ SOSY
PREFILLED_SYRINGE | INTRAMUSCULAR | Status: AC
  Administered 2014-10-26: 15:00:00
  Filled 2014-10-26: qty 0.5

## 2014-10-26 MED ORDER — POTASSIUM CHLORIDE CRYS ER 20 MEQ PO TBCR
40.0000 meq | EXTENDED_RELEASE_TABLET | Freq: Two times a day (BID) | ORAL | Status: DC
Start: 2014-10-26 — End: 2014-10-27
  Administered 2014-10-26 – 2014-10-27 (×3): 40 meq via ORAL
  Filled 2014-10-26 (×3): qty 2

## 2014-10-26 MED ORDER — PANTOPRAZOLE SODIUM 40 MG PO TBEC
40.0000 mg | DELAYED_RELEASE_TABLET | Freq: Every day | ORAL | Status: DC
  Administered 2014-10-26: 40 mg via ORAL
  Filled 2014-10-26: qty 1

## 2014-10-26 MED ORDER — SORBITOL 70 % SOLN
960.0000 mL | TOPICAL_OIL | Freq: Once | ORAL | Status: AC
  Administered 2014-10-26: 960 mL via RECTAL
  Filled 2014-10-26: qty 240

## 2014-10-26 MED ORDER — HEPARIN SODIUM (PORCINE) 1000 UNIT/ML DIALYSIS
20.0000 [IU]/kg | INTRAMUSCULAR | Status: DC | PRN
  Filled 2014-10-26: qty 2

## 2014-10-26 MED ORDER — PANTOPRAZOLE SODIUM 40 MG PO TBEC
40.0000 mg | DELAYED_RELEASE_TABLET | Freq: Two times a day (BID) | ORAL | Status: DC
  Administered 2014-10-26: 40 mg via ORAL
  Filled 2014-10-26: qty 1

## 2014-10-26 MED ORDER — DOXERCALCIFEROL 4 MCG/2ML IV SOLN
INTRAVENOUS | Status: AC
  Administered 2014-10-26: 15:00:00
  Filled 2014-10-26: qty 2

## 2014-10-26 MED ORDER — DEXTROSE-NACL 5-0.45 % IV SOLN
INTRAVENOUS | Status: DC
  Administered 2014-10-26: 09:00:00 via INTRAVENOUS

## 2014-10-26 NOTE — Progress Notes (Signed)
Pt insulin gtt going at 0, paged MD to make aware. Looking at chart from day shift and pt has not had an Anion Gap drawn since 0900. Lab notified and STAT labs drawn.

## 2014-10-26 NOTE — Consult Note (Signed)
Referring Provider: Dr. Gwendolyn Grant Primary Care Physician:  Hazeline Junker, MD Primary Gastroenterologist:  None   Reason for Consultation:  Abdominal pain and "brown emesis" that was gastric occult positive  HPI: [name] is a 34 y.o. female with PMX of DM1 (for the past 20 years), ESRD on HD (for the past 1.5 years), HTN, legally blind, hypothyroidism.  She presented to the ED at [facility] hospital on 1/19 with complaints of abdominal pain, nausea and vomiting.  She says that the nausea had been present for a few days, but the pain and vomiting began the day of admission and she vomited 6 or 7 times.  Complains of mid-abdominal pain, but also reports epigastric pain as well, which she thinks is her esophagus.  Says that the epigastric pain did not start until after she has been vomiting.  Since she is blind she had not been able to see her emesis, but was not aware of any blood present.  She also reports constipation.  Says that she normally has a BM every couple of days, but now she has not moved her bowels in over a week despite using some enemas at home and taking colace and senna here in the hospital.  She was admitted for DKA with glucose running [phone] in the ED.  Apparently one of her episodes of vomiting was gastric occult positive.  Her Hgb has decreased some during her admission as well, but her Hgb varies a lot and it is hard to tell what a baseline is for her.  On admission abdominal x-ray showed normal bowel gas pattern.  CT of the abdomen and pelvis without contrast showed fatty liver and findings c/w fluid overload with mild amount of ascites and mild anasarca.  Ultrasound of the abdomen today showed no findings to explain abdominal pain.  She is on protonix 40 mg BID orally here, but denies taking any medication for acid reflux or heartburn at home.  Past Medical History  Diagnosis Date  . 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 Hypothyroidism   . Hypertension   .  Type I diabetes mellitus   . DVT (deep venous thrombosis)     "both arms" (11/11/2013)  . High cholesterol   . Diabetic retinopathy associated with type 1 diabetes mellitus   . Heart murmur     "slight" (11/11/2013)  . Graves' disease     "had thyroid radioactively treated" (11/11/2013)  . End-stage renal disease on hemodialysis     "TTS; [place]" (11/11/2013)    Past Surgical History  Procedure Laterality Date  . Insertion of dialysis catheter N/A 04/01/2013    Procedure: INSERTION OF DIALYSIS CATHETER;  Surgeon: Pryor Ochoa, MD;  Location: [facility] OR;  Service: Vascular;  Laterality: N/A;  . Av fistula placement Right 04/07/2013    Procedure: INSERTION OF ARTERIOVENOUS (AV) GORE-TEX GRAFT THIGH;  Surgeon: Chuck Hint, MD;  Location: [facility] OR;  Service: Vascular;  Laterality: Right;  . Cataract extraction w/ intraocular lens implant Right 2009  . Eye surgery Right 2009    "laser OR for diabetic retinopathy" (05/23/2013)  . Enucleation Left ~ 2010  . Revision of arteriovenous goretex graft Right 11/11/2013    Procedure: REVISION OF ARTERIOVENOUS GORETEX GRAFT - RIGHT THIGH;  Surgeon: Chuck Hint, MD;  Location: [facility] OR;  Service: Vascular;  Laterality: Right;  [name] Bilateral 04/04/2013    Procedure: VENOGRAM;  Surgeon: Chuck Hint, MD;  Location:  [facility] CATH LAB;  Service: Cardiovascular;  Laterality: Bilateral;    Prior to Admission medications   Medication Sig Start Date End Date Taking? Authorizing Provider  amLODipine (NORVASC) 10 MG tablet Take 1 tablet (10 mg total) by mouth at bedtime. Patient taking differently: Take 10 mg by mouth 2 (two) times daily.  07/25/14  Yes Tyrone Nine, MD  carvedilol (COREG) 25 MG tablet Take 1 tablet (25 mg total) by mouth 2 (two) times daily with a meal. Patient taking differently: Take 25 mg by mouth daily.  08/03/14  Yes Tyrone Nine, MD  insulin aspart (NOVOLOG) 100 UNIT/ML injection Inject 1-3 Units into the skin once. sliding  scale   Yes Historical Provider, MD  levothyroxine (SYNTHROID, LEVOTHROID) 100 MCG tablet Take 1 tablet by mouth  daily 09/11/14  Yes Tyrone Nine, MD  lisinopril (PRINIVIL,ZESTRIL) 20 MG tablet Take 20 mg by mouth 2 (two) times daily.   Yes Historical Provider, MD  aspirin 81 MG chewable tablet Chew 1 tablet (81 mg total) by mouth daily. Patient not taking: Reported on 10/24/2014 06/30/14   Lora Havens Rumley, DO  gabapentin (NEURONTIN) 300 MG capsule Take 1 capsule (300 mg total) by mouth daily. Patient not taking: Reported on 10/24/2014 09/27/14   Raliegh Ip, DO  Insulin Glargine (LANTUS SOLOSTAR) 100 UNIT/ML Solostar Pen Inject 14 Units into the skin 2 (two) times daily. Patient not taking: Reported on 10/24/2014 08/03/14   Tyrone Nine, MD  isosorbide mononitrate (IMDUR) 30 MG 24 hr tablet Take 1 tablet (30 mg total) by mouth daily. Patient not taking: Reported on 10/24/2014 06/28/14   Glori Luis, MD  multivitamin (RENA-VIT) TABS tablet Take 1 tablet by mouth at bedtime. Patient not taking: Reported on 10/24/2014 09/27/14   Ashly M Gottschalk, DO  pantoprazole (PROTONIX) 40 MG tablet Take 1 tablet (40 mg total) by mouth daily. Patient not taking: Reported on 10/24/2014 06/30/14   Araceli Bouche, DO    Current Facility-Administered Medications  Medication Dose Route Frequency Provider Last Rate Last Dose  . 0.9 %  sodium chloride infusion  250 mL Intravenous PRN Abram Sander, MD      . acetaminophen (TYLENOL) tablet 650 mg  650 mg Oral Q6H PRN Abram Sander, MD   650 mg at 10/25/14 0207   Or  . acetaminophen (TYLENOL) suppository 650 mg  650 mg Rectal Q6H PRN Abram Sander, MD      . amLODipine (NORVASC) tablet 10 mg  10 mg Oral QHS Abram Sander, MD   10 mg at 10/25/14 2254  . antiseptic oral rinse (CPC / CETYLPYRIDINIUM CHLORIDE 0.05%) solution 7 mL  7 mL Mouth Rinse BID Janit Pagan, MD   7 mL at 10/26/14 1000  . carvedilol (COREG) tablet 25 mg  25 mg Oral BID WC Abram Sander, MD   25 mg at 10/25/14 1742  . Darbepoetin Alfa (ARANESP) injection 100 mcg  100 mcg Intravenous Q Thu-HD Donald Pore, PA-C      . dextrose 5 %-0.45 % sodium chloride infusion   Intravenous Continuous [place] Rumley, DO 10 mL/hr at 10/26/14 1131    . dextrose 50 % solution 25 mL  25 mL Intravenous PRN Pincus Large, DO      . docusate sodium (COLACE) capsule 100 mg  100 mg Oral BID Shirlee Latch, MD   100 mg at 10/26/14 1026  . doxercalciferol (HECTOROL) injection 3 mcg  3 mcg  Intravenous Q T,Th,Sa-HD Donald Pore, PA-C      . heparin injection 1,200 Units  20 Units/kg Dialysis PRN Terrial Rhodes, MD      . heparin injection 5,000 Units  5,000 Units Subcutaneous 3 times per day Abram Sander, MD   5,000 Units at 10/26/14 [phone]  . insulin regular (NOVOLIN R,HUMULIN R) 250 Units in sodium chloride 0.9 % 250 mL (1 Units/mL) infusion   Intravenous Continuous Pincus Large, DO 1.2 mL/hr at 10/26/14 0700    . levothyroxine (SYNTHROID, LEVOTHROID) tablet 100 mcg  100 mcg Oral QAC breakfast Abram Sander, MD   100 mcg at 10/26/14 0739  . lisinopril (PRINIVIL,ZESTRIL) tablet 20 mg  20 mg Oral BID Abram Sander, MD   Stopped at 10/26/14 1000  . morphine 2 MG/ML injection 4 mg  4 mg Intravenous Q4H PRN Abram Sander, MD   4 mg at 10/25/14 0515  . ondansetron (ZOFRAN) tablet 4 mg  4 mg Oral Q6H PRN Abram Sander, MD       Or  . ondansetron [facility]) injection 4 mg  4 mg Intravenous Q6H PRN Abram Sander, MD   4 mg at 10/24/14 2316  . pantoprazole (PROTONIX) EC tablet 40 mg  40 mg Oral BID Pincus Large, DO      . potassium chloride SA (K-DUR,KLOR-CON) CR tablet 40 mEq  40 mEq Oral BID Pincus Large, DO   40 mEq at 10/26/14 1026  . senna (SENOKOT) tablet 8.6 mg  1 tablet Oral QHS Shirlee Latch, MD   8.6 mg at 10/25/14 2254  . sodium chloride 0.9 % injection 3 mL  3 mL Intravenous Q12H Abram Sander, MD   3 mL at 10/25/14 1008  . sodium chloride 0.9 % injection 3 mL  3 mL  Intravenous Q12H Abram Sander, MD   3 mL at 10/26/14 1130    Allergies as of 10/24/2014  . (No Known Allergies)    Family History  Problem Relation Age of Onset  . Hypertension    . Cancer    . Thyroid disease    . Cancer Mother   . Asthma Father     History   Social History  . Marital Status: Unknown    Spouse Name: N/A    Number of Children: N/A  . Years of Education: N/A   Occupational History  . Not on file.   Social History Main Topics  . Smoking status: Never Smoker   . Smokeless tobacco: Never Used  . Alcohol Use: No  . Drug Use: No  . Sexual Activity: Yes   Other Topics Concern  . Not on file   Social History Narrative    Review of Systems: Ten point ROS is O/W negative except as mentioned in HPI.  Physical Exam: Vital signs in last 24 hours: Temp:  [98 F (36.7 C)-99.3 F (37.4 C)] 98.6 F (37 C) (01/21 1256) Pulse Rate:  [76-85] 76 (01/21 1256) Resp:  [12-19] 12 (01/21 1256) BP: (125-163)/(58-82) 163/80 mmHg (01/21 1256) SpO2:  [97 %-100 %] 97 % (01/21 1256) Weight:  [133 lb 2.5 oz (60.4 kg)] 133 lb 2.5 oz (60.4 kg) (01/21 1256) Last BM Date: 10/22/14 General:  Alert, Well-developed, well-nourished, pleasant and cooperative in NAD Head:  Normocephalic and atraumatic. Ears:  Normal auditory acuity. Mouth:  No deformity or lesions.   Lungs:  Clear throughout to auscultation.  No wheezes, crackles, or rhonchi.  Heart:  Regular  rate and rhythm.  Murmur noted. Abdomen:  Soft, non-distended.  BS present.  Diffuse TTP > in upper abdomen/epigastrium.   Rectal:  Deferred  Msk:  Symmetrical without gross deformities. Pulses:  Normal pulses noted. Extremities:  Without clubbing or edema. Neurologic:  Alert and  oriented x4;  grossly normal neurologically. Skin:  Intact without significant lesions or rashes. Psych:  Alert and cooperative. Normal mood and affect.  Intake/Output from previous day: 01/20 0701 - 01/21 0700 In: 600 [I.V.:600] Out: 50  [Urine:50] Intake/Output this shift: Total I/O In: 100 [I.V.:100] Out: -   Lab Results:  Recent Labs  10/24/14 0945 10/25/14 0344 10/26/14 0731  WBC 7.6 8.8 7.3  HGB 13.2 11.4* 10.5*  HCT 36.2 32.5* 29.5*  PLT 228 248 197   BMET  Recent Labs  10/26/14 0325 10/26/14 0731 10/26/14 0939  NA 138 138 139  K 3.7 3.3* 3.4*  CL 93* 91* 92*  CO2 25 27 23   GLUCOSE 166* 169* 187*  BUN 43* 43* 40*  CREATININE 10.59* 10.82* 10.97*  CALCIUM 7.6* 7.7* 7.8*   LFT  Recent Labs  10/24/14 0945  PROT 8.6*  ALBUMIN 4.4  AST 24  ALT 22  ALKPHOS 100  BILITOT 2.0*   Studies/Results: [place] Abdomen Complete  10/26/2014   CLINICAL DATA:  Acute abdominal pain  EXAM: ULTRASOUND ABDOMEN COMPLETE  COMPARISON:  Abdominal CT from yesterday  FINDINGS: Gallbladder: No gallstones or wall thickening visualized. No sonographic Murphy sign noted.  Common bile duct: Diameter: 4 mm  Liver: No focal lesion identified. Within normal limits in parenchymal echogenicity, but there is steatosis on preceding abdominal CT.  IVC: No abnormality visualized.  Pancreas: Visualized portion unremarkable.  Spleen: Size and appearance within normal limits.  Right Kidney: Length: 10 cm. Abnormal cortical echogenicity consistent with end-stage renal disease. No hydronephrosis.  Left Kidney: Length: 9 cm. No hydronephrosis. There is an 11 mm cyst in the interpolar region.  Abdominal aorta: No aneurysm visualized.  Other findings: None.  IMPRESSION: No findings to explain abdominal pain.   Electronically Signed   By: Tiburcio PeaJonathan  Watts M.D.   On: 10/26/2014 06:25    IMPRESSION:  -Abdominal pain and emesis, gastric occult positive but no overt bleeding:  This abdominal pain and vomiting sounds like an acute issue and not a chronic problem for her.  Her Hgb varies a lot over the past year so hard to tell what a baseline is for her.  She may have some gastroparesis that was aggravated by her DKA/elevated blood sugars recently, but  may have had some gastroenteritis as well.  The gastric occult positive emesis may have been from some esophagitis, etc induced by the repeated vomiting. -Constipation:  Has some mild constipation at baseline, but now no BM in over a week despite multiple attempted regimens. -IDDM, currently admitted with DKA -ESRD on HD TTS  PLAN: -Continue to treat DKA.  Needs better BS control. -Continue pantoprazole 40 mg BID. -Anti-emetics prn. -Will discuss with Dr. Marina GoodellPerry regarding evaluation with EGD. -Will give a SMOG enema later today when she is done with dialysis and then may need Miralax daily.  ZEHR, JESSICA D.  10/26/2014, 1:12 PM  Pager number [phone]  GI ATTENDING  History, laboratories, x-rays reviewed. Patient seen and examined. Agree with H&P as outlined above. Unfortunately 34 year old with multiple complications of diabetes. Admitted with DKA. Asked to see for recurrent nausea and vomiting. Nausea and vomiting commonly seen with DKA. Recommend appropriate hydration and correction of DKA  with good control of blood sugars. She probably has some level of diabetic gastroparesis as well. Agree with PPI to treat probable esophagitis. Antiemetics as needed for nausea. Would be reasonable to give her running antiemetics if necessary. Not sure that EGD would add much if she is on high-dose PPI (40 mg pantoprazole IV twice daily).  Wilhemina Bonito. Eda Keys., M.D. [facility] Division of Gastroenterology

## 2014-10-26 NOTE — Progress Notes (Signed)
Patient ID: [name], female   DOB: 14-May-1981, 34 y.o.   MRN: [id number]  [facility] Progress Note    Subjective:   Feels much better but still has abdominal pain   Objective:   BP 137/82 mmHg  Pulse 76  Temp(Src) 98.5 F (36.9 C) (Axillary)  Resp 19  Ht  (1.6 m)  Wt 59.6 kg (131 lb 6.3 oz)  BMI 23.28 kg/m2  SpO2 100%  LMP 10/16/2014  Intake/Output: I/O last 3 completed shifts: In: 600 [I.V.:600] Out: 985 [Urine:50; Other:935]   Intake/Output this shift:  Total I/O In: 100 [I.V.:100] Out: -  Weight change:   Physical Exam: Gen:WD WN AAF in NAD CVS:no rub Resp:cta OZH:YQMVHQ Ext:no edema, R thigh AVG +T/B  Labs: BMET  Recent Labs Lab 10/24/14 0945  10/25/14 0344 10/25/14 1054 10/25/14 1415 10/25/14 1848 10/25/14 2203 10/26/14 0325 10/26/14 0731  NA 123*  < > 138 138 139 136 139 138 138  K 4.7  < > 4.4 4.6 3.9 3.4* 3.2* 3.7 3.3*  CL 77*  < > 96 94* 95* 93* 92* 93* 91*  CO2 13*  < > 10* 7* 13* GLUCOSE 299*  < > 212* 171* 142* 162* 159* 166* 169*  BUN 56*  < > 29* 37* 38* 41* 41* 43* 43*  CREATININE 12.68*  < > 7.69* 9.00* 9.42* 9.85* 10.07* 10.59* 10.82*  ALBUMIN 4.4  --   --   --   --   --   --   --   --   CALCIUM 9.2  < > 8.6 7.9* 7.8* 7.6* 7.5* 7.6* 7.7*  < > = values in this interval not displayed. CBC  Recent Labs Lab 10/24/14 0945 10/25/14 0344 10/26/14 0731  WBC 7.6 8.8 7.3  NEUTROABS 6.7  --   --   HGB 13.2 11.4* 10.5*  HCT 36.2 32.5* 29.5*  MCV 85.4 90.3 87.3  PLT 228 248 197    @ Medications:    . amLODipine  10 mg Oral QHS  . antiseptic oral rinse  7 mL Mouth Rinse BID  . carvedilol  25 mg Oral BID WC  . darbepoetin (ARANESP) injection - DIALYSIS  100 mcg Intravenous Q Thu-HD  . docusate sodium  100 mg Oral BID  . doxercalciferol  3 mcg Intravenous Q T,Th,Sa-HD  . heparin  5,000 Units Subcutaneous 3 times per day  . levothyroxine  100 mcg Oral QAC breakfast  . lisinopril   20 mg Oral BID  . potassium chloride  40 mEq Oral BID  . senna  1 tablet Oral QHS  . sodium chloride  3 mL Intravenous Q12H  . sodium chloride  3 mL Intravenous Q12H    Dialysis Orders: Center: [facility] on TTS . EDW 61.0 kg ( leaving below edw past few txs and htn pre post hd ) HD Bath 1.0k, 2.5ca Time 4hrs Heparin 3000. Access R fem Avgg  Hectorol 3 mcg IV/HD Aranesp Units IV/HD weekly Venofer 0  Other Op lab 10.9 hgb ca 8.8 phos 8.0   Assessment/ Plan:    1. Hyperkalemia/ DKA=improved after reinstitution of insulin gtt and bicarb drip. 2. Hyponatremia= corrected with HD and BS control  3. ESRD - HD TTS [facility]), plan for HD again today 4. Hypertension/volume - No excess vol. On exam or cxr / vomitting No uf on hd/ slow hydration 5. Anemia - ESA On hd 6. Metabolic bone disease - Binder When po  and vit d on hd 7. Iddm Type 1 /BLIND- insulin per admit 8. Depression- on no meds from op listing/ noted she singed out of hosp.  Marck Mcclenny A 10/26/2014, 8:51 AM

## 2014-10-26 NOTE — Progress Notes (Signed)
Inpatient Diabetes Program Recommendations  AACE/ADA: New Consensus Statement on Inpatient Glycemic Control (2013)  Target Ranges:  Prepandial:   less than 140 mg/dL      Peak postprandial:   less than 180 mg/dL (1-2 hours)      Critically ill patients:  140 - 180 mg/dL   Results for [name], [name] (MRN [id number]) as of 10/26/2014 07:52  Ref. Range 10/26/2014 03:25  Sodium Latest Range: 135-145 mmol/L 138  Potassium Latest Range: 3.5-5.1 mmol/L 3.7  Chloride Latest Range: 96-112 mEq/L 93 (L)  CO2 Latest Range: 19-32 mmol/L 25  BUN Latest Range: 6-23 mg/dL 43 (H)  Creatinine Latest Range: 0.50-1.10 mg/dL [phone] (H)  Calcium Latest Range: 8.4-10.5 mg/dL 7.6 (L)  GFR calc non Af Amer Latest Range: >90 mL/min 4 (L)  GFR calc Af Amer Latest Range: >90 mL/min 5 (L)  Glucose Latest Range: 70-99 mg/dL [id number] (H)  Anion gap Latest Range: 5-15  20 (H)    Diabetes history: DM 1 Outpatient Diabetes medications: Novolog, Patient states she is not taking Lantus however according to medication reconciliation she was supposed to be taking Lantus 14 units bid Current orders for Inpatient glycemic control: Insulin gtt  Inpatient Diabetes Program Recommendations Insulin - IV drip/GlucoStabilizer: Noted patient's CO2 is WNL. If pt is appropriate to transition off the insulin gtt, please consider the following: Insulin - Basal: Please consider ordering Levemir 10 units Daily. Give 2 hours before insulin gtt is discontinued. Correction (SSI): Please consider Novolog 0-9 units Q4 hours while patient is NPO.  HgbA1C: 6.8% 10/25/14  Thanks,  Christena DeemShannon Terald Jump RN, MSN, [facility] Inpatient Diabetes Coordinator Team Pager [phone]

## 2014-10-26 NOTE — Progress Notes (Signed)
Family Medicine Teaching Service Daily Progress Note Intern Pager: [phone]  Patient name: [name] Medical record number: [id number] Date of birth: 1981-08-05 Age: 34 y.o. Gender: female  Primary Care Provider: Hazeline Junker, MD Consultants: Nephrology Code Status: Full  Pt Overview and Major Events to Date:  1/19: Admitted for hyperglycemia/DKA  Assessment and Plan: [name] is a 34 y.o. female presenting with DKA secondary to vomiting. PMH is significant for type 1 diabetes mellitus, diabetic retinopathy causing blindness, ESRD, HTN, hypothyroidism, peripheral polyneuropathy, and h/o DVT of UE.  # DKA in setting of T1DM- Home medications of diabetes include Novolog 1-3U daily, and Lantus 14U BID. Reports not taking medications for last few days. Glucose 299-451 in ED. Brittle diabetic with rapid changes in blood glucose. Was taken off insulin drip in ED. Persistent metabolic acidosis with AG. Inciting event could be gastroenteritis(N/V) vs failure to administrate medication accurately (patient states she is taking different than prescribed).  - Labs today: K 4.6, CO2 27, AG 20  - CXR- no acute cardiopulmonary disease, renal calculus right kidney - BMET q4 hrs until AG closed - continue insulin drip and once AG closed will transition to subq insulin - fluids d5 1/2NS  - Follow CBGs and electrolytes - CBGs 140-180  Normocytic anemia of chronic disease: Hbg 10.5 today. B/l appears to be around 10.  - gastric occult blood positive - will start PPI due to concern for stress ulcer. -However many reasons why patient could be bleeding so will consult GI for EGD for source of bleed. -continue epo  # Abdominal Pain: No bowel movement x1 week. RUQ pain and tender to palpation, no concern for acute abdomen. No fevers noted. CT renal w/o contrast negative. UA not consistent with UTI. WBC 7.6 on admission. Pain could be associated with pancreatitis vs gallbladder etiology based off location.   - abdominal US unremarkable - will perform rectal exam to feel for impaction - Acetaminophen PRN pain and fever - Morphine 4mg  q4hr PRN pain - Zofran PRN nausea - Initiate bowel regimen, colace/senna - CT renal -  fluid overload with mild ascites and mild anasarca, fatty infiltration of liver  # ESRD- Receives dialysis on Tuesday, Thursday, Saturday. Was unable to receive dialysis prior to admission today. - HD today - Creatinine 13.54 on admission. Now 9.00. - hyponatremia corrected on HD - continue ESA - Nephrology consulted. Appreciate recommendations.  # Hypothyroidism: Last TSH 3.49 (06/2014). - Continue home medication of Levothyroxine  # HTN: BP 207/100 in ED. Home medications include Amlodipine 10mg , Coreg 25mg  BID, and Lisinopril 20mg  BID. Pressures stable. - Continue Amlodipine, Coreg, and Lisinopril   FEN/GI: Clear liquid diet; KVO d5 1/2NS Prophylaxis: Subcutaneous Heparin   Disposition: Continue current management on SDU. Pending improvement and stabilization.  Subjective:  Patient says she is much improved this morning. She states that she feels very dry and that her throat is hurting. Patient requesting if she can drink or have ice chips. She denies any more episodes of vomiting but is dry heaving. Abdominal pain still present.   Objective: Temp:  [98.2 F (36.8 C)-99.3 F (37.4 C)] 99.3 F (37.4 C) (01/21 0500) Pulse Rate:  [85-111] 85 (01/20 1711) Resp:  [13-24] 13 (01/20 1711) BP: (125-137)/(58-72) 125/58 mmHg (01/20 1711) SpO2:  [100 %] 100 % (01/20 1711) Physical Exam: General: NAD, comfortable lying in bed, appears frustrated HEENT: Dry mucous membranes. Normocephalic. Blind.   Cardiovascular: RRR. S1 and S2 noted. Murmur appreciated. Respiratory: Clear to auscultation bilaterally.  No wheezes/rales/rhonchi. Comfortable work of breathing. Abdomen: Hypoactive bowel sounds. Epigastric tenderness. No rebound tenderness noted. No guarding. Soft.   Extremities: No edema noted. Pulses palpated. HD site on R leg. Skin: No rashes noted. Warm. Neuro: A&O. No focal deficits.  Laboratory: Results for orders placed or performed during the hospital encounter of 10/24/14 (from the past 24 hour(s))  Glucose, capillary     Status: Abnormal   Collection Time: 10/25/14  8:06 AM  Result Value Ref Range   Glucose-Capillary 178 (H) 70 - 99 mg/dL  Hemoglobin Z6X     Status: Abnormal   Collection Time: 10/25/14  9:30 AM  Result Value Ref Range   Hgb A1c MFr Bld 6.8 (H) <5.7 %   Mean Plasma Glucose 148 (H) <117 mg/dL  Glucose, capillary     Status: Abnormal   Collection Time: 10/25/14 10:25 AM  Result Value Ref Range   Glucose-Capillary 164 (H) 70 - 99 mg/dL  Basic metabolic panel     Status: Abnormal   Collection Time: 10/25/14 10:54 AM  Result Value Ref Range   Sodium 138 135 - 145 mmol/L   Potassium 4.6 3.5 - 5.1 mmol/L   Chloride 94 (L) 96 - 112 mEq/L   CO2 7 (LL) 19 - 32 mmol/L   Glucose, Bld 171 (H) 70 - 99 mg/dL   BUN 37 (H) 6 - 23 mg/dL   Creatinine, Ser 0.96 (H) 0.50 - 1.10 mg/dL   Calcium 7.9 (L) 8.4 - 10.5 mg/dL   GFR calc non Af Amer 5 (L) >90 mL/min   GFR calc Af Amer 6 (L) >90 mL/min   Anion gap 37 (H) 5 - 15  Glucose, capillary     Status: Abnormal   Collection Time: 10/25/14 11:51 AM  Result Value Ref Range   Glucose-Capillary 154 (H) 70 - 99 mg/dL   Comment 1 Notify RN   Glucose, capillary     Status: Abnormal   Collection Time: 10/25/14 12:54 PM  Result Value Ref Range   Glucose-Capillary 150 (H) 70 - 99 mg/dL   Comment 1 Notify RN   Occult blood gastric / duodenum     Status: Abnormal   Collection Time: 10/25/14 12:55 PM  Result Value Ref Range   pH, Gastric NOT DONE    Occult Blood, Gastric POSITIVE (A) NEGATIVE  Glucose, capillary     Status: Abnormal   Collection Time: 10/25/14  1:58 PM  Result Value Ref Range   Glucose-Capillary 144 (H) 70 - 99 mg/dL  Basic metabolic panel     Status: Abnormal    Collection Time: 10/25/14  2:15 PM  Result Value Ref Range   Sodium 139 135 - 145 mmol/L   Potassium 3.9 3.5 - 5.1 mmol/L   Chloride 95 (L) 96 - 112 mEq/L   CO2 13 (L) 19 - 32 mmol/L   Glucose, Bld 142 (H) 70 - 99 mg/dL   BUN 38 (H) 6 - 23 mg/dL   Creatinine, Ser 0.45 (H) 0.50 - 1.10 mg/dL   Calcium 7.8 (L) 8.4 - 10.5 mg/dL   GFR calc non Af Amer 5 (L) >90 mL/min   GFR calc Af Amer 6 (L) >90 mL/min   Anion gap 31 (H) 5 - 15  Glucose, capillary     Status: Abnormal   Collection Time: 10/25/14  3:02 PM  Result Value Ref Range   Glucose-Capillary 141 (H) 70 - 99 mg/dL   Comment 1 Notify RN   Glucose, capillary  Status: Abnormal   Collection Time: 10/25/14  4:07 PM  Result Value Ref Range   Glucose-Capillary 157 (H) 70 - 99 mg/dL   Comment 1 Notify RN   Glucose, capillary     Status: Abnormal   Collection Time: 10/25/14  5:10 PM  Result Value Ref Range   Glucose-Capillary 184 (H) 70 - 99 mg/dL  Glucose, capillary     Status: Abnormal   Collection Time: 10/25/14  6:23 PM  Result Value Ref Range   Glucose-Capillary 151 (H) 70 - 99 mg/dL  Basic metabolic panel     Status: Abnormal   Collection Time: 10/25/14  6:48 PM  Result Value Ref Range   Sodium 136 135 - 145 mmol/L   Potassium 3.4 (L) 3.5 - 5.1 mmol/L   Chloride 93 (L) 96 - 112 mEq/L   CO2 23 19 - 32 mmol/L   Glucose, Bld 162 (H) 70 - 99 mg/dL   BUN 41 (H) 6 - 23 mg/dL   Creatinine, Ser 1.61 (H) 0.50 - 1.10 mg/dL   Calcium 7.6 (L) 8.4 - 10.5 mg/dL   GFR calc non Af Amer 5 (L) >90 mL/min   GFR calc Af Amer 5 (L) >90 mL/min   Anion gap 20 (H) 5 - 15  Glucose, capillary     Status: Abnormal   Collection Time: 10/25/14  7:43 PM  Result Value Ref Range   Glucose-Capillary 154 (H) 70 - 99 mg/dL  Glucose, capillary     Status: Abnormal   Collection Time: 10/25/14  8:53 PM  Result Value Ref Range   Glucose-Capillary 152 (H) 70 - 99 mg/dL  Glucose, capillary     Status: Abnormal   Collection Time: 10/25/14 10:02 PM   Result Value Ref Range   Glucose-Capillary 131 (H) 70 - 99 mg/dL  Basic metabolic panel     Status: Abnormal   Collection Time: 10/25/14 10:03 PM  Result Value Ref Range   Sodium 139 135 - 145 mmol/L   Potassium 3.2 (L) 3.5 - 5.1 mmol/L   Chloride 92 (L) 96 - 112 mEq/L   CO2 25 19 - 32 mmol/L   Glucose, Bld 159 (H) 70 - 99 mg/dL   BUN 41 (H) 6 - 23 mg/dL   Creatinine, Ser 09.60 (H) 0.50 - 1.10 mg/dL   Calcium 7.5 (L) 8.4 - 10.5 mg/dL   GFR calc non Af Amer 5 (L) >90 mL/min   GFR calc Af Amer 5 (L) >90 mL/min   Anion gap 22 (H) 5 - 15  Glucose, capillary     Status: Abnormal   Collection Time: 10/25/14 10:56 PM  Result Value Ref Range   Glucose-Capillary 147 (H) 70 - 99 mg/dL  Glucose, capillary     Status: Abnormal   Collection Time: 10/26/14 12:09 AM  Result Value Ref Range   Glucose-Capillary 146 (H) 70 - 99 mg/dL  Glucose, capillary     Status: Abnormal   Collection Time: 10/26/14  1:17 AM  Result Value Ref Range   Glucose-Capillary 162 (H) 70 - 99 mg/dL  Glucose, capillary     Status: Abnormal   Collection Time: 10/26/14  2:25 AM  Result Value Ref Range   Glucose-Capillary 155 (H) 70 - 99 mg/dL  Basic metabolic panel     Status: Abnormal   Collection Time: 10/26/14  3:25 AM  Result Value Ref Range   Sodium 138 135 - 145 mmol/L   Potassium 3.7 3.5 - 5.1 mmol/L   Chloride 93 (  L) 96 - 112 mEq/L   CO2 25 19 - 32 mmol/L   Glucose, Bld 166 (H) 70 - 99 mg/dL   BUN 43 (H) 6 - 23 mg/dL   Creatinine, Ser 16.1010.59 (H) 0.50 - 1.10 mg/dL   Calcium 7.6 (L) 8.4 - 10.5 mg/dL   GFR calc non Af Amer 4 (L) >90 mL/min   GFR calc Af Amer 5 (L) >90 mL/min   Anion gap 20 (H) 5 - 15  Glucose, capillary     Status: Abnormal   Collection Time: 10/26/14  3:34 AM  Result Value Ref Range   Glucose-Capillary 157 (H) 70 - 99 mg/dL    Imaging/Diagnostic Tests: Dg Abd Acute W/chest 10/24/2014   IMPRESSION: NO ACTIVE CARDIOPULMONARY DISEASE  NORMAL BOWEL GAS PATTERN.  POSSIBLE SMALL LEFT RENAL  CALCULUS.     Ct Renal Stone Study 10/24/2014  IMPRESSION: Findings consistent with fluid overload with mild amount of ascites and mild anasarca.  Fatty infiltration of liver.   Electronically Signed   By: Marlan Palauharles  Clark M.D.   On: 10/24/2014 13:16   Pincus LargeJazma Y Diarra Ceja, DO 10/26/2014, 7:33 AM PGY-1, [employer] Family Medicine FPTS Intern pager: [phone], text pages welcome

## 2014-10-27 DIAGNOSIS — E1310 Other specified diabetes mellitus with ketoacidosis without coma: Secondary | ICD-10-CM

## 2014-10-27 LAB — BASIC METABOLIC PANEL
ANION GAP: 13 (ref 5–15)
Anion gap: 6 (ref 5–15)
Anion gap: 10 (ref 5–15)
BUN: 12 mg/dL (ref 6–23)
BUN: 13 mg/dL (ref 6–23)
BUN: 17 mg/dL (ref 6–23)
CALCIUM: 8.5 mg/dL (ref 8.4–10.5)
CHLORIDE: 101 meq/L (ref 96–112)
CO2: 28 mmol/L (ref 19–32)
CO2: 26 mmol/L (ref 19–32)
CO2: 21 mmol/L (ref 19–32)
CREATININE: 5.98 mg/dL — AB (ref 0.50–1.10)
Calcium: 8.7 mg/dL (ref 8.4–10.5)
Calcium: 9.1 mg/dL (ref 8.4–10.5)
Chloride: 98 mEq/L (ref 96–112)
Chloride: 97 mmol/L (ref 96–112)
Creatinine, Ser: 5.32 mg/dL — ABNORMAL HIGH (ref 0.50–1.10)
Creatinine, Ser: 6.84 mg/dL — ABNORMAL HIGH (ref 0.50–1.10)
GFR calc Af Amer: 11 mL/min — ABNORMAL LOW (ref >90)
GFR calc non Af Amer: 9 mL/min — ABNORMAL LOW (ref >90)
GFR calc non Af Amer: 7 mL/min — ABNORMAL LOW (ref >90)
GFR, EST AFRICAN AMERICAN: 10 mL/min — AB (ref >90)
GFR, EST AFRICAN AMERICAN: 8 mL/min — AB (ref >90)
GFR, EST NON AFRICAN AMERICAN: 10 mL/min — AB (ref >90)
GLUCOSE: 132 mg/dL — AB (ref 70–99)
Glucose, Bld: 100 mg/dL — ABNORMAL HIGH (ref 70–99)
Glucose, Bld: 299 mg/dL — ABNORMAL HIGH (ref 70–99)
Potassium: 4.8 mmol/L (ref 3.5–5.1)
Potassium: 5.1 mmol/L (ref 3.5–5.1)
Potassium: 7.2 mmol/L (ref 3.5–5.1)
Sodium: 132 mmol/L — ABNORMAL LOW (ref 135–145)
Sodium: 137 mmol/L (ref 135–145)
Sodium: 131 mmol/L — ABNORMAL LOW (ref 135–145)

## 2014-10-27 LAB — GLUCOSE, CAPILLARY
GLUCOSE-CAPILLARY: 103 mg/dL — AB (ref 70–99)
GLUCOSE-CAPILLARY: 111 mg/dL — AB (ref 70–99)
GLUCOSE-CAPILLARY: 179 mg/dL — AB (ref 70–99)
Glucose-Capillary: 125 mg/dL — ABNORMAL HIGH (ref 70–99)
Glucose-Capillary: 147 mg/dL — ABNORMAL HIGH (ref 70–99)
Glucose-Capillary: 272 mg/dL — ABNORMAL HIGH (ref 70–99)
Glucose-Capillary: 265 mg/dL — ABNORMAL HIGH (ref 70–99)
Glucose-Capillary: 234 mg/dL — ABNORMAL HIGH (ref 70–99)
Glucose-Capillary: 99 mg/dL (ref 70–99)
Glucose-Capillary: 109 mg/dL — ABNORMAL HIGH (ref 70–99)
Glucose-Capillary: 287 mg/dL — ABNORMAL HIGH (ref 70–99)
Glucose-Capillary: 113 mg/dL — ABNORMAL HIGH (ref 70–99)

## 2014-10-27 LAB — CBC
HEMATOCRIT: 30.7 % — AB (ref 36.0–46.0)
HEMOGLOBIN: 10.5 g/dL — AB (ref 12.0–15.0)
MCH: 30.7 pg (ref 26.0–34.0)
MCHC: 34.2 g/dL (ref 30.0–36.0)
MCV: 89.8 fL (ref 78.0–100.0)
Platelets: 164 10*3/uL (ref 150–400)
RBC: 3.42 MIL/uL — AB (ref 3.87–5.11)
RDW: 13.8 % (ref 11.5–15.5)
WBC: 5.2 10*3/uL (ref 4.0–10.5)

## 2014-10-27 LAB — TROPONIN I
Troponin I: <0.03 ng/mL (ref <0.031)
Troponin I: <0.03 ng/mL (ref <0.031)

## 2014-10-27 MED ORDER — INSULIN GLARGINE 100 UNIT/ML ~~LOC~~ SOLN
7.0000 [IU] | Freq: Two times a day (BID) | SUBCUTANEOUS | Status: DC
  Administered 2014-10-27 (×2): 7 [IU] via SUBCUTANEOUS
  Filled 2014-10-27 (×3): qty 0.07

## 2014-10-27 MED ORDER — CALCIUM CARBONATE ANTACID 500 MG PO CHEW
1.0000 | CHEWABLE_TABLET | Freq: Two times a day (BID) | ORAL | Status: DC | PRN
  Filled 2014-10-27: qty 1

## 2014-10-27 MED ORDER — HEPARIN SODIUM (PORCINE) 1000 UNIT/ML DIALYSIS
20.0000 [IU]/kg | INTRAMUSCULAR | Status: DC | PRN
  Filled 2014-10-27: qty 2

## 2014-10-27 MED ORDER — INSULIN ASPART 100 UNIT/ML ~~LOC~~ SOLN
0.0000 [IU] | Freq: Three times a day (TID) | SUBCUTANEOUS | Status: DC
  Administered 2014-10-27: 5 [IU] via SUBCUTANEOUS
  Administered 2014-10-28 (×2): 2 [IU] via SUBCUTANEOUS
  Administered 2014-10-29: 1 [IU] via SUBCUTANEOUS
  Administered 2014-10-29: 2 [IU] via SUBCUTANEOUS

## 2014-10-27 MED ORDER — SODIUM CHLORIDE 0.9 % IV SOLN: INTRAVENOUS | Status: AC

## 2014-10-27 MED ORDER — PANTOPRAZOLE SODIUM 40 MG IV SOLR
40.0000 mg | Freq: Two times a day (BID) | INTRAVENOUS | Status: DC
  Administered 2014-10-27 (×2): 40 mg via INTRAVENOUS
  Filled 2014-10-27 (×4): qty 40

## 2014-10-27 NOTE — Discharge Summary (Signed)
Family Medicine Teaching [facility] Discharge Summary  Patient name: [name] Medical record number: [id number] Date of birth: [birth year] Age: 34 y.o. Gender: female Date of Admission: 10/24/2014  Date of Discharge: 10/29/2014 Admitting Physician: Janit Pagan, MD  Primary Care Provider: Hazeline Junker, MD Consultants: Nephrology, GI  Indication for Hospitalization: DKA  Discharge Diagnoses/Problem List:   Patient Active Problem List   Diagnosis Date Noted  . Constipation   . Abdominal pain   . Hyponatremia   . Nausea with vomiting   . Type 1 diabetes mellitus with complications   . Hypertensive urgency   . ESRD on dialysis   . Hyperkalemia   . Hyperglycemia   . Abdominal pain, acute   . Diabetic ketoacidosis without coma associated with diabetes mellitus due to underlying condition   . DKA (diabetic ketoacidoses) 10/24/2014  . Major depressive disorder, recurrent, severe without psychotic features   . ESRD (end stage renal disease) 09/27/2014  . Hip pain   . Recurrent major depression-severe 09/26/2014  . Suicidal behavior 09/25/2014  . Suicidal ideation 09/25/2014  . Peripheral polyneuropathy 09/19/2014  . HTN (hypertension), malignant 06/29/2014  . Abnormal EKG 06/29/2014  . Chest pain with moderate risk of acute coronary syndrome 06/28/2014  . Atypical chest pain 06/28/2014  . ESRD (end stage renal disease) on dialysis 11/11/2013  . Hypothyroidism 06/10/2013  . Diabetic retinopathy-Blind 04/21/2013  . Essential hypertension, benign 04/21/2013  . DVT of upper extremity (deep vein thrombosis) 04/02/2013  . DM (diabetes mellitus), type 1 04/02/2013    Disposition: Home  Discharge Condition: Improved  Discharge Exam:  Blood pressure 171/90, pulse 84, temperature 98.6 F (37 C), temperature source Oral, resp. rate 16, height  (1.6 m), weight 131 lb 6.3 oz (59.6 kg), last menstrual period 10/16/2014, SpO2 99 %.  General: NAD, lying in bed in  NAD HEENT: Normocephalic. Blind.  Cardiovascular: RRR. S1 and S2 noted. Systolic murmur present (baseline) Respiratory: CTAB, no wheezes, normal WOB Abdomen: BS+, remains with epigastric tenderness but improved, no rebound or guarding; soft throughout. Extremities: warm, well-perfused, no LE edema Skin: No rash appreciated Neuro: A&O. No focal deficits.   Brief Hospital Course:  [name] is a 34 y.o. female who presented with DKA secondary to vomiting. PMH is significant for type 1 diabetes mellitus, diabetic retinopathy causing blindness, ESRD, HTN, hypothyroidism, peripheral polyneuropathy, and h/o DVT of UE.  DKA in setting of T1DM- Patient was not taking insulin and is known to be a brittle diabetic with rapid changes in blood glucose. Patient was started on insulin drip. Labs were consistent with a metabolic acidosis with anion gap. Inciting event could be gastroenteritis(N/V) vs failure to administrate medication accurately (patient states she is taking different than prescribed). Insulin drip was transitioned to Lantus 10 units twice a day plus 7 units Novolog  3 times a day. She was sent home on this regimen. At time of discharge AG closed and DKA had resolved.  Normocytic anemia of chronic disease: Patient's hemoglobin remained stable throughout admission. Patient was noted to have gastric occult blood positive emesis. She was started on a PPI for concerns of esophagitis. Patient has ESRD and epo was continued.  Abdominal Pain: Abdominal pain was in the epigastric region. Along with her other symptoms that suggested to [place] gastritis or esophagitis. Patient had no fevers. Patient was started on a bowel regimen since she had not had a bowel movement in a week. Abdominal US unremarkable  ESRD- Patient continued to receive dialysis during this  hospitalization. Nephrology followed along.   Hypothyroidism: Continued home medication of Levothyroxine .  Psych hx: recently  discharged on 12/24 to [facility] inpt due to suicidal ideation (refusing HD, insulin). No notes visible from this admit. Denied SI this admission.   Issues for Follow Up:  1. Recheck TSH level  2. Monitor Insulin regimen and adjust - patient known to not take as instructed 3. Follow-up mental status   4. Consider endocrinology referral to help with management of diabetes   Significant Procedures: None  Significant Labs and Imaging:   Recent Labs Lab 10/27/14 0628 10/28/14 0226 10/29/14 0120  WBC 5.2 3.4* 4.2  HGB 10.5* 10.3* 10.7*  HCT 30.7* 29.9* 30.6*  PLT 164 138* 150    Recent Labs Lab 10/24/14 0945  10/27/14 0127 10/27/14 0541 10/27/14 1323 10/28/14 0226 10/29/14 0120  NA 123*  < > 132* 137 131* 133* 128*  K 4.7  < > 4.8 5.1 7.2* 5.2* 4.5  CL 77*  < > 98 101 97 98 92*  CO2 13*  < > GLUCOSE 299*  < > 132* 100* 299* 261* 563*  BUN 56*  < > CREATININE 12.68*  < > 5.32* 5.98* 6.84* 4.83* 5.39*  CALCIUM 9.2  < > 8.5 8.7 9.1 8.6 8.3*  PHOS  --   --   --   --   --  2.6  --   ALKPHOS 100  --   --   --   --   --   --   AST 24  --   --   --   --   --   --   ALT 22  --   --   --   --   --   --   ALBUMIN 4.4  --   --   --   --  3.0*  --   < > = values in this interval not displayed.  Dg Abd Acute W/chest 10/24/2014 IMPRESSION: NO ACTIVE CARDIOPULMONARY DISEASE NORMAL BOWEL GAS PATTERN. POSSIBLE SMALL LEFT RENAL CALCULUS.   Ct Renal Stone Study 10/24/2014 IMPRESSION: Findings consistent with fluid overload with mild amount of ascites and mild anasarca. Fatty infiltration of liver. Electronically Signed By: Marlan Palau M.D. On: 10/24/2014 13:16   U/S Abd 1/21 negative  Results/Tests Pending at Time of Discharge: None  Discharge Medications:    Medication List    STOP taking these medications        Insulin Glargine 100 UNIT/ML Solostar Pen  Commonly known as:  LANTUS SOLOSTAR  Replaced by:  insulin  glargine 100 UNIT/ML injection     isosorbide mononitrate 30 MG 24 hr tablet  Commonly known as:  IMDUR      TAKE these medications        amLODipine 10 MG tablet  Commonly known as:  NORVASC  Take 10 mg by mouth 2 (two) times daily.     aspirin 81 MG chewable tablet  Chew 1 tablet (81 mg total) by mouth daily.     carvedilol 25 MG tablet  Commonly known as:  COREG  Take 25 mg by mouth every morning.     gabapentin 300 MG capsule  Commonly known as:  NEURONTIN  Take 1 capsule (300 mg total) by mouth daily.     insulin aspart 100 UNIT/ML injection  Commonly known as:  novoLOG  Inject 7 Units into the skin 3 (three)  times daily with meals.     insulin glargine 100 UNIT/ML injection  Commonly known as:  LANTUS  Inject 0.1 mLs (10 Units total) into the skin 2 (two) times daily.     levothyroxine 100 MCG tablet  Commonly known as:  SYNTHROID, LEVOTHROID  Take 1 tablet by mouth  daily     lisinopril 20 MG tablet  Commonly known as:  PRINIVIL,ZESTRIL  Take 20 mg by mouth 2 (two) times daily.     multivitamin Tabs tablet  Take 1 tablet by mouth at bedtime.     pantoprazole 40 MG tablet  Commonly known as:  PROTONIX  Take 1 tablet (40 mg total) by mouth daily.        Discharge Instructions: Please refer to Patient Instructions section of EMR for full details.  Patient was counseled important signs and symptoms that should prompt return to medical care, changes in medications, dietary instructions, activity restrictions, and follow up appointments.   Follow-Up Appointments: Follow-up Information    Follow up with Hazeline JunkerGrunz, Ryan, MD. Schedule an appointment as soon as possible for a visit in 1 week.   Specialty:  Family Medicine   Why:  For hospital follow-up   Contact information:   [address] [place] [address] [phone]       Pincus LargeJazma Y Shannin Naab, DO 10/29/2014, 9:29 PM PGY-1, [facility]

## 2014-10-27 NOTE — Progress Notes (Signed)
Family Practice Teaching Service Interval Progress Note  Repeat BMET with K of 7.2. T waves appear peaked on EKG. Called and spoke with Dr. Arrie Aranoladonato who states he will get pt to dialysis now. Nurse updated of plan via phone.  Also of note, troponin negative.  Susan FeinsteinBrittany Kaevion Sinclair, MD Family Medicine PGY-3 Service Pager [phone]

## 2014-10-27 NOTE — Progress Notes (Signed)
Inpatient Diabetes Program Recommendations  AACE/ADA: New Consensus Statement on Inpatient Glycemic Control (2013)  Target Ranges:  Prepandial:   less than 140 mg/dL      Peak postprandial:   less than 180 mg/dL (1-2 hours)      Critically ill patients:  140 - 180 mg/dL   Reason for Assessment:Results for Susan RossettiMONROE, Lachrisha (MRN [id number]) as of 10/27/2014 10:20  Ref. Range 10/26/2014 22:09 10/26/2014 23:26 10/27/2014 00:32 10/27/2014 01:37 10/27/2014 02:44 10/27/2014 07:59  Glucose-Capillary Latest Range: 70-99 mg/dL 409265 (H) 811234 (H) 914179 (H) 111 (H) 103 (H) 109 (H)    Diabetes history: Type 1 diabetes Outpatient Diabetes medications: Lantus 14 units bid-(patient states she was only taking Novolog) Current orders for Inpatient glycemic control: Novolog sensitive tid with meals  Note that Lantus 7 units bid started on 1/21 after insulin drip however now discontinued.  Consider restart of Lantus 5 units bid so that patient will have low dose basal insulin on board.    Thanks, Beryl MeagerJenny Reanna Scoggin, RN, BC-ADM Inpatient Diabetes Coordinator Pager [phone]

## 2014-10-27 NOTE — Progress Notes (Signed)
Daily Rounding Note  10/27/2014, 8:27 AM  LOS: 3 days   SUBJECTIVE:       Good sized BM after enema yesterday, her first in several days. No vomiting for ~ 24 hours. Still with esophageal burning/spasm, now mostly with po's (clears) and overall this is better.  Still some burning pain in RUQ  OBJECTIVE:         Vital signs in last 24 hours:    Temp:  [98 F (36.7 C)-99.3 F (37.4 C)] 98.4 F (36.9 C) (01/22 0800) Pulse Rate:  [70-84] 75 (01/22 0800) Resp:  [11-18] 17 (01/22 0800) BP: (146-174)/(75-90) 159/75 mmHg (01/22 0800) SpO2:  [97 %-100 %] 100 % (01/22 0800) Weight:  [132 lb 11.5 oz (60.2 kg)-133 lb 2.5 oz (60.4 kg)] 132 lb 11.5 oz (60.2 kg) (01/21 1704) Last BM Date: 10/26/14 Filed Weights   10/24/14 2246 10/26/14 1256 10/26/14 1704  Weight: 131 lb 6.3 oz (59.6 kg) 133 lb 2.5 oz (60.4 kg) 132 lb 11.5 oz (60.2 kg)   General: looks well, comfortable   Heart: RRR Chest: clear bil.  No cough, no dyspnea Abdomen: soft, ND.  Minor RUQ/epigastric tenderness  Extremities: no CCE Neuro/Psych:  Pleasant, oriented x 3.  No gross deficits except blindness.   Intake/Output from previous day: 01/21 0701 - 01/22 0700 In: 650 [P.O.:480; I.V.:170] Out: 206 [Urine:200]  Intake/Output this shift:    Lab Results:  Recent Labs  10/25/14 0344 10/26/14 0731 10/27/14 0628  WBC 8.8 7.3 5.2  HGB 11.4* 10.5* 10.5*  HCT 32.5* 29.5* 30.7*  PLT 248 197 164   BMET  Recent Labs  10/26/14 2215 10/27/14 0127 10/27/14 0541  NA 133* 132* 137  K 4.9 4.8 5.1  CL 96 98 101  CO2 25 28 26   GLUCOSE 298* 132* 100*  BUN 11 12 13   CREATININE 5.19* 5.32* 5.98*  CALCIUM 8.3* 8.5 8.7   LFT  Recent Labs  10/24/14 0945  PROT 8.6*  ALBUMIN 4.4  AST 24  ALT 22  ALKPHOS 100  BILITOT 2.0*   PT/INR No results for input(s): LABPROT, INR in the last 72 hours. Hepatitis Panel No results for input(s): HEPBSAG, HCVAB, HEPAIGM,  HEPBIGM in the last 72 hours.  Studies/Results: [place] Abdomen Complete  10/26/2014   CLINICAL DATA:  Acute abdominal pain  EXAM: ULTRASOUND ABDOMEN COMPLETE  COMPARISON:  Abdominal CT from yesterday  FINDINGS: Gallbladder: No gallstones or wall thickening visualized. No sonographic Murphy sign noted.  Common bile duct: Diameter: 4 mm  Liver: No focal lesion identified. Within normal limits in parenchymal echogenicity, but there is steatosis on preceding abdominal CT.  IVC: No abnormality visualized.  Pancreas: Visualized portion unremarkable.  Spleen: Size and appearance within normal limits.  Right Kidney: Length: 10 cm. Abnormal cortical echogenicity consistent with end-stage renal disease. No hydronephrosis.  Left Kidney: Length: 9 cm. No hydronephrosis. There is an 11 mm cyst in the interpolar region.  Abdominal aorta: No aneurysm visualized.  Other findings: None.  IMPRESSION: No findings to explain abdominal pain.   Electronically Signed   By: Tiburcio PeaJonathan  Watts M.D.   On: 10/26/2014 06:25   Scheduled Meds: . amLODipine  10 mg Oral QHS  . antiseptic oral rinse  7 mL Mouth Rinse BID  . carvedilol  25 mg Oral BID WC  . darbepoetin (ARANESP) injection - DIALYSIS  100 mcg Intravenous Q Thu-HD  . docusate sodium  100 mg Oral BID  .  doxercalciferol  3 mcg Intravenous Q T,Th,Sa-HD  . heparin  5,000 Units Subcutaneous 3 times per day  . insulin aspart  0-9 Units Subcutaneous TID WC  . insulin glargine  7 Units Subcutaneous BID  . levothyroxine  100 mcg Oral QAC breakfast  . lisinopril  20 mg Oral BID  . pantoprazole  40 mg Oral BID  . potassium chloride  40 mEq Oral BID  . senna  1 tablet Oral QHS  . sodium chloride  3 mL Intravenous Q12H  . sodium chloride  3 mL Intravenous Q12H   Continuous Infusions: . dextrose 5 % and 0.45% NaCl 10 mL/hr at 10/26/14 1131   PRN Meds:.sodium chloride, acetaminophen **OR** acetaminophen, dextrose, morphine injection, ondansetron **OR** ondansetron (ZOFRAN)  IV   ASSESMENT:   *  N/V in type 1 diabetic with multiple diabetic complications, admitted with DKA.  Presume diabetic gastroparesis. Negative ultrasound and unremarkable abdominal xray.  On oral BID PPI, prn Reglan, prn Zofran.   *  ESRD.    *  Chronic anemia.  On Aranesp weekly.    PLAN   *  Switch to IV BID PPI.   *  ? Add empiric Diflucan for coverage in case she has esophageal candidiasis.   *  For now pt prefers to stay with prn antiemetics and not schedule antiemetics    Jennye Moccasin  10/27/2014, 8:27 AM Pager: [phone]  GI ATTENDING  Interval history and data reviewed. Patient in dialysis. Agree with interval progress note. Feeling better after bowel movement. No recent vomiting. Continue with supportive care as previously recommended (correction of DKA, twice a day PPI, promotility agent, and antiemetics). Advance diet as tolerated. No role for endoscopy at this time. If patient's problems persist into early next week despite the above measures, then GI reevaluation would be appropriate. Will sign off at this time, however. Thank you  Wilhemina Bonito. Eda Keys., M.D. [facility] Division of Gastroenterology

## 2014-10-27 NOTE — Progress Notes (Signed)
Family Medicine Teaching Service Daily Progress Note Intern Pager: [phone]  Patient name: [name] Medical record number: [id number] Date of birth: 02/26/1981 Age: 34 y.o. Gender: female  Primary Care Provider: Hazeline JunkerGrunz, Ryan, MD Consultants: Nephrology, GI Code Status: Full  Pt Overview and Major Events to Date:  1/19: Admitted for hyperglycemia/DKA  Assessment and Plan: Susan Rossettiameka Roznowski is a 34 y.o. female presenting with DKA secondary to vomiting. PMH is significant for type 1 diabetes mellitus, diabetic retinopathy causing blindness, ESRD on HD, HTN, hypothyroidism, peripheral polyneuropathy, and h/o DVT of UE.  # DKA in setting of T1DM- Home medications of diabetes include Novolog 1-3U daily, and Lantus 14U BID. Reported not taking medications for several days prior to admission. Brittle diabetic with rapid changes in blood glucose. Inciting event could be gastroenteritis(N/V) vs failure to administrate medication accurately (patient states she is taking different than prescribed). DKA resolved. Patient improving.  - AG closed (10)  and bicarb corrected (26) - patient transitioned off drip today and started on Lantus 7U BID with sensetive SSI. - Has received lantus 7 units at 3am and another 7 at 10am. As pt not on long acting insulin at home, will d/c further lantus and reassess sugars later today. Reorder lantus for tomorrow morning if sugars not dropping. - BMET this pm; then can space to daily - fluids KVO - Follow CBGs and electrolytes - prn antiemetic  # Normocytic anemia of chronic disease: Hbg 10.5 today. B/l appears to be around 10.  - gastric occult blood positive - continue PPI BID due to concern for esophagitis.  - continue epo per renal  # Abdominal Pain: No bowel movement x1 week prior to admission. RUQ pain and tender to palpation, however no concern for acute abdomen at this time. No fevers noted. CT renal w/o contrast negative (fluid overload with mild ascites and  mild anasarca, fatty infiltration of liver). UA not consistent with UTI. WBC 7.6 on admission. Pain could be associated with pancreatitis vs gallbladder etiology based on location but abd US unremarkable and lipase normal - has had a bowel movement. Defer rectal at this moment and continue to monitor stool output. - Acetaminophen PRN pain and fever - d/c morphine as pt on HD, avoid accumulation of metabolites - Zofran PRN nausea - Initiate bowel regimen, colace/senna - EKG and troponin x 2 to r/o ACS as cause of epigastric/esophageal-type pain - add tums for pain relief - GI consulted and recommended no EGD, just PPI  # ESRD- Receives dialysis on Tuesday, Thursday, Saturday. Missed outpt HD on day of admit but taken to HD on evening of admission. Marked hyponatremia on admit, resolved - HD yesterday, next session on Saturday - Creatinine 13.54 on admission. Now 6. - continue ESA per renal - Nephrology consulted. Appreciate recommendations. - K 5.1 today, had been receiving PO supplementation while on insulin & dextrose, d/c this today and repeat BMET at 2pm  # Hypothyroidism: Last TSH 3.49 (06/2014). - Continue home medication of Levothyroxine 100mcg daily - recommend outpt recheck of TSH in a couple months (avoid now due to acute illness)  # HTN: BP 207/100 in ED. Home medications include Amlodipine 10mg , Coreg 25mg  BID, and Lisinopril 20mg  BID. Pressures stable, elevated slightly with systolics 140's-low 160's currently.  - Unclear why on lisinopril or if this is even efficacious in an ESRD pt on HD? - Continue Amlodipine, Coreg, and Lisinopril for now, clarify if should continue lisin  # Psych hx: recently discharged on 12/24 to [facility] inpt due to suicidal ideation (refusing HD, insulin). No notes visible from this admit - discuss mood control/screen for any further SI prior to discharge once feeling better (not able to discuss with pt today given intensity of abd  pain)  FEN/GI: Clear liquid diet; KVO d5 1/2NS Prophylaxis: Subcutaneous Heparin   Disposition: possible transfer to tele floor today (await this PM - first eval sugars, EKG, trops, 2p BMET prior to deciding if stable for tele)  Subjective:  Having abd pain still. Severe esophageal pain in chest and epigastric area. Trying to take PO as able.  Objective: Temp:  [98 F (36.7 C)-99.3 F (37.4 C)] 98.4 F (36.9 C) (01/22 0800) Pulse Rate:  [70-84] 75 (01/22 0800) Resp:  [11-18] 17 (01/22 0800) BP: (146-174)/(75-90) 159/75 mmHg (01/22 0800) SpO2:  [97 %-100 %] 100 % (01/22 0800) Weight:  [132 lb 11.5 oz (60.2 kg)-133 lb 2.5 oz (60.4 kg)] 132 lb 11.5 oz (60.2 kg) (01/21 1704) Physical Exam: General: NAD, lying in bed asleep and awakens to voice then becomes very uncomfortable with abd/chest discomfort HEENT:  Normocephalic. Blind.   Cardiovascular: RRR. S1 and S2 noted. Murmur appreciated. Respiratory: Clear to auscultation bilaterally. No wheezes/rales/rhonchi. Comfortable work of breathing. Abdomen: Very active bowel sounds. Moderate epigastric tenderness. No rebound tenderness noted. No guarding. Soft.  Extremities: No edema noted. Calves nontender to palpation. Skin: No rashes noted. Warm. Neuro: A&O. No focal deficits.  Laboratory: CBC  Recent Labs Lab 10/25/14 0344 10/26/14 0731 10/27/14 0628  WBC 8.8 7.3 5.2  HGB 11.4* 10.5* 10.5*  HCT 32.5* 29.5* 30.7*  PLT 248 197 164     BMET  Recent Labs Lab 10/26/14 2215 10/27/14 0127 10/27/14 0541  NA 133* 132* 137  K 4.9 4.8 5.1  CL 96 98 101  CO2 BUN CREATININE 5.19* 5.32* 5.98*  GLUCOSE 298* 132* 100*  CALCIUM 8.3* 8.5 8.7       Imaging/Diagnostic Tests: Dg Abd Acute W/chest 10/24/2014   IMPRESSION: NO ACTIVE CARDIOPULMONARY DISEASE  NORMAL BOWEL GAS PATTERN.  POSSIBLE SMALL LEFT RENAL CALCULUS.     Ct Renal Stone Study 10/24/2014  IMPRESSION: Findings consistent with fluid overload  with mild amount of ascites and mild anasarca.  Fatty infiltration of liver.   Electronically Signed   By: Marlan Palau M.D.   On: 10/24/2014 13:16   U/S Abd 1/21 negative  Latrelle Dodrill, MD 10/27/2014, 9:55 AM PGY-3, [facility] FPTS Intern pager: [phone], text pages welcome

## 2014-10-27 NOTE — Progress Notes (Signed)
Patient ID: Aden Youngman, female   DOB: Dec 16, 1980, 34 y.o.   MRN: [id number]  [facility] Progress Note    Subjective:   Feels a little better   Objective:   BP 159/75 mmHg  Pulse 75  Temp(Src) 98.4 F (36.9 C) (Oral)  Resp 17  Ht  (1.6 m)  Wt 60.2 kg (132 lb 11.5 oz)  BMI 23.52 kg/m2  SpO2 100%  LMP 10/16/2014  Intake/Output: I/O last 3 completed shifts: In: 750 [P.O.:480; I.V.:270] Out: 206 [Urine:200; Other:6]   Intake/Output this shift:    Weight change:   Physical Exam: Gen:WD WN AAF in NAD CVS:no rub Resp:cta WUJ:[id number] Ext:no edema, R thigh AVG +T/B  Labs: BMET  Recent Labs Lab 10/24/14 0945  10/25/14 2203 10/26/14 0325 10/26/14 0731 10/26/14 0939 10/26/14 2215 10/27/14 0127 10/27/14 0541  NA 123*  < > 139 138 138 139 133* 132* 137  K 4.7  < > 3.2* 3.7 3.3* 3.4* 4.9 4.8 5.1  CL 77*  < > 92* 93* 91* 92* 96 98 101  CO2 13*  < > GLUCOSE 299*  < > 159* 166* 169* 187* 298* 132* 100*  BUN 56*  < > 41* 43* 43* 40* CREATININE 12.68*  < > 10.07* 10.59* 10.82* 10.97* 5.19* 5.32* 5.98*  ALBUMIN 4.4  --   --   --   --   --   --   --   --   CALCIUM 9.2  < > 7.5* 7.6* 7.7* 7.8* 8.3* 8.5 8.7  < > = values in this interval not displayed. CBC  Recent Labs Lab 10/24/14 0945 10/25/14 0344 10/26/14 0731 10/27/14 0628  WBC 7.6 8.8 7.3 5.2  NEUTROABS 6.7  --   --   --   HGB 13.2 11.4* 10.5* 10.5*  HCT 36.2 32.5* 29.5* 30.7*  MCV 85.4 90.3 87.3 89.8  PLT 228 248 197 164    @ Medications:    . amLODipine  10 mg Oral QHS  . antiseptic oral rinse  7 mL Mouth Rinse BID  . carvedilol  25 mg Oral BID WC  . darbepoetin (ARANESP) injection - DIALYSIS  100 mcg Intravenous Q Thu-HD  . docusate sodium  100 mg Oral BID  . doxercalciferol  3 mcg Intravenous Q T,Th,Sa-HD  . heparin  5,000 Units Subcutaneous 3 times per day  . insulin aspart  0-9 Units Subcutaneous TID WC  . levothyroxine  100 mcg  Oral QAC breakfast  . lisinopril  20 mg Oral BID  . pantoprazole (PROTONIX) IV  40 mg Intravenous Q12H  . potassium chloride  40 mEq Oral BID  . senna  1 tablet Oral QHS  . sodium chloride  3 mL Intravenous Q12H  . sodium chloride  3 mL Intravenous Q12H   Dialysis Orders: Center: [facility] on TTS . EDW 61.0 kg ( leaving below edw past few txs and htn pre post hd ) HD Bath 1.0k, 2.5ca Time 4hrs Heparin 3000. Access R fem Avgg  Hectorol 3 mcg IV/HD Aranesp Units IV/HD weekly Venofer 0  Other Op lab 10.9 hgb ca 8.8 phos 8.0   Assessment/ Plan:    1. Hyperkalemia/ DKA=improved after reinstitution of insulin gtt and bicarb drip. 2. Hyponatremia= corrected with HD and BS control  3. ESRD - HD TTS [facility]), plan for HD again tomorrow 4. Hypertension/volume - No excess vol. On exam or cxr / vomitting  No uf on hd/ slow hydration 5. Anemia - ESA On hd 6. Metabolic bone disease - Binder When po and vit d on hd 7. Iddm Type 1 /BLIND- insulin per admit 8. Depression- on no meds from op listing/ noted she singed out of hosp. Kanon Novosel A 10/27/2014, 10:11 AM

## 2014-10-27 NOTE — Care Management Note (Signed)
    Page 1 of 1   10/27/2014     8:50:20 AM CARE MANAGEMENT NOTE 10/27/2014  Patient:  [name],[name]   Account Number:  [id number]  Date Initiated:  10/27/2014  Documentation initiated by:  Junius CreamerWELL,DEBBIE  Subjective/Objective Assessment:   adm w dka     Action/Plan:   pcp dr Aubery Lappingr grunz, pt from home, esrd on hd   Anticipated DC Date:     Anticipated DC Plan:           Choice offered to / List presented to:             Status of service:   Medicare Important Message given?  YES (If response is "NO", the following Medicare IM given date fields will be blank) Date Medicare IM given:  10/27/2014 Medicare IM given by:  Junius CreamerWELL,DEBBIE Date Additional Medicare IM given:   Additional Medicare IM given by:    Discharge Disposition:    Per UR Regulation:  Reviewed for med. necessity/level of care/duration of stay  If discussed at Long Length of Stay Meetings, dates discussed:    Comments:

## 2014-10-27 NOTE — Progress Notes (Signed)
CRITICAL VALUE ALERT  Critical value received:  Potassium Date of notification: 10/27/14 Time of notification:  1417 Critical value read back: yes  Nurse who received alert:  Vincente PoliLaToya Guiseppe Flanagan  MD notified (1st page):  yes Time of first page:  1423 MD notified (2nd page): Dr, Pollie MeyerMcIntyre  Time of second page:  Responding MD:  Dr. Pollie MeyerMcIntyre Time MD responded:1425

## 2014-10-28 LAB — CBC
HEMATOCRIT: 29.9 % — AB (ref 36.0–46.0)
Hemoglobin: 10.3 g/dL — ABNORMAL LOW (ref 12.0–15.0)
MCH: 30.9 pg (ref 26.0–34.0)
MCHC: 34.4 g/dL (ref 30.0–36.0)
MCV: 89.8 fL (ref 78.0–100.0)
PLATELETS: 138 10*3/uL — AB (ref 150–400)
RBC: 3.33 MIL/uL — AB (ref 3.87–5.11)
RDW: 13.2 % (ref 11.5–15.5)
WBC: 3.4 10*3/uL — ABNORMAL LOW (ref 4.0–10.5)

## 2014-10-28 LAB — GLUCOSE, CAPILLARY
GLUCOSE-CAPILLARY: 170 mg/dL — AB (ref 70–99)
GLUCOSE-CAPILLARY: 239 mg/dL — AB (ref 70–99)
Glucose-Capillary: 284 mg/dL — ABNORMAL HIGH (ref 70–99)
Glucose-Capillary: 172 mg/dL — ABNORMAL HIGH (ref 70–99)
Glucose-Capillary: 530 mg/dL — ABNORMAL HIGH (ref 70–99)

## 2014-10-28 LAB — RENAL FUNCTION PANEL
Albumin: 3 g/dL — ABNORMAL LOW (ref 3.5–5.2)
Anion gap: 9 (ref 5–15)
BUN: 8 mg/dL (ref 6–23)
CO2: 26 mmol/L (ref 19–32)
Calcium: 8.6 mg/dL (ref 8.4–10.5)
Chloride: 98 mmol/L (ref 96–112)
Creatinine, Ser: 4.83 mg/dL — ABNORMAL HIGH (ref 0.50–1.10)
GFR calc Af Amer: 13 mL/min — ABNORMAL LOW (ref >90)
GFR calc non Af Amer: 11 mL/min — ABNORMAL LOW (ref >90)
Glucose, Bld: 261 mg/dL — ABNORMAL HIGH (ref 70–99)
PHOSPHORUS: 2.6 mg/dL (ref 2.3–4.6)
POTASSIUM: 5.2 mmol/L — AB (ref 3.5–5.1)
Sodium: 133 mmol/L — ABNORMAL LOW (ref 135–145)

## 2014-10-28 MED ORDER — DOXERCALCIFEROL 4 MCG/2ML IV SOLN
INTRAVENOUS | Status: AC
  Filled 2014-10-28: qty 2

## 2014-10-28 MED ORDER — INSULIN GLARGINE 100 UNIT/ML ~~LOC~~ SOLN
7.0000 [IU] | Freq: Two times a day (BID) | SUBCUTANEOUS | Status: DC
  Administered 2014-10-28: 7 [IU] via SUBCUTANEOUS
  Filled 2014-10-28 (×2): qty 0.07

## 2014-10-28 MED ORDER — AMLODIPINE BESYLATE 10 MG PO TABS
10.0000 mg | ORAL_TABLET | Freq: Two times a day (BID) | ORAL | Status: DC
  Administered 2014-10-28 – 2014-10-29 (×2): 10 mg via ORAL
  Filled 2014-10-28 (×4): qty 1

## 2014-10-28 MED ORDER — AMLODIPINE BESYLATE 10 MG PO TABS: 10.0000 mg | ORAL_TABLET | Freq: Two times a day (BID) | ORAL | Status: DC

## 2014-10-28 MED ORDER — INSULIN GLARGINE 100 UNIT/ML ~~LOC~~ SOLN
7.0000 [IU] | Freq: Every day | SUBCUTANEOUS | Status: DC
  Filled 2014-10-28: qty 0.07

## 2014-10-28 MED ORDER — CARVEDILOL 25 MG PO TABS
25.0000 mg | ORAL_TABLET | ORAL | Status: DC
  Administered 2014-10-29: 25 mg via ORAL
  Filled 2014-10-28 (×2): qty 1

## 2014-10-28 MED ORDER — INSULIN ASPART 100 UNIT/ML ~~LOC~~ SOLN
8.0000 [IU] | Freq: Once | SUBCUTANEOUS | Status: AC
  Administered 2014-10-28: 8 [IU] via SUBCUTANEOUS

## 2014-10-28 MED ORDER — PANTOPRAZOLE SODIUM 40 MG PO TBEC
40.0000 mg | DELAYED_RELEASE_TABLET | Freq: Two times a day (BID) | ORAL | Status: DC
  Administered 2014-10-28 – 2014-10-29 (×2): 40 mg via ORAL
  Filled 2014-10-28 (×3): qty 1

## 2014-10-28 MED ORDER — SODIUM CHLORIDE 0.9 % IV BOLUS (SEPSIS)
500.0000 mL | Freq: Once | INTRAVENOUS | Status: AC
  Administered 2014-10-28: 500 mL via INTRAVENOUS

## 2014-10-28 MED ORDER — LISINOPRIL 20 MG PO TABS
20.0000 mg | ORAL_TABLET | Freq: Two times a day (BID) | ORAL | Status: DC
  Administered 2014-10-28 – 2014-10-29 (×2): 20 mg via ORAL
  Filled 2014-10-28 (×4): qty 1

## 2014-10-28 NOTE — Progress Notes (Addendum)
[facility] Progress Note   Subjective: feeling still not so good  Filed Vitals:   10/28/14 1100 10/28/14 1134 10/28/14 1200 10/28/14 1250  BP: 173/91 156/86 178/97 168/101  Pulse: 71 71 71 87  Temp:    98 F (36.7 C)  TempSrc:    Oral  Resp:  13 12 11   Height:      Weight:    59.6 kg (131 lb 6.3 oz)  SpO2:  100% 99% 99%   Exam: Gen:WD WN AAF in NAD CVS:no rub Resp:cta ZOX:WRUEAVAbd:benign Ext:no edema, R thigh AVG +T/B    HD: [facility]TTS 4h   61kg     1K/2.5 Bath    Heparin 3000   Access R fem AVG Hectorol 3 mcg      Aranesp 100 mcg  Venofernone Other Op lab 10.9 hgb ca 8.8 phos 8.       Assessment: 1. DKA - resolved 2. Hyperkalemia - corrected with HD and BS control  3. ESRD - HD TTS 4. HTN - pt has intradialytic hypertension, meaning that her HTN gets worse during HD treatments; this is not common but is seen occasionally w HD patients.  This is the reason that she does not hold her am BP meds on HD days, especially the Coreg which is noted to help with this condition when given before HD.  Have rearranged BP meds to how she takes them at home. 5. Volume - below dry wt, feels dehydrated 6. Anemia - ESA On hd 7. Metabolic bone disease - Binder When po and vit d on hd 8. Iddm Type 1 /BLIND- insulin per admit  Plan- HD today completed.  Adjusted BP meds to home regimen. NS 500 cc bolus for mild vol depletion from N/V prior to admission.     Vinson Moselleob Lou Loewe MD  pager [phone]    cell [phone]  10/28/2014, 1:48 PM     Recent Labs Lab 10/27/14 0541 10/27/14 1323 10/28/14 0226  NA 137 131* 133*  K 5.1 7.2* 5.2*  CL 101 97 98  CO2 26 21 26   GLUCOSE 100* 299* 261*  BUN 13 17 8   CREATININE 5.98* 6.84* 4.83*  CALCIUM 8.7 9.1 8.6  PHOS  --   --  2.6    Recent Labs Lab 10/24/14 0945 10/28/14 0226  AST 24  --   ALT 22  --   ALKPHOS 100  --   BILITOT 2.0*  --   PROT 8.6*  --   ALBUMIN 4.4 3.0*    Recent Labs Lab 10/24/14 0945   10/26/14 0731 10/27/14 0628 10/28/14 0226  WBC 7.6  < > 7.3 5.2 3.4*  NEUTROABS 6.7  --   --   --   --   HGB 13.2  < > 10.5* 10.5* 10.3*  HCT 36.2  < > 29.5* 30.7* 29.9*  MCV 85.4  < > 87.3 89.8 89.8  PLT 228  < > 197 164 138*  < > = values in this interval not displayed. Marland Kitchen. amLODipine  10 mg Oral QHS  . antiseptic oral rinse  7 mL Mouth Rinse BID  . carvedilol  25 mg Oral BID WC  . darbepoetin (ARANESP) injection - DIALYSIS  100 mcg Intravenous Q Thu-HD  . docusate sodium  100 mg Oral BID  . doxercalciferol  3 mcg Intravenous Q T,Th,Sa-HD  . heparin  5,000 Units Subcutaneous 3 times per day  . insulin aspart  0-9 Units Subcutaneous TID WC  . insulin glargine  7 Units Subcutaneous BID  . levothyroxine  100 mcg Oral QAC breakfast  . lisinopril  20 mg Oral BID  . pantoprazole  40 mg Oral BID  . senna  1 tablet Oral QHS  . sodium chloride  3 mL Intravenous Q12H  . sodium chloride  3 mL Intravenous Q12H   . dextrose 5 % and 0.45% NaCl 10 mL/hr at 10/26/14 1131   sodium chloride, acetaminophen **OR** acetaminophen, dextrose, heparin, heparin, ondansetron **OR** ondansetron (ZOFRAN) IV

## 2014-10-28 NOTE — Progress Notes (Signed)
To Dialysis 

## 2014-10-28 NOTE — Progress Notes (Signed)
Family Medicine Teaching Service Daily Progress Note Intern Pager: [phone]  Patient name: [name] Medical record number: [id number] Date of birth: 06/17/1981 Age: 34 y.o. Gender: female  Primary Care Provider: Hazeline JunkerGrunz, Ryan, MD Consultants: Nephrology, GI Code Status: Full  Pt Overview and Major Events to Date:  1/19: Admitted for hyperglycemia/DKA 1/21: GI consulted for abdominal pain; empiric treatment for gastritis / esophagitis 1/22: received extra dialysis due to hyperkalemia 7.2  Assessment and Plan: [name] is a 34 y.o. female presenting with DKA secondary to vomiting. PMH is significant for type 1 diabetes mellitus, diabetic retinopathy causing blindness, ESRD on HD, HTN, hypothyroidism, peripheral polyneuropathy, and h/o DVT of UE.  # DKA in setting of T1DM- Home medications of diabetes include Novolog 1-3U daily, and Lantus 14U BID. Reported not taking medications for several days prior to admission. Brittle diabetic with rapid changes in blood glucose. Inciting event could be gastroenteritis(N/V) vs failure to administrate medication accurately (patient states she is taking different than prescribed). DKA resolved. Patient improving; CBG's ranging low 100's to high 200's last 24h.  - continue Lantus 7 units daily, Novolog at mealtimes, carb-modified diet - BMP daily - fluids KVO - Follow CBGs and electrolytes - prn antiemetics  # Normocytic anemia of chronic disease: Hbg 10.3. Baseline around 10.  - gastric occult blood positive - continue PPI BID due to concern for esophagitis.  - continue epo per renal  # Abdominal Pain: Overall improved, epigastric suggesting gastritis / esophagitis. No bowel movement x1 week prior to admission. No fevers noted. CT renal w/o contrast negative (fluid overload with mild ascites and mild anasarca, fatty infiltration of liver). UA not consistent with UTI. WBC 7.6 on admission. EKG showed peaked T-waves (pt hyperkalemic at the time)  but troponins negative. - Acetaminophen PRN pain and fever - d/c morphine as pt on HD, avoid accumulation of metabolites - Zofran PRN nausea - Continue bowel regimen, colace/senna - GI consulted and recommended no EGD; continue PPI, reconsult GI if needed  # ESRD- Receives dialysis on Tuesday, Thursday, Saturday. Missed outpt HD on day of admit but taken to HD on evening of admission. Marked hyponatremia on admit, resolved - continue HD, ESA per renal; appreciate assistance with management - Creatinine 13.54 on admission. Now 6. - continue ESA per renal  # Hyperkalemia - 7.2 on 1/22, related to overcorrection while on insulin drip; received extra dialysis 1/22, now improved  - monitor daily / with HD  # Hypothyroidism: Last TSH 3.49 (06/2014). - Continue home medication of Levothyroxine 100mcg daily - recommend outpt recheck of TSH in a couple months (avoid now due to acute illness)  # HTN: BP 207/100 in ED. Home medications include Amlodipine 10mg , Coreg 25mg  BID, and Lisinopril 20mg  BID. Intermittently elevated. - question utility of lisinopril given ESRD - Continue Amlodipine, Coreg, and Lisinopril for now, monitor  # Psych hx: recently discharged on 12/24 to [facility] inpt due to suicidal ideation (refusing HD, insulin). No notes visible from this admit - denies suicidal ideation or wanting to stop HD, at this point; upset with management (feels "family medicine keeps messing me up") but no overt depression / SI / HI  FEN/GI: carb-modified diet; KVO fluids Prophylaxis: Subcutaneous Heparin  Disposition: transfer to telemetry, with management as above; anticipate discharge home in the next few days if CBG's stabilize  Subjective:  Pt very upset this morning with management; feels "family medicine keeps messing me up, this time and last time." Pt strongly feels that "  my diabetes can't be managed like every other diabetic" and reports she takes insulin at home "based  on how she feels and what her sugars are doing." States she is "leaving Monday (two days from now), regardless." Overall abdominal pain is better. Pt denies other chest pain, SOB.  Objective: Temp:  [98 F (36.7 C)-98.9 F (37.2 C)] 98.2 F (36.8 C) (01/23 0819) Pulse Rate:  [66-76] 72 (01/23 0829) Resp:  [10-30] 13 (01/23 0824) BP: (140-191)/(24-101) 178/97 mmHg (01/23 0829) SpO2:  [98 %-100 %] 99 % (01/23 0824) Weight:  [133 lb 2.5 oz (60.4 kg)-134 lb 7.7 oz (61 kg)] 133 lb 2.5 oz (60.4 kg) (01/23 0819) Physical Exam: General: NAD, lying in bed on HD HEENT:  Normocephalic. Blind.   Cardiovascular: RRR. S1 and S2 noted. Systolic murmur present (baseline) Respiratory: CTAB, no wheezes, normal WOB Abdomen: BS+, remains with epigastric tenderness but improved, no rebound or guarding; soft throughout. Extremities: warm, well-perfused, no LE edema Skin: No rash appreciated Neuro: A&O. No focal deficits.  Laboratory: CBC  Recent Labs Lab 10/26/14 0731 10/27/14 0628 10/28/14 0226  WBC 7.3 5.2 3.4*  HGB 10.5* 10.5* 10.3*  HCT 29.5* 30.7* 29.9*  PLT 197 164 138*     BMET  Recent Labs Lab 10/27/14 0541 10/27/14 1323 10/28/14 0226  NA 137 131* 133*  K 5.1 7.2* 5.2*  CL 101 97 98  CO2 BUN CREATININE 5.98* 6.84* 4.83*  GLUCOSE 100* 299* 261*  CALCIUM 8.7 9.1 8.6       Imaging/Diagnostic Tests: Dg Abd Acute W/chest 10/24/2014   IMPRESSION: NO ACTIVE CARDIOPULMONARY DISEASE  NORMAL BOWEL GAS PATTERN.  POSSIBLE SMALL LEFT RENAL CALCULUS.     Ct Renal Stone Study 10/24/2014  IMPRESSION: Findings consistent with fluid overload with mild amount of ascites and mild anasarca.  Fatty infiltration of liver.   Electronically Signed   By: Marlan Palau M.D.   On: 10/24/2014 13:16   U/S Abd 1/21 negative  Bobbye Morton, MD 10/28/2014, 9:17 AM PGY-3, [facility] FPTS Intern pager: [phone], text pages welcome

## 2014-10-29 ENCOUNTER — Inpatient Hospital Stay (HOSPITAL_COMMUNITY): Payer: 59

## 2014-10-29 DIAGNOSIS — K5900 Constipation, unspecified: Secondary | ICD-10-CM | POA: Insufficient documentation

## 2014-10-29 LAB — CBC
HEMATOCRIT: 30.6 % — AB (ref 36.0–46.0)
HEMOGLOBIN: 10.7 g/dL — AB (ref 12.0–15.0)
MCH: 31.1 pg (ref 26.0–34.0)
MCHC: 35 g/dL (ref 30.0–36.0)
MCV: 89 fL (ref 78.0–100.0)
Platelets: 150 10*3/uL (ref 150–400)
RBC: 3.44 MIL/uL — ABNORMAL LOW (ref 3.87–5.11)
RDW: 12.8 % (ref 11.5–15.5)
WBC: 4.2 10*3/uL (ref 4.0–10.5)

## 2014-10-29 LAB — GLUCOSE, CAPILLARY
Glucose-Capillary: 132 mg/dL — ABNORMAL HIGH (ref 70–99)
Glucose-Capillary: 176 mg/dL — ABNORMAL HIGH (ref 70–99)
Glucose-Capillary: 373 mg/dL — ABNORMAL HIGH (ref 70–99)
Glucose-Capillary: 529 mg/dL — ABNORMAL HIGH (ref 70–99)
Glucose-Capillary: 583 mg/dL (ref 70–99)

## 2014-10-29 LAB — BASIC METABOLIC PANEL
Anion gap: 10 (ref 5–15)
BUN: 20 mg/dL (ref 6–23)
CHLORIDE: 92 mmol/L — AB (ref 96–112)
CO2: 26 mmol/L (ref 19–32)
Calcium: 8.3 mg/dL — ABNORMAL LOW (ref 8.4–10.5)
Creatinine, Ser: 5.39 mg/dL — ABNORMAL HIGH (ref 0.50–1.10)
GFR calc Af Amer: 11 mL/min — ABNORMAL LOW (ref >90)
GFR calc non Af Amer: 10 mL/min — ABNORMAL LOW (ref >90)
Glucose, Bld: 563 mg/dL (ref 70–99)
Potassium: 4.5 mmol/L (ref 3.5–5.1)
Sodium: 128 mmol/L — ABNORMAL LOW (ref 135–145)

## 2014-10-29 MED ORDER — INSULIN GLARGINE 100 UNIT/ML ~~LOC~~ SOLN: 10.0000 [IU] | Freq: Two times a day (BID) | SUBCUTANEOUS | Status: DC

## 2014-10-29 MED ORDER — INSULIN ASPART 100 UNIT/ML ~~LOC~~ SOLN
7.0000 [IU] | Freq: Three times a day (TID) | SUBCUTANEOUS | Status: DC
  Administered 2014-10-29 (×2): 7 [IU] via SUBCUTANEOUS

## 2014-10-29 MED ORDER — INSULIN ASPART 100 UNIT/ML ~~LOC~~ SOLN: 7.0000 [IU] | Freq: Three times a day (TID) | SUBCUTANEOUS | Status: DC

## 2014-10-29 MED ORDER — INSULIN GLARGINE 100 UNIT/ML ~~LOC~~ SOLN
10.0000 [IU] | Freq: Two times a day (BID) | SUBCUTANEOUS | Status: DC
  Administered 2014-10-29: 10 [IU] via SUBCUTANEOUS
  Filled 2014-10-29 (×2): qty 0.1

## 2014-10-29 MED ORDER — INSULIN ASPART 100 UNIT/ML ~~LOC~~ SOLN
10.0000 [IU] | Freq: Once | SUBCUTANEOUS | Status: AC
  Administered 2014-10-29: 10 [IU] via SUBCUTANEOUS

## 2014-10-29 MED ORDER — INSULIN ASPART 100 UNIT/ML ~~LOC~~ SOLN
8.0000 [IU] | Freq: Once | SUBCUTANEOUS | Status: AC
  Administered 2014-10-29: 8 [IU] via SUBCUTANEOUS

## 2014-10-29 NOTE — Progress Notes (Signed)
FPTS PGY-3 Interim Note  LATE ENTRY. Called by nursing earlier this AM / late last night, with CBG's over 500. Lantus given as charted. Novolog given 8 units at 2226, 10 units at 0100. BMP checked at 0120 with anion gap 10, and CBG down to 373 at 0318. Pt very upset and wanting to leave. Called to bedside around 0400; similar conversation as yesterday morning -- pt very upset and feeling as though sugars are not being managed properly, insulin not being given when needed, her concerns not being properly addressed, etc. Explained to her reasoning behind insulin dosing as it has been done, today.  Discussed at length how and why clinical reasoning has been applied to her case. Discussed that it is reasonable to adjust her medication administration to more closely match how her insulin is taken at home. Will change Lantus order to 0830 and 2030 administration times (will increase to 10 units, up from 7, with home dose being 14 units BID), add meal coverage Novolog (7 units at mealtimes) in addition to sliding scale correction at mealtimes, and continue to monitor. Pt may be able to discharge home, later today, if blood sugars are more stable. Pt agreeable to this, at this time. Will return later this morning for normal rounding, or sooner if needed.  Bobbye Mortonhristopher M Farooq Petrovich, MD PGY-3, [facility] 10/29/2014, 4:31 AM

## 2014-10-29 NOTE — Discharge Instructions (Signed)
You were admitted for abdominal pain and DKA (see below).  It is important for you to take your insulin exactly as prescribed.  Diabetic Ketoacidosis Diabetic ketoacidosis (DKA) is a life-threatening complication of type 1 diabetes. It must be quickly recognized and treated. Treatment requires hospitalization. CAUSES  When there is no insulin in the body, glucose (sugar) cannot be used, and the body breaks down fat for energy. When fat breaks down, acids (ketones) build up in the blood. Very high levels of glucose and high levels of acids lead to severe loss of body fluids (dehydration) and other dangerous chemical changes. This stresses your vital organs and can cause coma or death. SIGNS AND SYMPTOMS   Tiredness (fatigue).  Weight loss.  Excessive thirst.  Ketones in your urine.  Light-headedness.  Fruity or sweet smelling breath.  Excessive urination.  Visual changes.  Confusion or irritability.  Nausea or vomiting.  Rapid breathing.  Stomachache or abdominal pain. DIAGNOSIS  Your health care provider will diagnose DKA based on your history, physical exam, and blood tests. The health care provider will check to see if you have another illness that caused you to go into DKA. Most of this will be done quickly in an emergency room. TREATMENT   Fluid replacement to correct dehydration.  Insulin.  Correction of electrolytes, such as potassium and sodium.  Antibiotic medicines. PREVENTION  Always take your insulin. Do not skip your insulin injections.  If you are sick, treat yourself quickly. Your body often needs more insulin to fight the illness.  Check your blood glucose regularly.  Check urine ketones if your blood glucose is greater than 240 milligrams per deciliter (mg/dL).  Do not use outdated (expired) insulin.  If your blood glucose is high, drink plenty of fluids. This helps flush out ketones. HOME CARE INSTRUCTIONS   If you are sick, follow the  advice of your health care provider.  To prevent dehydration, drink enough water and fluids to keep your urine clear or pale yellow.  If you cannot eat, alternate between drinking fluids with sugar (soda, juices, flavored gelatin) and salty fluids (broth, bouillon).  If you can eat, follow your usual diet and drink sugar-free liquids (water, diet drinks).  Always take your usual dose of insulin. If you cannot eat or if your glucose is getting too low, call your health care provider for further instructions.  Continue to monitor your blood or urine ketones every 3-4 hours around the clock. Set your alarm clock or have someone wake you up. If you are too sick, have someone test it for you.  Rest and avoid exercise. SEEK MEDICAL CARE IF:   You have a fever.  You have ketones in your urine, or your blood glucose is higher than a level your health care provider suggests. You may need extra insulin. Call your health care provider if you need advice on adjusting your insulin.  You cannot drink at least a tablespoon (15 mL) of fluid every 15-20 minutes.  You have been vomiting for more than 2 hours.  You have symptoms of DKA:  Fruity smelling breath.  Breathing faster or slower.  Becoming very sleepy. SEEK IMMEDIATE MEDICAL CARE IF:   You have signs of dehydration:  Decreased urination.  Increased thirst.  Dry skin and mouth.  Light-headedness.  Your blood glucose is very high (as advised by your health care provider) twice in a row.  You faint.  You have chest pain or trouble breathing.  You have a  sudden, severe headache.  You have sudden weakness in one arm or one leg.  You have sudden trouble speaking or swallowing.  You have vomiting or diarrhea that is getting worse after 3 hours.  You have abdominal pain. MAKE SURE YOU:   Understand these instructions.  Will watch your condition.  Will get help right away if you are not doing well or get worse. Document  Released: 09/19/2000 Document Revised: 09/27/2013 Document Reviewed: 03/28/2009 [facility] Patient Information 2015 [place], [place]. This information is not intended to replace advice given to you by your health care provider. Make sure you discuss any questions you have with your health care provider.

## 2014-10-29 NOTE — Progress Notes (Addendum)
Family Medicine Teaching Service Daily Progress Note Intern Pager: [phone]  Patient name: [name] Medical record number: [id number] Date of birth: November 27, 1980 Age: 34 y.o. Gender: female  Primary Care Provider: Hazeline Junker, MD Consultants: Nephrology, GI Code Status: Full  Pt Overview and Major Events to Date:  1/19: Admitted for hyperglycemia/DKA 1/21: GI consulted for abdominal pain; empiric treatment for gastritis / esophagitis 1/22: received extra dialysis due to hyperkalemia 7.2 1/23 - overnight 1/24: late PM hyperglycemia but no re-opening of anion gap; improved AM 1/24, insulin regimen adjusted more closely to pt's home insulin  Assessment and Plan: [name] is a 34 y.o. female presenting with DKA secondary to vomiting. PMH is significant for type 1 diabetes mellitus, diabetic retinopathy causing blindness, ESRD on HD, HTN, hypothyroidism, peripheral polyneuropathy, and h/o DVT of UE.  # DKA in setting of T1DM- Home medications of diabetes include Novolog 1-3U daily, and Lantus 14U BID. Reported not taking medications for several days prior to admission. Brittle diabetic with rapid changes in blood glucose. Inciting event could be gastroenteritis(N/V) vs failure to administrate medication accurately (patient states she is taking different than prescribed). DKA resolved. Patient improving; CBG's overall improved 1/23, but with highs to the 500's late PM; see interim progress note 1/24 early AM by Dr. Casper Harrison - insulin regimen adjusted closer to pt's reported home use: Lantus 10 units BID (increased from 7, takes 14 BID at home), Novolog SSI correction PLUS 7 units meal coverage TID - anticipate resuming prior home insulin regimen at discharge - STRONGLY consider outpt endocrinology referral after discharge - carb-modified diet - monitor BMP daily - saline lock fluids - antiemetics PRN  # Normocytic anemia of chronic disease: Hbg 10.7 on 1/24. Baseline around 10.  - gastric  occult blood positive - continue PPI BID due to concern for esophagitis - consider reconsult GI if needed; see below - continue epo per renal  # Abdominal Pain: Overall improved, epigastric suggesting gastritis / esophagitis. No bowel movement x1 week prior to admission. No fevers noted. CT renal w/o contrast negative (fluid overload with mild ascites and mild anasarca, fatty infiltration of liver). UA not consistent with UTI. WBC 7.6 on admission. EKG showed peaked T-waves (pt hyperkalemic at the time) but troponins negative. - Acetaminophen PRN pain and fever - originally given morphine, d/c'd as pt is on HD to avoid accumulation of metabolites - Zofran PRN nausea - Continue bowel regimen, colace/senna - GI consulted and recommended no EGD; continue PPI, reconsult GI if needed  # ESRD- Receives dialysis on Tuesday, Thursday, Saturday. Missed outpt HD on day of admit but taken to HD on evening of admission - Marked hyponatremia on admit, resolved; see hyperkalemia, below - continue HD, ESA per renal; appreciate assistance with management - Creatinine 13.54 on admission. Now 6. - continue ESA per renal  # Hyperkalemia - 7.2 on 1/22, related to overcorrection while on insulin drip; received extra dialysis 1/22, now improved  - monitor daily / with HD  # Hypothyroidism: Last TSH 3.49 (06/2014). - Continue home medication of Levothyroxine daily - recommend outpt recheck of TSH in a couple months (avoid now due to acute illness)  # HTN: BP 207/100 in ED. Home medications include Amlodipine  BID, Coreg 25 mg ONCE daily, and Lisinopril  BID. Intermittently elevated especially with stress - question utility of lisinopril given ESRD; medications being adjusted by nephrology, appreciate assistance - continue to monitor  # Psych hx: recently discharged on 12/24 to [facility]  inpt due to suicidal ideation (refusing HD, insulin). No notes visible from this admit -  denies suicidal ideation or wanting to stop HD, at this point; upset with management (feels "family medicine keeps messing me up") but no overt depression / SI / HI  FEN/GI: carb-modified diet; KVO fluids Prophylaxis: Subcutaneous Heparin  Disposition: transfer to telemetry bed (ordered 1/24), with management as above - possible discharge today 1/24 or tomorrow 1/25 if CBG's are more stable, today  Subjective:  Pt resting this morning, still not feeling well; see interim progress note ~4:30 AM. Per nursing pt has not specifically complained of anything since that time.  Objective: Temp:  [98 F (36.7 C)-98.5 F (36.9 C)] 98.3 F (36.8 C) (01/24 0008) Pulse Rate:  [70-87] 87 (01/24 0008) Resp:  [11-21] 13 (01/24 0008) BP: (156-201)/(82-102) 165/82 mmHg (01/24 0008) SpO2:  [99 %-100 %] 100 % (01/24 0008) Weight:  [131 lb 6.3 oz (59.6 kg)] 131 lb 6.3 oz (59.6 kg) (01/23 1250) Physical Exam: General: NAD, lying in bed in NAD HEENT:  Normocephalic. Blind.   Cardiovascular: RRR. S1 and S2 noted. Systolic murmur present (baseline) Respiratory: CTAB, no wheezes, normal WOB Abdomen: BS+, remains with epigastric tenderness but improved, no rebound or guarding; soft throughout. Extremities: warm, well-perfused, no LE edema Skin: No rash appreciated Neuro: A&O. No focal deficits.  Laboratory: CBC  Recent Labs Lab 10/27/14 0628 10/28/14 0226 10/29/14 0120  WBC 5.2 3.4* 4.2  HGB 10.5* 10.3* 10.7*  HCT 30.7* 29.9* 30.6*  PLT 164 138* 150     BMET  Recent Labs Lab 10/27/14 1323 10/28/14 0226 10/29/14 0120  NA 131* 133* 128*  K 7.2* 5.2* 4.5  CL 97 98 92*  CO2 21 26 26   BUN 17 8 20   CREATININE 6.84* 4.83* 5.39*  GLUCOSE 299* 261* 563*  CALCIUM 9.1 8.6 8.3*       Imaging/Diagnostic Tests: Dg Abd Acute W/chest 10/24/2014   IMPRESSION: NO ACTIVE CARDIOPULMONARY DISEASE  NORMAL BOWEL GAS PATTERN.  POSSIBLE SMALL LEFT RENAL CALCULUS.     Ct Renal Stone Study 10/24/2014   IMPRESSION: Findings consistent with fluid overload with mild amount of ascites and mild anasarca.  Fatty infiltration of liver.   Electronically Signed   By: Marlan Palauharles  Clark M.D.   On: 10/24/2014 13:16   U/S Abd 1/21 negative  Bobbye Mortonhristopher M Bonita Brindisi, MD 10/29/2014, 9:05 AM PGY-3, [place] [facility] FPTS Intern pager: [phone], text pages welcome

## 2014-10-29 NOTE — Progress Notes (Signed)
[facility] Progress Note   Subjective: nauseous  Filed Vitals:   10/28/14 1643 10/28/14 1806 10/29/14 0008 10/29/14 0900  BP: 201/85 180/85 165/82 171/90  Pulse:   87 84  Temp:   98.3 F (36.8 C) 98.5 F (36.9 C)  TempSrc:   Oral Oral  Resp: 21  13 16   Height:      Weight:      SpO2:   100% 99%   Exam: Gen:WD WN AAF in NAD CVS:no rub Resp:cta WUJ:WJXBJYAbd:benign Ext:no edema, R thigh AVG +T/B    HD: NWTTS 4h   61kg     1K/2.5 Bath    Heparin 3000   Access R fem AVG Hectorol 3 mcg      Aranesp 100 mcg  Venofernone Other Op lab 10.9 hgb ca 8.8 phos 8.       Assessment: 1. DKA - per primary 2. Nausea - persists 3. ESRD - HD TTS 4. HTN - pt has intradialytic hypertension, meaning that her HTN gets worse during HD treatments; this is not common but is seen occasionally w HD patients.  This is the reason that she does not hold her am BP meds on HD days, especially the Coreg which is noted to help with this condition when given before HD.  Have rearranged BP meds to how she takes them at home. 5. Volume - just under dry wt ,no vol excess. High BP's in this pt aren't necessarily indicative of vol overload.  6. Anemia - ESA On hd 7. Metabolic bone disease - Binder When po and vit d on hd 8. Iddm Type 1 /BLIND- insulin per admit  Plan-  Next HD Tuesday    Vinson Moselleob Shauntee Karp MD  pager [phone]    cell [phone]  10/29/2014, 10:16 AM     Recent Labs Lab 10/27/14 1323 10/28/14 0226 10/29/14 0120  NA 131* 133* 128*  K 7.2* 5.2* 4.5  CL 97 98 92*  CO2 21 26 26   GLUCOSE 299* 261* 563*  BUN 17 8 20   CREATININE 6.84* 4.83* 5.39*  CALCIUM 9.1 8.6 8.3*  PHOS  --  2.6  --     Recent Labs Lab 10/24/14 0945 10/28/14 0226  AST 24  --   ALT 22  --   ALKPHOS 100  --   BILITOT 2.0*  --   PROT 8.6*  --   ALBUMIN 4.4 3.0*    Recent Labs Lab 10/24/14 0945  10/27/14 0628 10/28/14 0226 10/29/14 0120  WBC 7.6  < > 5.2 3.4* 4.2  NEUTROABS 6.7   --   --   --   --   HGB 13.2  < > 10.5* 10.3* 10.7*  HCT 36.2  < > 30.7* 29.9* 30.6*  MCV 85.4  < > 89.8 89.8 89.0  PLT 228  < > 164 138* 150  < > = values in this interval not displayed. Marland Kitchen. amLODipine  10 mg Oral BID  . antiseptic oral rinse  7 mL Mouth Rinse BID  . carvedilol  25 mg Oral BH-q7a  . darbepoetin (ARANESP) injection - DIALYSIS  100 mcg Intravenous Q Thu-HD  . docusate sodium  100 mg Oral BID  . doxercalciferol  3 mcg Intravenous Q T,Th,Sa-HD  . heparin  5,000 Units Subcutaneous 3 times per day  . insulin aspart  0-9 Units Subcutaneous TID WC  . insulin aspart  7 Units Subcutaneous TID WC  . insulin glargine  10 Units Subcutaneous BID  . levothyroxine  100 mcg Oral QAC breakfast  . lisinopril  20 mg Oral BID  . pantoprazole  40 mg Oral BID  . senna  1 tablet Oral QHS  . sodium chloride  3 mL Intravenous Q12H  . sodium chloride  3 mL Intravenous Q12H     sodium chloride, acetaminophen **OR** acetaminophen, dextrose, ondansetron **OR** ondansetron (ZOFRAN) IV

## 2014-11-12 ENCOUNTER — Observation Stay (HOSPITAL_COMMUNITY): Payer: 59

## 2014-11-12 ENCOUNTER — Observation Stay (HOSPITAL_COMMUNITY)
Admission: EM | Admit: 2014-11-12 | Discharge: 2014-11-14 | Disposition: A | Discharge status: Home / Self Care | Payer: 59 | Attending: Family Medicine | Admitting: Family Medicine

## 2014-11-12 ENCOUNTER — Emergency Department (HOSPITAL_COMMUNITY): Payer: 59

## 2014-11-12 ENCOUNTER — Encounter (HOSPITAL_COMMUNITY): Payer: Self-pay | Admitting: Emergency Medicine

## 2014-11-12 DIAGNOSIS — E10319 Type 1 diabetes mellitus with unspecified diabetic retinopathy without macular edema: Secondary | ICD-10-CM | POA: Insufficient documentation

## 2014-11-12 DIAGNOSIS — Z7982 Long term (current) use of aspirin: Secondary | ICD-10-CM | POA: Diagnosis not present

## 2014-11-12 DIAGNOSIS — H548 Legal blindness, as defined in USA: Secondary | ICD-10-CM

## 2014-11-12 DIAGNOSIS — E162 Hypoglycemia, unspecified: Secondary | ICD-10-CM | POA: Diagnosis not present

## 2014-11-12 DIAGNOSIS — I120 Hypertensive chronic kidney disease with stage 5 chronic kidney disease or end stage renal disease: Secondary | ICD-10-CM | POA: Diagnosis not present

## 2014-11-12 DIAGNOSIS — Z794 Long term (current) use of insulin: Secondary | ICD-10-CM | POA: Insufficient documentation

## 2014-11-12 DIAGNOSIS — Z86718 Personal history of other venous thrombosis and embolism: Secondary | ICD-10-CM | POA: Insufficient documentation

## 2014-11-12 DIAGNOSIS — E0500 Thyrotoxicosis with diffuse goiter without thyrotoxic crisis or storm: Secondary | ICD-10-CM | POA: Diagnosis not present

## 2014-11-12 DIAGNOSIS — T68XXXA Hypothermia, initial encounter: Secondary | ICD-10-CM | POA: Diagnosis not present

## 2014-11-12 DIAGNOSIS — R112 Nausea with vomiting, unspecified: Secondary | ICD-10-CM | POA: Insufficient documentation

## 2014-11-12 DIAGNOSIS — R400 Somnolence: Secondary | ICD-10-CM | POA: Diagnosis not present

## 2014-11-12 DIAGNOSIS — E1139 Type 2 diabetes mellitus with other diabetic ophthalmic complication: Secondary | ICD-10-CM | POA: Insufficient documentation

## 2014-11-12 DIAGNOSIS — Z992 Dependence on renal dialysis: Secondary | ICD-10-CM | POA: Diagnosis not present

## 2014-11-12 DIAGNOSIS — D649 Anemia, unspecified: Secondary | ICD-10-CM | POA: Insufficient documentation

## 2014-11-12 DIAGNOSIS — E10649 Type 1 diabetes mellitus with hypoglycemia without coma: Secondary | ICD-10-CM | POA: Insufficient documentation

## 2014-11-12 DIAGNOSIS — E039 Hypothyroidism, unspecified: Secondary | ICD-10-CM | POA: Diagnosis not present

## 2014-11-12 DIAGNOSIS — R4182 Altered mental status, unspecified: Principal | ICD-10-CM | POA: Insufficient documentation

## 2014-11-12 DIAGNOSIS — Z79899 Other long term (current) drug therapy: Secondary | ICD-10-CM | POA: Diagnosis not present

## 2014-11-12 DIAGNOSIS — K5900 Constipation, unspecified: Secondary | ICD-10-CM | POA: Diagnosis not present

## 2014-11-12 DIAGNOSIS — R1013 Epigastric pain: Secondary | ICD-10-CM | POA: Insufficient documentation

## 2014-11-12 DIAGNOSIS — N186 End stage renal disease: Secondary | ICD-10-CM | POA: Insufficient documentation

## 2014-11-12 DIAGNOSIS — E109 Type 1 diabetes mellitus without complications: Secondary | ICD-10-CM | POA: Diagnosis present

## 2014-11-12 DIAGNOSIS — H540 Blindness, both eyes: Secondary | ICD-10-CM | POA: Diagnosis not present

## 2014-11-12 DIAGNOSIS — I10 Essential (primary) hypertension: Secondary | ICD-10-CM | POA: Diagnosis present

## 2014-11-12 LAB — BASIC METABOLIC PANEL
Anion gap: 12 (ref 5–15)
BUN: 28 mg/dL — ABNORMAL HIGH (ref 6–23)
CHLORIDE: 100 mmol/L (ref 96–112)
CO2: 31 mmol/L (ref 19–32)
CREATININE: 6.94 mg/dL — AB (ref 0.50–1.10)
Calcium: 9.2 mg/dL (ref 8.4–10.5)
GFR calc non Af Amer: 7 mL/min — ABNORMAL LOW (ref >90)
GFR, EST AFRICAN AMERICAN: 8 mL/min — AB (ref >90)
Glucose, Bld: 125 mg/dL — ABNORMAL HIGH (ref 70–99)
Potassium: 4 mmol/L (ref 3.5–5.1)
Sodium: 143 mmol/L (ref 135–145)

## 2014-11-12 LAB — CBC WITH DIFFERENTIAL/PLATELET
BASOS ABS: 0.1 10*3/uL (ref 0.0–0.1)
Basophils Relative: 1 % (ref 0–1)
EOS ABS: 0 10*3/uL (ref 0.0–0.7)
Eosinophils Relative: 0 % (ref 0–5)
HEMATOCRIT: 40.1 % (ref 36.0–46.0)
HEMOGLOBIN: 13.5 g/dL (ref 12.0–15.0)
LYMPHS ABS: 1.6 10*3/uL (ref 0.7–4.0)
LYMPHS PCT: 24 % (ref 12–46)
MCH: 31.5 pg (ref 26.0–34.0)
MCHC: 33.7 g/dL (ref 30.0–36.0)
MCV: 93.7 fL (ref 78.0–100.0)
Monocytes Absolute: 0.4 10*3/uL (ref 0.1–1.0)
Monocytes Relative: 6 % (ref 3–12)
NEUTROS PCT: 69 % (ref 43–77)
Neutro Abs: 4.6 10*3/uL (ref 1.7–7.7)
PLATELETS: 186 10*3/uL (ref 150–400)
RBC: 4.28 MIL/uL (ref 3.87–5.11)
RDW: 14.3 % (ref 11.5–15.5)
WBC: 6.7 10*3/uL (ref 4.0–10.5)

## 2014-11-12 LAB — COMPREHENSIVE METABOLIC PANEL
ALBUMIN: 3.5 g/dL (ref 3.5–5.2)
ALK PHOS: 64 U/L (ref 39–117)
ALT: 13 U/L (ref 0–35)
AST: 15 U/L (ref 0–37)
Anion gap: 15 (ref 5–15)
BUN: 40 mg/dL — ABNORMAL HIGH (ref 6–23)
CO2: 24 mmol/L (ref 19–32)
Calcium: 8.5 mg/dL (ref 8.4–10.5)
Chloride: 99 mmol/L (ref 96–112)
Creatinine, Ser: 8.67 mg/dL — ABNORMAL HIGH (ref 0.50–1.10)
GFR calc Af Amer: 6 mL/min — ABNORMAL LOW (ref >90)
GFR calc non Af Amer: 5 mL/min — ABNORMAL LOW (ref >90)
GLUCOSE: 412 mg/dL — AB (ref 70–99)
Potassium: 3.9 mmol/L (ref 3.5–5.1)
SODIUM: 138 mmol/L (ref 135–145)
TOTAL PROTEIN: 7 g/dL (ref 6.0–8.3)
Total Bilirubin: 0.9 mg/dL (ref 0.3–1.2)

## 2014-11-12 LAB — RAPID URINE DRUG SCREEN, HOSP PERFORMED
AMPHETAMINES: NOT DETECTED
BENZODIAZEPINES: NOT DETECTED
Barbiturates: NOT DETECTED
Cocaine: NOT DETECTED
OPIATES: NOT DETECTED
TETRAHYDROCANNABINOL: NOT DETECTED

## 2014-11-12 LAB — CBG MONITORING, ED
GLUCOSE-CAPILLARY: 302 mg/dL — AB (ref 70–99)
Glucose-Capillary: 125 mg/dL — ABNORMAL HIGH (ref 70–99)
Glucose-Capillary: 134 mg/dL — ABNORMAL HIGH (ref 70–99)
Glucose-Capillary: 200 mg/dL — ABNORMAL HIGH (ref 70–99)
Glucose-Capillary: 245 mg/dL — ABNORMAL HIGH (ref 70–99)
Glucose-Capillary: 239 mg/dL — ABNORMAL HIGH (ref 70–99)

## 2014-11-12 LAB — I-STAT TROPONIN, ED: TROPONIN I, POC: 0 ng/mL (ref 0.00–0.08)

## 2014-11-12 LAB — URINALYSIS, ROUTINE W REFLEX MICROSCOPIC
BILIRUBIN URINE: NEGATIVE
Glucose, UA: 100 mg/dL — AB
Ketones, ur: NEGATIVE mg/dL
Leukocytes, UA: NEGATIVE
Nitrite: NEGATIVE
Protein, ur: >300 mg/dL — AB
Specific Gravity, Urine: 1.013 (ref 1.005–1.030)
Urobilinogen, UA: 0.2 mg/dL (ref 0.0–1.0)
pH: 8 (ref 5.0–8.0)

## 2014-11-12 LAB — TROPONIN I
Troponin I: <0.03 ng/mL (ref <0.031)
Troponin I: <0.03 ng/mL (ref <0.031)

## 2014-11-12 LAB — PHOSPHORUS: PHOSPHORUS: 5.7 mg/dL — AB (ref 2.3–4.6)

## 2014-11-12 LAB — CBC
HEMATOCRIT: 38.2 % (ref 36.0–46.0)
Hemoglobin: 13.1 g/dL (ref 12.0–15.0)
MCH: 31.6 pg (ref 26.0–34.0)
MCHC: 34.3 g/dL (ref 30.0–36.0)
MCV: 92.3 fL (ref 78.0–100.0)
PLATELETS: 195 10*3/uL (ref 150–400)
RBC: 4.14 MIL/uL (ref 3.87–5.11)
RDW: 14.1 % (ref 11.5–15.5)
WBC: 4.9 10*3/uL (ref 4.0–10.5)

## 2014-11-12 LAB — URINE MICROSCOPIC-ADD ON

## 2014-11-12 LAB — CREATININE, SERUM
Creatinine, Ser: 7.87 mg/dL — ABNORMAL HIGH (ref 0.50–1.10)
GFR calc Af Amer: 7 mL/min — ABNORMAL LOW (ref >90)
GFR calc non Af Amer: 6 mL/min — ABNORMAL LOW (ref >90)

## 2014-11-12 LAB — MAGNESIUM: Magnesium: 2.3 mg/dL (ref 1.5–2.5)

## 2014-11-12 LAB — HEPARIN LEVEL (UNFRACTIONATED): HEPARIN UNFRACTIONATED: 0.69 [IU]/mL (ref 0.30–0.70)

## 2014-11-12 LAB — ETHANOL: Alcohol, Ethyl (B): <5 mg/dL (ref 0–9)

## 2014-11-12 LAB — PROTIME-INR
INR: 1.01 (ref 0.00–1.49)
PROTHROMBIN TIME: 13.4 s (ref 11.6–15.2)

## 2014-11-12 LAB — TSH: TSH: 5.35 u[IU]/mL — ABNORMAL HIGH (ref 0.350–4.500)

## 2014-11-12 LAB — I-STAT CG4 LACTIC ACID, ED
LACTIC ACID, VENOUS: 1.23 mmol/L (ref 0.5–2.0)
Lactic Acid, Venous: 0.52 mmol/L (ref 0.5–2.0)

## 2014-11-12 LAB — GLUCOSE, CAPILLARY: Glucose-Capillary: 359 mg/dL — ABNORMAL HIGH (ref 70–99)

## 2014-11-12 MED ORDER — ASPIRIN 325 MG PO TABS
325.0000 mg | ORAL_TABLET | Freq: Every day | ORAL | Status: DC
  Administered 2014-11-13 – 2014-11-14 (×2): 325 mg via ORAL
  Filled 2014-11-12 (×2): qty 1

## 2014-11-12 MED ORDER — RENA-VITE PO TABS
1.0000 | ORAL_TABLET | Freq: Every day | ORAL | Status: DC
  Administered 2014-11-12 – 2014-11-13 (×2): 1 via ORAL
  Filled 2014-11-12 (×3): qty 1

## 2014-11-12 MED ORDER — SODIUM CHLORIDE 0.9 % IV SOLN: 250.0000 mL | INTRAVENOUS | Status: DC | PRN

## 2014-11-12 MED ORDER — CARVEDILOL 25 MG PO TABS
25.0000 mg | ORAL_TABLET | Freq: Every morning | ORAL | Status: DC
  Administered 2014-11-13 – 2014-11-14 (×2): 25 mg via ORAL
  Filled 2014-11-12 (×3): qty 1

## 2014-11-12 MED ORDER — LANTHANUM CARBONATE 500 MG PO CHEW
500.0000 mg | CHEWABLE_TABLET | Freq: Three times a day (TID) | ORAL | Status: DC
  Administered 2014-11-13 – 2014-11-14 (×4): 500 mg via ORAL
  Filled 2014-11-12 (×7): qty 1

## 2014-11-12 MED ORDER — INSULIN GLARGINE 100 UNIT/ML ~~LOC~~ SOLN
4.0000 [IU] | Freq: Two times a day (BID) | SUBCUTANEOUS | Status: DC
  Administered 2014-11-12: 4 [IU] via SUBCUTANEOUS
  Filled 2014-11-12: qty 0.04

## 2014-11-12 MED ORDER — ONDANSETRON HCL 4 MG/2ML IJ SOLN: 4.0000 mg | Freq: Four times a day (QID) | INTRAMUSCULAR | Status: DC | PRN

## 2014-11-12 MED ORDER — HEPARIN SODIUM (PORCINE) 5000 UNIT/ML IJ SOLN: 5000.0000 [IU] | Freq: Three times a day (TID) | INTRAMUSCULAR | Status: DC

## 2014-11-12 MED ORDER — ONDANSETRON HCL 4 MG PO TABS
4.0000 mg | ORAL_TABLET | Freq: Four times a day (QID) | ORAL | Status: DC | PRN
  Administered 2014-11-12: 4 mg via ORAL
  Filled 2014-11-12: qty 1

## 2014-11-12 MED ORDER — INSULIN GLARGINE 100 UNIT/ML ~~LOC~~ SOLN
8.0000 [IU] | Freq: Two times a day (BID) | SUBCUTANEOUS | Status: DC
  Filled 2014-11-12: qty 0.08

## 2014-11-12 MED ORDER — HEPARIN (PORCINE) IN NACL 100-0.45 UNIT/ML-% IJ SOLN
900.0000 [IU]/h | INTRAMUSCULAR | Status: DC
  Administered 2014-11-12: 900 [IU]/h via INTRAVENOUS
  Filled 2014-11-12: qty 250

## 2014-11-12 MED ORDER — DARBEPOETIN ALFA 60 MCG/0.3ML IJ SOSY
60.0000 ug | PREFILLED_SYRINGE | INTRAMUSCULAR | Status: DC
  Administered 2014-11-14: 60 ug via INTRAVENOUS
  Filled 2014-11-12: qty 0.3

## 2014-11-12 MED ORDER — ACETAMINOPHEN 650 MG RE SUPP: 650.0000 mg | Freq: Four times a day (QID) | RECTAL | Status: DC | PRN

## 2014-11-12 MED ORDER — MORPHINE SULFATE 2 MG/ML IJ SOLN: 1.0000 mg | INTRAMUSCULAR | Status: DC | PRN

## 2014-11-12 MED ORDER — SODIUM CHLORIDE 0.9 % IJ SOLN
3.0000 mL | Freq: Two times a day (BID) | INTRAMUSCULAR | Status: DC
  Administered 2014-11-13: 3 mL via INTRAVENOUS

## 2014-11-12 MED ORDER — LEVOTHYROXINE SODIUM 100 MCG PO TABS
100.0000 ug | ORAL_TABLET | Freq: Every day | ORAL | Status: DC
  Administered 2014-11-13 – 2014-11-14 (×2): 100 ug via ORAL
  Filled 2014-11-12 (×3): qty 1

## 2014-11-12 MED ORDER — ACETAMINOPHEN 325 MG PO TABS: 650.0000 mg | ORAL_TABLET | Freq: Four times a day (QID) | ORAL | Status: DC | PRN

## 2014-11-12 MED ORDER — DOXERCALCIFEROL 4 MCG/2ML IV SOLN
3.0000 ug | INTRAVENOUS | Status: DC
  Administered 2014-11-14: 3 ug via INTRAVENOUS
  Filled 2014-11-12: qty 2

## 2014-11-12 MED ORDER — LANTHANUM CARBONATE 500 MG PO CHEW: 1000.0000 mg | CHEWABLE_TABLET | ORAL | Status: DC | PRN

## 2014-11-12 MED ORDER — LISINOPRIL 20 MG PO TABS
20.0000 mg | ORAL_TABLET | Freq: Two times a day (BID) | ORAL | Status: DC
  Administered 2014-11-12 – 2014-11-14 (×4): 20 mg via ORAL
  Filled 2014-11-12 (×5): qty 1

## 2014-11-12 MED ORDER — BISACODYL 10 MG RE SUPP
10.0000 mg | Freq: Every day | RECTAL | Status: DC | PRN
  Administered 2014-11-12: 10 mg via RECTAL
  Filled 2014-11-12 (×2): qty 1

## 2014-11-12 MED ORDER — PANTOPRAZOLE SODIUM 40 MG PO TBEC
40.0000 mg | DELAYED_RELEASE_TABLET | Freq: Every day | ORAL | Status: DC
  Administered 2014-11-13 – 2014-11-14 (×2): 40 mg via ORAL
  Filled 2014-11-12 (×2): qty 1

## 2014-11-12 MED ORDER — SENNOSIDES-DOCUSATE SODIUM 8.6-50 MG PO TABS: 1.0000 | ORAL_TABLET | Freq: Every evening | ORAL | Status: DC | PRN

## 2014-11-12 MED ORDER — INSULIN ASPART 100 UNIT/ML ~~LOC~~ SOLN
0.0000 [IU] | Freq: Three times a day (TID) | SUBCUTANEOUS | Status: DC
  Administered 2014-11-12: 11 [IU] via SUBCUTANEOUS
  Filled 2014-11-12: qty 1

## 2014-11-12 MED ORDER — INSULIN ASPART 100 UNIT/ML ~~LOC~~ SOLN
0.0000 [IU] | Freq: Three times a day (TID) | SUBCUTANEOUS | Status: DC
  Administered 2014-11-12: 15 [IU] via SUBCUTANEOUS

## 2014-11-12 MED ORDER — HEPARIN BOLUS VIA INFUSION
4000.0000 [IU] | Freq: Once | INTRAVENOUS | Status: AC
  Administered 2014-11-12: 4000 [IU] via INTRAVENOUS
  Filled 2014-11-12: qty 4000

## 2014-11-12 MED ORDER — SODIUM CHLORIDE 0.9 % IJ SOLN
3.0000 mL | Freq: Two times a day (BID) | INTRAMUSCULAR | Status: DC
  Administered 2014-11-12 – 2014-11-14 (×2): 3 mL via INTRAVENOUS

## 2014-11-12 MED ORDER — AMLODIPINE BESYLATE 10 MG PO TABS
10.0000 mg | ORAL_TABLET | Freq: Two times a day (BID) | ORAL | Status: DC
  Administered 2014-11-12 – 2014-11-14 (×4): 10 mg via ORAL
  Filled 2014-11-12 (×5): qty 1

## 2014-11-12 MED ORDER — SODIUM CHLORIDE 0.9 % IJ SOLN
3.0000 mL | INTRAMUSCULAR | Status: DC | PRN
  Administered 2014-11-12: 3 mL via INTRAVENOUS

## 2014-11-12 NOTE — Progress Notes (Signed)
ANTICOAGULATION CONSULT NOTE - Follow Up Consult  Pharmacy Consult for Heparin  Indication: chest pain/ACS  No Known Allergies  Patient Measurements: Height: 5\' 3"  (160 cm) Weight: 130 lb 1.1 oz (59 kg) IBW/kg (Calculated) : 52.4 Vital Signs: Temp: 99.5 F (37.5 C) (02/07 2003) Temp Source: Rectal (02/07 1104) BP: 152/78 mmHg (02/07 2003) Pulse Rate: 83 (02/07 2003)  Labs:  Recent Labs  11/12/14 0621 11/12/14 1349 11/12/14 1520 11/12/14 1942 11/12/14 2230  HGB 13.5 13.1  --   --   --   HCT 40.1 38.2  --   --   --   PLT 186 195  --   --   --   LABPROT  --   --  13.4  --   --   INR  --   --  1.01  --   --   HEPARINUNFRC  --   --   --   --  0.69  CREATININE 6.94* 7.87*  --  8.67*  --   TROPONINI  --  <0.03  --  <0.03  --     Estimated Creatinine Clearance: 7.8 mL/min (by C-G formula based on Cr of 8.67).  Assessment: Therapeutic heparin level x 1  Goal of Therapy:  Heparin level 0.3-0.7 units/ml Monitor platelets by anticoagulation protocol: Yes   Plan:  -Continue heparin at 900 units/hr -AM HL -Daily CBC/HL -Monitor for bleeding  Abran DukeLedford, Dorrance Sellick 11/12/2014,11:03 PM

## 2014-11-12 NOTE — ED Notes (Addendum)
Pt sts she feels a "burning" in her stomach.  Sts she is concerned about issues with not being able to have proper bowel movements.  Sts she has tried enemas and senna to correct without success.  Sts "my stomach feels full all the time".   Pt sts her most recent admission for DKA was deemed "by the doctors" to be "because I was constipated".

## 2014-11-12 NOTE — Progress Notes (Signed)
Received from ED via stretcher. Belongings sent with patient (see valuables on admission).No family/friends with patient. No meds brought from home.

## 2014-11-12 NOTE — ED Notes (Addendum)
Second IV attempt made to receive blood cultures (set #2) was unsuccessful. Saa with phlebotomy notified. Will come to pt room and attempt to receive blood cultures.

## 2014-11-12 NOTE — ED Notes (Signed)
IV team at bedside, pt will have IV placed then pt will head up to 6E room 18.

## 2014-11-12 NOTE — ED Notes (Signed)
Pt refusing to eat at this time without receiving her insulin. Spoke with PA regarding pt concern. PA consulting family practice.

## 2014-11-12 NOTE — ED Provider Notes (Signed)
CSN: [id number]     Arrival date & time 11/12/14  0550 History   First MD Initiated Contact with Patient 11/12/14 [phone]     Chief Complaint  Patient presents with  . Altered Mental Status  . Hypoglycemia     (Consider location/radiation/quality/duration/timing/severity/associated sxs/prior Treatment) Patient is a 34 y.o. female presenting with altered mental status and hypoglycemia. The history is provided by the patient and medical records.  Altered Mental Status Hypoglycemia Associated symptoms: altered mental status    This is a 34 y.o. F with PMH significant for HTN, DM2, HLP, ESRD on hemodialysis with last treatment yesterday,presenting to the ED for hypoglycemia.  Patient's roommate noticed that she appeared somewhat lethargic and called EMS.  On EMS arrival, CBG was 18, given D50 with improvement to 225.  Patient remained lethargic en route to hospital.  On arrival to ED, she begins screaming "stop stabbing me" when attempting to hook her up to BP cuff and tele monitor.  Once calmed, she is able to sit up and answer questions appropriately.  Repeat CBG 150 on arrival.  Patient does not some chest pressure, denies SOB, palpitations, dizziness, weakness.  Patient has no prior cardiac hx.  Past Medical History  Diagnosis Date  . 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 Hypothyroidism   . Hypertension   . Type I diabetes mellitus   . DVT (deep venous thrombosis)     "both arms" (11/11/2013)  . High cholesterol   . Diabetic retinopathy associated with type 1 diabetes mellitus   . Heart murmur     "slight" (11/11/2013)  . Graves' disease     "had thyroid radioactively treated" (11/11/2013)  . End-stage renal disease on hemodialysis     "TTS; [facility]" (11/11/2013)   Past Surgical History  Procedure Laterality Date  . Insertion of dialysis catheter N/A 04/01/2013    Procedure: INSERTION OF DIALYSIS CATHETER;  Surgeon: Pryor Ochoa, MD;  Location: [facility] OR;  Service:  Vascular;  Laterality: N/A;  . Av fistula placement Right 04/07/2013    Procedure: INSERTION OF ARTERIOVENOUS (AV) GORE-TEX GRAFT THIGH;  Surgeon: Chuck Hint, MD;  Location: [facility] OR;  Service: Vascular;  Laterality: Right;  . Cataract extraction w/ intraocular lens implant Right 2009  . Eye surgery Right 2009    "laser OR for diabetic retinopathy" (05/23/2013)  . Enucleation Left ~ 2010  . Revision of arteriovenous goretex graft Right 11/11/2013    Procedure: REVISION OF ARTERIOVENOUS GORETEX GRAFT - RIGHT THIGH;  Surgeon: Chuck Hint, MD;  Location: [facility] OR;  Service: Vascular;  Laterality: Right;  [name] Bilateral 04/04/2013    Procedure: VENOGRAM;  Surgeon: Chuck Hint, MD;  Location: [facility] CATH LAB;  Service: Cardiovascular;  Laterality: Bilateral;   Family History  Problem Relation Age of Onset  . Hypertension    . Cancer    . Thyroid disease    . Cancer Mother   . Asthma Father    History  Substance Use Topics  . Smoking status: Never Smoker   . Smokeless tobacco: Never Used  . Alcohol Use: No   OB History    No data available     Review of Systems  Cardiovascular: Positive for chest pain (pressure).  Endocrine:       Hypoglycemia  All other systems reviewed and are negative.     Allergies  Review of patient's allergies indicates no known allergies.  Home Medications  Prior to Admission medications   Medication Sig Start Date End Date Taking? Authorizing Provider  amLODipine (NORVASC) 10 MG tablet Take 10 mg by mouth 2 (two) times daily.    Historical Provider, MD  aspirin 81 MG chewable tablet Chew 1 tablet (81 mg total) by mouth daily. Patient not taking: Reported on 10/24/2014 06/30/14   Lora Havensaleigh N Rumley, DO  carvedilol (COREG) 25 MG tablet Take 25 mg by mouth every morning.    Historical Provider, MD  gabapentin (NEURONTIN) 300 MG capsule Take 1 capsule (300 mg total) by mouth daily. Patient not taking: Reported on 10/24/2014 09/27/14    Raliegh IpAshly M Gottschalk, DO  insulin aspart (NOVOLOG) 100 UNIT/ML injection Inject 7 Units into the skin 3 (three) times daily with meals. 10/29/14   Shirlee LatchAngela Bacigalupo, MD  insulin glargine (LANTUS) 100 UNIT/ML injection Inject 0.1 mLs (10 Units total) into the skin 2 (two) times daily. 10/29/14   Shirlee LatchAngela Bacigalupo, MD  levothyroxine (SYNTHROID, LEVOTHROID) 100 MCG tablet Take 1 tablet by mouth  daily 09/11/14   Tyrone Nineyan B Grunz, MD  lisinopril (PRINIVIL,ZESTRIL) 20 MG tablet Take 20 mg by mouth 2 (two) times daily.    Historical Provider, MD  multivitamin (RENA-VIT) TABS tablet Take 1 tablet by mouth at bedtime. Patient not taking: Reported on 10/24/2014 09/27/14   Ashly M Gottschalk, DO  pantoprazole (PROTONIX) 40 MG tablet Take 1 tablet (40 mg total) by mouth daily. Patient not taking: Reported on 10/24/2014 06/30/14   [place] N Rumley, DO   BP 123/68 mmHg  Pulse 77  Temp(Src) 97.4 F (36.3 C) (Rectal)  Resp 8  Ht 5\' 3"  (1.6 m)  Wt 132 lb 7.9 oz (60.099 kg)  BMI 23.48 kg/m2  SpO2 99%  LMP 10/16/2014   Physical Exam  Constitutional: She is oriented to person, place, and time. She appears well-developed and well-nourished. No distress.  Curled up in bed, clothes wet with sweat  HENT:  Head: Normocephalic and atraumatic.  Mouth/Throat: Oropharynx is clear and moist.  Eyes: Conjunctivae and EOM are normal. Pupils are equal, round, and reactive to light.  Neck: Normal range of motion. Neck supple.  Cardiovascular: Normal rate, regular rhythm and normal heart sounds.   Pulmonary/Chest: Effort normal and breath sounds normal. No respiratory distress. She has no wheezes.  Abdominal: Soft. Bowel sounds are normal. There is no tenderness. There is no guarding.  Musculoskeletal: Normal range of motion.  Neurological: She is alert and oriented to person, place, and time.  AAOx3 although somewhat lethargic, answering questions and following commands appropriately; equal strength UE and LE bilaterally; CN  grossly intact; moves all extremities appropriately without ataxia; no focal neuro deficits or facial asymmetry appreciated  Skin: Skin is warm and dry. She is not diaphoretic.  Psychiatric: She has a normal mood and affect.  Nursing note and vitals reviewed.   ED Course  Procedures (including critical care time) Labs Review Labs Reviewed  BASIC METABOLIC PANEL - Abnormal; Notable for the following:    Glucose, Bld 125 (*)    BUN 28 (*)    Creatinine, Ser 6.94 (*)    GFR calc non Af Amer 7 (*)    GFR calc Af Amer 8 (*)    All other components within normal limits  URINALYSIS, ROUTINE W REFLEX MICROSCOPIC - Abnormal; Notable for the following:    APPearance CLOUDY (*)    Glucose, UA 100 (*)    Hgb urine dipstick SMALL (*)    Protein, ur >300 (*)  All other components within normal limits  URINE MICROSCOPIC-ADD ON - Abnormal; Notable for the following:    Squamous Epithelial / LPF MANY (*)    Bacteria, UA FEW (*)    All other components within normal limits  CBG MONITORING, ED - Abnormal; Notable for the following:    Glucose-Capillary 125 (*)    All other components within normal limits  CBG MONITORING, ED - Abnormal; Notable for the following:    Glucose-Capillary 134 (*)    All other components within normal limits  CULTURE, BLOOD (ROUTINE X 2)  CULTURE, BLOOD (ROUTINE X 2)  CBC WITH DIFFERENTIAL/PLATELET  ETHANOL  URINE RAPID DRUG SCREEN (HOSP PERFORMED)  I-STAT TROPOININ, ED  I-STAT CG4 LACTIC ACID, ED  I-STAT CG4 LACTIC ACID, ED    Imaging Review Dg Chest 2 View  11/12/2014   CLINICAL DATA:  Altered mental status.  Hypoglycemia.  EXAM: CHEST  2 VIEW  COMPARISON:  10/24/2014; 06/28/2014; 06/26/2014  FINDINGS: Borderline enlarged cardiac silhouette, possibly accentuated due to decreased lung volumes. Normal mediastinal contours. No focal airspace opacities. No pleural effusion or pneumothorax. No evidence of edema. No acute osseus abnormalities.  IMPRESSION: Borderline  cardiomegaly without acute cardiopulmonary disease.   Electronically Signed   By: Simonne Come M.D.   On: 11/12/2014 08:10     EKG Interpretation   Date/Time:  Sunday November 12 2014 06:12:41 EST Ventricular Rate:  63 PR Interval:  175 QRS Duration: 75 QT Interval:  490 QTC Calculation: 502 R Axis:   49 Text Interpretation:  Sinus rhythm Probable left atrial enlargement  Inferior infarct, acute (LCx) Lateral leads are also involved Prolonged QT  interval Since previous tracing QT is longer, and t waves are less  prominent Confirmed by Karma Ganja  MD, MARTHA [phone]) on 11/12/2014 6:17:11 AM      MDM   Final diagnoses:  Altered mental status  Hypoglycemia  Hypothermia, initial encounter   34 year old female presenting with hypoglycemia and lethargy. EMS was called by patient's roommate. On arrival, patient does appear somewhat lethargic and initially continue screaming "stop stabbing me". After patient was called she is able sit upright in bed, answer questions and follow commands without difficulty. She is neurologically intact. She does have a noted temperature of 92.48F, was checked multiple times. Patient's clothes are soaked in sweat and was outside for short period of time with EMS.  Bair hugger applied.  Work-up pending.  Repeat CBG 150 currently.  EKG interpreted as acute infarct, however Dr. Karma Ganja has reviewed this and does not feel that EKG resembles MI.  Labwork as above, appears baseline for patient when compared with previous. Her lactate is within normal limits. Blood cultures pending. Chest x-ray negative. U/a appears contaminated.  After period of time on Bair hugger and close observation, her rectal temperature has returned to normal limits.  She has remained alert and oriented.  TSH added to work-up.  1130-- patient reassessed.  She has been resting comfortably.  Temperature remains WNL.  States she feels somewhat generally weak and sleepy.  Repeat CBG 245.  Patient remains  alert, continues answering questions.  Given patient's hypothermia without known cause have discussed case with family practice service who has evaluated patient in the ED and will admit for observation.  2:29 PM Notified by family practice that they are concerned with patient's EKG findings being read as acute MI and are planning to start patient on heparin. Discussed with resident that Dr. Karma Ganja and i discussed EKG and did not  feel that EKG truly represented acute MI.  Troponin also negative.  EKG read was validated in MUSE without removal of acute MI in interpretation, feel this was done in accidental error.  Recommended repeat EKG, trend troponin.  I have also relayed this information to cardiology, Dr. Ladona Ridgel.  Garlon Hatchet, PA-C 11/12/14 1515  Ethelda Chick, MD 11/12/14 [phone]

## 2014-11-12 NOTE — H&P (Signed)
Family Medicine Teaching [facility] Admission History and Physical Service Pager: [phone]  Patient name: [name] Medical record number: [id number] Date of birth: Oct 18, 1980 Age: 34 y.o. Gender: female  Primary Care Provider: Hazeline Junker, MD Consultants: Nephrology Code Status: Full   Chief Complaint: Hypoglycemia with hypothermia  Assessment and Plan: [name] is a 34 y.o. female presenting with hyperglycemia and hypoglycemia. PMH is significant for type 1 diabetes, diabetic retinopathy/blind, frequent DKA, DVT of upper extremity, end-stage renal disease on HD, hypertension.  Hypoglycemia/DM type I: Brittle diabetic history. Reported found lethargic by roommate, EMS called CBG 18, treated in route to ED. ED CBG 125. Pt states she has been compliant with medications and using as directed. Her appetite has been mildly decreased and she is only eating 2 meals a day.  - SSI, will calculate need over 24 hours to adjust home medications.  - Carb modified diet - hypoglycemia protocol.   Hypothermia: Unknown cause of hypothermia, considering hypothyroidism vs infection. Patient temp on ED arrival 92.3. She was placed on warming blanket, current temperature 97.4. WBC 6.7. - Beir hugger as needed - TSH pending - Cycle trop - EKG in ED: ?inferior infarct, prolonged QT>>> start IV heparin drip - Consult cardiology - EKG PRN for chest pain and in the AM.  - Lactic acid normal  ESRD on HD- Receives dialysis on Tuesday, Thursday, Saturday. Last dialysis was yesterday.  - Cr on admission 6.94, BUN 28 - Electrolytes abnormalities listed above - Nephrology consulted. Appreciate recommendations. - Renal panel after HD tomorrow  Abdominal pain/constipation: CT on last admission positive for fluid overload, and fatty liver. WBC normal. Appears epigastric in nature. WBC within normal limits, troponin negative, white count normal. Lactic acid normal. - KUB today >> moderate stool  burden - Bowel regimen: Senna daily, suppository PRN  # Hypothyroidism: Considering hypothermia will check TSH - TSH pending.  - Continue home medication of Levothyroxine  # HTN:  - Home medications include Amlodipine 10mg , Coreg 25mg  BID, and Lisinopril.  - Continue Amlodipine, Coreg, and Lisinopril   # Suicidal Ideation- Noted on a hospitalization prior  - Continue to monitor throughout hospitalization  FEN/GI: Carb Modified diet Prophylaxis: Subcutaneous Heparin  Disposition: Admitted to [facility] Medicine Teaching Service .Dr.Neal attending.    History of Present Illness: [name] is a 34 y.o. female presenting with hypoglycemia and hypothermia, found lethargic at home by roommate. EMS was called and CBG was 18, pt was given treatment. Upon arrival to ED CBG was 125. Pt was hypothermic with temperature of 92.3. Warming blanket was applied and her temperature steadily raised to 97.5. Pt became alert and oriented in the ED. Pt states she is uncertain of anything that happened after she went to bed last night. The next thing she remembers is EMS at her home. Pt reports compliance with insulin regimen. Patient had dialysis yesterday, which she state which well, however nephrology notes suggest that she had hypoglycemia with dialysis, which she received dextrose during HD. When she got home she took 7 units of Lantus with dinner, she states she felt good, and she went to bed.  Patient states she doesn't feel that her brittle diabetes, has anything to do with sexual diabetes and has to do with her "stomach problems ". She has frequent constipation, having a bowel movement 1-2 times a week. She states she had used Miralax, but had stopped using it because she didn't think it worked. She also started drinking senna tea, but didn't feel was  very helpful and had to take in more fluids. He has also tried eating prunes, that didn't seem to help her. She is frequently nauseated, and  now can only eat about twice a day due to decreased appetite and nausea. She continues to take 8 units of Lantus twice a day, 7 units of NovoLog with each meal (3 times a day), in addition to a sliding scale of 1 unit for 150/50. Patient has been a type I diabetic for approximately 20 years, diagnosed at age 34.  Review Of Systems: Per HPI with the following additions: per HPI Otherwise 12 point review of systems was performed and was unremarkable.  Patient Active Problem List   Diagnosis Date Noted  . Constipation   . Abdominal pain   . Hyponatremia   . Nausea with vomiting   . Type 1 diabetes mellitus with complications   . Hypertensive urgency   . ESRD on dialysis   . Hyperkalemia   . Hyperglycemia   . Abdominal pain, acute   . Diabetic ketoacidosis without coma associated with diabetes mellitus due to underlying condition   . DKA (diabetic ketoacidoses) 10/24/2014  . Major depressive disorder, recurrent, severe without psychotic features   . ESRD (end stage renal disease) 09/27/2014  . Hip pain   . Recurrent major depression-severe 09/26/2014  . Suicidal behavior 09/25/2014  . Suicidal ideation 09/25/2014  . Peripheral polyneuropathy 09/19/2014  . HTN (hypertension), malignant 06/29/2014  . Abnormal EKG 06/29/2014  . Chest pain with moderate risk of acute coronary syndrome 06/28/2014  . Atypical chest pain 06/28/2014  . ESRD (end stage renal disease) on dialysis 11/11/2013  . Hypothyroidism 06/10/2013  . Diabetic retinopathy-Blind 04/21/2013  . Essential hypertension, benign 04/21/2013  . DVT of upper extremity (deep vein thrombosis) 04/02/2013  . DM (diabetes mellitus), type 1 04/02/2013   Past Medical History: Past Medical History  Diagnosis Date  . 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 Hypothyroidism   . Hypertension   . Type I diabetes mellitus   . DVT (deep venous thrombosis)     "both arms" (11/11/2013)  . High cholesterol   . Diabetic  retinopathy associated with type 1 diabetes mellitus   . Heart murmur     "slight" (11/11/2013)  . Graves' disease     "had thyroid radioactively treated" (11/11/2013)  . End-stage renal disease on hemodialysis     "TTS; [facility]" (11/11/2013)   Past Surgical History: Past Surgical History  Procedure Laterality Date  . Insertion of dialysis catheter N/A 04/01/2013    Procedure: INSERTION OF DIALYSIS CATHETER;  Surgeon: Pryor OchoaJames D Lawson, MD;  [facility];  Service: Vascular;  Laterality: N/A;  . Av fistula placement Right 04/07/2013    Procedure: INSERTION OF ARTERIOVENOUS (AV) GORE-TEX GRAFT THIGH;  Surgeon: Chuck Hinthristopher S Dickson, MD;  [facility];  Service: Vascular;  Laterality: Right;  . Cataract extraction w/ intraocular lens implant Right 2009  . Eye surgery Right 2009    "laser OR for diabetic retinopathy" (05/23/2013)  . Enucleation Left ~ 2010  . Revision of arteriovenous goretex graft Right 11/11/2013    Procedure: REVISION OF ARTERIOVENOUS GORETEX GRAFT - RIGHT THIGH;  Surgeon: Chuck Hinthristopher S Dickson, MD;  [facility];  Service: Vascular;  Laterality: Right;  [name]. Venogram Bilateral 04/04/2013    Procedure: VENOGRAM;  Surgeon: Chuck Hinthristopher S Dickson, MD;  [facility];  Service: Cardiovascular;  Laterality: Bilateral;   Social History: History  Substance Use Topics  . Smoking status: Never Smoker   . Smokeless tobacco: Never Used  . Alcohol Use: No   Additional social history: per HPI Please also refer to relevant sections of EMR.  Family History: Family History  Problem Relation Age of Onset  . Hypertension    . Cancer    . Thyroid disease    . Cancer Mother   . Asthma Father    Allergies and Medications: No Known Allergies No current facility-administered medications on file prior to encounter.   Current Outpatient Prescriptions on File Prior to Encounter  Medication Sig Dispense Refill  . amLODipine (NORVASC) 10 MG tablet Take 10 mg by mouth 2 (two)  times daily.    . carvedilol (COREG) 25 MG tablet Take 25 mg by mouth every morning.    . insulin aspart (NOVOLOG) 100 UNIT/ML injection Inject 7 Units into the skin 3 (three) times daily with meals. 10 mL 11  . insulin glargine (LANTUS) 100 UNIT/ML injection Inject 0.1 mLs (10 Units total) into the skin 2 (two) times daily. (Patient taking differently: Inject 8 Units into the skin 2 (two) times daily. ) 10 mL 11  . levothyroxine (SYNTHROID, LEVOTHROID) 100 MCG tablet Take 1 tablet by mouth  daily 90 tablet 3  . lisinopril (PRINIVIL,ZESTRIL) 20 MG tablet Take 20 mg by mouth 2 (two) times daily.    . multivitamin (RENA-VIT) TABS tablet Take 1 tablet by mouth at bedtime. 30 tablet 1  . pantoprazole (PROTONIX) 40 MG tablet Take 1 tablet (40 mg total) by mouth daily. 30 tablet 0  . aspirin 81 MG chewable tablet Chew 1 tablet (81 mg total) by mouth daily. (Patient not taking: Reported on 10/24/2014) 30 tablet 0  . gabapentin (NEURONTIN) 300 MG capsule Take 1 capsule (300 mg total) by mouth daily. (Patient not taking: Reported on 10/24/2014) 30 capsule 3    Objective: BP 133/80 mmHg  Pulse 81  Temp(Src) 97.4 F (36.3 C) (Rectal)  Resp 20  Ht  (1.6 m)  Wt 132 lb 7.9 oz (60.099 kg)  BMI 23.48 kg/m2  SpO2 100%  LMP 10/16/2014 Exam: Gen: NAD. Cooperative with exam, appears frustrated. Well-developed, well-nourished.  HEENT: AT. [place].Bilateral eyes with injections, no icterus. Mildly dry mucous membranes. CV: RRR. Murmur appreciated. Chest: CTAB, no wheeze or crackles. Normal work of breath Abd: Soft. Mild discomfort diffusely. ND. BS hypoactive. No Masses palpated.  Ext: No erythema. No edema. +2/4 PT Skin: Hyperpigmentation/healing rash right side of abdomen. Warm and well-perfused. Skin intact.   Neuro: PERLA. EOMi. Alert. Oriented. Strength 5/5 upper and lower extremity. No focal deficits.   Labs and Imaging: CBC BMET   Recent Labs Lab 11/12/14 0621  WBC 6.7  HGB 13.5  HCT 40.1   PLT 186    Recent Labs Lab 11/12/14 0621  NA 143  K 4.0  CL 100  CO2 31  BUN 28*  CREATININE 6.94*  GLUCOSE 125*  CALCIUM 9.2     EKG: reading as inferior infarct, acute with lateral lead involvement.  Prolong QT. This is a change from prior EKG>  Trop: istat neg  Dg Chest 2 View  11/12/2014   CLINICAL DATA:  Altered mental status.  Hypoglycemia.  EXAM: CHEST  2 VIEW  COMPARISON:  10/24/2014; 06/28/2014; 06/26/2014  FINDINGS: Borderline enlarged cardiac silhouette, possibly accentuated due to decreased lung volumes. Normal mediastinal contours. No focal airspace opacities. No pleural effusion or pneumothorax. No evidence of edema. No acute osseus abnormalities.  IMPRESSION: Borderline cardiomegaly without acute cardiopulmonary disease.   Electronically Signed   By: Simonne Come M.D.   On: 11/12/2014 08:10     Natalia Leatherwood, DO 11/12/2014, 12:45 PM PGY-3, [facility] FPTS Intern pager: [phone], text pages welcome

## 2014-11-12 NOTE — ED Notes (Addendum)
Bair  hugger placed back on pt .

## 2014-11-12 NOTE — ED Notes (Signed)
Meal tray ordered for pt. Per PA, pt needs to ambulate in hallway before discharge.

## 2014-11-12 NOTE — ED Notes (Signed)
Attempted rectal temp reading 92 degree Farenheit.  Attempted again 5 minutes later with same thermometer, same reading (92 degrees).  Brought second thermometer to room to recheck, thermometer reading 92.3 degrees.  MD (Dr. Karma GanjaLinker) made aware.  Bair hugger obtained and applied on low setting.

## 2014-11-12 NOTE — ED Notes (Signed)
Attempted IV times two unsuccessfully  

## 2014-11-12 NOTE — Progress Notes (Signed)
ANTICOAGULATION CONSULT NOTE - Initial Consult  Pharmacy Consult for heparin Indication: chest pain/ACS  No Known Allergies  Patient Measurements: Height: 5\' 3"  (160 cm) Weight: 132 lb 7.9 oz (60.099 kg) IBW/kg (Calculated) : 52.4 Heparin Dosing Weight: 60 kg  Vital Signs: Temp: 97.4 F (36.3 C) (02/07 1104) Temp Source: Rectal (02/07 1104) BP: 138/79 mmHg (02/07 1415) Pulse Rate: 81 (02/07 1415)  Labs:  Recent Labs  11/12/14 0621 11/12/14 1349  HGB 13.5 13.1  HCT 40.1 38.2  PLT 186 195  CREATININE 6.94*  --     Estimated Creatinine Clearance: 9.7 mL/min (by C-G formula based on Cr of 6.94).   Medical History: Past Medical History  Diagnosis Date  . 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 Hypothyroidism   . Hypertension   . Type I diabetes mellitus   . DVT (deep venous thrombosis)     "both arms" (11/11/2013)  . High cholesterol   . Diabetic retinopathy associated with type 1 diabetes mellitus   . Heart murmur     "slight" (11/11/2013)  . Graves' disease     "had thyroid radioactively treated" (11/11/2013)  . End-stage renal disease on hemodialysis     "TTS; [facility]" (11/11/2013)    Medications:  Scheduled:  . amLODipine  10 mg Oral BID  . aspirin  325 mg Oral Daily  . carvedilol  25 mg Oral q morning - 10a  . heparin  5,000 Units Subcutaneous 3 times per day  . insulin aspart  0-15 Units Subcutaneous TID WC  . [START ON 11/13/2014] levothyroxine  100 mcg Oral QAC breakfast  . lisinopril  20 mg Oral BID  . multivitamin  1 tablet Oral QHS  . pantoprazole  40 mg Oral Daily  . sodium chloride  3 mL Intravenous Q12H  . sodium chloride  3 mL Intravenous Q12H   Infusions:  . sodium chloride      Assessment: 34 yo who was seen in the ED for AMS. Pt has a hx DM and ESRD. She was found with hypoglycemia. She is now also being r/o for MI. IV heparin has been ordered for anticoagulation. She is not on any anticoagulant PTA.   Goal of  Therapy:  Heparin level 0.3-0.7 units/ml Monitor platelets by anticoagulation protocol: Yes   Plan:   Heparin bolus 4000 units x1 Heparin drip at 900 units/hr F/u with 8 hr heparin level Daily level and CBC  Ulyses SouthwardMinh Pham, PharmD Pager: [phone] 11/12/2014 2:41 PM

## 2014-11-12 NOTE — ED Notes (Signed)
CBG at this time 134

## 2014-11-12 NOTE — ED Notes (Signed)
Patient difficult to rouse.  Pt screaming and expressing fear that "please don't stab me".  PA re-acclamates pt to situation stating "no one is touching you".  Pt is curled up in bed and moaning, stating she is having pain but unable to tell us where.

## 2014-11-12 NOTE — ED Notes (Signed)
Pt sts at her dialysis treatment on 11/11/14 she was experiencing fluctuating CBGs, reaching down to 42, but ending her treatment at 101, so she was allowed to go home.

## 2014-11-12 NOTE — ED Notes (Signed)
Per EMS:  Pt from home where he roommate found her altered and called EMS.  Pt with a hx of diabetes and dialysis.  Pt found with CBG of 18 on scene, given D50 and brought her CBG to 225.  Upon arrival they got a CBG of 150.  Pt is legally blind and has a shunt in her right leg.  Pt brady during transport, BP WDL.  Pt lethargic during transport.

## 2014-11-12 NOTE — Consult Note (Signed)
Indication for Consultation:  Management of ESRD/hemodialysis; anemia, hypertension/volume and secondary hyperparathyroidism  HPI: [name] is a 34 y.o. female who was brought in by EMS this AM after being found with altered mental status by her roommate, she does not remember anything until EMS arrived. She recieves [facility] @ [facility], history of HTN, DM, legally blind, last HD yesterday. She reports BG was 120 yesterday AM, she took her lantus and aspart with breakfast. She did not eat lunch, when she got to HD BG had dropped to 27, she received dextrose repeat BG during HD are as followed 24 -> 73  -> 93-> 78 ->101 She did not recheck her glucose when she got home but took lantus and 7 u aspart with dinner, she felt fine when she went to bed, Currently CBG 302, symptoms of hypoglycemia resolved, Will keep on schedule for HD while in the hospital.    Past Medical History  Diagnosis Date  . 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 Hypothyroidism   . Hypertension   . Type I diabetes mellitus   . DVT (deep venous thrombosis)     "both arms" (11/11/2013)  . High cholesterol   . Diabetic retinopathy associated with type 1 diabetes mellitus   . Heart murmur     "slight" (11/11/2013)  . Graves' disease     "had thyroid radioactively treated" (11/11/2013)  . End-stage renal disease on hemodialysis     "TTS; [facility]" (11/11/2013)   Past Surgical History  Procedure Laterality Date  . Insertion of dialysis catheter N/A 04/01/2013    Procedure: INSERTION OF DIALYSIS CATHETER;  Surgeon: Pryor Ochoa, MD;  Location: [facility] OR;  Service: Vascular;  Laterality: N/A;  . Av fistula placement Right 04/07/2013    Procedure: INSERTION OF ARTERIOVENOUS (AV) GORE-TEX GRAFT THIGH;  Surgeon: Chuck Hint, MD;  Location: [facility] OR;  Service: Vascular;  Laterality: Right;  . Cataract extraction w/ intraocular lens implant Right 2009  . Eye surgery Right 2009    "laser OR for diabetic retinopathy"  (05/23/2013)  . Enucleation Left ~ 2010  . Revision of arteriovenous goretex graft Right 11/11/2013    Procedure: REVISION OF ARTERIOVENOUS GORETEX GRAFT - RIGHT THIGH;  Surgeon: Chuck Hint, MD;  Location: [facility] OR;  Service: Vascular;  Laterality: Right;  [name] Bilateral 04/04/2013    Procedure: VENOGRAM;  Surgeon: Chuck Hint, MD;  Location: [facility] CATH LAB;  Service: Cardiovascular;  Laterality: Bilateral;   Family History  Problem Relation Age of Onset  . Hypertension    . Cancer    . Thyroid disease    . Cancer Mother   . Asthma Father    Social History:  reports that she has never smoked. She has never used smokeless tobacco. She reports that she does not drink alcohol or use illicit drugs.   Lives with friend "couch surfing"  No Known Allergies Prior to Admission medications   Medication Sig Start Date End Date Taking? Authorizing Provider  amLODipine (NORVASC) 10 MG tablet Take 10 mg by mouth 2 (two) times daily.   Yes Historical Provider, MD  carvedilol (COREG) 25 MG tablet Take 25 mg by mouth every morning.   Yes Historical Provider, MD  insulin aspart (NOVOLOG) 100 UNIT/ML injection Inject 7 Units into the skin 3 (three) times daily with meals. 10/29/14  Yes Shirlee Latch, MD  insulin glargine (LANTUS) 100 UNIT/ML injection Inject 0.1 mLs (10 Units total) into  the skin 2 (two) times daily. Patient taking differently: Inject 8 Units into the skin 2 (two) times daily.  10/29/14  Yes Shirlee Latch, MD  lanthanum (FOSRENOL) 1000 MG chewable tablet Chew 1,000 mg by mouth as needed (phosphante).   Yes Historical Provider, MD  levothyroxine (SYNTHROID, LEVOTHROID) 100 MCG tablet Take 1 tablet by mouth  daily 09/11/14  Yes Tyrone Nine, MD  lisinopril (PRINIVIL,ZESTRIL) 20 MG tablet Take 20 mg by mouth 2 (two) times daily.   Yes Historical Provider, MD  multivitamin (RENA-VIT) TABS tablet Take 1 tablet by mouth at bedtime. 09/27/14  Yes Ashly M Gottschalk, DO   pantoprazole (PROTONIX) 40 MG tablet Take 1 tablet (40 mg total) by mouth daily. 06/30/14  Yes [place] N Rumley, DO  aspirin 81 MG chewable tablet Chew 1 tablet (81 mg total) by mouth daily. Patient not taking: Reported on 10/24/2014 06/30/14   Lora Havens Rumley, DO  gabapentin (NEURONTIN) 300 MG capsule Take 1 capsule (300 mg total) by mouth daily. Patient not taking: Reported on 10/24/2014 09/27/14   Raliegh Ip, DO   Current Facility-Administered Medications  Medication Dose Route Frequency Provider Last Rate Last Dose  . 0.9 %  sodium chloride infusion  250 mL Intravenous PRN Renee A Kuneff, DO      . acetaminophen (TYLENOL) tablet 650 mg  650 mg Oral Q6H PRN Renee A Kuneff, DO       Or  . acetaminophen (TYLENOL) suppository 650 mg  650 mg Rectal Q6H PRN Renee A Kuneff, DO      . amLODipine (NORVASC) tablet 10 mg  10 mg Oral BID Renee A Kuneff, DO      . aspirin tablet 325 mg  325 mg Oral Daily Renee A Kuneff, DO      . bisacodyl (DULCOLAX) suppository 10 mg  10 mg Rectal Daily PRN Renee A Kuneff, DO      . carvedilol (COREG) tablet 25 mg  25 mg Oral q morning - 10a Renee A Kuneff, DO      . heparin injection 5,000 Units  5,000 Units Subcutaneous 3 times per day Renee A Kuneff, DO      . insulin aspart (novoLOG) injection 0-15 Units  0-15 Units Subcutaneous TID WC Renee A Kuneff, DO      . lanthanum (FOSRENOL) chewable tablet 1,000 mg  1,000 mg Oral PRN Renee A Kuneff, DO      . [START ON 11/13/2014] levothyroxine (SYNTHROID, LEVOTHROID) tablet 100 mcg  100 mcg Oral QAC breakfast Renee A Kuneff, DO      . lisinopril (PRINIVIL,ZESTRIL) tablet 20 mg  20 mg Oral BID Renee A Kuneff, DO      . multivitamin (RENA-VIT) tablet 1 tablet  1 tablet Oral QHS Renee A Kuneff, DO      . ondansetron (ZOFRAN) tablet 4 mg  4 mg Oral Q6H PRN Renee A Kuneff, DO       Or  . ondansetron (ZOFRAN) injection 4 mg  4 mg Intravenous Q6H PRN Renee A Kuneff, DO      . pantoprazole (PROTONIX) EC tablet 40 mg  40 mg  Oral Daily Renee A Kuneff, DO      . senna-docusate (Senokot-S) tablet 1 tablet  1 tablet Oral QHS PRN Renee A Kuneff, DO      . sodium chloride 0.9 % injection 3 mL  3 mL Intravenous Q12H Renee A Kuneff, DO      . sodium chloride 0.9 % injection 3 mL  3 mL Intravenous Q12H Renee A Kuneff, DO      . sodium chloride 0.9 % injection 3 mL  3 mL Intravenous PRN Natalia Leatherwood, DO       Current Outpatient Prescriptions  Medication Sig Dispense Refill  . amLODipine (NORVASC) 10 MG tablet Take 10 mg by mouth 2 (two) times daily.    . carvedilol (COREG) 25 MG tablet Take 25 mg by mouth every morning.    . insulin aspart (NOVOLOG) 100 UNIT/ML injection Inject 7 Units into the skin 3 (three) times daily with meals. 10 mL 11  . insulin glargine (LANTUS) 100 UNIT/ML injection Inject 0.1 mLs (10 Units total) into the skin 2 (two) times daily. (Patient taking differently: Inject 8 Units into the skin 2 (two) times daily. ) 10 mL 11  . lanthanum (FOSRENOL) 1000 MG chewable tablet Chew 1,000 mg by mouth as needed (phosphante).    Marland Kitchen levothyroxine (SYNTHROID, LEVOTHROID) 100 MCG tablet Take 1 tablet by mouth  daily 90 tablet 3  . lisinopril (PRINIVIL,ZESTRIL) 20 MG tablet Take 20 mg by mouth 2 (two) times daily.    . multivitamin (RENA-VIT) TABS tablet Take 1 tablet by mouth at bedtime. 30 tablet 1  . pantoprazole (PROTONIX) 40 MG tablet Take 1 tablet (40 mg total) by mouth daily. 30 tablet 0  . aspirin 81 MG chewable tablet Chew 1 tablet (81 mg total) by mouth daily. (Patient not taking: Reported on 10/24/2014) 30 tablet 0  . gabapentin (NEURONTIN) 300 MG capsule Take 1 capsule (300 mg total) by mouth daily. (Patient not taking: Reported on 10/24/2014) 30 capsule 3   Labs: Basic Metabolic Panel:  Recent Labs Lab 11/12/14 0621  NA 143  K 4.0  CL 100  CO2 31  GLUCOSE 125*  BUN 28*  CREATININE 6.94*  CALCIUM 9.2   Liver Function Tests: No results for input(s): AST, ALT, ALKPHOS, BILITOT, PROT, ALBUMIN  in the last 168 hours. No results for input(s): LIPASE, AMYLASE in the last 168 hours. No results for input(s): AMMONIA in the last 168 hours. CBC:  Recent Labs Lab 11/12/14 0621 11/12/14 1349  WBC 6.7 4.9  NEUTROABS 4.6  --   HGB 13.5 13.1  HCT 40.1 38.2  MCV 93.7 92.3  PLT 186 195   Cardiac Enzymes: No results for input(s): CKTOTAL, CKMB, CKMBINDEX, TROPONINI in the last 168 hours. CBG:  Recent Labs Lab 11/12/14 0602 11/12/14 0724 11/12/14 0933 11/12/14 1135 11/12/14 1415  GLUCAP 125* 134* 200* 245* 302*   Iron Studies: No results for input(s): IRON, TIBC, TRANSFERRIN, FERRITIN in the last 72 hours. Studies/Results: Dg Chest 2 View  11/12/2014   CLINICAL DATA:  Altered mental status.  Hypoglycemia.  EXAM: CHEST  2 VIEW  COMPARISON:  10/24/2014; 06/28/2014; 06/26/2014  FINDINGS: Borderline enlarged cardiac silhouette, possibly accentuated due to decreased lung volumes. Normal mediastinal contours. No focal airspace opacities. No pleural effusion or pneumothorax. No evidence of edema. No acute osseus abnormalities.  IMPRESSION: Borderline cardiomegaly without acute cardiopulmonary disease.   Electronically Signed   By: Simonne Come M.D.   On: 11/12/2014 08:10    Review of Systems: Gen: Feels cold all the time, denies fever or chills HEENT: blind CV: Reports intermittent sternum pain, none currently. Occasional pedal edema improves w HD Resp: Denies dyspnea at rest, dyspnea with exercise, cough, sputum, wheezing, coughing up blood, and pleurisy. GI: Reports ongoing issues with constipation x1 month, no BM in 2-3days.  GU : Denies urinary burning, blood in  urine, urinary frequency, urinary hesitancy, nocturnal urination, and urinary incontinence.  No renal calculi. MS: Denies joint pain, limitation of movement, and swelling, stiffness, low back pain, extremity pain. Denies muscle weakness, cramps, atrophy.  No use of non steroidal antiinflammatory drugs. Derm: Denies rash,  itching, dry skin, hives, moles, warts, or unhealing ulcers.  Psych: Denies depression and suicidal ideation- says this was never a problem Heme: Denies bruising, bleeding, and enlarged lymph nodes. Neuro: No headache.  No diplopia. No dysarthria.  No dysphasia.  No history of CVA.  No Seizures. No paresthesias.  No weakness. Endocrine No DM.  No Thyroid disease.  No Adrenal disease.  Physical Exam: Filed Vitals:   11/12/14 1303 11/12/14 1315 11/12/14 1345 11/12/14 1415  BP: 139/79 147/81 133/71 138/79  Pulse: 77 82 74 81  Temp:      TempSrc:      Resp: 16 15 11 14   Height:      Weight:      SpO2: 100% 100% 100% 100%     General: Well developed, well nourished, in no acute distress. Head: Normocephalic, atraumatic, sclera non-icteric, mucus membranes are moist Neck: Supple. JVD not elevated. Lungs: Clear bilaterally to auscultation without wheezes, rales, or rhonchi. Breathing is unlabored. Heart: RRR with S1 S2. No murmurs, rubs, or gallops appreciated. Abdomen: Soft, mild generalized tenderness, slightly worse RLQ, non-distended with normoactive bowel sounds. No rebound/guarding. No obvious abdominal masses. M-S:  Strength and tone appear normal for age. Lower extremities:without edema or ischemic changes, no open wounds  Neuro: Alert and oriented X 3. Moves all extremities spontaneously. Psych:  Responds to questions appropriately with a normal affect. Aggitated Dialysis Access: R thigh AVG +b/t  Dialysis Orders: TTS [facility] 4 hr  180 2K/2.25ca    60kgs    3000u hep   400/1.5      Aranesp 60 q week    hectorol 3 Q hd  Assessment/Plan: 1.  hypoglycemia- home lantus and aspart. PCP manages DM outpt. Did not eat lunch yesterday/ denies recent hypoglycemia prior to yesterday. Recent hosp with DKA 2. Hypothermia- unknown cause. BC pending/ TSH elevated 3. Chest pain- troponin negative. Cardiology to see/ start heparin gtt 4. Constipation-xray pending. abd US- no findings to explain abd  pain 5.  ESRD -  [facility], full HD yesterday. K+4 6.  Hypertension/volume  - 149/80 on amlodipine, coreg and lisinopril at home- getting to edw outpt. No volume excess 7.  Anemia  -hgb 13.1- Aranesp weekly- no Fe 8.  Metabolic bone disease -  Ca+ 9.2. Last phos 5.8 and PTH 149- cont fosrenol and hectorol 9.  Nutrition - renal diet/ reports good appetite/renal vit 10. Decreased AF- most recent AF 359 down from 415 and 542- likely needs f'gram- can be done outpt 11. Depression- denies any depression, says this was never a problem but has suicidal ideation has been noted during previous admissions/ also has thought about stopping HD recently  Jetty DuhamelBridget Whelan, NP [facility] [phone] 11/12/2014, 2:37 PM  I have seen and examined this patient and agree with plan Lavena BullionBridgett Whelan.  33yo BF with ESRD sec DM admitted for CP and hypoglycemia.  She seems to blame a lot of her issues on dialysis but I think a lot of things hinge on change in her lifestyle as it does not appear to be all that stable. Last HD was yest. Meds reviewed.  Plan next HD tues. Santosh Petter T,MD 11/12/2014 3:25 PM

## 2014-11-12 NOTE — Progress Notes (Signed)
Multiple pages within 10 minutes from floor concerning Ms. Schmutz. It appears Ms. Archie PattenMonroe is acting inappropriately towards staff. Throwing objects at them because she is not getting the insulin regimen she feels she needs. Of note, she was admitted for hypoglycemia of 18 and had to be brought in by EMS. She has type I diabetes that has proven to very brittle. She threatening to leave AMA if she can not speak with a doctor "now".  Upon arrival to floor I could hear Ms. Leventhal raising her voice at the nurse from the hall. Upon entering the room she spoke in a very load voice, at this provider and expressed, in great detail how unhappy she was with her insulin regimen and her care from prior visits. She is unhappy with multiple providers taking care of her and wants to be seen by one provider only. She is requesting a "hospitialist" because her diabetes is "brittle" and when she comes in to the hospital she has to stay, longer than she feels necessary because her "sugars get messed up." She adamantly states it is not her "sugars being crazy that gets her admitted, it because something is wrong that makes her sugars go crazy." She reports she has not had insulin since this afternoon and now her sugars are high. She repeatedly states she will just leave the hospitial and she wants to speak with my "boss."  It was explained to her she is a patient of the family medicine residency program. In being a patient of residency program, she will see multiple levels of providers. All of which make decisions on her care that have been agreed upon by the group. If this is something she does not desire than she will need to consider a private PCP in the outpatient setting. She stated understanding.  Time was given to patient to express all her concerns, despite her yelling them. Her medication regimen was then reviewed with her and reasoning behind lower Lantus dose for this evening was explained. It was explained that her insulin  orders had been in the computer since her admission orders were placed. However, since the lunch dose was given later, this seemed to block nursing from being able to access them. Call to pharmacy was made and pt is getting sliding scale insulin. Her lantus will be due at 10pm. Her ekg results, trop results and heparin drip were explained in great detail to her. She was confused surrounding the details to this and feels that the staff was hiding results from her. She did not understand she was being ruled out for a heart attack. It was made clear to her that she is on a heparin drip, and we would like to continue this until all her troponin are negative. For this reason and her diabetes, it is encouraged she stay. Her KUB was explained to her in detail. She has a moderate stool burden and bowel regimen had been ordered for her, including suppository.  Ultimately pt voiced understanding and apologized to this provider for "getting worked up." I explained to her she does not need to let it get to that point and if she has questions she can ask and I would be happy to explain anything she is uncertain of. She reports she does not take anything for anxiety in the outpatient setting. I offered her anti-anxiety medications if needed while she is admitted, she declined.  Felix PaciniKuneff, Dyan Labarbera DO PGY3 [facility]

## 2014-11-12 NOTE — ED Notes (Addendum)
Pt up to bedside commode to use bathroom. Steady gait. After 1 hour on bair hugger pt rectal temp up to 94.9. CBG 200 at this time

## 2014-11-13 DIAGNOSIS — N186 End stage renal disease: Secondary | ICD-10-CM | POA: Diagnosis not present

## 2014-11-13 DIAGNOSIS — E162 Hypoglycemia, unspecified: Secondary | ICD-10-CM | POA: Diagnosis not present

## 2014-11-13 DIAGNOSIS — T68XXXA Hypothermia, initial encounter: Secondary | ICD-10-CM | POA: Diagnosis not present

## 2014-11-13 DIAGNOSIS — I120 Hypertensive chronic kidney disease with stage 5 chronic kidney disease or end stage renal disease: Secondary | ICD-10-CM | POA: Diagnosis not present

## 2014-11-13 DIAGNOSIS — R1013 Epigastric pain: Secondary | ICD-10-CM | POA: Diagnosis not present

## 2014-11-13 DIAGNOSIS — E10649 Type 1 diabetes mellitus with hypoglycemia without coma: Secondary | ICD-10-CM | POA: Diagnosis not present

## 2014-11-13 DIAGNOSIS — E1022 Type 1 diabetes mellitus with diabetic chronic kidney disease: Secondary | ICD-10-CM | POA: Diagnosis not present

## 2014-11-13 DIAGNOSIS — R404 Transient alteration of awareness: Secondary | ICD-10-CM | POA: Diagnosis not present

## 2014-11-13 DIAGNOSIS — R4182 Altered mental status, unspecified: Secondary | ICD-10-CM | POA: Diagnosis not present

## 2014-11-13 LAB — BASIC METABOLIC PANEL
Anion gap: 14 (ref 5–15)
BUN: 50 mg/dL — ABNORMAL HIGH (ref 6–23)
CALCIUM: 8.3 mg/dL — AB (ref 8.4–10.5)
CO2: 24 mmol/L (ref 19–32)
CREATININE: 9.41 mg/dL — AB (ref 0.50–1.10)
Chloride: 96 mmol/L (ref 96–112)
GFR calc non Af Amer: 5 mL/min — ABNORMAL LOW (ref >90)
GFR, EST AFRICAN AMERICAN: 6 mL/min — AB (ref >90)
GLUCOSE: 374 mg/dL — AB (ref 70–99)
POTASSIUM: 4.1 mmol/L (ref 3.5–5.1)
Sodium: 134 mmol/L — ABNORMAL LOW (ref 135–145)

## 2014-11-13 LAB — CBC WITH DIFFERENTIAL/PLATELET
Basophils Absolute: 0.1 10*3/uL (ref 0.0–0.1)
Basophils Relative: 1 % (ref 0–1)
Eosinophils Absolute: 0 10*3/uL (ref 0.0–0.7)
Eosinophils Relative: 0 % (ref 0–5)
HCT: 31.5 % — ABNORMAL LOW (ref 36.0–46.0)
Hemoglobin: 10.6 g/dL — ABNORMAL LOW (ref 12.0–15.0)
Lymphocytes Relative: 37 % (ref 12–46)
Lymphs Abs: 2.1 10*3/uL (ref 0.7–4.0)
MCH: 31.5 pg (ref 26.0–34.0)
MCHC: 33.7 g/dL (ref 30.0–36.0)
MCV: 93.8 fL (ref 78.0–100.0)
Monocytes Absolute: 0.4 10*3/uL (ref 0.1–1.0)
Monocytes Relative: 8 % (ref 3–12)
Neutro Abs: 3 10*3/uL (ref 1.7–7.7)
Neutrophils Relative %: 54 % (ref 43–77)
Platelets: 171 10*3/uL (ref 150–400)
RBC: 3.36 MIL/uL — ABNORMAL LOW (ref 3.87–5.11)
RDW: 14.3 % (ref 11.5–15.5)
WBC: 5.6 10*3/uL (ref 4.0–10.5)

## 2014-11-13 LAB — HEPARIN LEVEL (UNFRACTIONATED)
Heparin Unfractionated: 0.4 IU/mL (ref 0.30–0.70)
Heparin Unfractionated: <0.1 IU/mL — ABNORMAL LOW (ref 0.30–0.70)

## 2014-11-13 LAB — TROPONIN I

## 2014-11-13 MED ORDER — SODIUM CHLORIDE 0.9 % IV SOLN: 100.0000 mL | INTRAVENOUS | Status: DC | PRN

## 2014-11-13 MED ORDER — LIDOCAINE-PRILOCAINE 2.5-2.5 % EX CREA: 1.0000 "application " | TOPICAL_CREAM | CUTANEOUS | Status: DC | PRN

## 2014-11-13 MED ORDER — HEPARIN SODIUM (PORCINE) 1000 UNIT/ML DIALYSIS
20.0000 [IU]/kg | INTRAMUSCULAR | Status: DC | PRN
  Filled 2014-11-13: qty 2

## 2014-11-13 MED ORDER — LIDOCAINE HCL (PF) 1 % IJ SOLN: 5.0000 mL | INTRAMUSCULAR | Status: DC | PRN

## 2014-11-13 MED ORDER — BISACODYL 10 MG RE SUPP
10.0000 mg | Freq: Once | RECTAL | Status: AC
  Administered 2014-11-13: 10 mg via RECTAL
  Filled 2014-11-13: qty 1

## 2014-11-13 MED ORDER — INSULIN ASPART 100 UNIT/ML ~~LOC~~ SOLN: 7.0000 [IU] | Freq: Three times a day (TID) | SUBCUTANEOUS | Status: DC

## 2014-11-13 MED ORDER — INSULIN ASPART 100 UNIT/ML ~~LOC~~ SOLN
0.0000 [IU] | Freq: Three times a day (TID) | SUBCUTANEOUS | Status: DC
  Administered 2014-11-13: 2 [IU] via SUBCUTANEOUS
  Administered 2014-11-14: 3 [IU] via SUBCUTANEOUS

## 2014-11-13 MED ORDER — PENTAFLUOROPROP-TETRAFLUOROETH EX AERO: 1.0000 "application " | INHALATION_SPRAY | CUTANEOUS | Status: DC | PRN

## 2014-11-13 MED ORDER — INSULIN GLARGINE 100 UNIT/ML ~~LOC~~ SOLN
8.0000 [IU] | Freq: Two times a day (BID) | SUBCUTANEOUS | Status: DC
  Administered 2014-11-13 – 2014-11-14 (×3): 8 [IU] via SUBCUTANEOUS
  Filled 2014-11-13 (×4): qty 0.08

## 2014-11-13 MED ORDER — NEPRO/CARBSTEADY PO LIQD: 237.0000 mL | ORAL | Status: DC | PRN

## 2014-11-13 MED ORDER — INSULIN ASPART 100 UNIT/ML ~~LOC~~ SOLN
10.0000 [IU] | Freq: Once | SUBCUTANEOUS | Status: AC
  Administered 2014-11-13: 10 [IU] via SUBCUTANEOUS

## 2014-11-13 MED ORDER — ALTEPLASE 2 MG IJ SOLR
2.0000 mg | Freq: Once | INTRAMUSCULAR | Status: AC | PRN
  Filled 2014-11-13: qty 2

## 2014-11-13 MED ORDER — HEPARIN SODIUM (PORCINE) 1000 UNIT/ML DIALYSIS
1000.0000 [IU] | INTRAMUSCULAR | Status: DC | PRN
Start: 2014-11-13 — End: 2014-11-14
  Filled 2014-11-13: qty 1

## 2014-11-13 MED ORDER — HEPARIN SODIUM (PORCINE) 5000 UNIT/ML IJ SOLN
5000.0000 [IU] | Freq: Three times a day (TID) | INTRAMUSCULAR | Status: DC
  Administered 2014-11-13 (×2): 5000 [IU] via SUBCUTANEOUS
  Filled 2014-11-13 (×6): qty 1

## 2014-11-13 MED ORDER — INSULIN ASPART 100 UNIT/ML ~~LOC~~ SOLN: 4.0000 [IU] | Freq: Three times a day (TID) | SUBCUTANEOUS | Status: DC | PRN

## 2014-11-13 MED ORDER — INSULIN ASPART 100 UNIT/ML ~~LOC~~ SOLN
4.0000 [IU] | Freq: Three times a day (TID) | SUBCUTANEOUS | Status: DC
  Administered 2014-11-13 – 2014-11-14 (×2): 4 [IU] via SUBCUTANEOUS

## 2014-11-13 MED ORDER — INSULIN ASPART 100 UNIT/ML ~~LOC~~ SOLN: 4.0000 [IU] | Freq: Three times a day (TID) | SUBCUTANEOUS | Status: DC

## 2014-11-13 MED ORDER — INSULIN ASPART 100 UNIT/ML ~~LOC~~ SOLN
0.0000 [IU] | Freq: Three times a day (TID) | SUBCUTANEOUS | Status: DC
  Administered 2014-11-13: 2 [IU] via SUBCUTANEOUS
  Administered 2014-11-13: 3 [IU] via SUBCUTANEOUS

## 2014-11-13 NOTE — Progress Notes (Signed)
UR completed 

## 2014-11-13 NOTE — Progress Notes (Signed)
Family Medicine Teaching Service Daily Progress Note Intern Pager: [phone]  Patient name: [name] Medical record number: [id number] Date of birth: Apr 16, 1981 Age: 34 y.o. Gender: female  Primary Care Provider: Hazeline Junker, MD Consultants: renal Code Status: FULL  Pt Overview and Major Events to Date:    Assessment and Plan: [name] is a 34 y.o. female presenting hypoglycemia. PMH is significant for type 1 diabetes, diabetic retinopathy/blind, frequent DKA, DVT of upper extremity, end-stage renal disease on HD, hypertension.  Hypoglycemia/DM type I: Brittle diabetic history. Reported found lethargic by roommate, EMS called CBG 18, treated in route to ED. ED CBG 125. Pt states she has been compliant with medications and using as directed. Her appetite has been mildly decreased and she is only eating 2 meals a day. 26 units of Novolog/ 24 hours, 4 units Lantus. CBGs 134-338 - SSI, will calculate need over 24 hours to adjust home medications.  - Carb modified diet - hypoglycemia protocol.   Hypothermia: Unknown cause of hypothermia, considering hypothyroidism vs infection. Patient temp on ED arrival 92.3. She was placed on warming blanket, current temperature 98.9. WBC 5.6. - Beir hugger as needed - TSH 5.350 - Cycle trop neg x3 - EKG in ED: ?inferior infarct, prolonged QT>>> start IV heparin drip - Consult cardiology - EKG PRN for chest pain and in the AM.  - Lactic acid normal  ESRD on HD- Receives dialysis on Tuesday, Thursday, Saturday. Last dialysis was yesterday.  - Cr 6.94>9.41, BUN 28>50; Hgb 13.1>10.6 - Electrolytes abnormalities listed above - Nephrology consulted. Appreciate recommendations. - Renal panel after HD tomorrow  Abdominal pain/constipation: CT on last admission positive for fluid overload, and fatty liver. WBC normal. Appears epigastric in nature. WBC within normal limits, troponin negative, white count normal. Lactic acid normal. Patient reports  BM yesterday. - KUB today >> moderate stool burden - Bowel regimen: Senna daily, suppository PRN  # Hypothyroidism: TSH 5.350  - Continue home medication of Levothyroxine, Consider adjusting  # HTN: normotensive - Home medications include Amlodipine , Coreg  BID, and Lisinopril.  - Continue Amlodipine, Coreg, and Lisinopril   # Suicidal Ideation- Noted on a hospitalization prior  - Continue to monitor throughout hospitalization  FEN/GI: Carb Modified diet Prophylaxis: Subcutaneous Heparin  Disposition: Discharge pending control of Blood glucose  Subjective:  Patient reports that she is doing ok today.  She denies vomting/nausea.  Endorses some abdominal discomfort 2/2 needing to pass stool.  Reports 1 BM yesterday after suppository.  Denies dizziness.  Objective: Temp:  [97.4 F (36.3 C)-99.5 F (37.5 C)] 98.8 F (37.1 C) (02/08 0937) Pulse Rate:  [73-88] 78 (02/08 0937) Resp:  [0-23] 18 (02/08 0937) BP: (117-152)/(67-86) 132/72 mmHg (02/08 0937) SpO2:  [99 %-100 %] 99 % (02/08 0937) Weight:  [130 lb 1.1 oz (59 kg)] 130 lb 1.1 oz (59 kg) (02/07 1810) Physical Exam: General: awake, alert, NAD Cardiovascular: RRR, no murmurs Respiratory: CTAB, no increased WOB Abdomen: soft, NT, feels full, +BS Extremities: WWP, no edema Neuro: patient is blind, otherwise no focal deficits  Laboratory:  Recent Labs Lab 11/12/14 0621 11/12/14 1349 11/13/14 0133  WBC 6.7 4.9 5.6  HGB 13.5 13.1 10.6*  HCT 40.1 38.2 31.5*  PLT 186 195 171    Recent Labs Lab 11/12/14 0621 11/12/14 1349 11/12/14 1942 11/13/14 0133  NA 143  --  138 134*  K 4.0  --  3.9 4.1  CL 100  --  99 96  CO2 31  --  24 24  BUN 28*  --  40* 50*  CREATININE 6.94* 7.87* 8.67* 9.41*  CALCIUM 9.2  --  8.5 8.3*  PROT  --   --  7.0  --   BILITOT  --   --  0.9  --   ALKPHOS  --   --  64  --   ALT  --   --  13  --   AST  --   --  15  --   GLUCOSE 125*  --  412* 374*   TSH  5.350 Cardiac Panel (last 3 results)  Recent Labs  11/12/14 1349 11/12/14 1942 11/13/14 0133  TROPONINI <0.03 <0.03 <0.03   EKG 02/08 pending   Imaging/Diagnostic Tests: Dg Chest 2 View  11/12/2014   CLINICAL DATA:  Altered mental status.  Hypoglycemia.  EXAM: CHEST  2 VIEW  COMPARISON:  10/24/2014; 06/28/2014; 06/26/2014  FINDINGS: Borderline enlarged cardiac silhouette, possibly accentuated due to decreased lung volumes. Normal mediastinal contours. No focal airspace opacities. No pleural effusion or pneumothorax. No evidence of edema. No acute osseus abnormalities.  IMPRESSION: Borderline cardiomegaly without acute cardiopulmonary disease.   Electronically Signed   By: Simonne ComeJohn  Watts M.D.   On: 11/12/2014 08:10   Abd 1 View (kub)  11/12/2014   CLINICAL DATA:  Abdominal pain, nausea/ vomiting x3 days, constipation  EXAM: ABDOMEN - 1 VIEW  COMPARISON:  10/29/2014  FINDINGS: Nonobstructive bowel gas pattern.  Mild to moderate colonic stool burden.  Visualized osseous structures are within normal limits.  IMPRESSION: No evidence of bowel obstruction.  Mild to moderate colonic stool burden.   Electronically Signed   By: Charline BillsSriyesh  Krishnan M.D.   On: 11/12/2014 15:41    Raliegh IpAshly M Gottschalk, DO 11/13/2014, 9:55 AM PGY-1, [facility] FPTS Intern pager: [phone], text pages welcome

## 2014-11-13 NOTE — Discharge Summary (Signed)
Family Medicine Teaching [facility] Discharge Summary  Patient name: Anushri Casalino Medical record number: [id number] Date of birth: 1980/11/23 Age: 34 y.o. Gender: female Date of Admission: 11/12/2014  Date of Discharge: 11/14/14 Admitting Physician: Nestor Ramp, MD  Primary Care Provider: Hazeline Junker, MD Consultants: none  Indication for Hospitalization: hypoglycemia, hypothermia  Discharge Diagnoses/Problem List:  Hypoglycemia T1DM ESRD HTN Hypothyroidism Constipation  Disposition: Discharge home   Discharge Condition: Stable  Discharge Exam:  BP 151/79 mmHg  Pulse 72  Temp(Src) 99.2 F (37.3 C) (Oral)  Resp 18  Ht  (1.6 m)  Wt 130 lb 15.3 oz (59.4 kg)  BMI 23.20 kg/m2  SpO2 99%  LMP 10/16/2014  General: awake, alert, getting HD, NAD Cardiovascular: RRR, no murmurs Respiratory: CTAB, no increased WOB Abdomen: soft, NT, feels mildly full, +BS Extremities: WWP, no edema Neuro: patient is blind, otherwise no focal neurological deficits Psych: normal speech, mood stable, affect appropriate  Brief Hospital Course:  Kariana Wiles is a 34 y.o. female that presented with hypothermia and hypoglycemia. PMH is significant for type 1 diabetes, diabetic retinopathy/blind, frequent DKA, DVT of upper extremity, end-stage renal disease on HD, hypertension.  Patient was found down at home and found to have a CBG of 18 by EMS.  Upon arrival to ED it was 125.  In addition, patient was hypothermic to 92.62F.  A warming blanket was placed and her temp came to 97.74F.  Patient became alert and oriented in ED.  Blood cultures were obtained.  She was admitted to the [facility] for further management.  She was placed on sensitive SSI in addition to Lantus per diabetes coordinator recommendations.  Her CBGs were monitored.  Patient was noted to have an EKG concerning for a possible inferior infarct.  For this reason, she was started on a heparin drip, troponins were cycled which were negative.   EKG was repeated the following morning which was normal.  Furthermore, patient had a stress test 6 months prior which was unremarkable.  For this reason, cardiology was not consulted.  TSH was 5.35.    Patient complained of abdominal pain 2/2 constipation.  Abdominal US less than 2 weeks prior revealed no acute processes.  Abdominal xray showed a mild-mod stool burden.  Patient was treated and was able to pass stool, relieving pain.  Consultations to social work and care management were placed in efforts to help patient with medical needs and homelessness situation.  Patient was referred to [facility] for continued medical assistance.  Consult to nephrology was also made in the setting of her dialysis dependent ESRD.  Patient was continued on her home schedule of HD.  She was continued on her home medications for blood pressure and hypothyroidism.  Patient was discharged home in stable condition with close follow up with her endocrinologist and PCP.     Issues for Follow Up:  1. CBGs, medication regimen 2. Repeat TSH.  TSH elevated to 5.350 in hospital.  Did not adjust current Synthroid dose. 3. Please address BP medications.  Lisinopril scheduled BID and Coreg QD.  This is not the typical dosing for these medications.  In the setting of generally, well controlled BP <140/90, this was not adjusted.  Significant Procedures: none  Significant Labs and Imaging:   Recent Labs Lab 11/12/14 1349 11/13/14 0133 11/14/14 0630  WBC 4.9 5.6 4.5  HGB 13.1 10.6* 10.4*  HCT 38.2 31.5* 29.5*  PLT 195 171 161    Recent Labs Lab 11/12/14 0621 11/12/14 1349  11/12/14 1942 11/13/14 0133 11/14/14 0630  NA 143  --  138 134* 133*  K 4.0  --  3.9 4.1 3.5  CL 100  --  99 96 95*  CO2 31  --  GLUCOSE 125*  --  412* 374* 204*  BUN 28*  --  40* 50* 75*  CREATININE 6.94* 7.87* 8.67* 9.41* 11.90*  CALCIUM 9.2  --  8.5 8.3* 8.3*  MG  --   --  2.3  --   --   PHOS  --   --  5.7*  --  7.9*   ALKPHOS  --   --  64  --   --   AST  --   --  15  --   --   ALT  --   --  13  --   --   ALBUMIN  --   --  3.5  --  3.1*   Dg Chest 2 View  11/12/2014   CLINICAL DATA:  Altered mental status.  Hypoglycemia.  EXAM: CHEST  2 VIEW  COMPARISON:  10/24/2014; 06/28/2014; 06/26/2014  FINDINGS: Borderline enlarged cardiac silhouette, possibly accentuated due to decreased lung volumes. Normal mediastinal contours. No focal airspace opacities. No pleural effusion or pneumothorax. No evidence of edema. No acute osseus abnormalities.  IMPRESSION: Borderline cardiomegaly without acute cardiopulmonary disease.   Electronically Signed   By: Simonne Come M.D.   On: 11/12/2014 08:10   Abd 1 View (kub)  11/12/2014   CLINICAL DATA:  Abdominal pain, nausea/ vomiting x3 days, constipation  EXAM: ABDOMEN - 1 VIEW  COMPARISON:  10/29/2014  FINDINGS: Nonobstructive bowel gas pattern.  Mild to moderate colonic stool burden.  Visualized osseous structures are within normal limits.  IMPRESSION: No evidence of bowel obstruction.  Mild to moderate colonic stool burden.   Electronically Signed   By: Charline Bills M.D.   On: 11/12/2014 15:41    Results/Tests Pending at Time of Discharge: Final read on blood cultures (presently NGTD)  Discharge Medications:    Medication List    TAKE these medications        amLODipine 10 MG tablet  Commonly known as:  NORVASC  Take 10 mg by mouth 2 (two) times daily.     aspirin 81 MG chewable tablet  Chew 1 tablet (81 mg total) by mouth daily.     carvedilol 25 MG tablet  Commonly known as:  COREG  Take 25 mg by mouth every morning.     gabapentin 300 MG capsule  Commonly known as:  NEURONTIN  Take 1 capsule (300 mg total) by mouth daily.     insulin aspart 100 UNIT/ML injection  Commonly known as:  novoLOG  Inject 4 Units into the skin 3 (three) times daily with meals.     insulin glargine 100 UNIT/ML injection  Commonly known as:  LANTUS  Inject 0.08 mLs (8 Units  total) into the skin 2 (two) times daily.     lanthanum 1000 MG chewable tablet  Commonly known as:  FOSRENOL  Chew 1,000 mg by mouth as needed (phosphante).     levothyroxine 100 MCG tablet  Commonly known as:  SYNTHROID, LEVOTHROID  Take 1 tablet by mouth  daily     multivitamin Tabs tablet  Take 1 tablet by mouth at bedtime.     pantoprazole 40 MG tablet  Commonly known as:  PROTONIX  Take 1 tablet (40 mg total) by mouth daily.  Discharge Instructions: Please refer to Patient Instructions section of EMR for full details.  Patient was counseled important signs and symptoms that should prompt return to medical care, changes in medications, dietary instructions, activity restrictions, and follow up appointments.   Follow-Up Appointments: Follow-up Information    Follow up with Hazeline JunkerGrunz, Ryan, MD. Go on 12/01/2014.   Specialty:  Family Medicine   Why:  9:30 am (hospital follow up)   Contact information:   [address] [place] [address] [phone]       [facility]. Go on 11/16/2014.   Specialty:  Endocrinology   Why:  8:00am (hospital follow up)   Contact information:   [address] [place] [address] [phone]       Raliegh Ipshly M Sabeen Piechocki, DO 11/14/2014, 11:57 AM PGY-1, [place] [facility]

## 2014-11-13 NOTE — Progress Notes (Signed)
**  Interval Note**  Discussed insulin regimen with patient's RN.  She asked that I change the Novolog to scheduled with admin orders to give IF patient takes >50% of meal.  Initially, this was placed as a PRN order.  For RN's convenience this was adjusted.  However, I reiterated that this NOT be given scheduled but as a PRN order for aforementioned reasons.  RN voices good understanding.  Wendell Fiebig M. Nadine CountsGottschalk, DO PGY-1, [facility]

## 2014-11-13 NOTE — Consult Note (Signed)
Referral received from inpatient RNCM per MD.  Sherron MondaySpoke with the patient about the [facility] Care [facility] program to offer support in the community for self management.  Patient states she is a Type I diabetic and feels that she has a good handle on managing her diabetes from an educational standpoint.  She states she speaks to a dietician frequently each week, if needed, when she goes to dialysis (Tuesday, Thursday, and Saturday).  She states she would consider [facility] for a follow up after her hospitalization for calls but she is not interested in home visits.  [facility] Hospital Liaison will continue to follow.  She confirms that [place] at [address], [place], [place] [address] her best contact phone number is [phone]. Of note, [facility] Care Management does not interfere with or replace any services needed for this patient.  For questions, please call Charlesetta ShanksVictoria Emoree Sasaki, RN, BSN, [facility] Hospital Liaison Nurse at [phone].

## 2014-11-13 NOTE — Progress Notes (Signed)
[facility] Progress Note  Assessment/Plan: 1. Hypoglycemia- home lantus and aspart. PCP manages DM outpt. Did not eat lunch yesterday/ denies recent hypoglycemia prior to admission event. Had recent hosp with DKA 2. Hypothermia- unknown cause. BC pending/ TSH 5.35 3. Chest pain- all troponins negative -  4. Constipation/abdominal pain- . abd US- no findings to explain abd pain - mild-mod stool on films; doesn't think miralax works, drinks senna tea at home, worried that sorbitol would increase BS so agrees to try suppository again today 5. ESRD - TTS NW,  6. Hypertension/volume - BP ok on amlodipine, coreg and lisinopril at home- getting to edw outpt. No volume excess 7. Anemia -hgb 13s x 2 to 10.6 today - Aranesp weekly on Tuesday - follow CBC no Fe 8. Metabolic bone disease - PTH 149- cont fosrenol and hectorol 9. Nutrition - renal diet/ reports good appetite/renal vit 10. Decreased AF- most recent AF 359 down from 415 and 542-  [facility] - can change to shuntagram at discharge 11. Depression- denies any depression, says this was never a problem but has suicidal ideation has been noted during previous admissions/ also has thought about stopping HD recently; seems some denial going on - [facility] following one of previous admissions.  Sheffield SliderMartha B Bergman, PA-C [facility] Beeper [phone] 11/13/2014,8:58 AM  LOS: 1 day   Pt seen, examined and agree w A/P as above.  Vinson Moselleob Saphira Lahmann MD pager [phone]    cell [phone] 11/13/2014, 12:05 PM     Subjective:   Still trying to figure out why BS dropped on Saturday; ate 100% breakfast , some pain with defecation  Objective Filed Vitals:   11/12/14 1730 11/12/14 1810 11/12/14 2003 11/13/14 0416  BP: 140/73  152/78 138/80  Pulse: 79  83 77  Temp:   99.5 F (37.5 C) 98.9 F (37.2 C)  TempSrc:      Resp: 14  18 18   Height:  5\' 3"  (1.6 m)     Weight:  59 kg (130 lb 1.1 oz)    SpO2: 99%  100% 99%   Physical Exam General: calm NAD Heart: RRR Lungs:  No rales Abdomen: soft mild tenderness, left side Extremities: no LE edema Dialysis Access: right thigh AVGG + bruit  Dialysis Orders: TTS NW 4 hr 180 2K/2.25ca 60kgs 3000u hep 400/1.5  Aranesp 60 q week hectorol 3 Q hd  Additional Objective Labs: Basic Metabolic Panel:  Recent Labs Lab 11/12/14 0621 11/12/14 1349 11/12/14 1942 11/13/14 0133  NA 143  --  138 134*  K 4.0  --  3.9 4.1  CL 100  --  99 96  CO2 31  --  24 24  GLUCOSE 125*  --  412* 374*  BUN 28*  --  40* 50*  CREATININE 6.94* 7.87* 8.67* 9.41*  CALCIUM 9.2  --  8.5 8.3*  PHOS  --   --  5.7*  --    Liver Function Tests:  Recent Labs Lab 11/12/14 1942  AST 15  ALT 13  ALKPHOS 64  BILITOT 0.9  PROT 7.0  ALBUMIN 3.5   CBC:  Recent Labs Lab 11/12/14 0621 11/12/14 1349 11/13/14 0133  WBC 6.7 4.9 5.6  NEUTROABS 4.6  --  3.0  HGB 13.5 13.1 10.6*  HCT 40.1 38.2 31.5*  MCV 93.7 92.3 93.8  PLT 186 195 171  Cardiac Enzymes:  Recent Labs Lab 11/12/14 1349 11/12/14 1942 11/13/14  0133  TROPONINI <0.03 <0.03 <0.03   CBG:  Recent Labs Lab 11/12/14 0933 11/12/14 1135 11/12/14 1415 11/12/14 1620 11/12/14 2001  GLUCAP 200* 245* 302* 239* 359*  Studies/Results: Dg Chest 2 View  11/12/2014   CLINICAL DATA:  Altered mental status.  Hypoglycemia.  EXAM: CHEST  2 VIEW  COMPARISON:  10/24/2014; 06/28/2014; 06/26/2014  FINDINGS: Borderline enlarged cardiac silhouette, possibly accentuated due to decreased lung volumes. Normal mediastinal contours. No focal airspace opacities. No pleural effusion or pneumothorax. No evidence of edema. No acute osseus abnormalities.  IMPRESSION: Borderline cardiomegaly without acute cardiopulmonary disease.   Electronically Signed   By: Simonne Come M.D.   On: 11/12/2014 08:10   Abd 1 View (kub)  11/12/2014   CLINICAL DATA:  Abdominal pain,  nausea/ vomiting x3 days, constipation  EXAM: ABDOMEN - 1 VIEW  COMPARISON:  10/29/2014  FINDINGS: Nonobstructive bowel gas pattern.  Mild to moderate colonic stool burden.  Visualized osseous structures are within normal limits.  IMPRESSION: No evidence of bowel obstruction.  Mild to moderate colonic stool burden.   Electronically Signed   By: Charline Bills M.D.   On: 11/12/2014 15:41   Medications:   . amLODipine  10 mg Oral BID  . aspirin  325 mg Oral Daily  . carvedilol  25 mg Oral q morning - 10a  . [START ON 11/14/2014] darbepoetin (ARANESP) injection - DIALYSIS  60 mcg Intravenous Q Tue-HD  . [START ON 11/14/2014] doxercalciferol  3 mcg Intravenous Q T,Th,Sa-HD  . heparin subcutaneous  5,000 Units Subcutaneous 3 times per day  . insulin aspart  0-15 Units Subcutaneous TID WC  . insulin glargine  8 Units Subcutaneous BID  . lanthanum  500 mg Oral TID WC  . levothyroxine  100 mcg Oral QAC breakfast  . lisinopril  20 mg Oral BID  . multivitamin  1 tablet Oral QHS  . pantoprazole  40 mg Oral Daily  . sodium chloride  3 mL Intravenous Q12H  . sodium chloride  3 mL Intravenous Q12H

## 2014-11-13 NOTE — Progress Notes (Signed)
Inpatient Diabetes Program Recommendations  AACE/ADA: New Consensus Statement on Inpatient Glycemic Control (2013)  Target Ranges:  Prepandial:   less than 140 mg/dL      Peak postprandial:   less than 180 mg/dL (1-2 hours)      Critically ill patients:  140 - 180 mg/dL   Reason for Visit: Diabetes Consult Diabetes history: Type 1 DM Outpatient Diabetes medications: Lantus 8 units bid, Novolog moderate tidwc Current orders for Inpatient glycemic control:   34 y.o. female presenting hypoglycemia. PMH is significant for type 1 diabetes, diabetic retinopathy/blind, frequent DKA, DVT of upper extremity, end-stage renal disease on HD, hypertension. Long discussion with pt regarding diabetes control. States [name]. Checks blood sugars multiple times/day. Discussed always having glucose source with her if blood sugars begin to drop. Requests meal coverage insulin and agrees with 4 units tidwc.  Results for Susan RossettiMONROE, Jodell (MRN [id number]) as of 11/13/2014 14:42  Ref. Range 11/12/2014 20:01  Glucose-Capillary Latest Range: 70-99 mg/dL 409359 (H)    Recommendations: Add Novolog 4 units tidwc for meal coverage insulin if pt eats >50% meal. Decrease Novolog correction to sensitive tidwc.  Discussed above with RN and MD.  Will follow daily. Thank you. Ailene Ardshonda Oisin Yoakum, RD, LDN, CDE Inpatient Diabetes Coordinator [phone] you. Ailene Ardshonda Domanick Cuccia, RD, LDN, CDE Inpatient Diabetes Coordinator [phone]

## 2014-11-14 DIAGNOSIS — N186 End stage renal disease: Secondary | ICD-10-CM

## 2014-11-14 DIAGNOSIS — E162 Hypoglycemia, unspecified: Secondary | ICD-10-CM | POA: Diagnosis not present

## 2014-11-14 DIAGNOSIS — T68XXXA Hypothermia, initial encounter: Secondary | ICD-10-CM | POA: Diagnosis not present

## 2014-11-14 DIAGNOSIS — Z992 Dependence on renal dialysis: Secondary | ICD-10-CM

## 2014-11-14 DIAGNOSIS — E1022 Type 1 diabetes mellitus with diabetic chronic kidney disease: Secondary | ICD-10-CM | POA: Diagnosis not present

## 2014-11-14 DIAGNOSIS — N189 Chronic kidney disease, unspecified: Secondary | ICD-10-CM

## 2014-11-14 DIAGNOSIS — E1139 Type 2 diabetes mellitus with other diabetic ophthalmic complication: Secondary | ICD-10-CM | POA: Insufficient documentation

## 2014-11-14 DIAGNOSIS — H548 Legal blindness, as defined in USA: Secondary | ICD-10-CM

## 2014-11-14 DIAGNOSIS — R4182 Altered mental status, unspecified: Secondary | ICD-10-CM | POA: Diagnosis not present

## 2014-11-14 LAB — RENAL FUNCTION PANEL
Albumin: 3.1 g/dL — ABNORMAL LOW (ref 3.5–5.2)
Anion gap: 16 — ABNORMAL HIGH (ref 5–15)
BUN: 75 mg/dL — ABNORMAL HIGH (ref 6–23)
CO2: 22 mmol/L (ref 19–32)
Calcium: 8.3 mg/dL — ABNORMAL LOW (ref 8.4–10.5)
Chloride: 95 mmol/L — ABNORMAL LOW (ref 96–112)
Creatinine, Ser: 11.9 mg/dL — ABNORMAL HIGH (ref 0.50–1.10)
GFR calc Af Amer: 4 mL/min — ABNORMAL LOW (ref >90)
GFR calc non Af Amer: 4 mL/min — ABNORMAL LOW (ref >90)
Glucose, Bld: 204 mg/dL — ABNORMAL HIGH (ref 70–99)
Phosphorus: 7.9 mg/dL — ABNORMAL HIGH (ref 2.3–4.6)
Potassium: 3.5 mmol/L (ref 3.5–5.1)
Sodium: 133 mmol/L — ABNORMAL LOW (ref 135–145)

## 2014-11-14 LAB — CBC
HCT: 29.5 % — ABNORMAL LOW (ref 36.0–46.0)
Hemoglobin: 10.4 g/dL — ABNORMAL LOW (ref 12.0–15.0)
MCH: 31.5 pg (ref 26.0–34.0)
MCHC: 35.3 g/dL (ref 30.0–36.0)
MCV: 89.4 fL (ref 78.0–100.0)
Platelets: 161 10*3/uL (ref 150–400)
RBC: 3.3 MIL/uL — ABNORMAL LOW (ref 3.87–5.11)
RDW: 13.7 % (ref 11.5–15.5)
WBC: 4.5 10*3/uL (ref 4.0–10.5)

## 2014-11-14 LAB — GLUCOSE, CAPILLARY
GLUCOSE-CAPILLARY: 338 mg/dL — AB (ref 70–99)
GLUCOSE-CAPILLARY: 175 mg/dL — AB (ref 70–99)
Glucose-Capillary: 139 mg/dL — ABNORMAL HIGH (ref 70–99)
Glucose-Capillary: 220 mg/dL — ABNORMAL HIGH (ref 70–99)
Glucose-Capillary: 174 mg/dL — ABNORMAL HIGH (ref 70–99)
Glucose-Capillary: 275 mg/dL — ABNORMAL HIGH (ref 70–99)
Glucose-Capillary: 189 mg/dL — ABNORMAL HIGH (ref 70–99)

## 2014-11-14 MED ORDER — DARBEPOETIN ALFA 60 MCG/0.3ML IJ SOSY
PREFILLED_SYRINGE | INTRAMUSCULAR | Status: AC
  Filled 2014-11-14: qty 0.3

## 2014-11-14 MED ORDER — INSULIN ASPART 100 UNIT/ML ~~LOC~~ SOLN: 4.0000 [IU] | Freq: Three times a day (TID) | SUBCUTANEOUS | Status: DC

## 2014-11-14 MED ORDER — DOXERCALCIFEROL 4 MCG/2ML IV SOLN
INTRAVENOUS | Status: AC
  Filled 2014-11-14: qty 2

## 2014-11-14 MED ORDER — INSULIN GLARGINE 100 UNIT/ML ~~LOC~~ SOLN: 8.0000 [IU] | Freq: Two times a day (BID) | SUBCUTANEOUS | Status: AC

## 2014-11-14 MED ORDER — BISACODYL 10 MG RE SUPP: 10.0000 mg | Freq: Once | RECTAL | Status: DC

## 2014-11-14 NOTE — Progress Notes (Signed)
[facility] Progress Note  Assessment/Plan: 1. Hypoglycemia- home lantus and aspart. PCP manages DM outpt.  Hypoglycemia prior to admission event. Had recent hosp with DKA. BP up now 2. Hypothermia- unknown cause. BC pending/ TSH 5.35 3. Chest pain- all troponins negative -  4. Constipation/abdominal pain- . abd US- no findings to explain abd pain - mild-mod stool on films; doesn't think miralax works, drinks senna tea at home, worried that sorbitol would increase BS so tried another dulcolax suppository. Still feels like stool gets stuck. Belly softer on exam.  5. ESRD - TTS NW, K 3.5 - changed to 4 K bath 6. Hypertension/volume - BP ok on amlodipine, coreg and lisinopril at home- getting to edw outpt. Goal 2.5 7. Anemia -hgb 13s x 2 to 10.6 to 10.4 2/9 - redose- Aranesp  Tuesday -  8. Metabolic bone disease - PTH 149- cont fosrenol and hectorol, not sure why P up to 7.9 9. Nutrition - renal diet/ reports good appetite/renal vit 10. Decreased AF- most recent AF 359 down from 415 and 542- [facility] - can change to shuntagram at discharge 11. Depression- denies any depression, says this was never a problem but has suicidal ideation has been noted during previous admissions/ also has thought about stopping HD recently; seems some denial going on - [facility] following one of previous admissions.  Sheffield SliderMartha B Bergman, PA-C [facility] Beeper [phone] 11/14/2014,8:24 AM  LOS: 2 days   Pt seen, examined and agree w A/P as above.  Vinson Moselleob Pheng Prokop MD pager [phone]    cell [phone] 11/14/2014, 1:12 PM    Subjective:     Objective Filed Vitals:   11/14/14 0650 11/14/14 0656 11/14/14 0701 11/14/14 0730  BP: 145/75 132/78 134/73 143/76  Pulse: 71 69 69 69  Temp: 98.6 F (37 C)     TempSrc: Oral     Resp: 18     Height:      Weight: 61.4 kg (135 lb 5.8 oz)     SpO2: 99%       Physical Exam General: on HD , sleeping, rouses, less engaging Heart: RRR Lungs:no rales Abdomen: soft NT + BS Extremities: no LE edema Dialysis Access: right thigh AVGG Qb 400  Dialysis Orders: TTS NW 4 hr 180 2K/2.25ca 60kgs 3000u hep 400/1.5  Aranesp 60 q week hectorol 3 Q hd  Additional Objective Labs: Basic Metabolic Panel:  Recent Labs Lab 11/12/14 1942 11/13/14 0133 11/14/14 0630  NA 138 134* 133*  K 3.9 4.1 3.5  CL 99 96 95*  CO2 24 24 22   GLUCOSE 412* 374* 204*  BUN 40* 50* 75*  CREATININE 8.67* 9.41* 11.90*  CALCIUM 8.5 8.3* 8.3*  PHOS 5.7*  --  7.9*   Liver Function Tests:  Recent Labs Lab 11/12/14 1942 11/14/14 0630  AST 15  --   ALT 13  --   ALKPHOS 64  --   BILITOT 0.9  --   PROT 7.0  --   ALBUMIN 3.5 3.1*  CBC:  Recent Labs Lab 11/12/14 0621 11/12/14 1349 11/13/14 0133 11/14/14 0630  WBC 6.7 4.9 5.6 4.5  NEUTROABS 4.6  --  3.0  --   HGB 13.5 13.1 10.6* 10.4*  HCT 40.1 38.2 31.5* 29.5*  MCV 93.7 92.3 93.8 89.4  PLT 186 195 171 161  Cardiac Enzymes:  Recent Labs Lab 11/12/14 1349 11/12/14 1942 11/13/14 0133  TROPONINI <0.03 <0.03 <0.03  CBG:  Recent Labs Lab 11/12/14 0933 11/12/14 1135 11/12/14 1415 11/12/14 1620 11/12/14 2001  GLUCAP 200* 245* 302* 239* 359*   Studies/Results: Abd 1 View (kub)  11/12/2014   CLINICAL DATA:  Abdominal pain, nausea/ vomiting x3 days, constipation  EXAM: ABDOMEN - 1 VIEW  COMPARISON:  10/29/2014  FINDINGS: Nonobstructive bowel gas pattern.  Mild to moderate colonic stool burden.  Visualized osseous structures are within normal limits.  IMPRESSION: No evidence of bowel obstruction.  Mild to moderate colonic stool burden.   Electronically Signed   By: Charline Bills M.D.   On: 11/12/2014 15:41   Medications:   . amLODipine  10 mg Oral BID  . aspirin  325 mg Oral Daily  . carvedilol  25 mg Oral q morning - 10a  . Darbepoetin Alfa      . darbepoetin (ARANESP)  injection - DIALYSIS  60 mcg Intravenous Q Tue-HD  . doxercalciferol      . doxercalciferol  3 mcg Intravenous Q T,Th,Sa-HD  . heparin subcutaneous  5,000 Units Subcutaneous 3 times per day  . insulin aspart  0-9 Units Subcutaneous TID WC  . insulin aspart  4 Units Subcutaneous TID WC  . insulin glargine  8 Units Subcutaneous BID  . lanthanum  500 mg Oral TID WC  . levothyroxine  100 mcg Oral QAC breakfast  . lisinopril  20 mg Oral BID  . multivitamin  1 tablet Oral QHS  . pantoprazole  40 mg Oral Daily  . sodium chloride  3 mL Intravenous Q12H  . sodium chloride  3 mL Intravenous Q12H

## 2014-11-14 NOTE — Progress Notes (Signed)
Pt provided with discharge information including instruction on follow up appointments, medications, activity level, and diet. Pt verbalized understanding of all information. IV was dc'd. VSS. Pt escorted out via wheelchair by nursing student.

## 2014-11-14 NOTE — Progress Notes (Signed)
Follow up on referral:  Met with the patient at bedside to offer [place] Management services. Patient agreeable to services and will receive post hospital discharge call and will be evaluated for monthly home visits. Consent form read to the patient and signed and a copy with the folder with [place] Management information given. Of note, [facility] Management services does not replace or interfere with any services that are arranged by inpatient case management or social work. For additional questions or referrals please contact, Natividad Brood, RN BSN CCM, [facility] Liaison at [phone].

## 2014-11-14 NOTE — Discharge Instructions (Signed)
Please make sure to follow up with Dr Lucianne MussKumar as scheduled on Thursday.  He will need to address your current insulin regimen as well as your thyroid at that appointment.  Also, you have a hospital follow up with you PCP.   Please take your insulin as directed.  Please also have glucose tablets or a sugar source on hand for low blood sugar.   Low Blood Sugar Low blood sugar (hypoglycemia) means that the level of sugar in your blood is lower than it should be. Signs of low blood sugar include:  Getting sweaty.  Feeling hungry.  Feeling dizzy or weak.  Feeling sleepier than normal.  Feeling nervous.  Headaches.  Having a fast heartbeat. Low blood sugar can happen fast and can be an emergency. Your doctor can do tests to check your blood sugar level. You can have low blood sugar and not have diabetes. HOME CARE  Check your blood sugar as told by your doctor. If it is less than 70 mg/dl or as told by your doctor, take 1 of the following:  3 to 4 glucose tablets.   cup clear juice.   cup soda pop, not diet.  1 cup milk.  5 to 6 hard candies.  Recheck blood sugar after 15 minutes. Repeat until it is at the right level.  Eat a snack if it is more than 1 hour until the next meal.  Only take medicine as told by your doctor.  Do not skip meals. Eat on time.  Do not drink alcohol except with meals.  Check your blood glucose before driving.  Check your blood glucose before and after exercise.  Always carry treatment with you, such as glucose pills.  Always wear a medical alert bracelet if you have diabetes. GET HELP RIGHT AWAY IF:   Your blood glucose goes below 70 mg/dl or as told by your doctor, and you:  Are confused.  Are not able to swallow.  Pass out (faint).  You cannot treat yourself. You may need someone to help you.  You have low blood sugar problems often.  You have problems from your medicines.  You are not feeling better after 3 to 4 days.  You  have vision changes. MAKE SURE YOU:   Understand these instructions.  Will watch this condition.  Will get help right away if you are not doing well or get worse. Document Released: 12/17/2009 Document Revised: 12/15/2011 Document Reviewed: 12/17/2009 [facility] Patient Information 2015 [employer] [place]. This information is not intended to replace advice given to you by your health care provider. Make sure you discuss any questions you have with your health care provider.

## 2014-11-15 NOTE — Care Management Note (Signed)
Late Entry;  CARE MANAGEMENT NOTE 11/15/2014  Patient:  [name],[name]   Account Number:  [id number]  Date Initiated:  11/15/2014  Documentation initiated by:  Nels Munn  Subjective/Objective Assessment:   CM followed for progression and d/c planning.     Action/Plan:   Pt lives with friend, [facility] with [facility] however [facility] was able to pick this pt up and will follow for ongoing support and monitoring.   Anticipated DC Date:  11/14/2014   Anticipated DC Plan:  HOME/SELF CARE         Choice offered to / List presented to:             Status of service:  Completed, signed off Medicare Important Message given?  NO (If response is "NO", the following Medicare IM given date fields will be blank) Date Medicare IM given:   Medicare IM given by:   Date Additional Medicare IM given:   Additional Medicare IM given by:    Discharge Disposition:  HOME/SELF CARE  Per UR Regulation:    If discussed at Long Length of Stay Meetings, dates discussed:    Comments:

## 2014-11-16 ENCOUNTER — Ambulatory Visit: Payer: Medicare Other | Admitting: Endocrinology

## 2014-11-16 ENCOUNTER — Encounter (HOSPITAL_COMMUNITY): Payer: Self-pay | Admitting: *Deleted

## 2014-11-16 ENCOUNTER — Inpatient Hospital Stay (HOSPITAL_COMMUNITY)
Admission: EM | Admit: 2014-11-16 | Discharge: 2014-11-23 | DRG: 638 | Disposition: A | Discharge status: Home / Self Care | Payer: 59 | Attending: Family Medicine | Admitting: Family Medicine

## 2014-11-16 ENCOUNTER — Emergency Department (HOSPITAL_COMMUNITY): Payer: 59

## 2014-11-16 DIAGNOSIS — Z5900 Homelessness unspecified: Secondary | ICD-10-CM | POA: Insufficient documentation

## 2014-11-16 DIAGNOSIS — E875 Hyperkalemia: Secondary | ICD-10-CM | POA: Diagnosis not present

## 2014-11-16 DIAGNOSIS — R0600 Dyspnea, unspecified: Secondary | ICD-10-CM | POA: Diagnosis not present

## 2014-11-16 DIAGNOSIS — Z79899 Other long term (current) drug therapy: Secondary | ICD-10-CM

## 2014-11-16 DIAGNOSIS — E11319 Type 2 diabetes mellitus with unspecified diabetic retinopathy without macular edema: Secondary | ICD-10-CM | POA: Insufficient documentation

## 2014-11-16 DIAGNOSIS — E162 Hypoglycemia, unspecified: Secondary | ICD-10-CM

## 2014-11-16 DIAGNOSIS — K5900 Constipation, unspecified: Secondary | ICD-10-CM | POA: Diagnosis present

## 2014-11-16 DIAGNOSIS — N2581 Secondary hyperparathyroidism of renal origin: Secondary | ICD-10-CM | POA: Diagnosis present

## 2014-11-16 DIAGNOSIS — E10319 Type 1 diabetes mellitus with unspecified diabetic retinopathy without macular edema: Secondary | ICD-10-CM | POA: Diagnosis present

## 2014-11-16 DIAGNOSIS — N186 End stage renal disease: Secondary | ICD-10-CM | POA: Diagnosis present

## 2014-11-16 DIAGNOSIS — Z992 Dependence on renal dialysis: Secondary | ICD-10-CM | POA: Diagnosis not present

## 2014-11-16 DIAGNOSIS — H540 Blindness, both eyes: Secondary | ICD-10-CM | POA: Diagnosis present

## 2014-11-16 DIAGNOSIS — Z9889 Other specified postprocedural states: Secondary | ICD-10-CM

## 2014-11-16 DIAGNOSIS — Z794 Long term (current) use of insulin: Secondary | ICD-10-CM

## 2014-11-16 DIAGNOSIS — R0789 Other chest pain: Secondary | ICD-10-CM | POA: Diagnosis not present

## 2014-11-16 DIAGNOSIS — N189 Chronic kidney disease, unspecified: Secondary | ICD-10-CM

## 2014-11-16 DIAGNOSIS — E1065 Type 1 diabetes mellitus with hyperglycemia: Secondary | ICD-10-CM | POA: Diagnosis not present

## 2014-11-16 DIAGNOSIS — E1039 Type 1 diabetes mellitus with other diabetic ophthalmic complication: Secondary | ICD-10-CM | POA: Insufficient documentation

## 2014-11-16 DIAGNOSIS — T68XXXA Hypothermia, initial encounter: Secondary | ICD-10-CM | POA: Diagnosis not present

## 2014-11-16 DIAGNOSIS — R451 Restlessness and agitation: Secondary | ICD-10-CM | POA: Diagnosis not present

## 2014-11-16 DIAGNOSIS — Z7982 Long term (current) use of aspirin: Secondary | ICD-10-CM | POA: Diagnosis not present

## 2014-11-16 DIAGNOSIS — T8241XA Breakdown (mechanical) of vascular dialysis catheter, initial encounter: Secondary | ICD-10-CM

## 2014-11-16 DIAGNOSIS — E1042 Type 1 diabetes mellitus with diabetic polyneuropathy: Secondary | ICD-10-CM | POA: Diagnosis present

## 2014-11-16 DIAGNOSIS — Z86718 Personal history of other venous thrombosis and embolism: Secondary | ICD-10-CM | POA: Diagnosis not present

## 2014-11-16 DIAGNOSIS — F332 Major depressive disorder, recurrent severe without psychotic features: Secondary | ICD-10-CM | POA: Diagnosis present

## 2014-11-16 DIAGNOSIS — I120 Hypertensive chronic kidney disease with stage 5 chronic kidney disease or end stage renal disease: Secondary | ICD-10-CM | POA: Diagnosis present

## 2014-11-16 DIAGNOSIS — E1022 Type 1 diabetes mellitus with diabetic chronic kidney disease: Secondary | ICD-10-CM | POA: Insufficient documentation

## 2014-11-16 DIAGNOSIS — E890 Postprocedural hypothyroidism: Secondary | ICD-10-CM | POA: Diagnosis present

## 2014-11-16 DIAGNOSIS — D649 Anemia, unspecified: Secondary | ICD-10-CM | POA: Diagnosis not present

## 2014-11-16 DIAGNOSIS — R011 Cardiac murmur, unspecified: Secondary | ICD-10-CM | POA: Diagnosis present

## 2014-11-16 DIAGNOSIS — E10649 Type 1 diabetes mellitus with hypoglycemia without coma: Principal | ICD-10-CM | POA: Diagnosis present

## 2014-11-16 DIAGNOSIS — R680 Hypothermia, not associated with low environmental temperature: Secondary | ICD-10-CM | POA: Diagnosis present

## 2014-11-16 DIAGNOSIS — H547 Unspecified visual loss: Secondary | ICD-10-CM

## 2014-11-16 DIAGNOSIS — Z9114 Patient's other noncompliance with medication regimen: Secondary | ICD-10-CM | POA: Diagnosis present

## 2014-11-16 DIAGNOSIS — T68XXXD Hypothermia, subsequent encounter: Secondary | ICD-10-CM | POA: Diagnosis not present

## 2014-11-16 LAB — BASIC METABOLIC PANEL
Anion gap: 15 (ref 5–15)
BUN: 65 mg/dL — ABNORMAL HIGH (ref 6–23)
CALCIUM: 9.2 mg/dL (ref 8.4–10.5)
CO2: 19 mmol/L (ref 19–32)
Chloride: 105 mmol/L (ref 96–112)
Creatinine, Ser: 11.3 mg/dL — ABNORMAL HIGH (ref 0.50–1.10)
GFR calc non Af Amer: 4 mL/min — ABNORMAL LOW (ref >90)
GFR, EST AFRICAN AMERICAN: 5 mL/min — AB (ref >90)
Glucose, Bld: 145 mg/dL — ABNORMAL HIGH (ref 70–99)
POTASSIUM: 4.1 mmol/L (ref 3.5–5.1)
SODIUM: 139 mmol/L (ref 135–145)

## 2014-11-16 LAB — CBG MONITORING, ED
GLUCOSE-CAPILLARY: 284 mg/dL — AB (ref 70–99)
Glucose-Capillary: 127 mg/dL — ABNORMAL HIGH (ref 70–99)
Glucose-Capillary: 59 mg/dL — ABNORMAL LOW (ref 70–99)
Glucose-Capillary: 128 mg/dL — ABNORMAL HIGH (ref 70–99)
Glucose-Capillary: 227 mg/dL — ABNORMAL HIGH (ref 70–99)
Glucose-Capillary: 294 mg/dL — ABNORMAL HIGH (ref 70–99)

## 2014-11-16 LAB — CBC
HCT: 34.2 % — ABNORMAL LOW (ref 36.0–46.0)
Hemoglobin: 11.5 g/dL — ABNORMAL LOW (ref 12.0–15.0)
MCH: 31.3 pg (ref 26.0–34.0)
MCHC: 33.6 g/dL (ref 30.0–36.0)
MCV: 92.9 fL (ref 78.0–100.0)
Platelets: 160 10*3/uL (ref 150–400)
RBC: 3.68 MIL/uL — ABNORMAL LOW (ref 3.87–5.11)
RDW: 13.7 % (ref 11.5–15.5)
WBC: 4.9 10*3/uL (ref 4.0–10.5)

## 2014-11-16 LAB — GLUCOSE, CAPILLARY: GLUCOSE-CAPILLARY: 232 mg/dL — AB (ref 70–99)

## 2014-11-16 LAB — TSH: TSH: 14.61 u[IU]/mL — ABNORMAL HIGH (ref 0.350–4.500)

## 2014-11-16 LAB — I-STAT CG4 LACTIC ACID, ED: LACTIC ACID, VENOUS: 0.69 mmol/L (ref 0.5–2.0)

## 2014-11-16 MED ORDER — SENNA 8.6 MG PO TABS
1.0000 | ORAL_TABLET | Freq: Two times a day (BID) | ORAL | Status: DC
  Administered 2014-11-16 – 2014-11-23 (×13): 8.6 mg via ORAL
  Filled 2014-11-16 (×17): qty 1

## 2014-11-16 MED ORDER — SODIUM CHLORIDE 0.9 % IV SOLN
Freq: Once | INTRAVENOUS | Status: AC
  Administered 2014-11-16: 10:00:00 via INTRAVENOUS

## 2014-11-16 MED ORDER — PANTOPRAZOLE SODIUM 40 MG PO TBEC
40.0000 mg | DELAYED_RELEASE_TABLET | Freq: Every day | ORAL | Status: DC
  Administered 2014-11-17 – 2014-11-23 (×7): 40 mg via ORAL
  Filled 2014-11-16 (×5): qty 1

## 2014-11-16 MED ORDER — LANTHANUM CARBONATE 500 MG PO CHEW
1000.0000 mg | CHEWABLE_TABLET | Freq: Three times a day (TID) | ORAL | Status: DC
Start: 2014-11-16 — End: 2014-11-23
  Administered 2014-11-17 – 2014-11-19 (×7): 1000 mg via ORAL
  Administered 2014-11-19: 500 mg via ORAL
  Administered 2014-11-20 – 2014-11-23 (×6): 1000 mg via ORAL
  Filled 2014-11-16 (×24): qty 2

## 2014-11-16 MED ORDER — SODIUM CHLORIDE 0.9 % IV SOLN: 100.0000 mL | INTRAVENOUS | Status: DC | PRN

## 2014-11-16 MED ORDER — INSULIN ASPART 100 UNIT/ML ~~LOC~~ SOLN
0.0000 [IU] | Freq: Every day | SUBCUTANEOUS | Status: DC
  Administered 2014-11-16: 2 [IU] via SUBCUTANEOUS
  Administered 2014-11-17: 5 [IU] via SUBCUTANEOUS
  Administered 2014-11-18: 2 [IU] via SUBCUTANEOUS

## 2014-11-16 MED ORDER — NEPRO/CARBSTEADY PO LIQD
237.0000 mL | ORAL | Status: DC | PRN
  Filled 2014-11-16: qty 237

## 2014-11-16 MED ORDER — ALTEPLASE 2 MG IJ SOLR
2.0000 mg | Freq: Once | INTRAMUSCULAR | Status: AC | PRN
  Filled 2014-11-16: qty 2

## 2014-11-16 MED ORDER — ACETAMINOPHEN 325 MG PO TABS
650.0000 mg | ORAL_TABLET | Freq: Four times a day (QID) | ORAL | Status: DC | PRN
  Administered 2014-11-17 – 2014-11-21 (×3): 650 mg via ORAL
  Filled 2014-11-16 (×2): qty 2

## 2014-11-16 MED ORDER — INSULIN ASPART 100 UNIT/ML ~~LOC~~ SOLN
0.0000 [IU] | Freq: Three times a day (TID) | SUBCUTANEOUS | Status: DC
  Administered 2014-11-16 – 2014-11-17 (×2): 5 [IU] via SUBCUTANEOUS
  Administered 2014-11-17: 1 [IU] via SUBCUTANEOUS
  Administered 2014-11-17: 3 [IU] via SUBCUTANEOUS
  Administered 2014-11-18: 1 [IU] via SUBCUTANEOUS
  Administered 2014-11-18: 3 [IU] via SUBCUTANEOUS
  Administered 2014-11-19: 1 [IU] via SUBCUTANEOUS
  Administered 2014-11-19: 3 [IU] via SUBCUTANEOUS
  Administered 2014-11-20: 5 [IU] via SUBCUTANEOUS
  Administered 2014-11-20: 9 [IU] via SUBCUTANEOUS
  Filled 2014-11-16 (×2): qty 1

## 2014-11-16 MED ORDER — DOXERCALCIFEROL 4 MCG/2ML IV SOLN
INTRAVENOUS | Status: AC
  Administered 2014-11-16: 18:00:00
  Filled 2014-11-16: qty 2

## 2014-11-16 MED ORDER — LIDOCAINE HCL (PF) 1 % IJ SOLN: 5.0000 mL | INTRAMUSCULAR | Status: DC | PRN

## 2014-11-16 MED ORDER — LEVOTHYROXINE SODIUM 100 MCG PO TABS
100.0000 ug | ORAL_TABLET | Freq: Every day | ORAL | Status: DC
  Administered 2014-11-17 – 2014-11-23 (×7): 100 ug via ORAL
  Filled 2014-11-16 (×10): qty 1

## 2014-11-16 MED ORDER — VANCOMYCIN HCL IN DEXTROSE 1-5 GM/200ML-% IV SOLN
1000.0000 mg | Freq: Once | INTRAVENOUS | Status: AC
  Administered 2014-11-16: 1000 mg via INTRAVENOUS
  Filled 2014-11-16: qty 200

## 2014-11-16 MED ORDER — DOCUSATE SODIUM 100 MG PO CAPS
100.0000 mg | ORAL_CAPSULE | Freq: Two times a day (BID) | ORAL | Status: DC
  Administered 2014-11-16 – 2014-11-23 (×12): 100 mg via ORAL
  Filled 2014-11-16 (×16): qty 1

## 2014-11-16 MED ORDER — ASPIRIN 81 MG PO CHEW
81.0000 mg | CHEWABLE_TABLET | Freq: Every day | ORAL | Status: DC
  Administered 2014-11-18 – 2014-11-23 (×6): 81 mg via ORAL
  Filled 2014-11-16 (×5): qty 1

## 2014-11-16 MED ORDER — CARVEDILOL 25 MG PO TABS
25.0000 mg | ORAL_TABLET | Freq: Every morning | ORAL | Status: DC
  Administered 2014-11-17 – 2014-11-23 (×7): 25 mg via ORAL
  Filled 2014-11-16 (×8): qty 1

## 2014-11-16 MED ORDER — DOXERCALCIFEROL 4 MCG/2ML IV SOLN
3.0000 ug | INTRAVENOUS | Status: DC
Start: 2014-11-18 — End: 2014-11-18
  Administered 2014-11-18: 3 ug via INTRAVENOUS
  Filled 2014-11-16: qty 2

## 2014-11-16 MED ORDER — PENTAFLUOROPROP-TETRAFLUOROETH EX AERO
1.0000 "application " | INHALATION_SPRAY | CUTANEOUS | Status: DC | PRN
  Filled 2014-11-16: qty 30

## 2014-11-16 MED ORDER — AMLODIPINE BESYLATE 10 MG PO TABS
10.0000 mg | ORAL_TABLET | Freq: Two times a day (BID) | ORAL | Status: DC
  Administered 2014-11-16 – 2014-11-23 (×13): 10 mg via ORAL
  Filled 2014-11-16 (×15): qty 1

## 2014-11-16 MED ORDER — HEPARIN SODIUM (PORCINE) 5000 UNIT/ML IJ SOLN
5000.0000 [IU] | Freq: Three times a day (TID) | INTRAMUSCULAR | Status: DC
  Administered 2014-11-16 – 2014-11-21 (×13): 5000 [IU] via SUBCUTANEOUS
  Filled 2014-11-16 (×22): qty 1

## 2014-11-16 MED ORDER — RENA-VITE PO TABS
1.0000 | ORAL_TABLET | Freq: Every day | ORAL | Status: DC
  Administered 2014-11-16 – 2014-11-22 (×6): 1 via ORAL
  Filled 2014-11-16 (×10): qty 1

## 2014-11-16 MED ORDER — ACETAMINOPHEN 650 MG RE SUPP: 650.0000 mg | Freq: Four times a day (QID) | RECTAL | Status: DC | PRN

## 2014-11-16 MED ORDER — LIDOCAINE-PRILOCAINE 2.5-2.5 % EX CREA
1.0000 "application " | TOPICAL_CREAM | CUTANEOUS | Status: DC | PRN
  Filled 2014-11-16: qty 5

## 2014-11-16 MED ORDER — DEXTROSE 50 % IV SOLN
INTRAVENOUS | Status: AC
  Filled 2014-11-16: qty 50

## 2014-11-16 MED ORDER — LISINOPRIL 20 MG PO TABS
20.0000 mg | ORAL_TABLET | Freq: Two times a day (BID) | ORAL | Status: DC
  Administered 2014-11-16 – 2014-11-21 (×10): 20 mg via ORAL
  Filled 2014-11-16 (×13): qty 1

## 2014-11-16 MED ORDER — PIPERACILLIN-TAZOBACTAM 3.375 G IVPB 30 MIN
3.3750 g | Freq: Once | INTRAVENOUS | Status: AC
  Administered 2014-11-16: 3.375 g via INTRAVENOUS
  Filled 2014-11-16: qty 50

## 2014-11-16 MED ORDER — HEPARIN SODIUM (PORCINE) 1000 UNIT/ML DIALYSIS
1000.0000 [IU] | INTRAMUSCULAR | Status: DC | PRN
  Filled 2014-11-16: qty 1

## 2014-11-16 MED ORDER — DARBEPOETIN ALFA 60 MCG/0.3ML IJ SOSY: 60.0000 ug | PREFILLED_SYRINGE | INTRAMUSCULAR | Status: DC

## 2014-11-16 MED ORDER — DEXTROSE 50 % IV SOLN
1.0000 | Freq: Once | INTRAVENOUS | Status: AC
  Administered 2014-11-16: 50 mL via INTRAVENOUS

## 2014-11-16 MED ORDER — HEPARIN SODIUM (PORCINE) 1000 UNIT/ML DIALYSIS
20.0000 [IU]/kg | INTRAMUSCULAR | Status: DC | PRN
  Filled 2014-11-16: qty 2

## 2014-11-16 MED ORDER — FAMOTIDINE 20 MG PO TABS
20.0000 mg | ORAL_TABLET | Freq: Once | ORAL | Status: AC
  Administered 2014-11-16: 20 mg via ORAL
  Filled 2014-11-16: qty 1

## 2014-11-16 NOTE — ED Notes (Signed)
Per pharmacy we are to hold all admit medications until verified, unless immediatly neccessary for pt care.

## 2014-11-16 NOTE — ED Notes (Signed)
Patient now with normal temp.  Bare hugger removed

## 2014-11-16 NOTE — ED Notes (Addendum)
Patient brought in by ems, reported to have had a seizure.  Her roommate heard her making noises.  EMS called.  CBG reported to be 22 on scene.  Patient was given amp d50.  Repeat cbg 107.  Seizure reported to last 5 min.  Patient was found to be diaphoretic on scene. Patient was just discharged from hospital on yesterday.  She is due for dialysis today.  Patient is alert and oriented at this time.  She is visually impaired.  Patient denies pain but states she is having numbness in her face that moves from the top half to the lower half.   Dialysis access is in the right leg

## 2014-11-16 NOTE — H&P (Signed)
Family Medicine Teaching [facility] Admission History and Physical Service Pager: [phone]  Patient name: [name] Medical record number: [id number] Date of birth: 05/10/1981 Age: 34 y.o. Gender: female  Primary Care Provider: Hazeline Junker, MD Consultants: Nephrology Code Status: Full   Chief Complaint: Hypoglycemia with hypothermia  Assessment and Plan: [name] is a 34 y.o. female presenting with hyperglycemia and hypoglycemia. PMH is significant for type 1 diabetes, diabetic retinopathy/blind, frequent DKA, DVT of upper extremity, end-stage renal disease on HD, hypertension.  Hypoglycemia/DM type I: Brittle diabetic history. Reported roommate heard seizure noises early this am, EMS called CBG 22, treated in route to ED. ED CBG 59->127. Pt states she has been compliant with medications and using as directed. She has been having digestive issues and took flax seed last night. Yesterday woke up with CBG 500. Most likely highs and lows related to incorrect insulin dosage. - sensitive SSI, will calculate need over 24 hours to adjust home medications.  - Carb modified diet - hypoglycemia protocol.   Hypothermia: Unknown cause of hypothermia, considering hypothyroidism vs infection. Patient temp on ED arrival 93.4. She was placed on warming blanket, current temperature 95.8. WBC 4.5. TSH slightly elevated to 5.35 last admission and now 14.61 in ED. Hypoglycemia from insulin overdose still most likely cause. - Beir hugger as needed - Continue synthroid daily - Lactic acid normal - Blood cultures drawn in ED and vanc/zosyn given - no further abx at this time  ESRD on HD- Receives dialysis on Tuesday, Thursday, Saturday. Last dialysis was Tuesday.  - Cr on admission 11.3, BUN 65 - Electrolytes abnormalities listed above - Consult Nephrology. - Renal panel after HD tomorrow  Abdominal pain/constipation: CT on last admission positive for fluid overload, and fatty liver.  WBC normal. Appears epigastric in nature. WBC within normal limits, troponin negative, white count normal. Lactic acid normal. - Consider further abdominal imaging as patient believes there is something going on in her gut that is causing these recurrent issues. Has had negative renal stone CT, KUB and abdominal U/S in last month. - Bowel regimen: Colace/Senna daily, suppository PRN  # HTN:  - Home medications include Amlodipine , Coreg  BID, and Lisinopril.  - Continue Amlodipine, Coreg, and Lisinopril   # Suicidal Ideation- Noted on a hospitalization prior  - Continue to monitor throughout hospitalization  FEN/GI: Carb Modified diet Prophylaxis: Subcutaneous Heparin  Disposition: Admitted to [facility] Medicine Teaching Service Dr. Leveda Anna attending.    History of Present Illness: [name] is a 34 y.o. female presenting with hypoglycemia and hypothermia, roommate heard strange noises early this am. EMS was called and CBG was 22, pt was given D50 and she reports she then became more alert but still felt somewhat lethargic. Upon arrival to ED CBG was 59 and she was given another amp after which CBG was 127. Pt was hypothermic with temperature of 93.4. Warming blanket was applied and her temperature steadily rose to 95.8. Pt states she is uncertain of anything that happened after she went to bed last night. The next thing she remembers is EMS at her home. Pt reports compliance with insulin regimen. She reports that when she awoke yesterday morning her glucose was >500 but she gradually got it down over the day and when she went to bed it was 87 so she ate a snack.  Patient states she thinks that her recent problems are all coming from "digestive issues ". She has frequent constipation, having a bowel movement 1-2 times  a week.   Review Of Systems: Per HPI with the following additions: per HPI Otherwise 12 point review of systems was performed and was  unremarkable.  Patient Active Problem List   Diagnosis Date Noted  . 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 Hypoglycemia 11/12/2014  . Altered mental status   . Epigastric abdominal pain   . Hypothermia   . Constipation   . Abdominal pain   . Hyponatremia   . Nausea with vomiting   . Type 1 diabetes mellitus with complications   . Hypertensive urgency   . ESRD on dialysis   . Hyperkalemia   . Hyperglycemia   . Abdominal pain, acute   . Diabetic ketoacidosis without coma associated with diabetes mellitus due to underlying condition   . DKA (diabetic ketoacidoses) 10/24/2014  . Major depressive disorder, recurrent, severe without psychotic features   . ESRD (end stage renal disease) 09/27/2014  . Hip pain   . Recurrent major depression-severe 09/26/2014  . Suicidal behavior 09/25/2014  . Suicidal ideation 09/25/2014  . Peripheral polyneuropathy 09/19/2014  . HTN (hypertension), malignant 06/29/2014  . Abnormal EKG 06/29/2014  . Chest pain with moderate risk of acute coronary syndrome 06/28/2014  . Atypical chest pain 06/28/2014  . ESRD (end stage renal disease) on dialysis 11/11/2013  . Hypothyroidism 06/10/2013  . Diabetic retinopathy-Blind 04/21/2013  . Essential hypertension, benign 04/21/2013  . DVT of upper extremity (deep vein thrombosis) 04/02/2013  . DM (diabetes mellitus), type 1 04/02/2013   Past Medical History: Past Medical History  Diagnosis Date  . 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 Hypothyroidism   . Hypertension   . Type I diabetes mellitus   . DVT (deep venous thrombosis)     "both arms" (11/11/2013)  . High cholesterol   . Diabetic retinopathy associated with type 1 diabetes mellitus   . Heart murmur     "slight" (11/11/2013)  . Graves' disease     "had thyroid radioactively treated" (11/11/2013)  . End-stage renal disease on hemodialysis     "TTS; [place]" (11/11/2013)   Past Surgical History: Past Surgical History   Procedure Laterality Date  . Insertion of dialysis catheter N/A 04/01/2013    Procedure: INSERTION OF DIALYSIS CATHETER;  Surgeon: Pryor OchoaJames D Lawson, MD;  [facility];  Service: Vascular;  Laterality: N/A;  . Av fistula placement Right 04/07/2013    Procedure: INSERTION OF ARTERIOVENOUS (AV) GORE-TEX GRAFT THIGH;  Surgeon: Chuck Hinthristopher S Dickson, MD;  [facility];  Service: Vascular;  Laterality: Right;  . Cataract extraction w/ intraocular lens implant Right 2009  . Eye surgery Right 2009    "laser OR for diabetic retinopathy" (05/23/2013)  . Enucleation Left ~ 2010  . Revision of arteriovenous goretex graft Right 11/11/2013    Procedure: REVISION OF ARTERIOVENOUS GORETEX GRAFT - RIGHT THIGH;  Surgeon: Chuck Hinthristopher S Dickson, MD;  [facility];  Service: Vascular;  Laterality: Right;  [name]. Venogram Bilateral 04/04/2013    Procedure: VENOGRAM;  Surgeon: Chuck Hinthristopher S Dickson, MD;  [facility];  Service: Cardiovascular;  Laterality: Bilateral;   Social History: History  Substance Use Topics  . Smoking status: Never Smoker   . Smokeless tobacco: Never Used  . Alcohol Use: No   Additional social history: per HPI Please also refer to relevant sections of EMR.  Family History: Family History  Problem Relation Age of Onset  . Hypertension    . Cancer    .  Thyroid disease    . Cancer Mother   . Asthma Father    Allergies and Medications: No Known Allergies No current facility-administered medications on file prior to encounter.   Current Outpatient Prescriptions on File Prior to Encounter  Medication Sig Dispense Refill  . aspirin 81 MG chewable tablet Chew 1 tablet (81 mg total) by mouth daily. 30 tablet 0  . carvedilol (COREG) 25 MG tablet Take 25 mg by mouth every morning.    . insulin aspart (NOVOLOG) 100 UNIT/ML injection Inject 4 Units into the skin 3 (three) times daily with meals. (Patient taking differently: Inject 7 Units into the skin 3 (three) times daily with  meals. ) 10 mL 11  . insulin glargine (LANTUS) 100 UNIT/ML injection Inject 0.08 mLs (8 Units total) into the skin 2 (two) times daily. 10 mL 11  . levothyroxine (SYNTHROID, LEVOTHROID) 100 MCG tablet Take 1 tablet by mouth  daily 90 tablet 3  . multivitamin (RENA-VIT) TABS tablet Take 1 tablet by mouth at bedtime. 30 tablet 1  . pantoprazole (PROTONIX) 40 MG tablet Take 1 tablet (40 mg total) by mouth daily. 30 tablet 0  . amLODipine (NORVASC) 10 MG tablet Take 10 mg by mouth 2 (two) times daily.    Marland Kitchen gabapentin (NEURONTIN) 300 MG capsule Take 1 capsule (300 mg total) by mouth daily. (Patient not taking: Reported on 10/24/2014) 30 capsule 3  . lanthanum (FOSRENOL) 1000 MG chewable tablet Chew 1,000 mg by mouth as needed (phosphante).      Objective: BP 138/75 mmHg  Pulse 85  Temp(Src) 95.8 F (35.4 C) (Rectal)  Resp 23  Wt 130 lb (58.968 kg)  SpO2 99%  LMP 10/16/2014 Exam: Gen: NAD. Cooperative with exam. Well-developed, well-nourished.  HEENT: AT. [place].Bilateral eyes with injections, no icterus. Mildly dry mucous membranes. CV: RRR. Murmur appreciated. Chest: CTAB, no wheeze or crackles. Normal work of breath Abd: Soft. Mild epigastric discomfort. ND. BS normal. No Masses palpated.  Ext: No erythema. No edema. +2/4 PT Skin: Hyperpigmentation/healing rash right side of abdomen. Warm and well-perfused. Skin intact.   Neuro: PERLA. EOMi. Alert. Oriented. No focal deficits.   Labs and Imaging: CBC BMET   Recent Labs Lab 11/16/14 0825  WBC 4.9  HGB 11.5*  HCT 34.2*  PLT 160    Recent Labs Lab 11/16/14 0825  NA 139  K 4.1  CL 105  CO2 19  BUN 65*  CREATININE 11.30*  GLUCOSE 145*  CALCIUM 9.2    TSH 14.61 Lactic acid 0.69 BCx x2 pending  Dg Chest Portable 1 View  11/16/2014   CLINICAL DATA:  Low blood sugar.  EXAM: PORTABLE CHEST - 1 VIEW  COMPARISON:  11/12/2014  FINDINGS: Although borderline, no cardiomegaly when accounting for technique and mildly low lung volumes.  Aortic and hilar contours are normal. No acute infiltrate or edema. No effusion or pneumothorax. No acute osseous findings.  IMPRESSION: Negative chest.   Electronically Signed   By: Marnee Spring M.D.   On: 11/16/2014 08:15     Abram Sander, MD 11/16/2014, 10:56 AM PGY-2, [facility] FPTS Intern pager: [phone], text pages welcome

## 2014-11-16 NOTE — ED Provider Notes (Signed)
CSN: [id number]     Arrival date & time 11/16/14  4098 History   First MD Initiated Contact with Patient 11/16/14 [phone]     Chief Complaint  Patient presents with  . Hypoglycemia     (Consider location/radiation/quality/duration/timing/severity/associated sxs/prior Treatment) Patient is a 34 y.o. female presenting with hypoglycemia. The history is provided by the patient.  Hypoglycemia Initial blood sugar:  22 Blood sugar after intervention:  107, 59 Severity:  Severe Onset quality:  Gradual Timing:  Constant Progression:  Unchanged Chronicity:  Recurrent Diabetic status:  Controlled with insulin Current diabetic therapy:  Lantus, SSI Context: recent illness (recnetly here with hypoglycemia)   Context: not decreased oral intake and not diet changes   Relieved by:  Nothing Ineffective treatments:  None tried Associated symptoms: altered mental status   Associated symptoms: no shortness of breath, no speech difficulty, no visual change, no vomiting and no weakness     Past Medical History  Diagnosis Date  . 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 Hypothyroidism   . Hypertension   . Type I diabetes mellitus   . DVT (deep venous thrombosis)     "both arms" (11/11/2013)  . High cholesterol   . Diabetic retinopathy associated with type 1 diabetes mellitus   . Heart murmur     "slight" (11/11/2013)  . Graves' disease     "had thyroid radioactively treated" (11/11/2013)  . End-stage renal disease on hemodialysis     "TTS; [place]" (11/11/2013)   Past Surgical History  Procedure Laterality Date  . Insertion of dialysis catheter N/A 04/01/2013    Procedure: INSERTION OF DIALYSIS CATHETER;  Surgeon: Pryor Ochoa, MD;  Location: [facility] OR;  Service: Vascular;  Laterality: N/A;  . Av fistula placement Right 04/07/2013    Procedure: INSERTION OF ARTERIOVENOUS (AV) GORE-TEX GRAFT THIGH;  Surgeon: Chuck Hint, MD;  Location: [facility] OR;  Service: Vascular;   Laterality: Right;  . Cataract extraction w/ intraocular lens implant Right 2009  . Eye surgery Right 2009    "laser OR for diabetic retinopathy" (05/23/2013)  . Enucleation Left ~ 2010  . Revision of arteriovenous goretex graft Right 11/11/2013    Procedure: REVISION OF ARTERIOVENOUS GORETEX GRAFT - RIGHT THIGH;  Surgeon: Chuck Hint, MD;  Location: [facility] OR;  Service: Vascular;  Laterality: Right;  [name] Bilateral 04/04/2013    Procedure: VENOGRAM;  Surgeon: Chuck Hint, MD;  Location: [facility] CATH LAB;  Service: Cardiovascular;  Laterality: Bilateral;   Family History  Problem Relation Age of Onset  . Hypertension    . Cancer    . Thyroid disease    . Cancer Mother   . Asthma Father    History  Substance Use Topics  . Smoking status: Never Smoker   . Smokeless tobacco: Never Used  . Alcohol Use: No   OB History    No data available     Review of Systems  Constitutional: Negative for fever and chills.  Respiratory: Negative for shortness of breath.   Gastrointestinal: Negative for vomiting.  Neurological: Negative for speech difficulty and weakness.  All other systems reviewed and are negative.     Allergies  Review of patient's allergies indicates no known allergies.  Home Medications   Prior to Admission medications   Medication Sig Start Date End Date Taking? Authorizing Provider  amLODipine (NORVASC) 10 MG tablet Take 10 mg by mouth 2 (two) times daily.    Historical  Provider, MD  aspirin 81 MG chewable tablet Chew 1 tablet (81 mg total) by mouth daily. Patient not taking: Reported on 10/24/2014 06/30/14   Lora Havensaleigh N Rumley, DO  carvedilol (COREG) 25 MG tablet Take 25 mg by mouth every morning.    Historical Provider, MD  gabapentin (NEURONTIN) 300 MG capsule Take 1 capsule (300 mg total) by mouth daily. Patient not taking: Reported on 10/24/2014 09/27/14   Raliegh IpAshly M Gottschalk, DO  insulin aspart (NOVOLOG) 100 UNIT/ML injection Inject 4 Units into the  skin 3 (three) times daily with meals. 11/14/14   Ashly Hulen SkainsM Gottschalk, DO  insulin glargine (LANTUS) 100 UNIT/ML injection Inject 0.08 mLs (8 Units total) into the skin 2 (two) times daily. 11/14/14   Ashly Hulen SkainsM Gottschalk, DO  lanthanum (FOSRENOL) 1000 MG chewable tablet Chew 1,000 mg by mouth as needed (phosphante).    Historical Provider, MD  levothyroxine (SYNTHROID, LEVOTHROID) 100 MCG tablet Take 1 tablet by mouth  daily 09/11/14   Tyrone Nineyan B Grunz, MD  multivitamin (RENA-VIT) TABS tablet Take 1 tablet by mouth at bedtime. 09/27/14   Ashly Hulen SkainsM Gottschalk, DO  pantoprazole (PROTONIX) 40 MG tablet Take 1 tablet (40 mg total) by mouth daily. 06/30/14   [place] N Rumley, DO   BP 135/82 mmHg  Pulse 69  Resp 12  Wt 130 lb (58.968 kg)  SpO2 100%  LMP 10/16/2014 Physical Exam  Constitutional: She is oriented to person, place, and time. She appears well-developed and well-nourished. No distress.  HENT:  Head: Normocephalic and atraumatic.  Mouth/Throat: Oropharynx is clear and moist.  Eyes: EOM are normal. Pupils are equal, round, and reactive to light.  Neck: Normal range of motion. Neck supple.  Cardiovascular: Normal rate and regular rhythm.  Exam reveals no friction rub.   No murmur heard. Pulmonary/Chest: Effort normal and breath sounds normal. No respiratory distress. She has no wheezes. She has no rales.  Abdominal: Soft. She exhibits no distension. There is no tenderness. There is no rebound.  Musculoskeletal: Normal range of motion. She exhibits no edema.  Neurological: She is alert and oriented to person, place, and time. No cranial nerve deficit. She exhibits normal muscle tone. Coordination normal.  Skin: No rash noted. She is not diaphoretic.  Nursing note and vitals reviewed.   ED Course  Procedures (including critical care time) Labs Review Labs Reviewed  CBG MONITORING, ED - Abnormal; Notable for the following:    Glucose-Capillary 59 (*)    All other components within normal limits   CBC  BASIC METABOLIC PANEL    Imaging Review No results found.   EKG Interpretation   Date/Time:  Thursday November 16 2014 07:35:56 EST Ventricular Rate:  66 PR Interval:  168 QRS Duration: 79 QT Interval:  445 QTC Calculation: 466 R Axis:   73 Text Interpretation:  Sinus rhythm Probable left atrial enlargement  Similar to prior No significant change since last tracing Confirmed by  Gwendolyn GrantWALDEN  MD, Renata Gambino ([id number]) on 11/16/2014 7:44:29 AM     CRITICAL CARE Performed by: Dagmar HaitWALDEN, WILLIAM Melodye Swor   Total critical care time: 30 minutes  Critical care time was exclusive of separately billable procedures and treating other patients.  Critical care was necessary to treat or prevent imminent or life-threatening deterioration.  Critical care was time spent personally by me on the following activities: development of treatment plan with patient and/or surrogate as well as nursing, discussions with consultants, evaluation of patient's response to treatment, examination of patient, obtaining history from patient or  surrogate, ordering and performing treatments and interventions, ordering and review of laboratory studies, ordering and review of radiographic studies, pulse oximetry and re-evaluation of patient's condition.   MDM   Final diagnoses:  None    47F with hx of ESRD, recent admission for hypoglycemia here with hypoglycemia. Roommate called EMS because of possible seizure like activity. Patient here with BS 107 en route, was 22 on scene. Repeat here 59. Another D50 bolus given.  Hypothermic rectally. Bair hugger initiated, sepsis labs sent, cultures sent. Vanc/Zosyn started since this hypoglycemia could be a result of severe sepsis. Admitted to [facility]. TSH markedly elevated at this visit.     Elwin Mocha, MD 11/16/14 [phone]

## 2014-11-16 NOTE — ED Notes (Signed)
Patient with complaints of stomach burning/pain.  MD aware.   pepcid ordered.  IV team now at bedside.

## 2014-11-16 NOTE — Progress Notes (Signed)
Pt HD tx started w/ no complications. Pt from ED for hypoglycemia and hypothermia. Pt. Expressed that her living conditions are making her unhappy.  She said her roommate does not help with keeping their place clean. Pt. Stated that she is trying to move to [place] because she has a friend there that she can be roommates with.

## 2014-11-16 NOTE — Progress Notes (Signed)
[facility] Progress Note  Assessment/Plan: 1. Recurrent hypoglycemia and hypothermia Type 1 DM - repeat BC drawn - no grown on previous cultures  Also cardiac w/u done last admit on 2/8 and trop/ EKG's were normal ruling out silent MI in diabetic.  2. ESRD - TTS - HD today per routine; she has been having access problems and we were unable to get a shuntagram done last admission.  Have scheduled it to be done by IR on Friday and if that is successful, we will need to cancel her Monday 2/15 10 am appt at [facility] 3. Anemia -  Continue same anemia regimen - already had Aranesp this week - due next Tuesday 4. Secondary hyperparathyroidism - continue hectorol - takes fosrenol 1 gm ac prn how she eats. 5. HTN/volume - ok - for HD today - keep edw at 60, usual meds 6. Nutrition - low blood sugar might be due to inadequate intake in the setting of brittle diabetes/ though could have error in insulin dose 7. Disp - needs case management 8. Hypothyroidism - synthroid per primary  Sheffield SliderMartha B Bergman, PA-C [facility] Beeper [phone] 11/16/2014,1:53 PM  LOS: 0 days   Pt seen, examined and agree w A/P as above.  Vinson Moselleob Estalee Mccandlish MD pager [phone]    cell [phone] 11/16/2014, 3:04 PM    Subjective:  Pt thinks her low BS and hypothermia may be environmental , due to living in a less than clean situation with 5 animals and with her 3 adults. Cooks own food or [place] [employer]  She mentioned this to me in one of her December admission.  Said she had made a porkchop cooked in coconut mild with chiles and okra like a stew yesterday.  Took some flax seed yestserday for constipation that cleaned her out but still feels full.  Doesn't have a lot of affordable housing options made worse by blindness and lack of family support.  Worried about the seizure report - that never happened before.  Objective Filed Vitals:   11/16/14 1115 11/16/14 1130 11/16/14 1137 11/16/14  1145  BP: 128/66  137/70 125/66  Pulse: 81  77 77  Temp:  98 F (36.7 C)    TempSrc:  Oral    Resp: 20  28 17   Weight:      SpO2:   99%    Physical Exam General:eyes closed, alert and articulate about situation, somewhat sweaty Heart: RRR Lungs: no rales Abdomen: soft NT Extremities: no LE edema Dialysis Access:  Right thigh AVGG + bruit  Dialysis Orders: TTS NW 4 hr 180 2K/2.25ca 60kgs 3000u hep 400/1.5  Aranesp 60 q week hectorol 3 Q hd  Additional Objective Labs: Basic Metabolic Panel:  Recent Labs Lab 11/12/14 1942 11/13/14 0133 11/14/14 0630 11/16/14 0825  NA 138 134* 133* 139  K 3.9 4.1 3.5 4.1  CL 99 96 95* 105  CO2 24 24 22 19   GLUCOSE 412* 374* 204* 145*  BUN 40* 50* 75* 65*  CREATININE 8.67* 9.41* 11.90* 11.30*  CALCIUM 8.5 8.3* 8.3* 9.2  PHOS 5.7*  --  7.9*  --    Liver Function Tests:  Recent Labs Lab 11/12/14 1942 11/14/14 0630  AST 15  --   ALT 13  --   ALKPHOS 64  --   BILITOT 0.9  --   PROT 7.0  --   ALBUMIN 3.5 3.1*  CBC:  Recent Labs Lab 11/12/14 0621 11/12/14 1349 11/13/14 0133 11/14/14 0630  11/16/14 0825  WBC 6.7 4.9 5.6 4.5 4.9  NEUTROABS 4.6  --  3.0  --   --   HGB 13.5 13.1 10.6* 10.4* 11.5*  HCT 40.1 38.2 31.5* 29.5* 34.2*  MCV 93.7 92.3 93.8 89.4 92.9  PLT 186 195 171 161 160   Blood Culture    Component Value Date/Time   SDES BLOOD RIGHT ARM 11/12/2014 0810   SPECREQUEST  11/12/2014 0810    BOTTLES DRAWN AEROBIC AND ANAEROBIC BLUE 10 CC RED 5 CC   CULT  11/12/2014 0810           BLOOD CULTURE RECEIVED NO GROWTH TO DATE CULTURE WILL BE HELD FOR 5 DAYS BEFORE ISSUING A FINAL NEGATIVE REPORT Performed at [facility]    REPTSTATUS PENDING 11/12/2014 0810    Cardiac Enzymes:  Recent Labs Lab 11/12/14 1349 11/12/14 1942 11/13/14 0133  TROPONINI <0.03 <0.03 <0.03   CBG:  Recent Labs Lab 11/13/14 2159 11/14/14 1151 11/16/14 0738 11/16/14 0828 11/16/14 0916  GLUCAP 275* 189*  59* 127* 128*  Studies/Results: Dg Chest Portable 1 View  11/16/2014   CLINICAL DATA:  Low blood sugar.  EXAM: PORTABLE CHEST - 1 VIEW  COMPARISON:  11/12/2014  FINDINGS: Although borderline, no cardiomegaly when accounting for technique and mildly low lung volumes. Aortic and hilar contours are normal. No acute infiltrate or edema. No effusion or pneumothorax. No acute osseous findings.  IMPRESSION: Negative chest.   Electronically Signed   By: Marnee Spring M.D.   On: 11/16/2014 08:15   Medications:   . amLODipine  10 mg Oral BID  . aspirin  81 mg Oral Daily  . carvedilol  25 mg Oral q morning - 10a  . docusate sodium  100 mg Oral BID  . heparin  5,000 Units Subcutaneous 3 times per day  . insulin aspart  0-5 Units Subcutaneous QHS  . insulin aspart  0-9 Units Subcutaneous TID WC  . lanthanum  1,000 mg Oral TID WC  . levothyroxine  100 mcg Oral QAC breakfast  . lisinopril  20 mg Oral BID  . multivitamin  1 tablet Oral QHS  . pantoprazole  40 mg Oral Daily  . senna  1 tablet Oral BID

## 2014-11-16 NOTE — ED Notes (Signed)
Patient is now complaining of pain behind her left eye

## 2014-11-16 NOTE — Progress Notes (Signed)
[name]'s roommate,"[name]" [facility] [employer].States need to speak with SW and patient and herself re: disposition. Ms.[name],"spoke with patient's only sister,[name] ," who asks that Ms.[name] act as patient's advocate since she can not be here till Saturday, due to working 10 hr. shifts and [place]. Per Ms.[name], if patient can be placed in Assisted Living, [name] is willing to finance. Susan will be here in am approx .9am and is willing to bring "bag" of patient's meds for verification. Susan is concerned that patient may not be taking medication correctly and is depressed.

## 2014-11-16 NOTE — ED Notes (Signed)
Report to dialysis unit-- will transfer to dialysis after eating lunch

## 2014-11-16 NOTE — Progress Notes (Addendum)
Family Practice Teaching Service Interval Progress Note  S: Paged by nurse to evaluate patient approx 2200, patient had returned from HD had not eaten dinner and complaints to RN after discussion regarding current insulin regimen, patient upset about not getting lantus at this time. Additionally, started crying and complained of chest discomfort. Speaking with patient she had calmed down, resting in bed, asking about the plan to check her blood sugars, requesting to be checked every 4 hours with appropriate coverage, explaining that she is Type 1 DM. Admits to feeling generalized weakness, some episode of sweating, and currently hungry, now has subway at bedside.  O: BP 178/87 mmHg  Pulse 83  Temp(Src) 97.7 F (36.5 C) (Oral)  Resp 20  Ht 5\' 3"  (1.6 m)  Wt 131 lb 9.8 oz (59.7 kg)  BMI 23.32 kg/m2  SpO2 100%  LMP 10/16/2014 Gen - resting in bed, comfortable, NAD Heart - RRR, 2/6 systolic murmur heard over LSB unchanged Chest Wall - tender to palpation over mid sternum Skin - mild wet from prior perspiration but no active diaphoresis  A&P: Briefly, 33 yr F admitted for hypoglycemia, hypothermia in brittle T1DM, recently admitted for the same complaints PMH is significant for type 1 diabetes, diabetic retinopathy/blind, frequent DKA, DVT of upper extremity, ESRD on HD, hypertension. - Currently well-appearing, resting in bed, symptoms improving, vitals stable, last CBG 232, temp 97.7  # Chest pain - Atypical - Most likely atypical CP with anxiety (pt significantly agitated earlier and verbally upset with nursing when they checked on patient). Additionally, admits to significant emotional stress with home situation / social stressors - Recently ruled out MI during last admission 2/7 to 2/8 with neg trops, EKG stable - Repeat EKG per nursing tonight, mostly unchanged with mildly elevated HR, minimal non-specific ST-T changes with V2 T wave inversion, otherwise no acute ischemic changes,  significant baseline artifact - Ordered Troponin-I x 2 q 6 hr to rule out MI (given vasculopathic hx with DM) - on ASA daily, on BB daily  # T1DM - Continue sensitive SSI coverage, qhs coverage - may need dinner late, will provide SSI coverage - re-check CBG q 4 hours, may provide coverage if >300s - day team tomorrow to calculate basal insulin dose based on SSI - cautious about hypoglycemia with lantus (patient poor PO), recommend holding basal tonight  Saralyn PilarAlexander Karamalegos, DO Family Medicine PGY-2 Service Pager [phone]

## 2014-11-16 NOTE — Progress Notes (Signed)
Dear Doctor: This patient has been identified as a candidate for central line for the following reason (s): drug pH or osmolality (causing phlebitis, infiltration in 24 hours) If you agree, please write an order for the indicated device.  Thank you for supporting the early vascular access assessment program. Consuello Masseimmons, Leylany Nored M  (

## 2014-11-17 ENCOUNTER — Inpatient Hospital Stay (HOSPITAL_COMMUNITY): Payer: 59

## 2014-11-17 LAB — BASIC METABOLIC PANEL
Anion gap: 21 — ABNORMAL HIGH (ref 5–15)
BUN: 29 mg/dL — ABNORMAL HIGH (ref 6–23)
CALCIUM: 9 mg/dL (ref 8.4–10.5)
CO2: 17 mmol/L — ABNORMAL LOW (ref 19–32)
Chloride: 94 mmol/L — ABNORMAL LOW (ref 96–112)
Creatinine, Ser: 6.74 mg/dL — ABNORMAL HIGH (ref 0.50–1.10)
GFR calc Af Amer: 9 mL/min — ABNORMAL LOW (ref >90)
GFR, EST NON AFRICAN AMERICAN: 7 mL/min — AB (ref >90)
Glucose, Bld: 545 mg/dL — ABNORMAL HIGH (ref 70–99)
Potassium: 5.7 mmol/L — ABNORMAL HIGH (ref 3.5–5.1)
SODIUM: 132 mmol/L — AB (ref 135–145)

## 2014-11-17 LAB — RAPID URINE DRUG SCREEN, HOSP PERFORMED
AMPHETAMINES: NOT DETECTED
Barbiturates: NOT DETECTED
Benzodiazepines: NOT DETECTED
Cocaine: NOT DETECTED
OPIATES: NOT DETECTED
Tetrahydrocannabinol: NOT DETECTED

## 2014-11-17 LAB — CBC
HCT: 39 % (ref 36.0–46.0)
HCT: 33 % — ABNORMAL LOW (ref 36.0–46.0)
Hemoglobin: 13.9 g/dL (ref 12.0–15.0)
Hemoglobin: 10.9 g/dL — ABNORMAL LOW (ref 12.0–15.0)
MCH: 32.9 pg (ref 26.0–34.0)
MCH: 31.4 pg (ref 26.0–34.0)
MCHC: 35.6 g/dL (ref 30.0–36.0)
MCHC: 33 g/dL (ref 30.0–36.0)
MCV: 92.2 fL (ref 78.0–100.0)
MCV: 95.1 fL (ref 78.0–100.0)
Platelets: 178 10*3/uL (ref 150–400)
Platelets: 188 10*3/uL (ref 150–400)
RBC: 4.23 MIL/uL (ref 3.87–5.11)
RBC: 3.47 MIL/uL — ABNORMAL LOW (ref 3.87–5.11)
RDW: 13.5 % (ref 11.5–15.5)
RDW: 13.8 % (ref 11.5–15.5)
WBC: 5.4 10*3/uL (ref 4.0–10.5)
WBC: 5 10*3/uL (ref 4.0–10.5)

## 2014-11-17 LAB — TROPONIN I
TROPONIN I: 0.03 ng/mL (ref <0.031)
Troponin I: <0.03 ng/mL (ref <0.031)

## 2014-11-17 LAB — GLUCOSE, CAPILLARY
GLUCOSE-CAPILLARY: 226 mg/dL — AB (ref 70–99)
GLUCOSE-CAPILLARY: 343 mg/dL — AB (ref 70–99)
Glucose-Capillary: 158 mg/dL — ABNORMAL HIGH (ref 70–99)
Glucose-Capillary: 174 mg/dL — ABNORMAL HIGH (ref 70–99)
Glucose-Capillary: 507 mg/dL — ABNORMAL HIGH (ref 70–99)
Glucose-Capillary: 264 mg/dL — ABNORMAL HIGH (ref 70–99)
Glucose-Capillary: 172 mg/dL — ABNORMAL HIGH (ref 70–99)
Glucose-Capillary: 123 mg/dL — ABNORMAL HIGH (ref 70–99)

## 2014-11-17 MED ORDER — IOHEXOL 300 MG/ML  SOLN
100.0000 mL | Freq: Once | INTRAMUSCULAR | Status: AC | PRN
  Administered 2014-11-17: 60 mL via INTRAVENOUS

## 2014-11-17 MED ORDER — INSULIN GLARGINE 100 UNIT/ML ~~LOC~~ SOLN
8.0000 [IU] | Freq: Two times a day (BID) | SUBCUTANEOUS | Status: DC
  Administered 2014-11-17 – 2014-11-18 (×3): 8 [IU] via SUBCUTANEOUS
  Filled 2014-11-17 (×4): qty 0.08

## 2014-11-17 MED ORDER — INSULIN ASPART 100 UNIT/ML ~~LOC~~ SOLN
12.0000 [IU] | Freq: Once | SUBCUTANEOUS | Status: AC
  Administered 2014-11-17: 12 [IU] via SUBCUTANEOUS

## 2014-11-17 MED ORDER — LIDOCAINE HCL 1 % IJ SOLN
INTRAMUSCULAR | Status: AC
  Filled 2014-11-17: qty 20

## 2014-11-17 NOTE — Procedures (Signed)
R fem shuntogram, art and venous anast PTA No complication No blood loss. See complete dictation in [facility] P[facility].

## 2014-11-17 NOTE — Progress Notes (Addendum)
Referring Physician(s): Nephrology  Subjective: Patient scheduled for shuntogram secondary to decreased access flows.   Allergies: Review of patient's allergies indicates no known allergies.  Medications: Prior to Admission medications   Medication Sig Start Date End Date Taking? Authorizing Provider  amLODipine (NORVASC) 10 MG tablet Take 10 mg by mouth 2 (two) times daily.   Yes Historical Provider, MD  aspirin 81 MG chewable tablet Chew 1 tablet (81 mg total) by mouth daily. 06/30/14  Yes [place] N Rumley, DO  carvedilol (COREG) 25 MG tablet Take 25 mg by mouth every morning.   Yes Historical Provider, MD  insulin aspart (NOVOLOG) 100 UNIT/ML injection Inject 4 Units into the skin 3 (three) times daily with meals. Patient taking differently: Inject 7 Units into the skin 3 (three) times daily with meals.  11/14/14  Yes Ashly M Gottschalk, DO  insulin glargine (LANTUS) 100 UNIT/ML injection Inject 0.08 mLs (8 Units total) into the skin 2 (two) times daily. 11/14/14  Yes Ashly M Gottschalk, DO  lanthanum (FOSRENOL) 1000 MG chewable tablet Chew 1,000 mg by mouth as needed (phosphante).   Yes Historical Provider, MD  levothyroxine (SYNTHROID, LEVOTHROID) 100 MCG tablet Take 1 tablet by mouth  daily 09/11/14  Yes Tyrone Nine, MD  lisinopril (PRINIVIL,ZESTRIL) 20 MG tablet Take 20 mg by mouth 2 (two) times daily.   Yes Historical Provider, MD  multivitamin (RENA-VIT) TABS tablet Take 1 tablet by mouth at bedtime. 09/27/14  Yes Ashly M Gottschalk, DO  pantoprazole (PROTONIX) 40 MG tablet Take 1 tablet (40 mg total) by mouth daily. 06/30/14  Yes [place] N Rumley, DO  gabapentin (NEURONTIN) 300 MG capsule Take 1 capsule (300 mg total) by mouth daily. Patient not taking: Reported on 10/24/2014 09/27/14   Raliegh Ip, DO    Review of Systems  Vital Signs: BP 149/79 mmHg  Pulse 63  Temp(Src) 99.4 F (37.4 C) (Oral)  Resp 17  Ht  (1.6 m)  Wt 131 lb 9.8 oz (59.7 kg)  BMI 23.32 kg/m2   SpO2 100%  LMP 10/16/2014  Physical Exam General: A&Ox3, NAD Heart: RRR without M/G/R Lungs: CTA b/l Ext: Right thigh AVG (+) bruit  Imaging: Dg Chest Portable 1 View  11/16/2014   CLINICAL DATA:  Low blood sugar.  EXAM: PORTABLE CHEST - 1 VIEW  COMPARISON:  11/12/2014  FINDINGS: Although borderline, no cardiomegaly when accounting for technique and mildly low lung volumes. Aortic and hilar contours are normal. No acute infiltrate or edema. No effusion or pneumothorax. No acute osseous findings.  IMPRESSION: Negative chest.   Electronically Signed   By: Marnee Spring M.D.   On: 11/16/2014 08:15    Labs:  CBC:  Recent Labs  11/13/14 0133 11/14/14 0630 11/16/14 0825 11/17/14 0505  WBC 5.6 4.5 4.9 5.4  HGB 10.6* 10.4* 11.5* 13.9  HCT 31.5* 29.5* 34.2* 39.0  PLT 171 161 160 178    COAGS:  Recent Labs  06/26/14 1521 11/12/14 1520  INR 0.94 1.01    BMP:  Recent Labs  11/13/14 0133 11/14/14 0630 11/16/14 0825 11/17/14 0500  NA 134* 133* 139 132*  K 4.1 3.5 4.1 5.7*  CL 96 95* 105 94*  CO2 17*  GLUCOSE 374* 204* 145* 545*  BUN 50* 75* 65* 29*  CALCIUM 8.3* 8.3* 9.2 9.0  CREATININE 9.41* 11.90* 11.30* 6.74*  GFRNONAA 5* 4* 4* 7*  GFRAA 6* 4* 5* 9*    LIVER FUNCTION TESTS:  Recent Labs  09/25/14 1628  10/24/14 0945 10/28/14 0226 11/12/14 1942 11/14/14 0630  BILITOT 0.4  --  2.0*  --  0.9  --   AST 10  --  24  --  15  --   ALT 9  --  22  --  13  --   ALKPHOS 105  --  100  --  64  --   PROT 8.6*  --  8.6*  --  7.0  --   ALBUMIN 4.1  < > 4.4 3.0* 3.5 3.1*  < > = values in this interval not displayed.  Assessment and Plan: ESRD on HD TTS Right thigh AVG stenosis Scheduled today for shuntogram with possible angioplasty/stent or perm catheter placement.  Risks and Benefits discussed with the patient. All of the patient's questions were answered, patient is agreeable to proceed. Consent signed and in chart.    I spent a total of 15  minutes face to face in clinical consultation/evaluation, greater than 50% of which was counseling/coordinating care   Signed: Berneta LevinsMORGAN, Joscelyne Renville D 11/17/2014, 1:08 PM

## 2014-11-17 NOTE — Clinical Social Work Psychosocial (Signed)
Clinical Social Work Department BRIEF PSYCHOSOCIAL ASSESSMENT 11/17/2014  Patient:  [name],[name]     Account Number:  [id number]     Admit date:  11/16/2014  Clinical Social Worker:  Delmer IslamRAWFORD,Sireen Halk, LCSW  Date/Time:  11/17/2014 04:06 AM  Referred by:  Physician  Date Referred:  11/17/2014 Referred for  ALF Placement   Other Referral:   Interview type:  Patient Other interview type:   CSW also talked with Ms. [name], the person patient currently lives with.    PSYCHOSOCIAL DATA Living Status:  FRIEND(S) Admitted from facility:   Level of care:   Primary support name:  [name] Primary support relationship to patient:  FRIEND Degree of support available:   Patient has a girlfriend (partner) who lives in [place]. Her sister lives in [place], [place] and her father lives in [place], [place]. [name] is a friend who has taken patient in temporarily.  **Currently [name] is her primary support as she is providing housing for patient.    CURRENT CONCERNS Current Concerns  Post-Acute Placement   Other Concerns:    SOCIAL WORK ASSESSMENT / PLAN CSW and patient talked at length about her living situation. Patient unable to secure her own housing, although she has tried with several agencies and been advised that the waiting lists are closed for low-income/subsidized housing. [name] explained that her family (sister and father) would take her in, however she would not be able to get to dialysis. She would also like to move to [place] to live with her girlfriend, but cannot at this time. She has been living with friends and currently lives with [name].    [name] uses [employer] or [employer] cab service for her transportation needs. Per patient, she has applied and is over income for Medicaid, so cannot get Medicaid transportation. CSW and patient discussed possible housing options SNF vs ALF and she is agreeable to assisted living. She became frustrated when CSW mentioned  concern about the administration of her medication and CSW listened intently while patient explained her frustration with medical personnel, including nurses and doctors not listening or believing her and thinking that she does not know how to manage her illness, especially her diabetes. CSW thanked patient for being open and honest with the expression of her feelings.    CSW also spoke with Ms. Susan [name] [phone]) and she is concerned about patient and where she will stay, although she made it clear that patient cannot return to her home. [name] explained that she has found patient twice unconscious in her home and cannot continue to go through that. CSW listened actively and expressed understanding of Ms. [name]'s concerns.   Assessment/plan status:  Psychosocial Support/Ongoing Assessment of Needs Other assessment/ plan:   Information/referral to community resources:    PATIENT'S/FAMILY'S RESPONSE TO PLAN OF CARE: Patient is frustrated with her not being able to find housing and her medical issues and care. She is open to living in an assisted living facility and understands that she will need to apply for Medicaid.       [name], MSW, LCSW Licensed Clinical Social Worker Clinical Social Work Department [facility] [phone]

## 2014-11-17 NOTE — Progress Notes (Signed)
Was called to patient's room. Patient was lying in bed, crying, and mumbling. I was not able to understand what she was saying so I asked her if she could please repeat what she said. She then yelled loudly and very clearly using strong choice words. She also c/o weakness, sweating, CP. Obtained Vs and EKG. 97.7-83-20-178/87. RR was notified. MD notified. Will continue to monitor.

## 2014-11-17 NOTE — Progress Notes (Signed)
Subjective:  Episode of chest pain briefly last night, currently weak, very quiet, not feeling well, but nonspecific  Objective: Vital signs in last 24 hours: Temp:  [93.8 F (34.3 C)-99 F (37.2 C)] 99 F (37.2 C) (02/12 0410) Pulse Rate:  [62-91] 86 (02/12 0546) Resp:  [11-39] 17 (02/12 0410) BP: (108-178)/(59-90) 118/59 mmHg (02/12 0546) SpO2:  [98 %-100 %] 100 % (02/12 0410) Weight:  [59.7 kg (131 lb 9.8 oz)-63 kg (138 lb 14.2 oz)] 59.7 kg (131 lb 9.8 oz) (02/11 2015) Weight change:   Intake/Output from previous day: 02/11 0701 - 02/12 0700 In: -  Out: 3176 [Urine:200] Intake/Output this shift:   Lab Results:  Recent Labs  11/16/14 0825 11/17/14 0505  WBC 4.9 5.4  HGB 11.5* 13.9  HCT 34.2* 39.0  PLT 160 178   BMET:  Recent Labs  11/16/14 0825 11/17/14 0500  NA 139 132*  K 4.1 5.7*  CL 105 94*  CO2 19 17*  GLUCOSE 145* 545*  BUN 65* 29*  CREATININE 11.30* 6.74*  CALCIUM 9.2 9.0   No results for input(s): PTH in the last 72 hours. Iron Studies: No results for input(s): IRON, TIBC, TRANSFERRIN, FERRITIN in the last 72 hours.  Studies/Results: Dg Chest Portable 1 View  11/16/2014   CLINICAL DATA:  Low blood sugar.  EXAM: PORTABLE CHEST - 1 VIEW  COMPARISON:  11/12/2014  FINDINGS: Although borderline, no cardiomegaly when accounting for technique and mildly low lung volumes. Aortic and hilar contours are normal. No acute infiltrate or edema. No effusion or pneumothorax. No acute osseous findings.  IMPRESSION: Negative chest.   Electronically Signed   By: Marnee SpringJonathon  Watts M.D.   On: 11/16/2014 08:15   EXAM: General appearance:  Lethargic, eyes closed, but speaking very softly, inaudible Resp:  CTA without rales, rhonchi, or wheezes Cardio:  RRR without murmur or rub GI: + BS, soft and nontender Extremities:  No edema Access:  R thigh AVG with + bruit  Dialysis Orders: [facility] 4 hr 180 2K/2.25ca 60kgs 3000u hep 400/1.5  Aranesp 60 q week  hectorol 3 Q hd  Assessment/Plan: 1. Recurrent hypoglycemia & hypothermia - DM Type 1, glucose 507 this AM, BCs 2/11 with no growth (same as previous cultures), per primary. 2. Chest pain - brief episode last night; negative workup last admission, EKG & troponins negative last night. 3. ESRD - HD on TTS @ [facility], K 5.7.  Next scheduled HD tomorrow. 4. HTN/Volume - BP 143/85 on Amlodipine 10 mg qd, Lisinopril 20 bid, Carvedilol 25 mg qd; wt 59.7 kg s/p 3 L yesterday, @ [facility]. 5. Anemia - Hgb 11.5, Aranesp 60 mcg on Tues. 6. Sec HPT - Ca 9; Hectorol 3 mcg, Fosrenol 1 g with meals. 7. Nutrition - renal carb-mod diet, vitamin.   LOS: 1 day   LYLES,CHARLES 11/17/2014,8:36 AM  Pt seen, examined and agree w A/P as above. Cardiac enzymes negative but pt has CP , longstanding DM and recurrent problems with low or high BS w/o explanation. Would consider cardiac ischemia as cause of unexplained uncontrolled BS in this patient who usually is able to control her DM. Will d/w primary team. Vinson Moselleob Makinley Muscato MD pager [phone]    cell [phone] 11/17/2014, 10:44 AM

## 2014-11-17 NOTE — Progress Notes (Signed)
Family Medicine Teaching Service Daily Progress Note Intern Pager: [phone]  Patient name: [name] Medical record number: [id number] Date of birth: 06/01/1981 Age: 34 y.o. Gender: female  Primary Care Provider: Hazeline JunkerGrunz, Ryan, MD Consultants: nephrology  Code Status: FULL  Pt Overview and Major Events to Date:  2/12: HD today?   Assessment and Plan: Susan Rossettiameka Paino is a 34 y.o. female presenting with hyperglycemia and hypoglycemia. PMH is significant for type 1 diabetes, diabetic retinopathy/blind, frequent DKA, DVT of upper extremity, end-stage renal disease on HD, hypertension.  #Hypoglycemia/DM type I: Brittle diabetic history. Reported roommate heard seizure noises early this am, EMS called CBG 22, treated in route to ED. ED CBG 59->127. Pt states she has been compliant with medications and using as directed. CBG this am 264 - sensitive SSI, 24 total units of Novolog/24 hours. Lantus 8 units BID - Carb modified diet - hypoglycemia protocol.   #Hypothermia: Unknown cause of hypothermia, considering hypothyroidism vs infection. Patient temp on ED arrival 93.4. She was placed on warming blanket, current temperature 95.8. WBC 4.5. TSH slightly elevated to 5.35 last admission and now 14.61 in ED. Hypoglycemia from insulin overdose still most likely cause. T 98.61F - Continue synthroid 100mcg daily - Lactic acid normal - Blood cultures drawn in ED and vanc/zosyn given - no further abx at this time  #ESRD on HD- Receives dialysis on Tuesday, Thursday, Saturday. Last dialysis was Tuesday. Cr on admission 11.3, BUN 65. Cr 6.74. K 5.7.  HD today? - Consult Nephrology, appreciate recs  -It appears that patient scheduled for R femoral shuntogram, art and venous anast PTA from today's IR notes.  #Abdominal pain/constipation: CT on last admission positive for fluid overload, and fatty liver. WBC normal. Appears epigastric in nature. WBC within normal limits, troponin negative, white count normal.  Lactic acid normal. - Has had negative renal stone CT, KUB and abdominal U/S in last month. - Bowel regimen: Colace/Senna daily, suppository PRN  #HTN: BP 143/85 this am - Home medications include Amlodipine 10mg , Coreg 25mg  BID, and Lisinopril.  - Continue Amlodipine, Coreg, and Lisinopril   #Suicidal Ideation- Noted on a hospitalization prior. Not voicing SI but I question her capacity as she was sent home on a regimen that proved to work well for her during last admission and again is coming in with hypoglycemia 2/2 taking too much insulin.  - Continue to monitor throughout hospitalization - c/s Psych given poor insight to over dosing of insulin. ?Capacity.  Spoke to Dr Henrene HawkingJonnalaggada.  Likely will not be seen until tomorrow.  # Social: Patient reports that she is a "couch hopper".  She lists her sister's address in [place] as mailing but has no permanent address.  Per PT note, patient agreeable to assisted living facility. -c/s CSW.  They will work to see if there is placement for her available.  FEN/GI: Carb Modified diet Prophylaxis: Subcutaneous Heparin  Disposition: Discharge pending improvement in blood sugar, psych evaluation, possible placement in assisted living facility  Subjective:  Patient voices no concerns to me this afternoon. She was off to what I thought was HD but turns out to be a procedure.  Will reconvene with her this afternoon if possible.  Objective: Temp:  [93.8 F (34.3 C)-99 F (37.2 C)] 98.8 F (37.1 C) (02/12 0844) Pulse Rate:  [62-91] 80 (02/12 0844) Resp:  [11-39] 16 (02/12 0844) BP: (108-178)/(59-90) 143/85 mmHg (02/12 0844) SpO2:  [98 %-100 %] 100 % (02/12 0844) Weight:  [131 lb 9.8 oz (59.7  kg)-138 lb 14.2 oz (63 kg)] 131 lb 9.8 oz (59.7 kg) (02/11 2015) Physical Exam: General: awake, alert, well nourished, NAD, transporter at bedside Cardiovascular: RRR, no murmurs Respiratory: CTAB, no increased WOB Abdomen: soft, mild  generalized TTP, no peritoneal signs Extremities: WWP, no edema Psych: affect flat, speech normal, mood stable Neuro: blind at baseline, otherwise no focal deficits  Laboratory:  Recent Labs Lab 11/14/14 0630 11/16/14 0825 11/17/14 0505  WBC 4.5 4.9 5.4  HGB 10.4* 11.5* 13.9  HCT 29.5* 34.2* 39.0  PLT 161 160 178    Recent Labs Lab 11/12/14 1942  11/14/14 0630 11/16/14 0825 11/17/14 0500  NA 138  < > 133* 139 132*  K 3.9  < > 3.5 4.1 5.7*  CL 99  < > 95* 105 94*  CO2 24  < > 22 19 17*  BUN 40*  < > 75* 65* 29*  CREATININE 8.67*  < > 11.90* 11.30* 6.74*  CALCIUM 8.5  < > 8.3* 9.2 9.0  PROT 7.0  --   --   --   --   BILITOT 0.9  --   --   --   --   ALKPHOS 64  --   --   --   --   ALT 13  --   --   --   --   AST 15  --   --   --   --   GLUCOSE 412*  < > 204* 145* 545*  < > = values in this interval not displayed.   Imaging/Diagnostic Tests: Dg Chest Portable 1 View  11/16/2014   CLINICAL DATA:  Low blood sugar.  EXAM: PORTABLE CHEST - 1 VIEW  COMPARISON:  11/12/2014  FINDINGS: Although borderline, no cardiomegaly when accounting for technique and mildly low lung volumes. Aortic and hilar contours are normal. No acute infiltrate or edema. No effusion or pneumothorax. No acute osseous findings.  IMPRESSION: Negative chest.   Electronically Signed   By: Marnee Spring M.D.   On: 11/16/2014 08:15    Raliegh Ip, DO 11/17/2014, 8:52 AM PGY-1, [facility] FPTS Intern pager: [phone], text pages welcome

## 2014-11-17 NOTE — Progress Notes (Signed)
Inpatient Diabetes Program Recommendations  AACE/ADA: New Consensus Statement on Inpatient Glycemic Control (2013)  Target Ranges:  Prepandial:   less than 140 mg/dL      Peak postprandial:   less than 180 mg/dL (1-2 hours)      Critically ill patients:  140 - 180 mg/dL   Reason for Visit: Referral received.   Results for Donnamarie RossettiMONROE, Samara (MRN [id number]) as of 11/17/2014 15:57  Ref. Range 10/25/2014 09:30  Hemoglobin A1C Latest Range: <5.7 % 6.8 (H)  Diabetes history: Type 1 diabetes since age 34 Outpatient Diabetes medications: Lantus 8 units bid, Novolog 7 units tid with meals, and Novolog correction 1 units for every 50 mg/dL greater than 409150 mg/dL.  Current orders for Inpatient glycemic control:  Lantus 8 units bid, Novolog sensitive tid with meals and HS  Spoke at length with patient regarding diabetes management.  She states " I know that this is not my diabetes causing my problems".  She states that she is compliant with taking insulin at home and that her A1C usually ranges between 6.5-7.3%.  States that she has had some lows recently including one which she seized.  I asked if she had Glucagon at home and she states no b/c she has not had good success with Glucagon in the past and that people "get nervous with it".  Discussed possible reasons for lows and she states that she does not know.  She was very concerned that her CBG went up to 500's this morning and states "I did not get my Lantus last night".  She states that the "doctor's think that I am not doing the right thing with my insulin/diabetes but I am".  Support given.  States that she listens to "clicks" of insulin pens and is able to care for her diabetes independently at home.  Also [facility].  Patient seemed glad that I visited and was able to vent.    May consider adding Novolog meal coverage 3 units tid with meals while patient is in the hospital.  Beryl MeagerJenny Endora Teresi, RN,  BC-ADM Inpatient Diabetes Coordinator Pager [phone]

## 2014-11-17 NOTE — Evaluation (Addendum)
Physical Therapy Evaluation and Discharge Patient Details Name: [name] MRN: [id number] DOB: 09/23/1981 Today's Date: 11/17/2014   History of Present Illness  Pt is a 34 y.o. female presenting with hypoglycemia with hypothermia. PMH is significant for type 1 diabetes, diabetic retinopathy/blind, frequent DKA, DVT of upper extremity, end-stage renal disease on HD, hypertension.  Clinical Impression  Patient evaluated by Physical Therapy with no further acute PT needs identified. All education has been completed and the patient has no further questions. At the time of PT eval pt was able to ambulate 450' with elbow guide and no LOB. Pt states she feels very close to baseline and does not wish for PT to return while inpatient. Pt reports that if it were financially feasible she would be interested in tranisitioning to an assisted living facility. Per chart review she may have family/friends willing to fund this. See below for any follow-up Physial Therapy or equipment needs. PT is signing off. Thank you for this referral.     Follow Up Recommendations No PT follow up    Equipment Recommendations  None recommended by PT    Recommendations for Other Services       Precautions / Restrictions Precautions Precautions: Fall Precaution Comments: Pt is essentially completely blind. No vision in L eye and has minimal vision in R eye. Pt states that she cannot see shadows but occasionally will see light in the R.  Restrictions Weight Bearing Restrictions: No      Mobility  Bed Mobility Overal bed mobility: Modified Independent Bed Mobility: Supine to Sit           General bed mobility comments: No assist to transition to EOB.   Transfers Overall transfer level: Modified independent Equipment used: None Transfers: Sit to/from Stand           General transfer comment: Pt able to power-up to full standing without assist or LOB.   Ambulation/Gait Ambulation/Gait assistance:  Modified independent (Device/Increase time) Ambulation Distance (Feet): 450 Feet Assistive device: 1 person hand held assist (Guiding with elbow) Gait Pattern/deviations: WFL(Within Functional Limits)     General Gait Details: Therapist may have slowed down gait speed due to guiding pt. When asked, pt states she can walk faster. Pt was able to negotiate obstacles and people in the hall well with elbow guide. No LOB noted and pt did not report any fatigue. O2 sats on RA were 98% throughout session.   Stairs Stairs:  (Pt declines stair training. States she has no difficulty. )          Wheelchair Mobility    Modified Rankin (Stroke Patients Only)       Balance Overall balance assessment: Modified Independent                                           Pertinent Vitals/Pain Pain Assessment: No/denies pain    Home Living Family/patient expects to be discharged to:: Private residence Living Arrangements: Non-relatives/Friends Available Help at Discharge: Friend(s);Available PRN/intermittently Type of Home: House Home Access: Stairs to enter Entrance Stairs-Rails: Right Entrance Stairs-Number of Steps: 4-5 Home Layout: One level Home Equipment: Cane - single point (blind cane)      Prior Function Level of Independence: Independent with assistive device(s)         Comments: Pt states she is independent with all mobility, and only uses a guide when she is  somewhere new. Pt states she grocery shops and performs all ADL's independently.      Hand Dominance   Dominant Hand: Right    Extremity/Trunk Assessment   Upper Extremity Assessment: Defer to OT evaluation           Lower Extremity Assessment: Overall WFL for tasks assessed      Cervical / Trunk Assessment: Normal  Communication   Communication: No difficulties  Cognition Arousal/Alertness: Awake/alert Behavior During Therapy: Flat affect Overall Cognitive Status: Within Functional  Limits for tasks assessed                      General Comments      Exercises        Assessment/Plan    PT Assessment Patent does not need any further PT services  PT Diagnosis Generalized weakness   PT Problem List    PT Treatment Interventions     PT Goals (Current goals can be found in the Care Plan section) Acute Rehab PT Goals PT Goal Formulation: All assessment and education complete, DC therapy    Frequency     Barriers to discharge        Co-evaluation               End of Session   Activity Tolerance: Patient tolerated treatment well Patient left: in chair;with call bell/phone within reach;with family/visitor present Nurse Communication: Mobility status         Time: 1610-9604 PT Time Calculation (min) (ACUTE ONLY): 23 min   Charges:   PT Evaluation $Initial PT Evaluation Tier I: 1 Procedure PT Treatments $Gait Training: 8-22 mins   PT G Codes:        Conni Slipper 2014/11/26, 10:27 AM  Conni Slipper, PT, DPT Acute Rehabilitation Services Pager: [phone]

## 2014-11-17 NOTE — Clinical Social Work Placement (Addendum)
Clinical Social Work Department CLINICAL SOCIAL WORK PLACEMENT NOTE 11/17/2014  Patient:  Susan RossettiMONROE,Susan  Account Number:  [id number] Admit date:  11/16/2014  Clinical Social Worker:  Genelle BalVANESSA Heinrich Fertig, LCSW  Date/time:  11/17/2014 12:00 M  Clinical Social Work is seeking post-discharge placement for this patient at the following level of care:   ASSISTED LIVING/REST HOME   (*CSW will update this form in Epic as items are completed)     Patient/family provided with [facility] Department of Clinical Social Work's list of facilities offering this level of care within the geographic area requested by the patient (or if unable, by the patient's family).  11/17/2014  Patient/family informed of their freedom to choose among providers that offer the needed level of care, that participate in Medicare, Medicaid or managed care program needed by the patient, have an available bed and are willing to accept the patient.    Patient/family informed of [facility]' ownership interest in [facility], as well as of the fact that they are under no obligation to receive care at this facility.  PASARR submitted to EDS on 11/17/2014 - Patient Level II PASARR PASARR number received on   FL2 transmitted to all facilities in geographic area requested by pt/family on  11/17/2014 FL2 transmitted to all facilities within larger geographic area on   Patient informed that his/her managed care company has contracts with or will negotiate with  certain facilities, including the following:     Patient/family informed of bed offers received:   Patient chooses bed at  Physician recommends and patient chooses bed at    Patient to be transferred to  on   Patient to be transferred to facility by  Patient and family notified of transfer on  Name of family member notified:    The following physician request were entered in Epic:  Additional Comments:    Genelle BalVanessa [name], MSW, LCSW Licensed Clinical  Social Worker Clinical Social Work Department [facility] [phone]

## 2014-11-17 NOTE — Progress Notes (Signed)
PCP Note   As patient's PCP I stopped by to visit Janet prior to discharge. She has an unfortunate combination of worsening and very brittle diabetes in addition to an adverse social situation with poor social supports. The maximum benefit of hospitalization has been reached from a medical perspective. She is being discharged back to tumultuous social situation, though I believe she would greatly benefit from disposition to ALF. I appreciate the efforts of all the staff and medical team involved in her care. We have a hospital follow up appointment scheduled for Feb 26.   Ritchard Paragas B. Jarvis NewcomerGrunz, MD, PGY-2 11/17/2014 5:53 PM Pager: [phone]

## 2014-11-18 DIAGNOSIS — Z5900 Homelessness unspecified: Secondary | ICD-10-CM | POA: Insufficient documentation

## 2014-11-18 DIAGNOSIS — F4320 Adjustment disorder, unspecified: Secondary | ICD-10-CM

## 2014-11-18 LAB — RENAL FUNCTION PANEL
Albumin: 3.2 g/dL — ABNORMAL LOW (ref 3.5–5.2)
Anion gap: 12 (ref 5–15)
BUN: 45 mg/dL — ABNORMAL HIGH (ref 6–23)
CO2: 26 mmol/L (ref 19–32)
Calcium: 8.7 mg/dL (ref 8.4–10.5)
Chloride: 96 mmol/L (ref 96–112)
Creatinine, Ser: 10.01 mg/dL — ABNORMAL HIGH (ref 0.50–1.10)
GFR calc Af Amer: 5 mL/min — ABNORMAL LOW (ref >90)
GFR calc non Af Amer: 5 mL/min — ABNORMAL LOW (ref >90)
Glucose, Bld: 209 mg/dL — ABNORMAL HIGH (ref 70–99)
Phosphorus: 6.8 mg/dL — ABNORMAL HIGH (ref 2.3–4.6)
Potassium: 4.2 mmol/L (ref 3.5–5.1)
Sodium: 134 mmol/L — ABNORMAL LOW (ref 135–145)

## 2014-11-18 LAB — CBC
HCT: 29.5 % — ABNORMAL LOW (ref 36.0–46.0)
HCT: 35.9 % — ABNORMAL LOW (ref 36.0–46.0)
Hemoglobin: 10.2 g/dL — ABNORMAL LOW (ref 12.0–15.0)
Hemoglobin: 12.4 g/dL (ref 12.0–15.0)
MCH: 31.5 pg (ref 26.0–34.0)
MCH: 32 pg (ref 26.0–34.0)
MCHC: 34.6 g/dL (ref 30.0–36.0)
MCHC: 34.5 g/dL (ref 30.0–36.0)
MCV: 91 fL (ref 78.0–100.0)
MCV: 92.8 fL (ref 78.0–100.0)
PLATELETS: 222 10*3/uL (ref 150–400)
Platelets: 188 10*3/uL (ref 150–400)
RBC: 3.24 MIL/uL — ABNORMAL LOW (ref 3.87–5.11)
RBC: 3.87 MIL/uL (ref 3.87–5.11)
RDW: 13.6 % (ref 11.5–15.5)
RDW: 13.9 % (ref 11.5–15.5)
WBC: 4.7 10*3/uL (ref 4.0–10.5)
WBC: 4.5 10*3/uL (ref 4.0–10.5)

## 2014-11-18 LAB — GLUCOSE, CAPILLARY
Glucose-Capillary: 137 mg/dL — ABNORMAL HIGH (ref 70–99)
Glucose-Capillary: 238 mg/dL — ABNORMAL HIGH (ref 70–99)
Glucose-Capillary: 235 mg/dL — ABNORMAL HIGH (ref 70–99)

## 2014-11-18 LAB — TROPONIN I
Troponin I: 0.03 ng/mL (ref <0.031)
Troponin I: <0.03 ng/mL (ref <0.031)
Troponin I: <0.03 ng/mL (ref <0.031)

## 2014-11-18 MED ORDER — LIDOCAINE HCL (PF) 1 % IJ SOLN: 5.0000 mL | INTRAMUSCULAR | Status: DC | PRN

## 2014-11-18 MED ORDER — HEPARIN SODIUM (PORCINE) 1000 UNIT/ML DIALYSIS
1000.0000 [IU] | INTRAMUSCULAR | Status: DC | PRN
  Filled 2014-11-18: qty 1

## 2014-11-18 MED ORDER — INSULIN GLARGINE 100 UNIT/ML ~~LOC~~ SOLN
9.0000 [IU] | Freq: Two times a day (BID) | SUBCUTANEOUS | Status: DC
  Administered 2014-11-18 – 2014-11-19 (×2): 9 [IU] via SUBCUTANEOUS
  Filled 2014-11-18 (×4): qty 0.09

## 2014-11-18 MED ORDER — SODIUM CHLORIDE 0.9 % IV SOLN: 100.0000 mL | INTRAVENOUS | Status: DC | PRN

## 2014-11-18 MED ORDER — DOXERCALCIFEROL 4 MCG/2ML IV SOLN
INTRAVENOUS | Status: AC
  Filled 2014-11-18: qty 2

## 2014-11-18 MED ORDER — HEPARIN SODIUM (PORCINE) 1000 UNIT/ML DIALYSIS
3000.0000 [IU] | INTRAMUSCULAR | Status: DC | PRN
  Filled 2014-11-18: qty 3

## 2014-11-18 MED ORDER — INSULIN ASPART 100 UNIT/ML ~~LOC~~ SOLN
1.0000 [IU] | Freq: Three times a day (TID) | SUBCUTANEOUS | Status: DC
  Administered 2014-11-19 – 2014-11-20 (×5): 1 [IU] via SUBCUTANEOUS

## 2014-11-18 MED ORDER — NEPRO/CARBSTEADY PO LIQD: 237.0000 mL | ORAL | Status: DC | PRN

## 2014-11-18 MED ORDER — ALTEPLASE 2 MG IJ SOLR
2.0000 mg | Freq: Once | INTRAMUSCULAR | Status: DC | PRN
  Filled 2014-11-18: qty 2

## 2014-11-18 MED ORDER — PENTAFLUOROPROP-TETRAFLUOROETH EX AERO: 1.0000 "application " | INHALATION_SPRAY | CUTANEOUS | Status: DC | PRN

## 2014-11-18 MED ORDER — LIDOCAINE-PRILOCAINE 2.5-2.5 % EX CREA: 1.0000 "application " | TOPICAL_CREAM | CUTANEOUS | Status: DC | PRN

## 2014-11-18 NOTE — Consult Note (Signed)
[facility] Face-to-Face Psychiatry Consult   Reason for Consult:  Capacity and possible suicide ideation Referring Physician:  Family Practice resident  Patient Identification: [name] MRN:  [id number]   Principal Diagnosis: Adjustment disorder  Diagnosis:   Patient Active Problem List   Diagnosis Date Noted  . Type 1 diabetes mellitus with diabetic chronic kidney disease [E10.22, N18.9]   . CKD (chronic kidney disease) requiring chronic dialysis [N18.6, Z99.2]   . Blindness due to type 1 diabetes mellitus [E10.39, H54.0]   . 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 Hypoglycemia [E16.2] 11/12/2014  . Altered mental status [R41.82]   . Epigastric abdominal pain [R10.13]   . Hypothermia [T68.XXXA]   . Constipation [K59.00]   . Abdominal pain [R10.9]   . Hyponatremia [E87.1]   . Nausea with vomiting [R11.2]   . Type 1 diabetes mellitus with complications [E10.8]   . Hypertensive urgency [I10]   . ESRD on dialysis [N18.6, Z99.2]   . Hyperkalemia [E87.5]   . Hyperglycemia [R73.9]   . Abdominal pain, acute [R10.0]   . Diabetic ketoacidosis without coma associated with diabetes mellitus due to underlying condition [E08.10]   . DKA (diabetic ketoacidoses) [E13.10] 10/24/2014  . Major depressive disorder, recurrent, severe without psychotic features [F33.2]   . ESRD (end stage renal disease) [N18.6] 09/27/2014  . Hip pain [M25.559]   . Recurrent major depression-severe [F33.2] 09/26/2014  . Suicidal behavior [F48.9] 09/25/2014  . Suicidal ideation [R45.851] 09/25/2014  . Peripheral polyneuropathy [G62.9] 09/19/2014  . HTN (hypertension), malignant [I10] 06/29/2014  . Abnormal EKG [R94.31] 06/29/2014  . Chest pain with moderate risk of acute coronary syndrome [R07.9] 06/28/2014  . Atypical chest pain [R07.89] 06/28/2014  . ESRD (end stage renal disease) on dialysis [N18.6, Z99.2] 11/11/2013  . Hypothyroidism [E03.9] 06/10/2013  . Diabetic retinopathy-Blind  [E11.319] 04/21/2013  . Essential hypertension, benign [I10] 04/21/2013  . DVT of upper extremity (deep vein thrombosis) [I82.629] 04/02/2013  . DM (diabetes mellitus), type 1 [E10.9] 04/02/2013    Total Time spent with patient: 45 minutes  Subjective:   [name] is a 34 y.o. female patient admitted with hypoglycemia and hypothermia.  HPI: [name] is a 34 y.o. female seen, chart reviewed and case discussed with face-to-face with the patient patient sister and brother who were at bedside. Patient reported she has type 1 diabetes, become legally blindness about 7 years ago and developed chronic renal failure about a year and half ago. Patient has intact cognition, memory, concentration and language functions. Patient has completed awareness about her current medical and mental health condition and needed and appropriate medication treatment and hemodialysis. Patient reportedly has been compliant with her treatment as prescribed to her. Patient stated she is a highly intellectual, independent in her living and caring for herself and does not want to depend on anybody. Patient takes offense when suggested possible making mistakes in her  insulin treatment saying do not insert because some legally blind. Patient Brother and sister seems to be supporting to her. Patient was upset and frustrated because psychiatric consultation was called in. Patient is very clearly stated that she has no depression, anxiety, psychosis, suicidal/homicidal ideation, intention or plan. Patient is also elevated mental health services are available if she ever needed treatment. Patient wants cognitive behavior therapy as a first option than her medications which may cause unknown side effects. Patient knows that she has been recently hospitalized multiple times but not able to figure it out why she has been  getting har blood sugars out of whack. She believes her immune system has been acting out. She has no previous  history of mental health treatment either inpatient or outpatient. Patient refused antidepressant medication when offered by nephrologist about one and half year ago, states that non-mental health physicians are providing mental health medication.    Medical history: Patient is presenting with hypoglycemia and hypothermia, roommate heard strange noises early this am. EMS was called and CBG was 22, pt was given D50 and she reports she then became more alert but still felt somewhat lethargic. Upon arrival to ED CBG was 59 and she was given another amp after which CBG was 127. Pt was hypothermic with temperature of 93.4. Warming blanket was applied and her temperature steadily rose to 95.8. Pt states she is uncertain of anything that happened after she went to bed last night. The next thing she remembers is EMS at her home. Pt reports compliance with insulin regimen. She reports that when she awoke yesterday morning her glucose was >500 but she gradually got it down over the day and when she went to bed it was 87 so she ate a snack. Patient states she thinks that her recent problems are all coming from "digestive issues ". She has frequent constipation, having a bowel movement 1-2 times a week.   HPI Elements:   Location:  Adjustment disorder. Quality:  Fair. Severity:  Chronic. Timing:  Hypoglycemic event. Duration:  Few days to few weeksays. Context:  Legally blind and self treatment with insulin for type 1 diabetes mellitus.  Past Medical History:  Past Medical History  Diagnosis Date  . 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 Hypothyroidism   . Hypertension   . Type I diabetes mellitus   . DVT (deep venous thrombosis)     "both arms" (11/11/2013)  . High cholesterol   . Diabetic retinopathy associated with type 1 diabetes mellitus   . Heart murmur     "slight" (11/11/2013)  . Graves' disease     "had thyroid radioactively treated" (11/11/2013)  . End-stage renal disease on  hemodialysis     "TTS; [place] [facility]" (11/11/2013)    Past Surgical History  Procedure Laterality Date  . Insertion of dialysis catheter N/A 04/01/2013    Procedure: INSERTION OF DIALYSIS CATHETER;  Surgeon: Pryor Ochoa, MD;  Location: [facility] OR;  Service: Vascular;  Laterality: N/A;  . Av fistula placement Right 04/07/2013    Procedure: INSERTION OF ARTERIOVENOUS (AV) GORE-TEX GRAFT THIGH;  Surgeon: Chuck Hint, MD;  Location: [facility] OR;  Service: Vascular;  Laterality: Right;  . Cataract extraction w/ intraocular lens implant Right 2009  . Eye surgery Right 2009    "laser OR for diabetic retinopathy" (05/23/2013)  . Enucleation Left ~ 2010  . Revision of arteriovenous goretex graft Right 11/11/2013    Procedure: REVISION OF ARTERIOVENOUS GORETEX GRAFT - RIGHT THIGH;  Surgeon: Chuck Hint, MD;  Location: [facility] OR;  Service: Vascular;  Laterality: Right;  [name] Bilateral 04/04/2013    Procedure: VENOGRAM;  Surgeon: Chuck Hint, MD;  Location: [facility] CATH LAB;  Service: Cardiovascular;  Laterality: Bilateral;   Family History:  Family History  Problem Relation Age of Onset  . Hypertension    . Cancer    . Thyroid disease    . Cancer Mother   . Asthma Father    Social History:  History  Alcohol Use No  History  Drug Use No    History   Social History  . Marital Status: Unknown    Spouse Name: N/A  . Number of Children: N/A  . Years of Education: N/A   Social History Main Topics  . Smoking status: Never Smoker   . Smokeless tobacco: Never Used  . Alcohol Use: No  . Drug Use: No  . Sexual Activity: Yes    Birth Control/ Protection: None     Comment: not with a man   Other Topics Concern  . None   Social History Narrative   Additional Social History:         Allergies:  No Known Allergies  Vitals: Blood pressure 158/86, pulse 78, temperature 98 F (36.7 C), temperature source Oral, resp. rate 12, height 5\' 3"  (1.6 m), weight 59.2 kg (130 lb 8.2  oz), last menstrual period 10/16/2014, SpO2 98 %.  Risk to Self: Is patient at risk for suicide?: No Risk to Others:   Prior Inpatient Therapy:   Prior Outpatient Therapy:    Current Facility-Administered Medications  Medication Dose Route Frequency Provider Last Rate Last Dose  . acetaminophen (TYLENOL) tablet 650 mg  650 mg Oral Q6H PRN Abram SanderElena M Adamo, MD   650 mg at 11/17/14 0850   Or  . acetaminophen (TYLENOL) suppository 650 mg  650 mg Rectal Q6H PRN Abram SanderElena M Adamo, MD      . amLODipine (NORVASC) tablet 10 mg  10 mg Oral BID Abram SanderElena M Adamo, MD   10 mg at 11/18/14 1413  . aspirin chewable tablet 81 mg  81 mg Oral Daily Abram SanderElena M Adamo, MD   81 mg at 11/18/14 1417  . carvedilol (COREG) tablet 25 mg  25 mg Oral q morning - 10a Abram SanderElena M Adamo, MD   25 mg at 11/18/14 1412  . docusate sodium (COLACE) capsule 100 mg  100 mg Oral BID Abram SanderElena M Adamo, MD   100 mg at 11/18/14 1412  . doxercalciferol (HECTOROL) 4 MCG/2ML injection           . heparin injection 5,000 Units  5,000 Units Subcutaneous 3 times per day Abram SanderElena M Adamo, MD   5,000 Units at 11/18/14 1414  . insulin aspart (novoLOG) injection 0-5 Units  0-5 Units Subcutaneous QHS Abram SanderElena M Adamo, MD   5 Units at 11/17/14 2123  . insulin aspart (novoLOG) injection 0-9 Units  0-9 Units Subcutaneous TID WC Abram SanderElena M Adamo, MD   1 Units at 11/18/14 1417  . insulin glargine (LANTUS) injection 8 Units  8 Units Subcutaneous BID Raliegh IpAshly M Gottschalk, DO   8 Units at 11/18/14 1414  . lanthanum (FOSRENOL) chewable tablet 1,000 mg  1,000 mg Oral TID WC Abram SanderElena M Adamo, MD   1,000 mg at 11/18/14 1413  . levothyroxine (SYNTHROID, LEVOTHROID) tablet 100 mcg  100 mcg Oral QAC breakfast Abram SanderElena M Adamo, MD   100 mcg at 11/18/14 1412  . lisinopril (PRINIVIL,ZESTRIL) tablet 20 mg  20 mg Oral BID Abram SanderElena M Adamo, MD   20 mg at 11/18/14 1414  . multivitamin (RENA-VIT) tablet 1 tablet  1 tablet Oral QHS Abram SanderElena M Adamo, MD   1 tablet at 11/17/14 2122  . pantoprazole (PROTONIX) EC  tablet 40 mg  40 mg Oral Daily Abram SanderElena M Adamo, MD   40 mg at 11/18/14 1417  . senna (SENOKOT) tablet 8.6 mg  1 tablet Oral BID Abram SanderElena M Adamo, MD   8.6 mg at 11/18/14 1411  Musculoskeletal: Strength & Muscle Tone: within normal limits Gait & Station: unable to stand Patient leans: N/A  Psychiatric Specialty Exam: Physical Exam as per history and physical   ROS legally blind, weakness secondary to hypoglycemia does not want anybody to put her down because of her health problems.   Blood pressure 158/86, pulse 78, temperature 98 F (36.7 C), temperature source Oral, resp. rate 12, height 5\' 3"  (1.6 m), weight 59.2 kg (130 lb 8.2 oz), last menstrual period 10/16/2014, SpO2 98 %.Body mass index is 23.13 kg/(m^2).  General Appearance: Casual  Eye Contact::  Reports totally blind, legally blind   Speech:  Clear and Coherent  Volume:  Normal  Mood:  Angry  Affect:  Appropriate and Congruent  Thought Process:  Coherent and Goal Directed  Orientation:  Full (Time, Place, and Person)  Thought Content:  WDL  Suicidal Thoughts:  No  Homicidal Thoughts:  No  Memory:  Immediate;   Good Recent;   Good Remote;   Good  Judgement:  Intact  Insight:  Good  Psychomotor Activity:  Normal  Concentration:  Good  Recall:  Good  Fund of Knowledge:Good  Language: Good  Akathisia:  NA  Handed:  Right  AIMS (if indicated):     Assets:  Communication Skills Desire for Improvement Financial Resources/Insurance Housing Intimacy Leisure Time Resilience Social Support Talents/Skills Transportation  ADL's:  Intact  Cognition: WNL  Sleep:      Medical Decision Making: Review of Psycho-Social Stressors (1), Review or order clinical lab tests (1), Established Problem, Worsening (2), Review or order medicine tests (1), Review of Medication Regimen & Side Effects (2) and Review of New Medication or Change in Dosage (2)  Treatment Plan Summary: Daily contact with patient to assess and evaluate  symptoms and progress in treatment and Medication management  Plan:  Patient has capacity to make her own medical decisions and living arrangements based on my evaluation today  Patient does not meet criteria for psychiatric inpatient admission. Supportive therapy provided about ongoing stressors.   Disposion: Patient may be discharged to home when medically stable with appropriate disposition plans.   Karem Tomaso,JANARDHAHA R. 11/18/2014 2:30 PM

## 2014-11-18 NOTE — Progress Notes (Signed)
Subjective:  Seen on dialysis, no complaints  Objective: Vital signs in last 24 hours: Temp:  [98.2 F (36.8 C)-99.4 F (37.4 C)] 98.2 F (36.8 C) (02/13 0729) Pulse Rate:  [63-80] 69 (02/13 0729) Resp:  [14-18] 14 (02/13 0729) BP: (130-149)/(74-85) 141/78 mmHg (02/13 0729) SpO2:  [98 %-100 %] 99 % (02/13 0729) Weight:  [60.328 kg (133 lb)-61.7 kg (136 lb 0.4 oz)] 61.7 kg (136 lb 0.4 oz) (02/13 0729) Weight change: 1.361 kg (3 lb)  Intake/Output from previous day: 02/12 0701 - 02/13 0700 In: 360 [P.O.:360] Out: 450 [Urine:450] Intake/Output this shift:   Lab Results:  Recent Labs  11/17/14 0505 11/17/14 2259  WBC 5.4 5.0  HGB 13.9 10.9*  HCT 39.0 33.0*  PLT 178 188   BMET:  Recent Labs  11/17/14 0500 11/18/14 0505  NA 132* 134*  K 5.7* 4.2  CL 94* 96  CO2 17* 26  GLUCOSE 545* 209*  BUN 29* 45*  CREATININE 6.74* 10.01*  CALCIUM 9.0 8.7  ALBUMIN  --  3.2*   No results for input(s): PTH in the last 72 hours. Iron Studies: No results for input(s): IRON, TIBC, TRANSFERRIN, FERRITIN in the last 72 hours.  Studies/Results: Ir Pta Venous Right  11/17/2014   CLINICAL DATA:  End stage renal disease, decreased flows during hemodialysis  FLUOROSCOPY TIME:  2 minutes 18 seconds  COMPARISON:  None available  EXAM: DIALYSIS SHUNTOGRAM  VENOUS ANGIOPLASTY  ARTERIAL ANGIOPLASTY  TECHNIQUE: The procedure, risks (including but not limited to bleeding, infection, organ damage ), benefits, and alternatives were explained to the patient. Questions regarding the procedure were encouraged and answered. The patient understands and consents to the procedure.  An 18-gauge angiocatheter was placed under real-time ultrasound guidance antegrade into the arterial limb of the patient's right femoralAV hemodialysis graft for dialysis fistulography. The angiocatheter and surrounding skin were then prepped with Betadine, draped in usual sterile fashion, infiltrated locally with 1% lidocaine. The  angiocatheter was exchanged over a Benson wire for a 6 [place] vascular sheath, through which a 7 mm x 4 cm Conquest angioplasty balloon was advanced to the level of a venous anastomoticstenosis for venous angioplasty using 60 second overlapping inflations at 30 atmospheres.  In similar fashion, the arterial limb of the graft was accessed retrograde with a micropuncture set, exchanged over a Bentson wire for a short 6 French sheath through which a 5 mm x 2 cm Mustang angioplasty balloon was advanced across an arterial anastomotic stenosis for angioplasty of 24 atmospheres. After follow-up venography, the catheter, sheath, and guidewire were removed and hemostasis achieved with a 2-0 Ethilon purse-string suture. No immediate complication.  FINDINGS: There is a short segment high-grade stenosis at the venous arterial anastomosis. The graft is patent. There is a high-grade short-segment stenosis of the venous anastomosis. Native venous outflow through the IVC is widely patent.  The arterial anastomotic stenosis responded readily to 5 mm balloon angioplasty. Follow-up should no significant residual or recurrent stenosis, extravasation, dissection, or other complication.  The venous anastomotic stenosis responded readily to 7 mm balloon angioplasty. Follow-up venography showed no significant residual or recurrent stenosis, extravasation, dissection, or other complication.  IMPRESSION: 1. Tandem arterial and venous anastomotic stenoses, with good response to 5 and 7 mm balloon angioplasty respectively.  ACCESS: Remains approachable for percutaneous intervention as needed.   Electronically Signed   By: Corlis Leak M.D.   On: 11/17/2014 13:51   Ir Pta Venous Right  11/17/2014   CLINICAL DATA:  End  stage renal disease, decreased flows during hemodialysis  FLUOROSCOPY TIME:  2 minutes 18 seconds  COMPARISON:  None available  EXAM: DIALYSIS SHUNTOGRAM  VENOUS ANGIOPLASTY  ARTERIAL ANGIOPLASTY  TECHNIQUE: The procedure, risks  (including but not limited to bleeding, infection, organ damage ), benefits, and alternatives were explained to the patient. Questions regarding the procedure were encouraged and answered. The patient understands and consents to the procedure.  An 18-gauge angiocatheter was placed under real-time ultrasound guidance antegrade into the arterial limb of the patient's right femoralAV hemodialysis graft for dialysis fistulography. The angiocatheter and surrounding skin were then prepped with Betadine, draped in usual sterile fashion, infiltrated locally with 1% lidocaine. The angiocatheter was exchanged over a Benson wire for a 6 [place] vascular sheath, through which a 7 mm x 4 cm Conquest angioplasty balloon was advanced to the level of a venous anastomoticstenosis for venous angioplasty using 60 second overlapping inflations at 30 atmospheres.  In similar fashion, the arterial limb of the graft was accessed retrograde with a micropuncture set, exchanged over a Bentson wire for a short 6 French sheath through which a 5 mm x 2 cm Mustang angioplasty balloon was advanced across an arterial anastomotic stenosis for angioplasty of 24 atmospheres. After follow-up venography, the catheter, sheath, and guidewire were removed and hemostasis achieved with a 2-0 Ethilon purse-string suture. No immediate complication.  FINDINGS: There is a short segment high-grade stenosis at the venous arterial anastomosis. The graft is patent. There is a high-grade short-segment stenosis of the venous anastomosis. Native venous outflow through the IVC is widely patent.  The arterial anastomotic stenosis responded readily to 5 mm balloon angioplasty. Follow-up should no significant residual or recurrent stenosis, extravasation, dissection, or other complication.  The venous anastomotic stenosis responded readily to 7 mm balloon angioplasty. Follow-up venography showed no significant residual or recurrent stenosis, extravasation, dissection, or  other complication.  IMPRESSION: 1. Tandem arterial and venous anastomotic stenoses, with good response to 5 and 7 mm balloon angioplasty respectively.  ACCESS: Remains approachable for percutaneous intervention as needed.   Electronically Signed   By: Corlis Leak M.D.   On: 11/17/2014 13:51   Ir Angio Av Shunt Addl Access  11/17/2014   CLINICAL DATA:  End stage renal disease, decreased flows during hemodialysis  FLUOROSCOPY TIME:  2 minutes 18 seconds  COMPARISON:  None available  EXAM: DIALYSIS SHUNTOGRAM  VENOUS ANGIOPLASTY  ARTERIAL ANGIOPLASTY  TECHNIQUE: The procedure, risks (including but not limited to bleeding, infection, organ damage ), benefits, and alternatives were explained to the patient. Questions regarding the procedure were encouraged and answered. The patient understands and consents to the procedure.  An 18-gauge angiocatheter was placed under real-time ultrasound guidance antegrade into the arterial limb of the patient's right femoralAV hemodialysis graft for dialysis fistulography. The angiocatheter and surrounding skin were then prepped with Betadine, draped in usual sterile fashion, infiltrated locally with 1% lidocaine. The angiocatheter was exchanged over a Benson wire for a 6 [place] vascular sheath, through which a 7 mm x 4 cm Conquest angioplasty balloon was advanced to the level of a venous anastomoticstenosis for venous angioplasty using 60 second overlapping inflations at 30 atmospheres.  In similar fashion, the arterial limb of the graft was accessed retrograde with a micropuncture set, exchanged over a Bentson wire for a short 6 French sheath through which a 5 mm x 2 cm Mustang angioplasty balloon was advanced across an arterial anastomotic stenosis for angioplasty of 24 atmospheres. After follow-up venography, the catheter, sheath, and  guidewire were removed and hemostasis achieved with a 2-0 Ethilon purse-string suture. No immediate complication.  FINDINGS: There is a short  segment high-grade stenosis at the venous arterial anastomosis. The graft is patent. There is a high-grade short-segment stenosis of the venous anastomosis. Native venous outflow through the IVC is widely patent.  The arterial anastomotic stenosis responded readily to 5 mm balloon angioplasty. Follow-up should no significant residual or recurrent stenosis, extravasation, dissection, or other complication.  The venous anastomotic stenosis responded readily to 7 mm balloon angioplasty. Follow-up venography showed no significant residual or recurrent stenosis, extravasation, dissection, or other complication.  IMPRESSION: 1. Tandem arterial and venous anastomotic stenoses, with good response to 5 and 7 mm balloon angioplasty respectively.  ACCESS: Remains approachable for percutaneous intervention as needed.   Electronically Signed   By: Corlis Leak M.D.   On: 11/17/2014 13:51   Ir US Guide Vasc Access Right  11/17/2014   CLINICAL DATA:  End stage renal disease, decreased flows during hemodialysis  FLUOROSCOPY TIME:  2 minutes 18 seconds  COMPARISON:  None available  EXAM: DIALYSIS SHUNTOGRAM  VENOUS ANGIOPLASTY  ARTERIAL ANGIOPLASTY  TECHNIQUE: The procedure, risks (including but not limited to bleeding, infection, organ damage ), benefits, and alternatives were explained to the patient. Questions regarding the procedure were encouraged and answered. The patient understands and consents to the procedure.  An 18-gauge angiocatheter was placed under real-time ultrasound guidance antegrade into the arterial limb of the patient's right femoralAV hemodialysis graft for dialysis fistulography. The angiocatheter and surrounding skin were then prepped with Betadine, draped in usual sterile fashion, infiltrated locally with 1% lidocaine. The angiocatheter was exchanged over a Benson wire for a 6 [place] vascular sheath, through which a 7 mm x 4 cm Conquest angioplasty balloon was advanced to the level of a venous  anastomoticstenosis for venous angioplasty using 60 second overlapping inflations at 30 atmospheres.  In similar fashion, the arterial limb of the graft was accessed retrograde with a micropuncture set, exchanged over a Bentson wire for a short 6 French sheath through which a 5 mm x 2 cm Mustang angioplasty balloon was advanced across an arterial anastomotic stenosis for angioplasty of 24 atmospheres. After follow-up venography, the catheter, sheath, and guidewire were removed and hemostasis achieved with a 2-0 Ethilon purse-string suture. No immediate complication.  FINDINGS: There is a short segment high-grade stenosis at the venous arterial anastomosis. The graft is patent. There is a high-grade short-segment stenosis of the venous anastomosis. Native venous outflow through the IVC is widely patent.  The arterial anastomotic stenosis responded readily to 5 mm balloon angioplasty. Follow-up should no significant residual or recurrent stenosis, extravasation, dissection, or other complication.  The venous anastomotic stenosis responded readily to 7 mm balloon angioplasty. Follow-up venography showed no significant residual or recurrent stenosis, extravasation, dissection, or other complication.  IMPRESSION: 1. Tandem arterial and venous anastomotic stenoses, with good response to 5 and 7 mm balloon angioplasty respectively.  ACCESS: Remains approachable for percutaneous intervention as needed.   Electronically Signed   By: Corlis Leak M.D.   On: 11/17/2014 13:51   Ir Shuntogram/ Fistulagram Right Mod Sed  11/17/2014   CLINICAL DATA:  End stage renal disease, decreased flows during hemodialysis  FLUOROSCOPY TIME:  2 minutes 18 seconds  COMPARISON:  None available  EXAM: DIALYSIS SHUNTOGRAM  VENOUS ANGIOPLASTY  ARTERIAL ANGIOPLASTY  TECHNIQUE: The procedure, risks (including but not limited to bleeding, infection, organ damage ), benefits, and alternatives were explained to the patient.  Questions regarding the  procedure were encouraged and answered. The patient understands and consents to the procedure.  An 18-gauge angiocatheter was placed under real-time ultrasound guidance antegrade into the arterial limb of the patient's right femoralAV hemodialysis graft for dialysis fistulography. The angiocatheter and surrounding skin were then prepped with Betadine, draped in usual sterile fashion, infiltrated locally with 1% lidocaine. The angiocatheter was exchanged over a Benson wire for a 6 [place] vascular sheath, through which a 7 mm x 4 cm Conquest angioplasty balloon was advanced to the level of a venous anastomoticstenosis for venous angioplasty using 60 second overlapping inflations at 30 atmospheres.  In similar fashion, the arterial limb of the graft was accessed retrograde with a micropuncture set, exchanged over a Bentson wire for a short 6 French sheath through which a 5 mm x 2 cm Mustang angioplasty balloon was advanced across an arterial anastomotic stenosis for angioplasty of 24 atmospheres. After follow-up venography, the catheter, sheath, and guidewire were removed and hemostasis achieved with a 2-0 Ethilon purse-string suture. No immediate complication.  FINDINGS: There is a short segment high-grade stenosis at the venous arterial anastomosis. The graft is patent. There is a high-grade short-segment stenosis of the venous anastomosis. Native venous outflow through the IVC is widely patent.  The arterial anastomotic stenosis responded readily to 5 mm balloon angioplasty. Follow-up should no significant residual or recurrent stenosis, extravasation, dissection, or other complication.  The venous anastomotic stenosis responded readily to 7 mm balloon angioplasty. Follow-up venography showed no significant residual or recurrent stenosis, extravasation, dissection, or other complication.  IMPRESSION: 1. Tandem arterial and venous anastomotic stenoses, with good response to 5 and 7 mm balloon angioplasty  respectively.  ACCESS: Remains approachable for percutaneous intervention as needed.   Electronically Signed   By: Corlis Leak  Hassell M.D.   On: 11/17/2014 13:51   EXAM: General appearance:  Alert, in no apparent distress Resp:  CTA without rales, rhonchi, or wheezes Cardio:  RRR without murmur or rub GI:  + BS, soft and nontender Extremities:  No edema  Access:  R thigh AVG with BFR 400  Dialysis Orders: [facility] 4 hr 180 2K/2.25ca 60kgs 3000u hep 400/1.5  Aranesp 60 q week hectorol 3 Q hd  Assessment/Plan: 1. Recurrent hypoglycemia & hypothermia - DM Type 1, glucose 343 last night, BCs 2/11 with no growth (same as previous cultures), per primary. 2. Chest pain - last episode 2 nights ago. Consider stress test for silent ischemia 3. ESRD - HD on TTS @ [facility], K 4.2. HD today. 4. HTN/Volume - BP 141/78 on Amlodipine 10 mg qd, Lisinopril 20 bid, Carvedilol 25 mg qd; wt 61.7 kg with UF goal 2 L. 5. Anemia - Hgb 10.9, Aranesp 60 mcg on Tues. 6. Sec HPT - Ca 8.7 (9.3 corrected), P 6.8; Hectorol 3 mcg, Fosrenol 1 g with meals. 7. Nutrition - Alb 3.2, renal carb-mod diet, vitamin.  8. Dispo - ALF placement being considered   LOS: 2 days   [name],[name] 11/18/2014,8:20 AM  Pt seen, examined, agree w assess/plan as above with additions as indicated.  Vinson Moselleob Machaela Caterino MD pager [phone]    cell [phone] 11/18/2014, 8:29 AM

## 2014-11-18 NOTE — Progress Notes (Signed)
Family Medicine Teaching Service Daily Progress Note Intern Pager: [phone]  Patient name: [name] Medical record number: [id number] Date of birth: 1980-10-25 Age: 34 y.o. Gender: female  Primary Care Provider: Hazeline Junker, MD Consultants: nephrology  Code Status: FULL  Pt Overview and Major Events to Date:  2/12: HD today?   Assessment and Plan: 34 y.o. female presenting with hyperglycemia and hypoglycemia. PMH is significant for type 1 diabetes, diabetic retinopathy/blind, frequent DKA, DVT of upper extremity, end-stage renal disease on HD, hypertension.  #Hypoglycemia/DM type I: Brittle diabetic history. Reported roommate heard seizure noises early this am, EMS called CBG 22, treated in route to ED. ED CBG 59->127. Pt states she has been compliant with medications and using as directed. CBG this am 343 - Sensitive SSI, 14 total units of Novolog/24 hours. Lantus 8 units BID--Will increase dose of Lantus to 9 BID - Carb modified diet - Hypoglycemia protocol.   #ESRD on HD- Receives dialysis on Tuesday, Thursday, Saturday. Last dialysis was Tuesday. Cr on admission 11.3, BUN 65.  - Creatinine 10.01, Potassium 4.2 - Hemodialysis today - Consult Nephrology, appreciate recs  - Shuntogram normal  #Abdominal pain/constipation: CT on last admission positive for fluid overload, and fatty liver. WBC normal. Appears epigastric in nature. WBC within normal limits, troponin negative, white count normal. Lactic acid normal. - Has had negative renal stone CT, KUB and abdominal U/S in last month. - Bowel regimen: Colace/Senna daily, suppository PRN  #HTN: BP 140/76 this am - Home medications include Amlodipine 10mg , Coreg 25mg  BID, and Lisinopril.  - Continue Amlodipine, Coreg, and Lisinopril   #Suicidal Ideation- Noted on a hospitalization prior. Not voicing SI but I question her capacity as she was sent home on a regimen that proved to work well for her during last admission  and again is coming in with hypoglycemia 2/2 taking too much insulin.  - Continue to monitor throughout hospitalization - Psychiatry consulted given poor insight over dosing of insulin resulting in multiple hospitalizations. Concern that may be deliberate given recent SI. Question capacity. Appreciate recommendations.  # Social: Patient reports that she is a "couch hopper".  She lists her sister's address in [place] as mailing but has no permanent address.  Per PT note, patient agreeable to assisted living facility. -c/s CSW.  They will work to see if there is placement for her available.  FEN/GI: Carb Modified diet Prophylaxis: Subcutaneous Heparin  Disposition: Discharge pending improvement in blood sugar, psych evaluation, possible placement in assisted living facility  Subjective:  No complaints overnight. Still feels similar to admission, despite improvement of sugars. Currently in hemodialysis. No further concerns.  Objective: Temp:  [98.2 F (36.8 C)-99.4 F (37.4 C)] 98.2 F (36.8 C) (02/13 0729) Pulse Rate:  [63-79] 70 (02/13 0930) Resp:  [12-18] 16 (02/13 0930) BP: (130-149)/(71-82) 133/82 mmHg (02/13 0930) SpO2:  [98 %-100 %] 99 % (02/13 0729) Weight:  [133 lb (60.328 kg)-136 lb 0.4 oz (61.7 kg)] 136 lb 0.4 oz (61.7 kg) (02/13 0729) Physical Exam: General: 33yo female resting comfortably in no apparent distress, currently in dialysis Cardiovascular: S1 and S2 noted. No murmurs/rubs/gallops. Regular rate and rhythm. Respiratory: CTAB, No wheezes noted. No increased work of breathing. Abdomen: soft, mild generalized TTP, no peritoneal signs Extremities: WWP, no edema Psych: affect flat, speech normal, mood stable Neuro: blind at baseline, otherwise no focal deficits  Laboratory:  Recent Labs Lab 11/17/14 0505 11/17/14 2259 11/18/14 0900  WBC 5.4 5.0 4.7  HGB 13.9 10.9* 10.2*  HCT  39.0 33.0* 29.5*  PLT 178 188 188    Recent Labs Lab 11/12/14 1942   11/16/14 0825 11/17/14 0500 11/18/14 0505  NA 138  < > 139 132* 134*  K 3.9  < > 4.1 5.7* 4.2  CL 99  < > 105 94* 96  CO2 24  < > 19 17* 26  BUN 40*  < > 65* 29* 45*  CREATININE 8.67*  < > 11.30* 6.74* 10.01*  CALCIUM 8.5  < > 9.2 9.0 8.7  PROT 7.0  --   --   --   --   BILITOT 0.9  --   --   --   --   ALKPHOS 64  --   --   --   --   ALT 13  --   --   --   --   AST 15  --   --   --   --   GLUCOSE 412*  < > 145* 545* 209*  < > = values in this interval not displayed. - Troponin negative - TSH 14.6 - UDS negative  Imaging/Diagnostic Tests: Ir Pta Venous Right  11/17/2014   CLINICAL DATA:  End stage renal disease, decreased flows during hemodialysis  FLUOROSCOPY TIME:  2 minutes 18 seconds  COMPARISON:  None available  EXAM: DIALYSIS SHUNTOGRAM  VENOUS ANGIOPLASTY  ARTERIAL ANGIOPLASTY  TECHNIQUE: The procedure, risks (including but not limited to bleeding, infection, organ damage ), benefits, and alternatives were explained to the patient. Questions regarding the procedure were encouraged and answered. The patient understands and consents to the procedure.  An 18-gauge angiocatheter was placed under real-time ultrasound guidance antegrade into the arterial limb of the patient's right femoralAV hemodialysis graft for dialysis fistulography. The angiocatheter and surrounding skin were then prepped with Betadine, draped in usual sterile fashion, infiltrated locally with 1% lidocaine. The angiocatheter was exchanged over a Benson wire for a 6 [place] vascular sheath, through which a 7 mm x 4 cm Conquest angioplasty balloon was advanced to the level of a venous anastomoticstenosis for venous angioplasty using 60 second overlapping inflations at 30 atmospheres.  In similar fashion, the arterial limb of the graft was accessed retrograde with a micropuncture set, exchanged over a Bentson wire for a short 6 French sheath through which a 5 mm x 2 cm Mustang angioplasty balloon was advanced across an  arterial anastomotic stenosis for angioplasty of 24 atmospheres. After follow-up venography, the catheter, sheath, and guidewire were removed and hemostasis achieved with a 2-0 Ethilon purse-string suture. No immediate complication.  FINDINGS: There is a short segment high-grade stenosis at the venous arterial anastomosis. The graft is patent. There is a high-grade short-segment stenosis of the venous anastomosis. Native venous outflow through the IVC is widely patent.  The arterial anastomotic stenosis responded readily to 5 mm balloon angioplasty. Follow-up should no significant residual or recurrent stenosis, extravasation, dissection, or other complication.  The venous anastomotic stenosis responded readily to 7 mm balloon angioplasty. Follow-up venography showed no significant residual or recurrent stenosis, extravasation, dissection, or other complication.  IMPRESSION: 1. Tandem arterial and venous anastomotic stenoses, with good response to 5 and 7 mm balloon angioplasty respectively.  ACCESS: Remains approachable for percutaneous intervention as needed.   Electronically Signed   By: Corlis Leak M.D.   On: 11/17/2014 13:51   Ir Pta Venous Right  11/17/2014   CLINICAL DATA:  End stage renal disease, decreased flows during hemodialysis  FLUOROSCOPY TIME:  2 minutes 18 seconds  COMPARISON:  None available  EXAM: DIALYSIS SHUNTOGRAM  VENOUS ANGIOPLASTY  ARTERIAL ANGIOPLASTY  TECHNIQUE: The procedure, risks (including but not limited to bleeding, infection, organ damage ), benefits, and alternatives were explained to the patient. Questions regarding the procedure were encouraged and answered. The patient understands and consents to the procedure.  An 18-gauge angiocatheter was placed under real-time ultrasound guidance antegrade into the arterial limb of the patient's right femoralAV hemodialysis graft for dialysis fistulography. The angiocatheter and surrounding skin were then prepped with Betadine, draped in  usual sterile fashion, infiltrated locally with 1% lidocaine. The angiocatheter was exchanged over a Benson wire for a 6 [place] vascular sheath, through which a 7 mm x 4 cm Conquest angioplasty balloon was advanced to the level of a venous anastomoticstenosis for venous angioplasty using 60 second overlapping inflations at 30 atmospheres.  In similar fashion, the arterial limb of the graft was accessed retrograde with a micropuncture set, exchanged over a Bentson wire for a short 6 French sheath through which a 5 mm x 2 cm Mustang angioplasty balloon was advanced across an arterial anastomotic stenosis for angioplasty of 24 atmospheres. After follow-up venography, the catheter, sheath, and guidewire were removed and hemostasis achieved with a 2-0 Ethilon purse-string suture. No immediate complication.  FINDINGS: There is a short segment high-grade stenosis at the venous arterial anastomosis. The graft is patent. There is a high-grade short-segment stenosis of the venous anastomosis. Native venous outflow through the IVC is widely patent.  The arterial anastomotic stenosis responded readily to 5 mm balloon angioplasty. Follow-up should no significant residual or recurrent stenosis, extravasation, dissection, or other complication.  The venous anastomotic stenosis responded readily to 7 mm balloon angioplasty. Follow-up venography showed no significant residual or recurrent stenosis, extravasation, dissection, or other complication.  IMPRESSION: 1. Tandem arterial and venous anastomotic stenoses, with good response to 5 and 7 mm balloon angioplasty respectively.  ACCESS: Remains approachable for percutaneous intervention as needed.   Electronically Signed   By: Corlis Leak  Hassell M.D.   On: 11/17/2014 13:51   Ir Angio Av Shunt Addl Access  11/17/2014   CLINICAL DATA:  End stage renal disease, decreased flows during hemodialysis  FLUOROSCOPY TIME:  2 minutes 18 seconds  COMPARISON:  None available  EXAM: DIALYSIS SHUNTOGRAM   VENOUS ANGIOPLASTY  ARTERIAL ANGIOPLASTY  TECHNIQUE: The procedure, risks (including but not limited to bleeding, infection, organ damage ), benefits, and alternatives were explained to the patient. Questions regarding the procedure were encouraged and answered. The patient understands and consents to the procedure.  An 18-gauge angiocatheter was placed under real-time ultrasound guidance antegrade into the arterial limb of the patient's right femoralAV hemodialysis graft for dialysis fistulography. The angiocatheter and surrounding skin were then prepped with Betadine, draped in usual sterile fashion, infiltrated locally with 1% lidocaine. The angiocatheter was exchanged over a Benson wire for a 6 [place] vascular sheath, through which a 7 mm x 4 cm Conquest angioplasty balloon was advanced to the level of a venous anastomoticstenosis for venous angioplasty using 60 second overlapping inflations at 30 atmospheres.  In similar fashion, the arterial limb of the graft was accessed retrograde with a micropuncture set, exchanged over a Bentson wire for a short 6 French sheath through which a 5 mm x 2 cm Mustang angioplasty balloon was advanced across an arterial anastomotic stenosis for angioplasty of 24 atmospheres. After follow-up venography, the catheter, sheath, and guidewire were removed and hemostasis achieved with a 2-0 Ethilon purse-string suture. No immediate complication.  FINDINGS: There is a short segment high-grade stenosis at the venous arterial anastomosis. The graft is patent. There is a high-grade short-segment stenosis of the venous anastomosis. Native venous outflow through the IVC is widely patent.  The arterial anastomotic stenosis responded readily to 5 mm balloon angioplasty. Follow-up should no significant residual or recurrent stenosis, extravasation, dissection, or other complication.  The venous anastomotic stenosis responded readily to 7 mm balloon angioplasty. Follow-up venography showed no  significant residual or recurrent stenosis, extravasation, dissection, or other complication.  IMPRESSION: 1. Tandem arterial and venous anastomotic stenoses, with good response to 5 and 7 mm balloon angioplasty respectively.  ACCESS: Remains approachable for percutaneous intervention as needed.   Electronically Signed   By: Corlis Leak M.D.   On: 11/17/2014 13:51   Ir US Guide Vasc Access Right  11/17/2014   CLINICAL DATA:  End stage renal disease, decreased flows during hemodialysis  FLUOROSCOPY TIME:  2 minutes 18 seconds  COMPARISON:  None available  EXAM: DIALYSIS SHUNTOGRAM  VENOUS ANGIOPLASTY  ARTERIAL ANGIOPLASTY  TECHNIQUE: The procedure, risks (including but not limited to bleeding, infection, organ damage ), benefits, and alternatives were explained to the patient. Questions regarding the procedure were encouraged and answered. The patient understands and consents to the procedure.  An 18-gauge angiocatheter was placed under real-time ultrasound guidance antegrade into the arterial limb of the patient's right femoralAV hemodialysis graft for dialysis fistulography. The angiocatheter and surrounding skin were then prepped with Betadine, draped in usual sterile fashion, infiltrated locally with 1% lidocaine. The angiocatheter was exchanged over a Benson wire for a 6 [place] vascular sheath, through which a 7 mm x 4 cm Conquest angioplasty balloon was advanced to the level of a venous anastomoticstenosis for venous angioplasty using 60 second overlapping inflations at 30 atmospheres.  In similar fashion, the arterial limb of the graft was accessed retrograde with a micropuncture set, exchanged over a Bentson wire for a short 6 French sheath through which a 5 mm x 2 cm Mustang angioplasty balloon was advanced across an arterial anastomotic stenosis for angioplasty of 24 atmospheres. After follow-up venography, the catheter, sheath, and guidewire were removed and hemostasis achieved with a 2-0 Ethilon  purse-string suture. No immediate complication.  FINDINGS: There is a short segment high-grade stenosis at the venous arterial anastomosis. The graft is patent. There is a high-grade short-segment stenosis of the venous anastomosis. Native venous outflow through the IVC is widely patent.  The arterial anastomotic stenosis responded readily to 5 mm balloon angioplasty. Follow-up should no significant residual or recurrent stenosis, extravasation, dissection, or other complication.  The venous anastomotic stenosis responded readily to 7 mm balloon angioplasty. Follow-up venography showed no significant residual or recurrent stenosis, extravasation, dissection, or other complication.  IMPRESSION: 1. Tandem arterial and venous anastomotic stenoses, with good response to 5 and 7 mm balloon angioplasty respectively.  ACCESS: Remains approachable for percutaneous intervention as needed.   Electronically Signed   By: Corlis Leak M.D.   On: 11/17/2014 13:51   Ir Shuntogram/ Fistulagram Right Mod Sed  11/17/2014   CLINICAL DATA:  End stage renal disease, decreased flows during hemodialysis  FLUOROSCOPY TIME:  2 minutes 18 seconds  COMPARISON:  None available  EXAM: DIALYSIS SHUNTOGRAM  VENOUS ANGIOPLASTY  ARTERIAL ANGIOPLASTY  TECHNIQUE: The procedure, risks (including but not limited to bleeding, infection, organ damage ), benefits, and alternatives were explained to the patient. Questions regarding the procedure were encouraged and answered. The patient understands and consents to the procedure.  An 18-gauge angiocatheter was placed under real-time ultrasound guidance antegrade into the arterial limb of the patient's right femoralAV hemodialysis graft for dialysis fistulography. The angiocatheter and surrounding skin were then prepped with Betadine, draped in usual sterile fashion, infiltrated locally with 1% lidocaine. The angiocatheter was exchanged over a Benson wire for a 6 [place] vascular sheath, through which a 7 mm  x 4 cm Conquest angioplasty balloon was advanced to the level of a venous anastomoticstenosis for venous angioplasty using 60 second overlapping inflations at 30 atmospheres.  In similar fashion, the arterial limb of the graft was accessed retrograde with a micropuncture set, exchanged over a Bentson wire for a short 6 French sheath through which a 5 mm x 2 cm Mustang angioplasty balloon was advanced across an arterial anastomotic stenosis for angioplasty of 24 atmospheres. After follow-up venography, the catheter, sheath, and guidewire were removed and hemostasis achieved with a 2-0 Ethilon purse-string suture. No immediate complication.  FINDINGS: There is a short segment high-grade stenosis at the venous arterial anastomosis. The graft is patent. There is a high-grade short-segment stenosis of the venous anastomosis. Native venous outflow through the IVC is widely patent.  The arterial anastomotic stenosis responded readily to 5 mm balloon angioplasty. Follow-up should no significant residual or recurrent stenosis, extravasation, dissection, or other complication.  The venous anastomotic stenosis responded readily to 7 mm balloon angioplasty. Follow-up venography showed no significant residual or recurrent stenosis, extravasation, dissection, or other complication.  IMPRESSION: 1. Tandem arterial and venous anastomotic stenoses, with good response to 5 and 7 mm balloon angioplasty respectively.  ACCESS: Remains approachable for percutaneous intervention as needed.   Electronically Signed   By: Corlis Leak M.D.   On: 11/17/2014 13:51    Araceli Bouche, DO 11/18/2014, 10:04 AM PGY-1, [facility] FPTS Intern pager: [phone], text pages welcome

## 2014-11-19 DIAGNOSIS — E08311 Diabetes mellitus due to underlying condition with unspecified diabetic retinopathy with macular edema: Secondary | ICD-10-CM

## 2014-11-19 DIAGNOSIS — E11319 Type 2 diabetes mellitus with unspecified diabetic retinopathy without macular edema: Secondary | ICD-10-CM | POA: Insufficient documentation

## 2014-11-19 DIAGNOSIS — E1039 Type 1 diabetes mellitus with other diabetic ophthalmic complication: Secondary | ICD-10-CM | POA: Insufficient documentation

## 2014-11-19 LAB — BASIC METABOLIC PANEL
ANION GAP: 13 (ref 5–15)
Anion gap: UNDETERMINED (ref 5–15)
BUN: UNDETERMINED mg/dL (ref 6–23)
BUN: 30 mg/dL — ABNORMAL HIGH (ref 6–23)
CALCIUM: UNDETERMINED mg/dL (ref 8.4–10.5)
CALCIUM: 9.4 mg/dL (ref 8.4–10.5)
CHLORIDE: UNDETERMINED mmol/L (ref 96–112)
CHLORIDE: 96 mmol/L (ref 96–112)
CO2: UNDETERMINED mmol/L (ref 19–32)
CO2: 23 mmol/L (ref 19–32)
CREATININE: 7.91 mg/dL — AB (ref 0.50–1.10)
Creatinine, Ser: 7.7 mg/dL — ABNORMAL HIGH (ref 0.50–1.10)
GFR calc non Af Amer: 6 mL/min — ABNORMAL LOW (ref >90)
GFR, EST AFRICAN AMERICAN: 7 mL/min — AB (ref >90)
GFR, EST AFRICAN AMERICAN: 7 mL/min — AB (ref >90)
GFR, EST NON AFRICAN AMERICAN: 6 mL/min — AB (ref >90)
Glucose, Bld: 178 mg/dL — ABNORMAL HIGH (ref 70–99)
Glucose, Bld: 96 mg/dL (ref 70–99)
Potassium: UNDETERMINED mmol/L (ref 3.5–5.1)
Potassium: 4.2 mmol/L (ref 3.5–5.1)
SODIUM: UNDETERMINED mmol/L (ref 135–145)
Sodium: 132 mmol/L — ABNORMAL LOW (ref 135–145)

## 2014-11-19 LAB — TROPONIN I: Troponin I: <0.03 ng/mL (ref <0.031)

## 2014-11-19 LAB — CULTURE, BLOOD (ROUTINE X 2)
Culture: NO GROWTH
Culture: NO GROWTH

## 2014-11-19 LAB — GLUCOSE, CAPILLARY
GLUCOSE-CAPILLARY: 137 mg/dL — AB (ref 70–99)
GLUCOSE-CAPILLARY: 230 mg/dL — AB (ref 70–99)
GLUCOSE-CAPILLARY: 155 mg/dL — AB (ref 70–99)
GLUCOSE-CAPILLARY: 174 mg/dL — AB (ref 70–99)
GLUCOSE-CAPILLARY: 145 mg/dL — AB (ref 70–99)
Glucose-Capillary: 106 mg/dL — ABNORMAL HIGH (ref 70–99)
Glucose-Capillary: 120 mg/dL — ABNORMAL HIGH (ref 70–99)
Glucose-Capillary: 150 mg/dL — ABNORMAL HIGH (ref 70–99)
Glucose-Capillary: 156 mg/dL — ABNORMAL HIGH (ref 70–99)
Glucose-Capillary: 192 mg/dL — ABNORMAL HIGH (ref 70–99)
Glucose-Capillary: 20 mg/dL — CL (ref 70–99)
Glucose-Capillary: 201 mg/dL — ABNORMAL HIGH (ref 70–99)
Glucose-Capillary: 353 mg/dL — ABNORMAL HIGH (ref 70–99)

## 2014-11-19 MED ORDER — BISACODYL 10 MG RE SUPP
10.0000 mg | Freq: Once | RECTAL | Status: AC
  Administered 2014-11-19: 10 mg via RECTAL
  Filled 2014-11-19: qty 1

## 2014-11-19 MED ORDER — DEXTROSE 50 % IV SOLN
INTRAVENOUS | Status: AC
  Administered 2014-11-19: 50 mL
  Filled 2014-11-19: qty 50

## 2014-11-19 MED ORDER — INSULIN GLARGINE 100 UNIT/ML ~~LOC~~ SOLN
8.0000 [IU] | Freq: Two times a day (BID) | SUBCUTANEOUS | Status: DC
  Administered 2014-11-20: 8 [IU] via SUBCUTANEOUS
  Filled 2014-11-19 (×2): qty 0.08

## 2014-11-19 NOTE — Progress Notes (Signed)
11/19/2014   10:39 PM  Attempted Reported  HILL, Susan SalinasYANNE

## 2014-11-19 NOTE — Progress Notes (Signed)
11/19/2014 11:07 PM  Patient transported to 3S15 with all of her belongings and patient chart. One last CBG was checked before transferred. Report given to new RN on 3S  HILL, Sahaj Bona]

## 2014-11-19 NOTE — Progress Notes (Signed)
Subjective:  No complaints, alert, sitting up in bed  Objective: Vital signs in last 24 hours: Temp:  [97.9 F (36.6 C)-98.7 F (37.1 C)] 98.4 F (36.9 C) (02/14 0809) Pulse Rate:  [66-80] 74 (02/14 0809) Resp:  [12-18] 17 (02/14 0809) BP: (117-161)/(68-86) 134/77 mmHg (02/14 0809) SpO2:  [98 %-100 %] 98 % (02/14 0809) Weight:  [59.2 kg (130 lb 8.2 oz)-60.4 kg (133 lb 2.5 oz)] 60.4 kg (133 lb 2.5 oz) (02/13 2026) Weight change: 1.372 kg (3 lb 0.4 oz)  Intake/Output from previous day: 02/13 0701 - 02/14 0700 In: 480 [P.O.:480] Out: 2000  Intake/Output this shift:   Lab Results:  Recent Labs  11/18/14 0900 11/18/14 1449  WBC 4.7 4.5  HGB 10.2* 12.4  HCT 29.5* 35.9*  PLT 188 222   BMET:  Recent Labs  11/17/14 0500 11/18/14 0505  NA 132* 134*  K 5.7* 4.2  CL 94* 96  CO2 17* 26  GLUCOSE 545* 209*  BUN 29* 45*  CREATININE 6.74* 10.01*  CALCIUM 9.0 8.7  ALBUMIN  --  3.2*   No results for input(s): PTH in the last 72 hours. Iron Studies: No results for input(s): IRON, TIBC, TRANSFERRIN, FERRITIN in the last 72 hours.  Studies/Results: No results found.   EXAM: General appearance: Alert, in no apparent distress Resp: CTA without rales, rhonchi, or wheezes Cardio: RRR without murmur or rub GI: + BS, soft and nontender Extremities: No edema  Access: R thigh AVG with BFR 400  Dialysis Orders: TTS NW 4 hr 180 2K/2.25ca 60kgs 3000u hep 400/1.5  Aranesp 60 q week hectorol 3 Q hd   Assessment/Plan: 1. Recurrent hypoglycemia / type 1 DM - at baseline 2. ESRD - HD on TTS 3. HTN/Volume - BP good on 3 BP meds, vol stable 4. Anemia - Hgb 12.4, Aranesp 60 mcg on Tues. 5. Sec HPT - Ca 8.7 (9.3 corrected), P 6.8; Hectorol 3 mcg, Fosrenol 1 g with meals. 6. Nutrition - Alb 3.2, renal carb-mod diet, vitamin. 7. Dispo - not going to ALF as of today    LOS: 3 days   LYLES,CHARLES 11/19/2014,8:35 AM  Pt seen, examined and agree w A/P as  above. Patient OK for dc from renal standpoint. Have d/w primary team.  Vinson Moselleob Mckennon Zwart MD pager [phone]    cell [phone] 11/19/2014, 11:59 AM

## 2014-11-19 NOTE — Progress Notes (Signed)
11/19/2014 9:01 PM  Patient called to front desk around 1945 moaning and not making sense. RN went to check on patient and she was unresponsive, cool, clammy and severely diaphoretic. CBG was checked at 1955 and resulted to be 20. 1 amp of dextrose was given at 1958 (check eMAR). While giving the Dextrose amp the patient started to jerk and became very rigid. Began to foam at the mouth and drool. Suctioning was then setup.   Rapid Response called. On call Family Medicine called.  Rapid and Family Medicine at bedside and advised to give another AMP due to patient still unresponsive. Vital signs were taken; stable. Temp was taken rectally and was 93.2 Second amp ws given around 2003 Administrator, arts(check eMAR). CBG was checked again at 2008 and was 353. Gown and bed changed. Warm blankets were applied. 2026 CBG was now 230.   Patient now able to answer questions and hold a normal conversation. Stated that her chest was hurting badly. Explained to her that we did sternum rub on her quite frequently to arouse her. Family Medicine ordered EKG for baseline.   Third CBG at 2050 was 174 and patient even more alert and oriented at this time as well.   Will continue to monitor and assess the patient throughout the night.   PACCAR Incyanne Hill BSN, [place], RN-BC [facility] [place]  [phone]

## 2014-11-19 NOTE — Progress Notes (Signed)
Family Medicine Teaching Service Daily Progress Note Intern Pager: [phone]  Patient name: [name] Medical record number: [id number] Date of birth: [birth year] Age: 34 y.o. Gender: female  Primary Care Provider: Hazeline Junker, MD Consultants: nephrology  Code Status: FULL  Pt Overview and Major Events to Date:  2/12: shuntogram  Assessment and Plan: [name] is a 34 y.o. female presenting with hyperglycemia and hypoglycemia. PMH is significant for type 1 diabetes, diabetic retinopathy/blind, frequent DKA, DVT of upper extremity, end-stage renal disease on HD, hypertension.  #Hypoglycemia/DM type I: Brittle diabetic history. Reported roommate heard seizure noises early this am, EMS called CBG 22, treated in route to ED. ED CBG 59->127. Pt states she has been compliant with medications and using as directed. CBG 147-829 - sensitive SSI, 7 total units of Novolog/24 hours. Lantus 9 units BID - Carb modified diet - hypoglycemia protocol.   #Hypothermia: Unknown cause of hypothermia, considering hypothyroidism vs infection. Patient temp on ED arrival 93.4. She was placed on warming blanket, current temperature 95.8. WBC 4.5. TSH slightly elevated to 5.35 last admission and now 14.61 in ED. Hypoglycemia from insulin overdose still most likely cause. T 98.38F - Continue synthroid daily - Lactic acid normal - Blood cultures drawn in ED and vanc/zosyn given - no further abx at this time  #ESRD on HD- Receives dialysis on Tuesday, Thursday, Saturday. Last dialysis was Tuesday. Cr on admission 11.3, BUN 65. Cr 6.74. K 5.7.  HD Tues - Consult Nephrology, appreciate recs  -Shuntogram normal  #Abdominal pain/constipation: CT on last admission positive for fluid overload, and fatty liver. WBC normal. Appears epigastric in nature. WBC within normal limits, troponin negative, white count normal. Lactic acid normal. - Has had negative renal stone CT, KUB and abdominal U/S in last month. -  Bowel regimen: Colace/Senna daily, suppository PRN  #HTN: BP 134/77 this am - Home medications include Amlodipine , Coreg  BID, and Lisinopril.  - Continue Amlodipine, Coreg, and Lisinopril   #Suicidal Ideation- Noted on a hospitalization prior. Not voicing SI but I question her capacity as she was sent home on a regimen that proved to work well for her during last admission and again is coming in with hypoglycemia 2/2 taking too much insulin.  - Continue to monitor throughout hospitalization - c/s Psych given poor insight to over dosing of insulin. Patient has capacity.  # Social: Patient reports that she is a "couch hopper".  She lists her sister's address in [place] as mailing but has no permanent address. Patient no longer agreeable to SNF.  States that she will live with her sister in [place]. -c/s CSW.  Will FYI CSW.  FEN/GI: Carb Modified diet Prophylaxis: Subcutaneous Heparin  Disposition: Discharge pending setup of [place] HD.  Patient will contact them in am.  Discharge 02/15.  Subjective:  Patient reports that she is still having trouble having a BM.  Asks for suppository.  She denies abdominal pain, nausea, vomiting.  She does state that she has not been eating like she does at home because she doesn't want her sugars to go too high.  I explained that I would rather she eat normally so we can dose her insulin appropriately.  She also states that she has decided to NOT pursue SNF.  She is planning on moving in with her sister and getting HD at a new clinic in [place].  She is going to call tomorrow to set this up.  No CP, SOB.  Spoke to Renal.  They agree with discharge and ask that patient set up her HD accordingly.  Objective: Temp:  [97.9 F (36.6 C)-98.7 F (37.1 C)] 98.4 F (36.9 C) (02/14 0809) Pulse Rate:  [70-80] 74 (02/14 0809) Resp:  [17-18] 17 (02/14 0809) BP: (117-152)/(68-78) 134/77 mmHg (02/14 0809) SpO2:  [98 %-100 %] 98  % (02/14 0809) Weight:  [133 lb 2.5 oz (60.4 kg)] 133 lb 2.5 oz (60.4 kg) (02/13 2026) Physical Exam: General: awake, alert, well nourished, NAD,lying in bed Cardiovascular: RRR, no murmurs Respiratory: CTAB, no increased WOB Abdomen: soft, ND/NT, +BS Extremities: WWP, no edema Psych: affect flat, speech normal, mood stable Neuro: blind at baseline, otherwise no focal deficits  Laboratory:  Recent Labs Lab 11/17/14 2259 11/18/14 0900 11/18/14 1449  WBC 5.0 4.7 4.5  HGB 10.9* 10.2* 12.4  HCT 33.0* 29.5* 35.9*  PLT 188 188 222    Recent Labs Lab 11/12/14 1942  11/17/14 0500 11/18/14 0505 11/19/14 1210  NA 138  < > 132* 134* PENDING  K 3.9  < > 5.7* 4.2 PENDING  CL 99  < > 94* 96 PENDING  CO2 24  < > 17* 26 PENDING  BUN 40*  < > 29* 45* PENDING  CREATININE 8.67*  < > 6.74* 10.01* 7.70*  CALCIUM 8.5  < > 9.0 8.7 PENDING  PROT 7.0  --   --   --   --   BILITOT 0.9  --   --   --   --   ALKPHOS 64  --   --   --   --   ALT 13  --   --   --   --   AST 15  --   --   --   --   GLUCOSE 412*  < > 545* 209* 178*  < > = values in this interval not displayed.   Imaging/Diagnostic Tests: No results found.  Susan IpAshly M Gottschalk, DO 11/19/2014, 2:43 PM PGY-1, [facility] FPTS Intern pager: [phone], text pages welcome

## 2014-11-19 NOTE — Progress Notes (Addendum)
**  Interval Note**  Paged by RN for hypoglycemic episode with unresponsiveness.  Patient s/p 2 units of novolog at 1607.  @1700  CBG appears to be 96.  Patient with CBG to 20@ 1955.  From report it appears that this happened rather quickly.  RN came into room to find patient unresponsive.  1 amp of dextrose given before I arrived in room.  On exam, patient minimally responsive to name, very diaphoretic, with cool clammy skin.  Vitals stable with 97%-99%O2 on RA.  Respiratory rate 11-12/min, pulse normal.  Rectal temp 93.42F.  Patient changed into dry clothes and warm blankets applied. Patient's CBG 320 after second amp if dextrose.  20 mins later down to 240.  Patient finally awake with slowed speech but following commands after about 20 mins.  Endorsing some chest discomfort.  Likely 2/2 sternal rub.  Previous 6 troponins negative.  Last @ 1700.  Will obtain EKG but again unlikely that cardiac in nature.  Asked RN to please recheck CBG at 8:45pm. Administer 1 amp dextrose if <100.  Recheck again after dextrose and then again in 20 minutes.  Also please call if any concerns.    Ashly M. Nadine CountsGottschalk, DO PGY-1, [facility]

## 2014-11-20 LAB — GLUCOSE, CAPILLARY
GLUCOSE-CAPILLARY: 400 mg/dL — AB (ref 70–99)
GLUCOSE-CAPILLARY: 237 mg/dL — AB (ref 70–99)
GLUCOSE-CAPILLARY: 317 mg/dL — AB (ref 70–99)
Glucose-Capillary: 153 mg/dL — ABNORMAL HIGH (ref 70–99)
Glucose-Capillary: 375 mg/dL — ABNORMAL HIGH (ref 70–99)
Glucose-Capillary: 426 mg/dL — ABNORMAL HIGH (ref 70–99)
Glucose-Capillary: 283 mg/dL — ABNORMAL HIGH (ref 70–99)
Glucose-Capillary: 492 mg/dL — ABNORMAL HIGH (ref 70–99)

## 2014-11-20 LAB — RENAL FUNCTION PANEL
ALBUMIN: 4 g/dL (ref 3.5–5.2)
Anion gap: 16 — ABNORMAL HIGH (ref 5–15)
BUN: 47 mg/dL — AB (ref 6–23)
CALCIUM: 9.5 mg/dL (ref 8.4–10.5)
CO2: 20 mmol/L (ref 19–32)
CREATININE: 11.6 mg/dL — AB (ref 0.50–1.10)
Chloride: 93 mmol/L — ABNORMAL LOW (ref 96–112)
GFR calc non Af Amer: 4 mL/min — ABNORMAL LOW (ref >90)
GFR, EST AFRICAN AMERICAN: 4 mL/min — AB (ref >90)
GLUCOSE: 386 mg/dL — AB (ref 70–99)
Phosphorus: 5.4 mg/dL — ABNORMAL HIGH (ref 2.3–4.6)
Potassium: 5.4 mmol/L — ABNORMAL HIGH (ref 3.5–5.1)
Sodium: 129 mmol/L — ABNORMAL LOW (ref 135–145)

## 2014-11-20 LAB — BASIC METABOLIC PANEL
ANION GAP: 14 (ref 5–15)
BUN: 34 mg/dL — AB (ref 6–23)
CO2: 23 mmol/L (ref 19–32)
Calcium: 8.8 mg/dL (ref 8.4–10.5)
Chloride: 94 mmol/L — ABNORMAL LOW (ref 96–112)
Creatinine, Ser: 9.36 mg/dL — ABNORMAL HIGH (ref 0.50–1.10)
GFR calc Af Amer: 6 mL/min — ABNORMAL LOW (ref >90)
GFR calc non Af Amer: 5 mL/min — ABNORMAL LOW (ref >90)
Glucose, Bld: 370 mg/dL — ABNORMAL HIGH (ref 70–99)
Potassium: 5.8 mmol/L — ABNORMAL HIGH (ref 3.5–5.1)
Sodium: 131 mmol/L — ABNORMAL LOW (ref 135–145)

## 2014-11-20 LAB — MRSA PCR SCREENING: MRSA by PCR: NEGATIVE

## 2014-11-20 MED ORDER — DOXERCALCIFEROL 4 MCG/2ML IV SOLN
3.0000 ug | INTRAVENOUS | Status: DC
  Administered 2014-11-23: 3 ug via INTRAVENOUS
  Filled 2014-11-20 (×2): qty 2

## 2014-11-20 MED ORDER — METOCLOPRAMIDE HCL 10 MG PO TABS
10.0000 mg | ORAL_TABLET | Freq: Three times a day (TID) | ORAL | Status: DC
  Administered 2014-11-20 – 2014-11-23 (×7): 10 mg via ORAL
  Filled 2014-11-20 (×12): qty 1

## 2014-11-20 MED ORDER — INSULIN GLARGINE 100 UNIT/ML ~~LOC~~ SOLN
9.0000 [IU] | Freq: Two times a day (BID) | SUBCUTANEOUS | Status: DC
  Administered 2014-11-20: 9 [IU] via SUBCUTANEOUS
  Filled 2014-11-20 (×3): qty 0.09

## 2014-11-20 NOTE — Progress Notes (Signed)
[facility].

## 2014-11-20 NOTE — Progress Notes (Signed)
[facility] Rounding Note Subjective:   Pt transferred to SDU following hypoglycemia with unresponsiveness yesterday BS was in the 20's CBG's now in the 300-400 range  Objective: Vital signs in last 24 hours: Temp:  [93.6 F (34.2 C)-99 F (37.2 C)] 98.5 F (36.9 C) (02/15 0700) Pulse Rate:  [58-69] 65 (02/15 0550) Resp:  [9-11] 11 (02/15 0550) BP: (125-154)/(68-78) 140/72 mmHg (02/15 0308) SpO2:  [98 %-100 %] 100 % (02/15 0550) Weight:  [59.5 kg (131 lb 2.8 oz)] 59.5 kg (131 lb 2.8 oz) (02/14 2315) Weight change: -2.2 kg (-4 lb 13.6 oz)     EXAM: General appearance:  Resp: CTA without rales, rhonchi, or wheezes Cardio: Regular. Normal S1S2 No S3.  GI: + BS, soft and nontender Extremities: No edema  Access: R thigh AVG + bruit.    Recent Labs  11/18/14 0900 11/18/14 1449  WBC 4.7 4.5  HGB 10.2* 12.4  HCT 29.5* 35.9*  PLT 188 222   CBG (last 3)   Recent Labs  11/20/14 0436 11/20/14 0522 11/20/14 0816  GLUCAP 375* 426* 400*    Recent Labs  11/18/14 0505  11/19/14 1700 11/20/14 0310  NA 134*  < > 132* 131*  K 4.2  < > 4.2 5.8*  CL 96  < > 96 94*  CO2 26  < > 23 23  GLUCOSE 209*  < > 96 370*  BUN 45*  < > 30* 34*  CREATININE 10.01*  < > 7.91* 9.36*  CALCIUM 8.7  < > 9.4 8.8  ALBUMIN 3.2*  --   --   --   < > = values in this interval not displayed. No results for input(s): PTH in the last 72 hours.  Medications . amLODipine  10 mg Oral BID  . aspirin  81 mg Oral Daily  . carvedilol  25 mg Oral q morning - 10a  . docusate sodium  100 mg Oral BID  . heparin  5,000 Units Subcutaneous 3 times per day  . insulin aspart  0-5 Units Subcutaneous QHS  . insulin aspart  0-9 Units Subcutaneous TID WC  . insulin aspart  1 Units Subcutaneous TID WC  . insulin glargine  8 Units Subcutaneous BID  . lanthanum  1,000 mg Oral TID WC  . levothyroxine  100 mcg Oral QAC breakfast  . lisinopril  20 mg Oral BID  . multivitamin  1 tablet Oral QHS   . pantoprazole  40 mg Oral Daily  . senna  1 tablet Oral BID   Studies: Ir Shuntogram/ Fistulagram Right Mod Sed  11/17/2014 CLINICAL DATA: End stage renal disease, decreased flows during hemodialysis FLUOROSCOPY TIME: 2 minutes 18 seconds COMPARISON: None available EXAM: DIALYSIS SHUNTOGRAM VENOUS ANGIOPLASTY ARTERIAL ANGIOPLASTY TECHNIQUE: The procedure, risks (including but not limited to bleeding, infection, organ damage ), benefits, and alternatives were explained to the patient. Questions regarding the procedure were encouraged and answered. The patient understands and consents to the procedure. An 18-gauge angiocatheter was placed under real-time ultrasound guidance antegrade into the arterial limb of the patient's right femoralAV hemodialysis graft for dialysis fistulography. The angiocatheter and surrounding skin were then prepped with Betadine, draped in usual sterile fashion, infiltrated locally with 1% lidocaine. The angiocatheter was exchanged over a Benson wire for a 6 [place] vascular sheath, through which a 7 mm x 4 cm Conquest angioplasty balloon was advanced to the level of a venous anastomoticstenosis for venous angioplasty using 60 second overlapping inflations at 30 atmospheres. In similar  fashion, the arterial limb of the graft was accessed retrograde with a micropuncture set, exchanged over a Bentson wire for a short 6 French sheath through which a 5 mm x 2 cm Mustang angioplasty balloon was advanced across an arterial anastomotic stenosis for angioplasty of 24 atmospheres. After follow-up venography, the catheter, sheath, and guidewire were removed and hemostasis achieved with a 2-0 Ethilon purse-string suture. No immediate complication. FINDINGS: There is a short segment high-grade stenosis at the venous arterial anastomosis. The graft is patent. There is a high-grade short-segment stenosis of the venous anastomosis. Native venous outflow through the IVC is widely  patent. The arterial anastomotic stenosis responded readily to 5 mm balloon angioplasty. Follow-up should no significant residual or recurrent stenosis, extravasation, dissection, or other complication. The venous anastomotic stenosis responded readily to 7 mm balloon angioplasty. Follow-up venography showed no significant residual or recurrent stenosis, extravasation, dissection, or other complication. IMPRESSION: 1. Tandem arterial and venous anastomotic stenoses, with good response to 5 and 7 mm balloon angioplasty respectively. ACCESS: Remains approachable for percutaneous intervention as needed. Electronically Signed By: Corlis Leak Hassell M.D. On: 11/17/2014 13:51   Dialysis Orders: TTS NW 4 hr 180 2K/2.25ca 60kgs 3000u hep 400/1.5  Aranesp 60 q week hectorol 3 Q hd   Assessment/Plan: 1. Recurrent hypoglycemia / type 1 DM - Incredibly labile - she says it's because "they aren't giving me enough insulin - yet she goes from 400 to 20 without clear reasons and was hypoglycemic at home PTA "doing it all herself". Blood sugars up now over 300-4-- - primary service managing 2. ESRD - HD on TTS. S/p PTA 2/12 of tandem arterial and venous stenoses with good response (done d/t low AF's as outpt). K 5.8 (non-hemolyzed) today for unclear reasons (had HD on Saturday) No exogenous potassium.  Diet is restricted.  BS in 300's andI would think not high enough to cause shift to that extent but maybe.  Recheck renal panel and will decide whether kayexalate vs HD today vs anything needed.   3. HTN/Volume - BP good on 3 BP meds, vol stable 4. Anemia - Last Hgb 12.4, Aranesp 60 mcg on Tuesdays - on hold at this time d/t elev Hb (appears pharmacy discontinued it). 5. Sec HPT - Ca 8.7 (9.3 corrected), P 6.8; Hectorol 3 mcg, Fosrenol 1 g with meals. 6. Nutrition - Alb 3.2, renal carb-mod diet, vitamin. 7. Dispo - SHE SAYS SHE NOW HAS TO MOVE TO [place] WHERE FAMILY IS  LOCATED BECAUSE THE FRIEND SHE HAS BEEN STAYING WITH CAN NO LONGER ALLOW HER TO STAY THERE BECAUSE OF HER MEDICAL ISSUES. SHE WILL HAVE TO HAVE A PLACE AT [facility] - SHE SAYS STARTING Saturday...I HAVE CONTACTED THE SW AT HER OUTPT UNIT TO CLARIFY IF IN FACT A TRANSFER CAN TAKE PLACE THAT QUICKLY.   LOS: 4 days   Luc Shammas B 11/20/2014,10:44 AM

## 2014-11-20 NOTE — Progress Notes (Signed)
Pt sobbing when I told her her meal coverage insulin has been D/C'd. MD text paged to address with pt.

## 2014-11-20 NOTE — Progress Notes (Addendum)
Inpatient Diabetes Program Recommendations  AACE/ADA: New Consensus Statement on Inpatient Glycemic Control (2013)  Target Ranges:  Prepandial:   less than 140 mg/dL      Peak postprandial:   less than 180 mg/dL (1-2 hours)      Critically ill patients:  140 - 180 mg/dL   Reason for Assessment:  Results for [name], Amberlin (MRN [id number]) as of 11/20/2014 11:48  Ref. Range 11/19/2014 19:55 11/19/2014 20:08 11/19/2014 20:26 11/19/2014 20:54 11/19/2014 21:15 11/19/2014 22:07 11/19/2014 22:59 11/19/2014 23:59 11/20/2014 04:36 11/20/2014 05:22 11/20/2014 08:16  Glucose-Capillary Latest Range: 70-99 mg/dL 20 (LL) 213353 (H) 086230 (H) 174 (H) 155 (H) 145 (H) 156 (H) 192 (H) 375 (H) 426 (H) 400 (H)    Diabetes history: Type 1 diabetes Outpatient Diabetes medications: Lantus 8 units bid, Novolog 7 units tid with meals, and Novolog correction 1 units for every 50 mg/dL greater than 578150 mg/dL.  Current orders for Inpatient glycemic control:   Lantus 8 units bid, Novolog sensitive tid with meals and HS scale, Novolog 1 unit tid   Note events of yesterday evening.  Unclear what cause of hypoglycemia was?  May consider reducing Novolog correction to 1 unit for every 50 mg/dL greater than 469150 mg/dL:  [phone] unit, 132-440-1201-250-2 units, 251-300-3 units, 301-350-4 units, 351-400-5 units, greater than 401-call MD.     Very labile CBG's.  Will need Rx. For glucagon at discharge.  ? Continuous glucose monitor as outpatient to assess glucose control/causes of hypoglycemia.  Would benefit from follow-up with endocrinologist.    Beryl MeagerJenny Zalaya Astarita, RN, BC-ADM Inpatient Diabetes Coordinator Pager [phone]

## 2014-11-20 NOTE — Progress Notes (Signed)
Blood sugar 400. MD paged to notify [phone].

## 2014-11-20 NOTE — Progress Notes (Signed)
Utilization review completed.  

## 2014-11-20 NOTE — Discharge Summary (Signed)
Family Medicine Teaching [facility] Discharge Summary  Patient name: [name] Medical record number: [id number] Date of birth: 07/13/1981 Age: 34 y.o. Gender: female Date of Admission: 11/16/2014  Date of Discharge: 11/23/2014  Admitting Physician: Sanjuana LettersWilliam Arthur Hensel, MD  Primary Care Provider: Hazeline JunkerGrunz, Ryan, MD Consultants: Nephrology  Indication for Hospitalization: Hypoglycemia  Discharge Diagnoses/Problem List:  Diabetes Type 1 ESRD  Disposition: Discharge Home  Discharge Condition: Stable  Discharge Exam:  General: awake, alert, well nourished, NAD,lying in bed Cardiovascular: RRR, no murmurs Respiratory: CTAB, no increased WOB Abdomen: soft, ND/NT, +BS Extremities: WWP, no edema Psych: affect flat, speech normal, mood stable. Upset w/ glycemic control. Neuro: blind at baseline, otherwise no focal deficits  Brief Hospital Course:  Mrs. Archie PattenMonroe presented to the Emergency Department on 11/16/14 with severe hypoglycemia with CBG 22 and hypothermia. Multiple recent admissions for DKA and hypoglycemia suspected to be secondary to medication noncompliance, with Mrs. Cordial taking more insulin than instructed or taking it at incorrect times. Glucose given and CBGs improved. Sensitive Sliding Scale initiated at admission. Lantus initiated on 2/12 and titrated up during hospitalization. Psychiatry consulted and determined Mrs. Florio has psychiatric capacity on 2/13. On 2/14, Mrs. Archie PattenMonroe became hypoglycemic to 20 and glucose was given. All insulin except for Lantus was discontinued on 2/15 to establish a simpler insulin regimen in the hopes of avoiding future episodes of hypoglycemia.   Complained of abdominal pain and constipation throughout hospitalization. WBC normal. Lactic acid normal. KUB showed mild to moderate stool burden. Recent CT normal. Colace and Senna used throughout hospitalization. Reglan initiated on 2/15.  Has history of ESRD with dialysis on Tuesday,  Thursday, and Saturday. Nephrology was consulted and dialysis continued throughout hospitalization. Shuntogram normal.  Mrs. Lopes was initially planning to discharge to SNF. On 2/17, she decided instead to be discharged home w/ the care of her sister. Patient plans to move to [place] by the end of the month and states her understanding that she will need to establish w/ a care provider there.  Issues for Follow Up:  1. Insulin regimen. Confusion with medication suspected to be contributing to multiple recent hospitalizations. All insulin discontinued except for Lantus 9 units BID. 1. Extensive discussion about the discontinuation of Novolog was made w/ patient. This is obviously not a normal practice but with patient's recent multiple ED visits due to hypoglycemic states and overall brittle DM1 status it was deemed necessary to increase our threshold for acceptable CGB's as a compromise for keeping the patient out of DKA or hypoglycemia. 2. Consider treatment for depression. Recent history of suicidal ideation.   Significant Procedures: Shuntogram  Significant Labs and Imaging:   Recent Labs Lab 11/18/14 1449 11/21/14 0500 11/22/14 1227  WBC 4.5 4.4 4.1  HGB 12.4 10.7* 10.5*  HCT 35.9* 30.8* 29.6*  PLT 222 200 180    Recent Labs Lab 11/21/14 0500 11/22/14 0523 11/22/14 1227 11/23/14 0624 11/23/14 1100  NA 128* 126* 124* 135 134*  K 4.4 6.6* 4.8 3.9 4.2  CL 93* 92* 90* 100 98  CO2 18* 20 23 27 25   GLUCOSE 366* 619* 622* 257* 322*  BUN 54* 27* 33* 22 24*  CREATININE 12.50* 7.79* 8.81* 6.26* 7.04*  CALCIUM 9.1 8.7 9.0 9.2 9.5  PHOS 6.1* 5.7* 5.1* 5.0* 5.7*  ALBUMIN 3.2* 3.5 3.3* 3.1* 3.4*   - Troponins negative - TSH 14.6 - UDS negative  No results found. Results/Tests Pending at Time of Discharge: none  Discharge Medications:    Medication  List    STOP taking these medications        insulin aspart 100 UNIT/ML injection  Commonly known as:  novoLOG      TAKE  these medications        amLODipine 10 MG tablet  Commonly known as:  NORVASC  Take 10 mg by mouth 2 (two) times daily.     aspirin 81 MG chewable tablet  Chew 1 tablet (81 mg total) by mouth daily.     carvedilol 25 MG tablet  Commonly known as:  COREG  Take 25 mg by mouth every morning.     gabapentin 300 MG capsule  Commonly known as:  NEURONTIN  Take 1 capsule (300 mg total) by mouth daily.     insulin glargine 100 UNIT/ML injection  Commonly known as:  LANTUS  Inject 0.08 mLs (8 Units total) into the skin 2 (two) times daily.     lanthanum 1000 MG chewable tablet  Commonly known as:  FOSRENOL  Chew 1,000 mg by mouth as needed (phosphante).     levothyroxine 100 MCG tablet  Commonly known as:  SYNTHROID, LEVOTHROID  Take 1 tablet by mouth  daily     lisinopril 20 MG tablet  Commonly known as:  PRINIVIL,ZESTRIL  Take 20 mg by mouth 2 (two) times daily.     multivitamin Tabs tablet  Take 1 tablet by mouth at bedtime.     pantoprazole 40 MG tablet  Commonly known as:  PROTONIX  Take 1 tablet (40 mg total) by mouth daily.        Discharge Instructions: Please refer to Patient Instructions section of EMR for full details.  Patient was counseled important signs and symptoms that should prompt return to medical care, changes in medications, dietary instructions, activity restrictions, and follow up appointments.   Follow-Up Appointments: Follow-up Information    Follow up with Hazeline Junker, MD On 12/01/2014.   Specialty:  Family Medicine   Why:  :30am   Contact information:   [address] [place] [address] [phone]       Kathee Delton, MD 11/23/2014, 2:08 PM PGY-1, [facility]

## 2014-11-20 NOTE — Progress Notes (Signed)
In to see patient at this time, fsbs 492. Teaching service notified at this time, will give lantus. No new orders obtained at this time. Will continue to monitor the patient closely.

## 2014-11-20 NOTE — Progress Notes (Signed)
Pt refuses soap suds enema at this time.

## 2014-11-20 NOTE — Progress Notes (Signed)
New Admission Note:   Arrival Method: Wheelchair with cane  Mental Orientation: Alert and oriented #4 Telemetry: N/A Skin: Intact IV: Saline Locked L. Forearm  Pain: No complaints Tubes: none  Safety Measures: Safety Fall Prevention Plan has been given, discussed and signed Admission: Completed [address] Orientation: Patient has been orientated to the room, unit and staff.  Family: none at bedside   Orders have been reviewed and implemented. Will continue to monitor the patient. Call light has been placed within reach and bed alarm has been activated.   Riesa PopeJasmine Deaveon Schoen BSN, RN Phone number: [phone]

## 2014-11-20 NOTE — Progress Notes (Signed)
Pt transferred to 6725 by Mohammed, NT. Phone and charger and cane with pt.

## 2014-11-20 NOTE — Clinical Social Work Note (Signed)
CSW spoke with patient to find out her interest in pursuing ALF as she currently has no offers. She reports to me that she plans to go stay with her father [place] and her sister will pick her up on Thursday.  If she is released from the hospital before that Thursday she plans to go stay at a hotel until her sister comes- she reports plans to use [employer] to get to/from hotel and to get food- "I can cook too at the hotel once I get some food". CSW has RNCM of this plan for further planning. Patient appears excited to be going to stay with/closer to her family- stating her sister's business is near the HD center where she plans to get her dialysis and she will help her to get to her appointments.   Reece LevyJanet Ashon Rosenberg, MSW, Theresia MajorsLCSWA [phone]

## 2014-11-20 NOTE — Progress Notes (Signed)
Family Medicine Teaching Service Daily Progress Note Intern Pager: [phone]  Patient name: [name] Medical record number: [id number] Date of birth: [birth year] Age: 34 y.o. Gender: female  Primary Care Provider: Hazeline Junker, MD Consultants: nephrology  Code Status: FULL  Pt Overview and Major Events to Date:  2/12: shuntogram  Assessment and Plan: Trinika Cortese is a 34 y.o. female presenting with hyperglycemia and hypoglycemia. PMH is significant for type 1 diabetes, diabetic retinopathy/blind, frequent DKA, DVT of upper extremity, end-stage renal disease on HD, hypertension.  #Hypoglycemia/DM type I: Brittle diabetic history. Reported roommate heard seizure noises early this am, EMS called CBG 22, treated in route to ED. ED CBG 59->127. Pt states she has been compliant with medications and using as directed. CBG 147-829 - sensitive SSI, 10 total units of Novolog/24 hours. Lantus 8 units BID  - Needs very simplified outpatient regimen due to multiple recent admissions for DKA and hypoglycemia. Will attempt to transition off of meal time insulin and only on Lantus. Increasing dose of Lantus to 9 units today and will increase as needed during hospitalization. - Carb modified diet - hypoglycemia protocol.   #ESRD on HD- Receives dialysis on Tuesday, Thursday, Saturday. Last dialysis was Tuesday. Cr on admission 11.3, BUN 65. Cr 9.36. K 5.8.  HD Tues - Consult Nephrology, appreciate recs  -Shuntogram normal  #Abdominal pain/constipation: CT on last admission positive for fluid overload, and fatty liver. WBC normal. Appears epigastric in nature. WBC within normal limits, troponin negative, white count normal. Lactic acid normal. - Has had negative renal stone CT, KUB and abdominal U/S in last month. - Will not do additional imaging at this time - Bowel regimen: Colace/Senna daily, suppository PRN, Soap Suds enema today  #HTN: BP 140/72 this am - Home medications include  Amlodipine , Coreg  BID, and Lisinopril.  - Continue Amlodipine, Coreg, and Lisinopril   #Suicidal Ideation- Noted on a hospitalization prior. Not voicing SI but I question her capacity as she was sent home on a regimen that proved to work well for her during last admission and again is coming in with hypoglycemia 2/2 taking too much insulin.  - Continue to monitor throughout hospitalization - c/s Psych given poor insight to over dosing of insulin. Patient has capacity.  # Social: Patient reports that she is a "couch hopper".  She lists her sister's address in [place] as mailing but has no permanent address. Patient no longer agreeable to SNF.  States that she will live with her sister in [place]. -c/s CSW.  Will FYI CSW.  FEN/GI: Carb Modified diet Prophylaxis: Subcutaneous Heparin  Disposition: Discharge pending setup of [place] HD.  Patient will contact them in am.  Discharge 02/15.  Subjective:  No acute complaints overnight. Continues to complain of constipation. Would like additional imaging if possible. Continues to have bowel movements and flatus, but states she doesn't feel like she is emptying completely. Plans to go live with her sister. Will stay in a hotel until Thursday until her sister can pick her up if discharged earlier. No further concerns today.  Objective: Temp:  [93.6 F (34.2 C)-99 F (37.2 C)] 99 F (37.2 C) (02/15 0550) Pulse Rate:  [58-74] 65 (02/15 0550) Resp:  [9-17] 11 (02/15 0550) BP: (125-154)/(68-78) 140/72 mmHg (02/15 0308) SpO2:  [98 %-100 %] 100 % (02/15 0550) Weight:  [131 lb 2.8 oz (59.5 kg)] 131 lb 2.8 oz (59.5 kg) (02/14 2315) Physical Exam: General: awake, alert, well nourished, NAD,lying in bed Cardiovascular:  RRR, no murmurs Respiratory: CTAB, no increased WOB Abdomen: soft, ND/NT, +BS Extremities: WWP, no edema Psych: affect flat, speech normal, mood stable, fixated on constipation Neuro: blind at baseline, otherwise no  focal deficits  Laboratory:  Recent Labs Lab 11/17/14 2259 11/18/14 0900 11/18/14 1449  WBC 5.0 4.7 4.5  HGB 10.9* 10.2* 12.4  HCT 33.0* 29.5* 35.9*  PLT 188 188 222    Recent Labs Lab 11/19/14 1210 11/19/14 1700 11/20/14 0310  NA QUANTITY NOT SUFFICIENT, UNABLE TO PERFORM TEST 132* 131*  K QUANTITY NOT SUFFICIENT, UNABLE TO PERFORM TEST 4.2 5.8*  CL QUANTITY NOT SUFFICIENT, UNABLE TO PERFORM TEST 96 94*  CO2 QUANTITY NOT SUFFICIENT, UNABLE TO PERFORM TEST 23 23  BUN QUANTITY NOT SUFFICIENT, UNABLE TO PERFORM TEST 30* 34*  CREATININE 7.70* 7.91* 9.36*  CALCIUM QUANTITY NOT SUFFICIENT, UNABLE TO PERFORM TEST 9.4 8.8  GLUCOSE 178* 96 370*     Imaging/Diagnostic Tests: No results found.  [address] [place], [place] 11/20/2014, 7:16 AM PGY-1, [facility] FPTS Intern pager: [phone], text pages welcome

## 2014-11-20 NOTE — Progress Notes (Signed)
OT Cancellation Note  Patient Details Name: [name] MRN: [id number] DOB: 05/06/1981   Cancelled Treatment:    Reason Eval/Treat Not Completed: OT screened, no needs identified, will sign off. Pt ambulated 450' with Mod I  (due to her complete blindness) with PT yesterday and reports that she does not foresee any issues with BADLs, that she always manages wherever she is. She has already planned to stay at a hotel near her dialysis center if she has to D/C before Thursday, otherwise her sister will be here to get her to transport her to her father's house [place]. No OT needs identified per pt.  Evette GeorgesLeonard, Kristina Mcnorton Eva [phone] 11/20/2014, 3:05 PM

## 2014-11-21 DIAGNOSIS — Z992 Dependence on renal dialysis: Secondary | ICD-10-CM

## 2014-11-21 DIAGNOSIS — N186 End stage renal disease: Secondary | ICD-10-CM

## 2014-11-21 LAB — RENAL FUNCTION PANEL
ALBUMIN: 3.2 g/dL — AB (ref 3.5–5.2)
Anion gap: 17 — ABNORMAL HIGH (ref 5–15)
BUN: 54 mg/dL — ABNORMAL HIGH (ref 6–23)
CALCIUM: 9.1 mg/dL (ref 8.4–10.5)
CHLORIDE: 93 mmol/L — AB (ref 96–112)
CO2: 18 mmol/L — ABNORMAL LOW (ref 19–32)
CREATININE: 12.5 mg/dL — AB (ref 0.50–1.10)
GFR calc Af Amer: 4 mL/min — ABNORMAL LOW (ref >90)
GFR calc non Af Amer: 4 mL/min — ABNORMAL LOW (ref >90)
Glucose, Bld: 366 mg/dL — ABNORMAL HIGH (ref 70–99)
Phosphorus: 6.1 mg/dL — ABNORMAL HIGH (ref 2.3–4.6)
Potassium: 4.4 mmol/L (ref 3.5–5.1)
SODIUM: 128 mmol/L — AB (ref 135–145)

## 2014-11-21 LAB — GLUCOSE, CAPILLARY
GLUCOSE-CAPILLARY: 110 mg/dL — AB (ref 70–99)
GLUCOSE-CAPILLARY: 124 mg/dL — AB (ref 70–99)
GLUCOSE-CAPILLARY: 403 mg/dL — AB (ref 70–99)
GLUCOSE-CAPILLARY: 481 mg/dL — AB (ref 70–99)
Glucose-Capillary: 242 mg/dL — ABNORMAL HIGH (ref 70–99)
Glucose-Capillary: 410 mg/dL — ABNORMAL HIGH (ref 70–99)

## 2014-11-21 LAB — CBC
HCT: 30.8 % — ABNORMAL LOW (ref 36.0–46.0)
Hemoglobin: 10.7 g/dL — ABNORMAL LOW (ref 12.0–15.0)
MCH: 31.5 pg (ref 26.0–34.0)
MCHC: 34.7 g/dL (ref 30.0–36.0)
MCV: 90.6 fL (ref 78.0–100.0)
PLATELETS: 200 10*3/uL (ref 150–400)
RBC: 3.4 MIL/uL — AB (ref 3.87–5.11)
RDW: 13.7 % (ref 11.5–15.5)
WBC: 4.4 10*3/uL (ref 4.0–10.5)

## 2014-11-21 LAB — HEPATITIS B SURFACE ANTIGEN: HEP B S AG: NEGATIVE

## 2014-11-21 MED ORDER — ACETAMINOPHEN 325 MG PO TABS
ORAL_TABLET | ORAL | Status: AC
  Filled 2014-11-21: qty 2

## 2014-11-21 MED ORDER — DARBEPOETIN ALFA 60 MCG/0.3ML IJ SOSY
60.0000 ug | PREFILLED_SYRINGE | Freq: Once | INTRAMUSCULAR | Status: DC
  Filled 2014-11-21: qty 0.3

## 2014-11-21 MED ORDER — DARBEPOETIN ALFA 60 MCG/0.3ML IJ SOSY: 60.0000 ug | PREFILLED_SYRINGE | INTRAMUSCULAR | Status: DC

## 2014-11-21 MED ORDER — DOXERCALCIFEROL 4 MCG/2ML IV SOLN
INTRAVENOUS | Status: AC
  Administered 2014-11-21: 3 ug
  Filled 2014-11-21: qty 2

## 2014-11-21 MED ORDER — INSULIN GLARGINE 100 UNIT/ML ~~LOC~~ SOLN
12.0000 [IU] | Freq: Every day | SUBCUTANEOUS | Status: DC
  Administered 2014-11-21 – 2014-11-22 (×2): 12 [IU] via SUBCUTANEOUS
  Filled 2014-11-21 (×2): qty 0.12

## 2014-11-21 NOTE — Clinical Social Work Note (Signed)
CSW talked at length regarding an appropriate discharge plan. CSW aware from our last conversation that an Assisted Living Search was being done and patient updated on outcome of search. Patient advised that Medicaid will be needed or she will have to pay privately for an ALF until Medicaid is approved. CSW expressed concern about patient not having a firm d/c plan or going to motel and the possibly of being found unconscious or worse due the nature of her diabetes. [name] reported that she had discussed this with her sister and the plan would be for her sister to call her frequently and then notify the hotel office if she does not answer the phone. The possibility of SNF placement discussed and then applying for LTC Medicaid once admitted to a facility.  Patient also made aware that once deemed long-term with the facility, her check would go the facility with a small portion set aside for her.   Patient is aware that the dialysis center near her father has no available chairs, so moving with her sister was discussed, along with the challenges of not having transportation to get to dialysis in [place]. Patient informed CSW that her sister will be coming to see her on Thursday and her plan is to leave with her, go to [place] and to the hospital there when she needs dialysis. CSW thanked patient for her openness and honesty in our conversations and advised patient that CSW will f/u with her on Wednesday.   [name], MSW, LCSW Licensed Clinical Social Worker Clinical Social Work Department [facility] [phone]

## 2014-11-21 NOTE — Progress Notes (Signed)
**  Interval Note**  CBG 110 this am.  Asked RN to hold Lantus until after she has eaten.  Per primary provider Dr Wende MottMcKeag and attending Dr Deirdre Priesthambliss, will adjust Lantus dose to once daily in order to attempt to simplify regimen.  Lantus 12u starting at 12pm/after she has eaten.  Spoke to Ms Illene LabradorJazmine, RN and voices understanding of new plan.  Ashly M. Nadine CountsGottschalk, DO PGY-1, [facility]

## 2014-11-21 NOTE — Progress Notes (Signed)
Update on availability of dialysis in [place] for [place]. The [facility] to where she would be proposing to live [facility] and they are not accepting patients. The 2 next closest units are [place], both in [place] and 20[place] miles away, There is no transportation availablelfor either of those. Would appear that patient will have to remain in [place]. CSW aware and trying to work out something for her (she can't return to her current living situation)  Camille Balynthia Lizzeth Meder, MD [facility] [phone] Pager 11/21/2014, 3:08 PM

## 2014-11-21 NOTE — Progress Notes (Addendum)
  Inpatient Diabetes Program Recommendations  Basal: Pt takes 8 units basal bid at home. Agree to change in dosage to 12 units daily to start  Correction (SSI): Pt will need some low dose correction insulin. With renal status, would recommend a custom scale that begins at 150 mg/dL. At home pt takes 1 unit for every 50 mg/dL over 161150 mg/dl (her goal/target glucose.  Have reviewed glucose patterns for past 3 days. Considering her glucose goal is 150 mg/dl, we might want to use a correction scale custom to her needs.  Thank you Lenor CoffinAnn Clydell Alberts, RN, MSN, CDE  Diabetes Inpatient Program Office: [phone] Pager: [phone] 8:00 am to 5:00 pm

## 2014-11-21 NOTE — Progress Notes (Signed)
Family Medicine Teaching Service Daily Progress Note Intern Pager: [phone]  Patient name: [name] Medical record number: [id number] Date of birth: July 22, 1981 Age: 34 y.o. Gender: female  Primary Care Provider: Hazeline Junker, MD Consultants: nephrology  Code Status: FULL  Pt Overview and Major Events to Date:  2/12: shuntogram  Assessment and Plan: [name] is a 34 y.o. female presenting with hyperglycemia and hypoglycemia. PMH is significant for type 1 diabetes, diabetic retinopathy/blind, frequent DKA, DVT of upper extremity, end-stage renal disease on HD, hypertension.  #Hypoglycemia/DM type I: Brittle diabetic history. Reported roommate heard seizure noises early this am, EMS called CBG 22, treated in route to ED. ED CBG 59->127. Pt states she has been compliant with medications and using as directed. CBG 400's 2/15 - DCd SSI, Lantus 12 units QD  - Needs very simplified outpatient regimen due to multiple recent admissions for DKA and hypoglycemia.   - We will be continuing glycemic control w/ only Lantus for now. Most recent CGBs 110 and 124. Will trend. - Carb modified diet - hypoglycemia protocol.   #ESRD on HD- Receives dialysis on Tuesday, Thursday, Saturday. Last dialysis was Tuesday. Cr on admission 11.3, BUN 65. Cr 9.36. K 5.8.  HD Tues (today) - Consult Nephrology, appreciate recs  -Shuntogram normal  #Abdominal pain/constipation: CT on last admission positive for fluid overload, and fatty liver. WBC normal. Appears epigastric in nature. WBC within normal limits, troponin negative, white count normal. Lactic acid normal. - Has had negative renal stone CT, KUB and abdominal U/S in last month. - Will not do additional imaging at this time - Bowel regimen: Colace/Senna daily, suppository PRN, Soap Suds enema 2/15  #HTN: BP 120/73 - Home medications include Amlodipine , Coreg  BID, and Lisinopril.  - Continue Amlodipine, Coreg, and Lisinopril    #Suicidal Ideation- Noted on a hospitalization prior. Not voicing SI but I question her capacity as she was sent home on a regimen that proved to work well for her during last admission and again is coming in with hypoglycemia 2/2 taking too much insulin.  - Continue to monitor throughout hospitalization - c/s Psych given poor insight to over dosing of insulin. Patient has capacity.  # Social: Patient reports that she is a "couch hopper".  She lists her sister's address in [place] as mailing but has no permanent address. Patient no longer agreeable to SNF.  States that she will live with her sister in [place]. -c/s CSW.  Will FYI CSW.  FEN/GI: Carb Modified diet Prophylaxis: Subcutaneous Heparin  Disposition: Discharge pending setup of [place] [facility].  Patient will contact them in am.  Discharge 02/15.  Subjective:  Very upset about DCing Novolog yesterday. She and I discussed the benefits of this new regimen. Patient was not in agreement with this but will continue our treatment course. Possible plans to go live with her sister. We will attempt to set patient up at care facility if possible. No further concerns today.  Objective: Temp:  [97.6 F (36.4 C)-99 F (37.2 C)] 97.6 F (36.4 C) (02/16 0640) Pulse Rate:  [68-77] 70 (02/16 0930) Resp:  [14-18] 16 (02/16 0640) BP: (120-177)/(68-94) 147/80 mmHg (02/16 0930) SpO2:  [99 %-100 %] 99 % (02/16 0640) Weight:  [133 lb 3.2 oz (60.419 kg)-134 lb 4.2 oz (60.9 kg)] 134 lb 4.2 oz (60.9 kg) (02/16 0640) Physical Exam: General: awake, alert, well nourished, NAD,lying in bed Cardiovascular: RRR, no murmurs Respiratory: CTAB, no increased WOB Abdomen: soft, ND/NT, +BS Extremities: WWP,  no edema Psych: affect flat, speech normal, mood stable. Upset w/ glycemic control. Neuro: blind at baseline, otherwise no focal deficits  Laboratory:  Recent Labs Lab 11/18/14 0900 11/18/14 1449 11/21/14 0500  WBC 4.7 4.5 4.4  HGB 10.2* 12.4  10.7*  HCT 29.5* 35.9* 30.8*  PLT 188 222 200    Recent Labs Lab 11/20/14 0310 11/20/14 1950 11/21/14 0500  NA 131* 129* 128*  K 5.8* 5.4* 4.4  CL 94* 93* 93*  CO2 23 20 18*  BUN 34* 47* 54*  CREATININE 9.36* 11.60* 12.50*  CALCIUM 8.8 9.5 9.1  GLUCOSE 370* 386* 366*     Imaging/Diagnostic Tests: No results found.  Kathee DeltonIan D McKeag, MD 11/21/2014, 9:45 AM PGY-1, [facility] FPTS Intern pager: [phone], text pages welcome

## 2014-11-21 NOTE — Progress Notes (Signed)
[facility] Rounding Note Subjective:    On dialysis Complaints about her insulin management Says she is going to live in [place] after discharge [facility] [place] where she will stay) but no arrangements have been made  Objective: Vital signs in last 24 hours: Temp:  [97.6 F (36.4 C)-99 F (37.2 C)] 97.6 F (36.4 C) (02/16 0640) Pulse Rate:  [68-77] 73 (02/16 0700) Resp:  [14-18] 16 (02/16 0640) BP: (133-177)/(68-93) 148/92 mmHg (02/16 0700) SpO2:  [99 %-100 %] 99 % (02/16 0640) Weight:  [60.419 kg (133 lb 3.2 oz)-60.9 kg (134 lb 4.2 oz)] 60.9 kg (134 lb 4.2 oz) (02/16 0640) Weight change: 0.919 kg (2 lb 0.4 oz)     EXAM: General appearance:Sleepy, feels fatigued  Resp: Lungs clear Cardio: Regular. Normal S1S2 No S3.  GI: + BS, soft and nontender Extremities: No edema of the LE's Access: R thigh AVG cannulated for HD   TODAY'S LABS PENDING  Recent Labs  11/18/14 0900 11/18/14 1449  WBC 4.7 4.5  HGB 10.2* 12.4  HCT 29.5* 35.9*  PLT 188 222   CBG (last 3)   Recent Labs  11/20/14 2050 11/21/14 0024 11/21/14 0429  GLUCAP 492* 481* 403*    Recent Labs  11/20/14 0310 11/20/14 1950  NA 131* 129*  K 5.8* 5.4*  CL 94* 93*  CO2 23 20  GLUCOSE 370* 386*  BUN 34* 47*  CREATININE 9.36* 11.60*  CALCIUM 8.8 9.5  ALBUMIN  --  4.0   No results for input(s): PTH in the last 72 hours.  Medications . amLODipine  10 mg Oral BID  . aspirin  81 mg Oral Daily  . carvedilol  25 mg Oral q morning - 10a  . docusate sodium  100 mg Oral BID  . doxercalciferol  3 mcg Intravenous Q T,Th,Sa-HD  . heparin  5,000 Units Subcutaneous 3 times per day  . insulin glargine  9 Units Subcutaneous BID  . lanthanum  1,000 mg Oral TID WC  . levothyroxine  100 mcg Oral QAC breakfast  . lisinopril  20 mg Oral BID  . metoCLOPramide  10 mg Oral TID AC  . multivitamin  1 tablet Oral QHS  . pantoprazole  40 mg Oral Daily  .  senna  1 tablet Oral BID   Studies: Ir Shuntogram/ Fistulagram Right Mod Sed  11/17/2014 CLINICAL DATA: End stage renal disease, decreased flows during hemodialysis FLUOROSCOPY TIME: 2 minutes 18 seconds COMPARISON: None available EXAM: DIALYSIS SHUNTOGRAM VENOUS ANGIOPLASTY ARTERIAL ANGIOPLASTY TECHNIQUE: The procedure, risks (including but not limited to bleeding, infection, organ damage ), benefits, and alternatives were explained to the patient. Questions regarding the procedure were encouraged and answered. The patient understands and consents to the procedure. An 18-gauge angiocatheter was placed under real-time ultrasound guidance antegrade into the arterial limb of the patient's right femoralAV hemodialysis graft for dialysis fistulography. The angiocatheter and surrounding skin were then prepped with Betadine, draped in usual sterile fashion, infiltrated locally with 1% lidocaine. The angiocatheter was exchanged over a Benson wire for a 6 [place]French vascular sheath, through which a 7 mm x 4 cm Conquest angioplasty balloon was advanced to the level of a venous anastomoticstenosis for venous angioplasty using 60 second overlapping inflations at 30 atmospheres. In similar fashion, the arterial limb of the graft was accessed retrograde with a micropuncture set, exchanged over a Bentson wire for a short 6 French sheath through which a 5 mm x 2 cm  Mustang angioplasty balloon was advanced across an arterial anastomotic stenosis for angioplasty of 24 atmospheres. After follow-up venography, the catheter, sheath, and guidewire were removed and hemostasis achieved with a 2-0 Ethilon purse-string suture. No immediate complication. FINDINGS: There is a short segment high-grade stenosis at the venous arterial anastomosis. The graft is patent. There is a high-grade short-segment stenosis of the venous anastomosis. Native venous outflow through the IVC is widely patent. The arterial anastomotic stenosis  responded readily to 5 mm balloon angioplasty. Follow-up should no significant residual or recurrent stenosis, extravasation, dissection, or other complication. The venous anastomotic stenosis responded readily to 7 mm balloon angioplasty. Follow-up venography showed no significant residual or recurrent stenosis, extravasation, dissection, or other complication. IMPRESSION: 1. Tandem arterial and venous anastomotic stenoses, with good response to 5 and 7 mm balloon angioplasty respectively. ACCESS: Remains approachable for percutaneous intervention as needed. Electronically Signed By: Corlis Leak M.D. On: 11/17/2014 13:51   Dialysis Orders: TTS NW 4 hr 180 2K/2.25ca 60kgs 3000u hep 400/1.5  Aranesp 60 q week hectorol 3 Q hd   Assessment/Plan: 1. Recurrent hypoglycemia / type 1 DM - Incredibly labile - blood sugars remain suboptimal. Primary service managing. 2. ESRD - HD on TTS. S/p PTA 2/12 of tandem arterial and venous stenoses with good response (done d/t low AF's as outpt). 3. HTN/Volume - BP good on 3 BP meds, vol stable 4. Anemia - Last Hgb 12.4, Aranesp 60 mcg on Tuesdays - on hold at this time d/t elev Hb (appears pharmacy discontinued it). 5. Sec HPT -  Hectorol 3 mcg, Fosrenol 1 g with meals. 6. Nutrition - Alb 3.2, renal carb-mod diet, vitamin. 7. Dispo - SHE SAYS SHE NOW HAS TO MOVE [place] WHERE FAMILY IS LOCATED BECAUSE THE FRIEND SHE HAS BEEN STAYING WITH CAN NO LONGER ALLOW HER TO STAY THERE BECAUSE OF HER MEDICAL ISSUES. SHE NEEDS TO BE CLOSER TO FAMILY.  THEY LIVE IN [place].  SHE WILL HAVE TO HAVE A PLACE AT THE DIALYSIS UNIT AT [facility] - SHE SAYS STARTING Saturday...we are contacting the unit there - she will have to be accepted by their Nephrology Group before such transfer can be made. Would like to get CSW involved here.   LOS: 5 days   Beverley Sherrard B 11/21/2014,7:28 AM

## 2014-11-21 NOTE — Progress Notes (Signed)
Informed MD of BS 403 this am, no new orders obtained at this time. Will continue to monitor the patient closely.

## 2014-11-21 NOTE — Procedures (Signed)
I have personally attended this patient's dialysis session.   2K bath (K pending - at last check was 5.4) UF goal 1300 Thigh AVG 400 no issues with cannulation  Susan Balynthia Haevyn Ury, MD [facility] [phone] Pager 11/21/2014, 7:26 AM

## 2014-11-21 NOTE — Progress Notes (Signed)
Received phone call from CM at patient HD clinic. Requested that we draw STAT hep b labs as this is needed for pt to change clinics. CM said MD aware.

## 2014-11-21 NOTE — Progress Notes (Signed)
11/21/2014 11:49 AM Hemodialysis Outpatient Note; patient stated she was going to a new dialysis center [facility] and would not be returning to [place]. When we called [facility] to confirm placement they said they were not accepting any patients at this time. I will inform [name] and see what she would like to do. Thank you. Tilman NeatKrzywonos, Keiffer Piper H

## 2014-11-22 LAB — CULTURE, BLOOD (ROUTINE X 2)
Culture: NO GROWTH
Culture: NO GROWTH

## 2014-11-22 LAB — RENAL FUNCTION PANEL
ALBUMIN: 3.5 g/dL (ref 3.5–5.2)
ALBUMIN: 3.3 g/dL — AB (ref 3.5–5.2)
Anion gap: 14 (ref 5–15)
Anion gap: 11 (ref 5–15)
BUN: 27 mg/dL — ABNORMAL HIGH (ref 6–23)
BUN: 33 mg/dL — ABNORMAL HIGH (ref 6–23)
CO2: 20 mmol/L (ref 19–32)
CO2: 23 mmol/L (ref 19–32)
CREATININE: 8.81 mg/dL — AB (ref 0.50–1.10)
Calcium: 8.7 mg/dL (ref 8.4–10.5)
Calcium: 9 mg/dL (ref 8.4–10.5)
Chloride: 92 mmol/L — ABNORMAL LOW (ref 96–112)
Chloride: 90 mmol/L — ABNORMAL LOW (ref 96–112)
Creatinine, Ser: 7.79 mg/dL — ABNORMAL HIGH (ref 0.50–1.10)
GFR calc Af Amer: 7 mL/min — ABNORMAL LOW (ref >90)
GFR calc non Af Amer: 6 mL/min — ABNORMAL LOW (ref >90)
GFR, EST AFRICAN AMERICAN: 6 mL/min — AB (ref >90)
GFR, EST NON AFRICAN AMERICAN: 5 mL/min — AB (ref >90)
Glucose, Bld: 619 mg/dL (ref 70–99)
Glucose, Bld: 622 mg/dL (ref 70–99)
PHOSPHORUS: 5.7 mg/dL — AB (ref 2.3–4.6)
PHOSPHORUS: 5.1 mg/dL — AB (ref 2.3–4.6)
Potassium: 6.6 mmol/L (ref 3.5–5.1)
Potassium: 4.8 mmol/L (ref 3.5–5.1)
SODIUM: 126 mmol/L — AB (ref 135–145)
SODIUM: 124 mmol/L — AB (ref 135–145)

## 2014-11-22 LAB — GLUCOSE, CAPILLARY
GLUCOSE-CAPILLARY: 580 mg/dL — AB (ref 70–99)
GLUCOSE-CAPILLARY: 563 mg/dL — AB (ref 70–99)
Glucose-Capillary: 495 mg/dL — ABNORMAL HIGH (ref 70–99)
Glucose-Capillary: 491 mg/dL — ABNORMAL HIGH (ref 70–99)
Glucose-Capillary: 149 mg/dL — ABNORMAL HIGH (ref 70–99)
Glucose-Capillary: 278 mg/dL — ABNORMAL HIGH (ref 70–99)

## 2014-11-22 LAB — CBC
HCT: 29.6 % — ABNORMAL LOW (ref 36.0–46.0)
Hemoglobin: 10.5 g/dL — ABNORMAL LOW (ref 12.0–15.0)
MCH: 31.4 pg (ref 26.0–34.0)
MCHC: 35.5 g/dL (ref 30.0–36.0)
MCV: 88.6 fL (ref 78.0–100.0)
PLATELETS: 180 10*3/uL (ref 150–400)
RBC: 3.34 MIL/uL — ABNORMAL LOW (ref 3.87–5.11)
RDW: 13.3 % (ref 11.5–15.5)
WBC: 4.1 10*3/uL (ref 4.0–10.5)

## 2014-11-22 LAB — HEPATITIS B SURFACE ANTIBODY,QUALITATIVE: HEP B S AB: REACTIVE

## 2014-11-22 MED ORDER — NEPRO/CARBSTEADY PO LIQD: 237.0000 mL | ORAL | Status: DC | PRN

## 2014-11-22 MED ORDER — ALTEPLASE 2 MG IJ SOLR: 2.0000 mg | Freq: Once | INTRAMUSCULAR | Status: DC | PRN

## 2014-11-22 MED ORDER — SODIUM CHLORIDE 0.9 % IV SOLN: 100.0000 mL | INTRAVENOUS | Status: DC | PRN

## 2014-11-22 MED ORDER — INSULIN GLARGINE 100 UNIT/ML ~~LOC~~ SOLN
8.0000 [IU] | Freq: Once | SUBCUTANEOUS | Status: DC
  Filled 2014-11-22: qty 0.08

## 2014-11-22 MED ORDER — PENTAFLUOROPROP-TETRAFLUOROETH EX AERO: 1.0000 "application " | INHALATION_SPRAY | CUTANEOUS | Status: DC | PRN

## 2014-11-22 MED ORDER — HEPARIN SODIUM (PORCINE) 1000 UNIT/ML DIALYSIS
3000.0000 [IU] | INTRAMUSCULAR | Status: DC | PRN
  Filled 2014-11-22: qty 3

## 2014-11-22 MED ORDER — INSULIN GLARGINE 100 UNIT/ML ~~LOC~~ SOLN
8.0000 [IU] | Freq: Once | SUBCUTANEOUS | Status: AC
  Administered 2014-11-22: 8 [IU] via SUBCUTANEOUS
  Filled 2014-11-22: qty 0.08

## 2014-11-22 MED ORDER — LIDOCAINE-PRILOCAINE 2.5-2.5 % EX CREA: 1.0000 "application " | TOPICAL_CREAM | CUTANEOUS | Status: DC | PRN

## 2014-11-22 MED ORDER — INSULIN ASPART 100 UNIT/ML ~~LOC~~ SOLN
4.0000 [IU] | Freq: Once | SUBCUTANEOUS | Status: AC
  Administered 2014-11-22: 4 [IU] via SUBCUTANEOUS

## 2014-11-22 MED ORDER — LIDOCAINE HCL (PF) 1 % IJ SOLN: 5.0000 mL | INTRAMUSCULAR | Status: DC | PRN

## 2014-11-22 MED ORDER — INSULIN GLARGINE 100 UNIT/ML ~~LOC~~ SOLN
9.0000 [IU] | Freq: Two times a day (BID) | SUBCUTANEOUS | Status: DC
  Administered 2014-11-23: 9 [IU] via SUBCUTANEOUS
  Filled 2014-11-22 (×2): qty 0.09

## 2014-11-22 MED ORDER — INSULIN GLARGINE 100 UNIT/ML ~~LOC~~ SOLN: 8.0000 [IU] | Freq: Two times a day (BID) | SUBCUTANEOUS | Status: DC

## 2014-11-22 MED ORDER — INSULIN ASPART 100 UNIT/ML ~~LOC~~ SOLN: 6.0000 [IU] | Freq: Once | SUBCUTANEOUS | Status: DC

## 2014-11-22 MED ORDER — HEPARIN SODIUM (PORCINE) 1000 UNIT/ML DIALYSIS: 1000.0000 [IU] | INTRAMUSCULAR | Status: DC | PRN

## 2014-11-22 NOTE — Progress Notes (Signed)
Dr. Jaquita RectorMelancon and Nephrology PA bedside to evaluate pt. Both aware of pt's current BG of 491. New orders for pt to have HD today.

## 2014-11-22 NOTE — Progress Notes (Signed)
Patient had a CBG of 410 at bedtime.  Called Md and made aware.  No orders received at that time.  Checked CBG at 0200 and CBG was 495.  CBG was 619 this am and K+ was 6.6.  Called MD and made aware.  Per MD order, gave 4 Units of Novolog SQ.  Will continue to monitor.  Bernie CoveyKimberly Peggi Yono RN-BC, [employer]

## 2014-11-22 NOTE — Progress Notes (Signed)
[facility] Rounding Note  Subjective:  Mild dyspnea, no other complaints, but upset that glucose is high, states that her insulin was not given appropriately   Objective: Vital signs in last 24 hours: Temp:  [98 F (36.7 C)-99.2 F (37.3 C)] 98 F (36.7 C) (02/17 0500) Pulse Rate:  [67-73] 67 (02/17 0500) Resp:  [17-18] 17 (02/17 0500) BP: (122-148)/(59-84) 124/59 mmHg (02/17 0500) SpO2:  [98 %-100 %] 100 % (02/17 0500) Weight:  [58 kg (127 lb 13.9 oz)-59.2 kg (130 lb 8.2 oz)] 59.2 kg (130 lb 8.2 oz) (02/16 2112) Weight change: -2.419 kg (-5 lb 5.3 oz)  Intake/Output from previous day: 02/16 0701 - 02/17 0700 In: 1080 [P.O.:1080] Out: 1300  Intake/Output this shift:    EXAM: General appearance: Alert, in no apparent distress Resp: CTA without rales, rhonchi, or wheezes Cardio: RRR without murmur or rub GI: + BS, soft and nontender Extremities: No edema  Access: R thigh AVG with + bruit    Lab Results:  Recent Labs  11/21/14 0500  WBC 4.4  HGB 10.7*  HCT 30.8*  PLT 200   BMET:  Recent Labs  11/21/14 0500 11/22/14 0523  NA 128* 126*  K 4.4 6.6*  CL 93* 92*  CO2 18* 20  GLUCOSE 366* 619*  BUN 54* 27*  CREATININE 12.50* 7.79*  CALCIUM 9.1 8.7  ALBUMIN 3.2* 3.5    Medications   . amLODipine  10 mg Oral BID  . aspirin  81 mg Oral Daily  . carvedilol  25 mg Oral q morning - 10a  . darbepoetin (ARANESP) injection - NON-DIALYSIS  60 mcg Subcutaneous Once  . [START ON 11/28/2014] darbepoetin (ARANESP) injection - DIALYSIS  60 mcg Intravenous Q Tue-HD  . docusate sodium  100 mg Oral BID  . doxercalciferol  3 mcg Intravenous Q T,Th,Sa-HD  . heparin  5,000 Units Subcutaneous 3 times per day  . insulin glargine  12 Units Subcutaneous Daily  . lanthanum  1,000 mg Oral TID WC  . levothyroxine  100 mcg Oral QAC breakfast  . lisinopril  20 mg Oral BID  . metoCLOPramide  10 mg Oral TID AC  . multivitamin  1 tablet Oral QHS  . pantoprazole   40 mg Oral Daily  . senna  1 tablet Oral BID   Dialysis Orders: T[facility] 4 hr 180 2K/2.25ca 60kgs 3000u hep 400/1.5  Aranesp 60 q week hectorol 3 Q hd  Assessment/Plan: 1. Recurrent hypoglycemia / DM Type 1 - glucose 580 this AM, very labile, per primary. 2. Hyperkalemia - K up to 6.6, HD yesterday. HD today for hyperkalemia (though getting BS down would be most effective 3. ESRD - HD on T[facility] @ [facility], s/p shuntogram with PTA per IR 2/12. Extra HD today for hyperkalemia. 4. HTN/Volume - BP 124/59 on Amlodipine 10 mg qd, Lisinopril 20 bid, Carvedilol 25 mg qd; wt 59.2 kg s/p 1.3 L yesterday, below EDW. 5. Anemia - Hgb 10.7, Aranesp 60 mcg on Tues. 6. Sec HPT - Ca 8.7 (9.1 corrected), P 5.7; Hectorol 3 mcg, Fosrenol 1 g with meals. 7. Nutrition - Alb 3.5, renal carb-mod diet, vitamin.  8. Dispo - working on acceptance at dialysis unit at [facility]. As it now appears that [facility] may possibly take her.   LOS: 6 days   LYLES,CHARLES 11/22/2014,8:52 AM  I have seen and examined this patient and agree with plan as outlined in the above note with highlighted additions. Recurrent hyperkalemia, presumably as  a result of her uncontrolled DM, but with a K of 6.6 extra dialysis indicated.  3 hours today. The [facility] unit MAY now be able to take her for outpt HD - we are trying to get confirmation of that.   Joshawa Dubin B,MD 11/22/2014 11:57 AM

## 2014-11-22 NOTE — Clinical Social Work Note (Signed)
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 Per [name], when this information was presented to patient, [name] rejected this plan. Her plan is to go stay with her sister in [place], [place] and have dialysis at a center there (she provided [name] with the zip code for the HD center). [name] plans to stay with her sister briefly, then move to [place]. CSW will follow up with patient on 2/19.   [name], MSW, LCSW Licensed Clinical Social Worker Clinical Social Work Department [facility] [phone]

## 2014-11-22 NOTE — Progress Notes (Signed)
New orders recevied to recheck BG in one hour. Will continue to monitor closely.

## 2014-11-22 NOTE — Procedures (Signed)
Patient was seen on dialysis and the procedure was supervised.  BFR 400  Via AVG BP is  156/83.   Patient appears to be tolerating treatment well  Aariona Momon A 11/22/2014

## 2014-11-22 NOTE — Discharge Instructions (Signed)

## 2014-11-22 NOTE — Progress Notes (Signed)
Report given to staff RN, Ty.

## 2014-11-22 NOTE — Progress Notes (Signed)
RN called HD to advise of pt's current BG and K level. HD aware will take to work pt into schedule as soon as possible. Will continue to monitor pt closely.

## 2014-11-22 NOTE — Progress Notes (Signed)
Dr. Jaquita RectorMelancon notified of pt's BG of 580 and K of 6.6. No new orders received at this time. Will continue to monitor closely.

## 2014-11-22 NOTE — Progress Notes (Signed)
Family Medicine Teaching Service Daily Progress Note Intern Pager: [phone]  Patient name: Susan Rossettiameka Gastineau Medical record number: [id number] Date of birth: 05/28/1981 Age: 34 y.o. Gender: female  Primary Care Provider: Hazeline JunkerGrunz, Ryan, MD Consultants: nephrology  Code Status: FULL  Pt Overview and Major Events to Date:  2/12: shuntogram  Assessment and Plan: [name] is a 34 y.o. female presenting with hyperglycemia and hypoglycemia. PMH is significant for type 1 diabetes, diabetic retinopathy/blind, frequent DKA, DVT of upper extremity, end-stage renal disease on HD, hypertension.  #Hypoglycemia/DM type I: Brittle diabetic history. Reported roommate heard seizure noises early this am, EMS called CBG 22, treated in route to ED. ED CBG 59->127. Pt states she has been compliant with medications and using as directed. CBG 400's 2/15 - DCd SSI >> Lantus 12U QD DCd >> Lantus 9U BID  - Needs very simplified outpatient regimen due to multiple recent admissions for DKA and hypoglycemia.   - We will be continuing glycemic control w/ only Lantus for now. Patient did not tolerate Lantus QD well. CBGs in 400-500's. Patient may metabolize Lantus at an increased rate. Placing back on Lantus BID.   - Although Patient is not a typical candidate for insulin pump; consideration for its indication is deemed warranted by this provider. Due to patient's brittle-natured DM1 and her obsession for CBGs being low rather than high--a pump may allow patient to administer insulin in unit fractions rather than large boluses, while maintaining a baseline insulin administration. I would not normally consider patient as a candidate for pump use, however patient's continued uncontrolled status and multiple attempts to obtain a function regimen continues to be a unobtained goal.  - Carb modified diet - hypoglycemia protocol.   #ESRD on HD- Receives dialysis on Tuesday, Thursday, Saturday. Last dialysis was Tuesday. Cr on  admission 11.3, BUN 65. Cr 9.36. K 5.8. - Consult Nephrology, appreciate recs  -Shuntogram normal - K 6.6 in AM of 2/17 -- renal has seen patient and ordered another round of HD.  #Abdominal pain/constipation: CT on last admission positive for fluid overload, and fatty liver. WBC normal. Appears epigastric in nature. WBC within normal limits, troponin negative, white count normal. Lactic acid normal. - Has had negative renal stone CT, KUB and abdominal U/S in last month. - Bowel regimen: Colace/Senna daily, suppository PRN, Soap Suds enema 2/15 - BM on 2/16  #HTN: BP 120/73 - Home medications include Amlodipine 10mg , Coreg 25mg  BID, and Lisinopril.  - Continue Amlodipine, Coreg, and Lisinopril   #Suicidal Ideation- Noted on a hospitalization prior. Not voicing SI but I question her capacity as she was sent home on a regimen that proved to work well for her during last admission and again is coming in with hypoglycemia 2/2 taking too much insulin.  - Continue to monitor throughout hospitalization - c/s Psych given poor insight to over dosing of insulin. Patient has capacity.  # Social: Patient reports that she is a "couch hopper".  She lists her sister's address in [place] as mailing but has no permanent address. Patient no longer agreeable to SNF.  States that she will live with her sister in [place]. -c/s CSW.  Will FYI CSW.  FEN/GI: Carb Modified diet Prophylaxis: Subcutaneous Heparin  Disposition: Discharge pending setup of [place] HD.  Patient will contact them in am.  Discharge 02/15.  Subjective:  Patient continues to feel unwell. Has had a BM and felt some relief. Would like to have her glycemic status under control. No new issue  to report.   Objective: Temp:  [98 F (36.7 C)-99.2 F (37.3 C)] 98 F (36.7 C) (02/17 0500) Pulse Rate:  [67-73] 67 (02/17 0500) Resp:  [17-18] 17 (02/17 0500) BP: (122-148)/(59-84) 124/59 mmHg (02/17 0500) SpO2:  [98 %-100  %] 100 % (02/17 0500) Weight:  [127 lb 13.9 oz (58 kg)-130 lb 8.2 oz (59.2 kg)] 130 lb 8.2 oz (59.2 kg) (02/16 2112) Physical Exam: General: awake, alert, well nourished, NAD,lying in bed Cardiovascular: RRR, no murmurs Respiratory: CTAB, no increased WOB Abdomen: soft, ND/NT, +BS Extremities: WWP, no edema Psych: affect flat, speech normal, mood stable. Upset w/ glycemic control. Neuro: blind at baseline, otherwise no focal deficits  Laboratory:  Recent Labs Lab 11/18/14 0900 11/18/14 1449 11/21/14 0500  WBC 4.7 4.5 4.4  HGB 10.2* 12.4 10.7*  HCT 29.5* 35.9* 30.8*  PLT 188 222 200    Recent Labs Lab 11/20/14 1950 11/21/14 0500 11/22/14 0523  NA 129* 128* 126*  K 5.4* 4.4 6.6*  CL 93* 93* 92*  CO2 20 18* 20  BUN 47* 54* 27*  CREATININE 11.60* 12.50* 7.79*  CALCIUM 9.5 9.1 8.7  GLUCOSE 386* 366* 619*     Imaging/Diagnostic Tests: No results found.  Kathee Delton, MD 11/22/2014, 8:54 AM PGY-1, [place] FPTS Intern pager: [phone], text pages welcome

## 2014-11-22 NOTE — Progress Notes (Signed)
11/22/2014 5:15 PM Update on Hemodialysis Outpatient Schedule: Susan Archie PattenMonroe states that this schedule at [facility] is not acceptable, her plan now is to go to her sister's place, have dialysis at a center in [place] as a transient for two weeks then move to [place]. That's all I know at this time. Tilman NeatKrzywonos, Zahirah Cheslock H

## 2014-11-22 NOTE — Progress Notes (Signed)
11/22/2014 4:40 PM Hemodialysis Outpatient Note; I have received a [facility] schedule at [facility]. Her schedule will be Tuesday, Thursday and Saturday 1st shift schedule. The center could begin treatment on Thursday 2.18.16 at 6AM. Thank you. Tilman NeatKrzywonos, Cinderella Christoffersen H

## 2014-11-22 NOTE — Progress Notes (Signed)
Inpatient Diabetes Program Recommendations  AACE/ADA: New Consensus Statement on Inpatient Glycemic Control (2013)  Target Ranges:  Prepandial:   less than 140 mg/dL      Peak postprandial:   less than 180 mg/dL (1-2 hours)      Critically ill patients:  140 - 180 mg/dL   Results for Susan RossettiMONROE, Fathima (MRN [id number]) as of 11/22/2014 12:54  Ref. Range 11/21/2014 00:24 11/21/2014 04:29 11/21/2014 10:38 11/21/2014 11:53 11/21/2014 16:53 11/21/2014 21:10 11/22/2014 02:37 11/22/2014 07:54 11/22/2014 09:08 11/22/2014 10:56  Glucose-Capillary Latest Range: 70-99 mg/dL 409481 (H) 811403 (H) 914110 (H) 124 (H) 242 (H) 410 (H) 495 (H) 580 (HH) 491 (H) 563 (HH)   Diabetes history: Type 1 diabetes Outpatient Diabetes medications: Lantus 8 units bid, Novolog 7 units tid with meals, and Novolog correction 1 units for every 50 mg/dL greater than 782150 mg/dL.  Current orders for Inpatient glycemic control: Lantus 12 units daily, one time Lantus 8 units at 8pm for 2/17  Inpatient Diabetes Program Recommendations Insulin - IV drip/GlucoStabilizer: If glucose continues to widely fluctuate, may want to consider placing patient on IV insulin for at least 24 hours to determine actual insulin needs. Insulin - Basal: Patient received Lantus 12 units on 2/16 for basal needs. Noted patient received Lantus 12 units this morning and will receive one time dose of Lantus 8 units at 8pm tonight.  Correction (SSI): Patient has Type 1 diabetes and makes no insulin. Patient needs correction and meal coverage insulin in addition to basal insulin. Please consider ordering Novolog custom scale with correction starting at 150 mg/dl in which 1 unit for every 50 mg/dl over target of 956150 mg/d. Custom scale would be as follows: Novolog 0-5 units TID with meals (151-200-1 unit, 201-250-2 units, 251-300-3 units, 301-350-4 units, 351-400-5 units, greater than 401-call MD.)  Insulin - Meal Coverage: Please consider ordering Novolog 2 units TID with meals for meal  coverage if patient eats at least 50% of meals. Inpatient Endocrinologist Referral: Blood glucose is very liable and has fluctuated from 110-580 mg/dl over the past 24 hours. Please consider inpatient consult for endocrinology for inpatient recommendations on glycemic control.  Thanks, Orlando PennerMarie Jaedyn Lard, RN, MSN, CCRN, CDE Diabetes Coordinator Inpatient Diabetes Program [phone] (Team Pager) [phone] (AP office) [phone] [facility] office)

## 2014-11-23 DIAGNOSIS — T68XXXD Hypothermia, subsequent encounter: Secondary | ICD-10-CM

## 2014-11-23 DIAGNOSIS — E162 Hypoglycemia, unspecified: Secondary | ICD-10-CM

## 2014-11-23 LAB — RENAL FUNCTION PANEL
ANION GAP: 8 (ref 5–15)
Albumin: 3.1 g/dL — ABNORMAL LOW (ref 3.5–5.2)
Albumin: 3.4 g/dL — ABNORMAL LOW (ref 3.5–5.2)
Anion gap: 11 (ref 5–15)
BUN: 22 mg/dL (ref 6–23)
BUN: 24 mg/dL — ABNORMAL HIGH (ref 6–23)
CHLORIDE: 100 mmol/L (ref 96–112)
CO2: 27 mmol/L (ref 19–32)
CO2: 25 mmol/L (ref 19–32)
Calcium: 9.2 mg/dL (ref 8.4–10.5)
Calcium: 9.5 mg/dL (ref 8.4–10.5)
Chloride: 98 mmol/L (ref 96–112)
Creatinine, Ser: 6.26 mg/dL — ABNORMAL HIGH (ref 0.50–1.10)
Creatinine, Ser: 7.04 mg/dL — ABNORMAL HIGH (ref 0.50–1.10)
GFR calc non Af Amer: 7 mL/min — ABNORMAL LOW (ref >90)
GFR, EST AFRICAN AMERICAN: 9 mL/min — AB (ref >90)
GFR, EST AFRICAN AMERICAN: 8 mL/min — AB (ref >90)
GFR, EST NON AFRICAN AMERICAN: 8 mL/min — AB (ref >90)
GLUCOSE: 257 mg/dL — AB (ref 70–99)
Glucose, Bld: 322 mg/dL — ABNORMAL HIGH (ref 70–99)
PHOSPHORUS: 5.7 mg/dL — AB (ref 2.3–4.6)
Phosphorus: 5 mg/dL — ABNORMAL HIGH (ref 2.3–4.6)
Potassium: 3.9 mmol/L (ref 3.5–5.1)
Potassium: 4.2 mmol/L (ref 3.5–5.1)
SODIUM: 134 mmol/L — AB (ref 135–145)
Sodium: 135 mmol/L (ref 135–145)

## 2014-11-23 LAB — GLUCOSE, CAPILLARY
GLUCOSE-CAPILLARY: 186 mg/dL — AB (ref 70–99)
Glucose-Capillary: 342 mg/dL — ABNORMAL HIGH (ref 70–99)

## 2014-11-23 MED ORDER — DOXERCALCIFEROL 4 MCG/2ML IV SOLN
INTRAVENOUS | Status: AC
  Administered 2014-11-23: 3 ug via INTRAVENOUS
  Filled 2014-11-23: qty 2

## 2014-11-23 MED ORDER — SODIUM CHLORIDE 0.9 % IV SOLN: 100.0000 mL | INTRAVENOUS | Status: DC | PRN

## 2014-11-23 MED ORDER — HEPARIN SODIUM (PORCINE) 1000 UNIT/ML DIALYSIS: 1000.0000 [IU] | INTRAMUSCULAR | Status: DC | PRN

## 2014-11-23 MED ORDER — HEPARIN SODIUM (PORCINE) 1000 UNIT/ML DIALYSIS: 3000.0000 [IU] | INTRAMUSCULAR | Status: DC | PRN

## 2014-11-23 MED ORDER — NEPRO/CARBSTEADY PO LIQD
237.0000 mL | ORAL | Status: DC | PRN
  Filled 2014-11-23: qty 237

## 2014-11-23 MED ORDER — ALTEPLASE 2 MG IJ SOLR
2.0000 mg | Freq: Once | INTRAMUSCULAR | Status: DC | PRN
  Filled 2014-11-23: qty 2

## 2014-11-23 MED ORDER — LIDOCAINE HCL (PF) 1 % IJ SOLN: 5.0000 mL | INTRAMUSCULAR | Status: DC | PRN

## 2014-11-23 MED ORDER — PENTAFLUOROPROP-TETRAFLUOROETH EX AERO: 1.0000 "application " | INHALATION_SPRAY | CUTANEOUS | Status: DC | PRN

## 2014-11-23 MED ORDER — LIDOCAINE-PRILOCAINE 2.5-2.5 % EX CREA
1.0000 "application " | TOPICAL_CREAM | CUTANEOUS | Status: DC | PRN
  Filled 2014-11-23: qty 5

## 2014-11-23 MED ORDER — METOCLOPRAMIDE HCL 10 MG PO TABS: 10.0000 mg | ORAL_TABLET | Freq: Three times a day (TID) | ORAL | Status: DC

## 2014-11-23 NOTE — Procedures (Signed)
I have personally attended this patient's dialysis session.   HD today to keep on schedule. Thigh AVG BFR 400 2K bath  Camille Balynthia Asiana Benninger, MD [facility] [phone] Pager 11/23/2014, 12:25 PM

## 2014-11-23 NOTE — Progress Notes (Signed)
[facility] Rounding Note  Subjective:  Leaving today, but has no dialysis scheduled in [place], would like to run today  We are waiting to hear back from [facility] [address] to see if we can arrange transient dialysis for her (we had a spot for her at [facility] which she then rejected saying she could not live with her father)  Objective: Vital signs in last 24 hours: Temp:  [97.6 F (36.4 C)-98.3 F (36.8 C)] 98 F (36.7 C) (02/18 1000) Pulse Rate:  [62-79] 62 (02/18 1000) Resp:  [15-18] 18 (02/18 1000) BP: (125-183)/(66-96) 157/82 mmHg (02/18 1000) SpO2:  [98 %-100 %] 98 % (02/18 1000) Weight:  [58.8 kg (129 lb 10.1 oz)-59.3 kg (130 lb 11.7 oz)] 58.8 kg (129 lb 10.1 oz) (02/17 2201) Weight change: 1.3 kg (2 lb 13.9 oz)  Intake/Output from previous day: 02/17 0701 - 02/18 0700 In: 600 [P.O.:600] Out: 500  Intake/Output this shift: Total I/O In: 240 [P.O.:240] Out: -   EXAM: General appearance: Alert, in no apparent distress Resp: CTA without rales, rhonchi, or wheezes Cardio: RRR without murmur or rub GI: + BS, soft and nontender Extremities: No edema  Access: R thigh AVG with + bruit  Lab Results:  Recent Labs  11/21/14 0500 11/22/14 1227  WBC 4.4 4.1  HGB 10.7* 10.5*  HCT 30.8* 29.6*  PLT 200 180   BMET:  Recent Labs  11/22/14 1227 11/23/14 0624  NA 124* 135  K 4.8 3.9  CL 90* 100  CO2 23 27  GLUCOSE 622* 257*  BUN 33* 22  CREATININE 8.81* 6.26*  CALCIUM 9.0 9.2  ALBUMIN 3.3* 3.1*    Inpatient Medications: . amLODipine  10 mg Oral BID  . aspirin  81 mg Oral Daily  . carvedilol  25 mg Oral q morning - 10a  . darbepoetin (ARANESP) injection - NON-DIALYSIS  60 mcg Subcutaneous Once  . [START ON 11/28/2014] darbepoetin (ARANESP) injection - DIALYSIS  60 mcg Intravenous Q Tue-HD  . docusate sodium  100 mg Oral BID  . doxercalciferol  3 mcg Intravenous Q T,Th,Sa-HD  . heparin  5,000 Units Subcutaneous 3  times per day  . insulin glargine  9 Units Subcutaneous BID  . lanthanum  1,000 mg Oral TID WC  . levothyroxine  100 mcg Oral QAC breakfast  . metoCLOPramide  10 mg Oral TID AC  . multivitamin  1 tablet Oral QHS  . pantoprazole  40 mg Oral Daily  . senna  1 tablet Oral BID   Dialysis Orders: T[facility] 4 hr 180 2K/2.25ca 60kgs 3000u hep 400/1.5  Aranesp 60 q week hectorol 3 Q hd  Assessment/Plan: 1. Recurrent hypoglycemia / DM Type 1 - glucose 186 this AM, very labile, per primary. 2. Hyperkalemia - K 6.6, resolved with extra HD yesterday. HD again today to get back on schedule and because she is leaving without definite arrangements for dialysis right at this moment. 3. ESRD - HD on TTS @ [facility], K 3.9, s/p shuntogram with PTA per IR 2/12. HD pending. 4. HTN/Volume - BP 157/82 on Amlodipine 10 mg qd, Lisinopril 20 bid, Carvedilol 25 mg qd; wt 58.8 kg s/p 500 ml yesterday. 5. Anemia - Hgb 10.5, Aranesp 60 mcg on Tues. 6. Sec HPT - Ca 9.2 (9.9 corrected), P 5; Hectorol 3 mcg, Fosrenol 1 g with meals. 7. Nutrition - Alb 3.1, renal carb-mod diet, vitamin. 8. Dispo - leaving for [place] to live with sister, then  off to [place]   LOS: 7 days   LYLES,CHARLES 11/23/2014,10:48 AM  I have seen and examined this patient and agree with plan as outlined in the above note with highlighted additions. Unstable living situation makes establishing an outpt HD spot for her next to impossible. She rejected her original [facility] for [facility] once we were able to get this arranged, so now we are trying to arrange transient dialysis for her in [facility] on [address]) for 2-3 weeks, after which time she indicates she is going back to [place].  Extra HD today.  Says she is leaving today.  Sayre Mazor B,MD 11/23/2014 12:22 PM

## 2014-11-23 NOTE — Progress Notes (Signed)
Discharge instructions reviewed with pt; allowing time for questions. Pt verbalized understanding. IV removed without issue. Pt informed that [facility]. Pt leaving unit ambulatory with assistance from pt's sister.

## 2014-11-23 NOTE — Progress Notes (Signed)
Inpatient Diabetes Program Recommendations  AACE/ADA: New Consensus Statement on Inpatient Glycemic Control (2013)  Target Ranges:  Prepandial:   less than 140 mg/dL      Peak postprandial:   less than 180 mg/dL (1-2 hours)      Critically ill patients:  140 - 180 mg/dL   Reason for Visit: Results for Susan RossettiMONROE, Kiyla (MRN [id number]) as of 11/23/2014 12:24  Ref. Range 11/22/2014 07:54 11/22/2014 09:08 11/22/2014 10:56 11/22/2014 11:49 11/22/2014 12:27 11/22/2014 16:18 11/22/2014 20:03 11/23/2014 04:05 11/23/2014 06:24 11/23/2014 08:02  Glucose-Capillary Latest Range: 70-99 mg/dL 409580 (HH) 811491 (H) 914563 (HH)   149 (H) 278 (H) 342 (H)  186 (H)    Diabetes history: Type 1 diabetes Outpatient Diabetes medications: Lantus 8 units bid, Novolog 7 units tid with meals, and Novolog correction 1 units for every 50 mg/dL greater than 782150 mg/dL Current orders for Inpatient glycemic control:  Lantus 9 units bid  Called and discussed patient with Resident. He states that the plan is for patient to only be on basal insulin in hospital and at home based on risk of hypoglycemia.  Note that CBG's are very labile.  Due to history of type 1 diabetes, she will likely need rapid acting insulin in smaller doses than home doses.  CBG's seem to be better after dialysis.  Patient states that this is usually the case, however she also states that sometimes her CBG's drop during dialysis.  She states that her CBG's usually drop once a week at home. She states she usually does feel the symptoms of hypoglycemia but sometimes does not.  Discussed the importance of avoidance of low blood sugars in order to regain symptoms of low blood glucoses.   May consider restarting Novolog correction at a much lower dose.  Consider starting Novolog correction at 200 mg/dL- 1 unit for every 50 mg/dL greater than 956200 mg/dL.  May also consider very low dose CHO coverage such as 2 units tid with meals.  Discussed at length with resident.  Will  follow.  Thanks, Beryl MeagerJenny Freddie Dymek, RN, BC-ADM Inpatient Diabetes Coordinator Pager [phone]

## 2014-11-23 NOTE — Progress Notes (Signed)
Pt received HD w/ no complications. 3L fluid removed. Pt alert, no c/o, report called to primary RN

## 2014-12-01 ENCOUNTER — Inpatient Hospital Stay: Payer: Medicare Other | Admitting: Family Medicine

## 2015-01-08 ENCOUNTER — Ambulatory Visit (INDEPENDENT_AMBULATORY_CARE_PROVIDER_SITE_OTHER): Payer: 59 | Admitting: Family Medicine

## 2015-01-08 ENCOUNTER — Encounter: Payer: Self-pay | Admitting: Family Medicine

## 2015-01-08 ENCOUNTER — Other Ambulatory Visit (HOSPITAL_COMMUNITY)
Admission: RE | Admit: 2015-01-08 | Discharge: 2015-01-08 | Disposition: A | Discharge status: Home / Self Care | Payer: 59 | Source: Ambulatory Visit | Attending: Family Medicine | Admitting: Family Medicine

## 2015-01-08 VITALS — BP 144/92 | HR 115 | Temp 98.6°F | Ht 63.0 in | Wt 134.3 lb

## 2015-01-08 DIAGNOSIS — I10 Essential (primary) hypertension: Secondary | ICD-10-CM

## 2015-01-08 DIAGNOSIS — E108 Type 1 diabetes mellitus with unspecified complications: Secondary | ICD-10-CM

## 2015-01-08 DIAGNOSIS — Z1151 Encounter for screening for human papillomavirus (HPV): Secondary | ICD-10-CM | POA: Insufficient documentation

## 2015-01-08 DIAGNOSIS — Z01419 Encounter for gynecological examination (general) (routine) without abnormal findings: Secondary | ICD-10-CM | POA: Diagnosis not present

## 2015-01-08 DIAGNOSIS — Z124 Encounter for screening for malignant neoplasm of cervix: Secondary | ICD-10-CM

## 2015-01-08 NOTE — Progress Notes (Signed)
Subjective: [name] is a 34 y.o. female here for hospital follow up.   She was told to take lantus 9u BID and NO novolog at discharge from the hospital due to concern for her recurrent very high and very low blood sugars. She has not heeded that recommendation and reports CBGs which are elevated (130s-230s in the AM) but no hypoglycemic readings or symptoms. She is taking lantus 10u BID (except only daily on HD days) and novolog 5u TIDAC + SS (1u for every 50mg /dl over 150mg /dl). She attributes the improvement in sugars and also feeling much better overall to being able to get out of her living situation into her own apartment. She is also going to begin working at the [employer] on [address] soon.   She is due for a pap smear.   - ROS: Denies fever, chills, weight loss, dizziness, vision changes, syncope, polyuria, nocturia, paresthesias, polydipsia, chest pain, new wounds.  All other systems reviewed and are negative. - PMFSH: nonsmoker, no EtOH, no illicit drugs. - Medications: reviewed and updated  Objective: BP 144/92 mmHg  Pulse 115  Temp(Src) 98.6 F (37 C) (Oral)  Ht 5\' 3"  (1.6 m)  Wt 134 lb 4.8 oz (60.918 kg)  BMI 23.80 kg/m2  LMP 12/22/2014 (Approximate) Gen: Well-appearing 33 y.o.female in no distress HEENT: Normocephalic,L eye prosthesis, right eye with muddy sclera and reactive pupil, MMM, posterior oropharynx clear, good dentition Neck: Neck supple, no masses or lymphadenopathy; thyroid not enlarged  Pulm: Non-labored; CTAB, no wheezes  CV: Regular rate, no murmur appreciated; no LE edema, no JVD GI: Normoactive BS; soft, non-tender, non-distended, no HSM Skin: No wounds or rashes, no acanthosis nigricans See diabetic foot exam - simple  Neuro: CN II-XII without deficits, sensation intact to light touch, steady gait. Psych: Euthymic mood, broad affect, no SI/HI. Well dressed and groomed.   Assessment & Plan: [name] is a 34 y.o. female here for  hospital follow up.    See problem list for problem-specific plans.

## 2015-01-08 NOTE — Assessment & Plan Note (Addendum)
At goal on recheck today - often controlled with HD. Will make no medication changes at this time.  - Continue coreg, amlodipine, lisinopril

## 2015-01-08 NOTE — Patient Instructions (Signed)
It was good to chat with you I'm so glad everything's going so well!  Keep titrating your insulin as we discussed. We will discuss diabetes at follow up in about 3 months unless anything comes up before then. We will call with the results of your pap smear by the end of the week. If you don't hear from us, contact us as we weren't able to contact you.   Take care! - Dr. Jarvis NewcomerGrunz

## 2015-01-08 NOTE — Assessment & Plan Note (Signed)
Very brittle but recently improved (no hospitalizations for extreme low or high sugars). Will opt to continue on lantus 10u BID (but just daily on HD days) and novolog 5u TIDAC + sliding scale. Recheck Hb A1c at follow up 3 months (at/below goal in January).

## 2015-01-09 LAB — CYTOLOGY - PAP

## 2015-01-10 ENCOUNTER — Telehealth: Payer: Self-pay | Admitting: Family Medicine

## 2015-01-10 ENCOUNTER — Encounter: Payer: Self-pay | Admitting: Family Medicine

## 2015-01-10 NOTE — Telephone Encounter (Signed)
Spoke with patient and informed her of below 

## 2015-01-10 NOTE — Telephone Encounter (Signed)
Pap smear with cotesting: negative.  - Repeat pap smear in 5 years.

## 2015-01-27 NOTE — H&P (Signed)
PATIENT NAME:  [name], [name] MR#:  [id number] DATE OF BIRTH:  02-25-1981  DATE OF ADMISSION:  10/04/2014  REFERRING PHYSICIAN: Dr. Mayford [name], psychiatry.   PRIMARY CARE PHYSICIAN: Dr. Rebbeca [name] in [place].   CHIEF COMPLAINT: Hypoglycemia.   HISTORY OF PRESENT ILLNESS: This very pleasant 34 year old woman who was transferred to [facility] overnight on December 29 has developed hypoglycemia today. She was initially hospitalized at [facility] for approximately 1 week for suicidal ideation/major depression, uncontrolled. Her prolonged hospitalization was due to difficulty findings a behavioral health bed. She arrived at [facility] overnight on December 29 and was admitted on the 30th by Dr. Mayford [name]. She has a history of type 1 diabetes as well as end-stage renal disease on hemodialysis. She had a low blood sugar on the morning of December 30 of 29 which was treated with the hypoglycemia protocol and improved to a high of 396 at noon today. She developed another low this afternoon of 21. She received 10 units of Lantus this morning, 6 units of NovoLog with breakfast, 16 with lunch to cover the high blood sugar of 396, and was hypoglycemic prior to dinner today at 4:30 at 21. She is symptomatic with lethargy, diaphoresis, and some confusion. Hospitalist services are asked to see the patient and we have decided with psychiatry to admit her to the medical floor.   PAST MEDICAL HISTORY: 1.  End-stage renal disease, on hemodialysis for the past 1 year.  2.  Diabetes mellitus type 1 for the past 20 years.  3.  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  4.  Hypertension.  5.  Hypothyroidism.  6.  Diabetic peripheral neuropathy.   PAST SURGICAL HISTORY: She has had multiple eye surgeries.   SOCIAL HISTORY: The patient currently lives with a friend. She denies alcohol, tobacco, or illicit substance abuse. She uses a cane to assist  her ambulation due to blindness. She does not have any children. Please see the note by Dr. Mayford [name] for further details on her social history.  FAMILY MEDICAL HISTORY: Negative for coronary artery disease, stroke, diabetes or kidney disease. No history of colon or breast cancer.   REVIEW OF SYSTEMS: CONSTITUTIONAL: Negative for fevers, chills. Positive for weakness and diaphoresis. No change in weight.  HEENT: No change in vision. Note, that she is mostly blind in the right eye and has a prosthetic left eye. No change in hearing. No pain in the ears. She does have some swelling and tenderness in the left neck. No difficulty swallowing.  RESPIRATORY: No shortness of breath, wheezing, cough, hemoptysis.  CARDIOVASCULAR: No chest pain, palpitations, syncope, edema, orthopnea.   GASTROINTESTINAL: Currently nauseated. No vomiting. No abdominal pain. No diarrhea. No melena, hematochezia, or constipation.  GENITOURINARY: Negative for frequency or dysuria.  MUSCULOSKELETAL: No new joint pain. No joint effusions. No hot, tender or swollen joints. No change in mobility.  NEUROLOGIC: No focal numbness or weakness. She is slightly confused when hypoglycemic, but no history of dementia. No seizures, headaches, history of stroke  PSYCHIATRIC: The patient is depressed. Denies current suicidal ideation, but reports that she is overwhelmed. Please see the note by Dr. Mayford [name] for further psychiatric evaluation.   PHYSICAL EXAMINATION: VITAL SIGNS: Temperature 98.1, pulse 69, respirations 20, blood pressure 158/90, oxygenation 100% on room air.  GENERAL: The patient is lethargic and fatigued-appearing.  HEENT: The right pupil is reactive, cloudy. Extraocular motion intact. Conjunctiva is clear. Oral mucous membranes are pink and  moist with good dentition.  NECK: Supple. There is some fullness and tenderness in the left submandibular area. No erythema or skin break. Thyroid is nontender.  RESPIRATORY: Lungs are  clear to auscultation bilaterally with good air movement.  CARDIOVASCULAR: Regular rate and rhythm. No murmurs, rubs, or gallops. No edema. Peripheral pulses are 2+.  ABDOMEN: Soft, nontender, nondistended. Bowel sounds are normal.  MUSCULOSKELETAL: Range of motion is normal in all joints. No joint effusions. Strength 4/4 throughout.  NEUROLOGIC: Cranial nerves II through XII are grossly intact. Strength and sensation are intact. Tone is normal. She is fatigued, but no focal neurologic deficits.  PSYCHIATRIC: The patient appears depressed. Flat affect. Denying suicidal ideation at this time.   LABORATORY DATA: Lab results are pending. I have ordered a CMET and CBC. Most recent blood sugar is 81.   IMAGING: There has been no imaging performed.   ASSESSMENT AND PLAN: 1.  Hypoglycemia in type 1 diabetic with end-stage renal disease on hemodialysis: The patient has had 2 episodes of blood sugars in the 20s today. I will admit her to the medical floor. We will continue with long acting Lantus at her home dose of 10 units in the morning and 13 in the evening with sliding scale per her home scale. We will consult Dr. Tedd [name] for further recommendations on management of this very brittle diabetic.  2.  Diabetes mellitus type 1: We will check a hemoglobin A1c to see what average blood sugars have been. Maintain a carbohydrate modified diet.  3.  End-stage renal disease, on hemodialysis: We will consult nephrology for continuation of dialysis on Tuesdays, Thursdays, and Saturdays.  4.  Suicidal ideation versus major depression: We will consult psychiatry for continuation of care.  5.  Hypertension: Continue home regimen for now. Add PRN hydralazine. Continue HD. 6.  Hypothyroid: We will check a TSH and continue with levothyroxine.  7.  Prophylaxis: Heparin for deep vein thrombosis prophylaxis. Protonix for gastrointestinal prophylaxis.   TIME SPENT ON ADMISSION: 55 minutes.      ____________________________ Susan Dawleyatherine P. Clent RidgesWalsh, MD cpw:at D: 10/04/2014 17:44:35 ET T: 10/04/2014 18:02:45 ET JOB#: [id number]  cc: Susan Evansatherine P. Clent RidgesWalsh, MD, <Dictator> Gale JourneyATHERINE P Romyn Boswell MD ELECTRONICALLY SIGNED 10/04/2014 22:07
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
## 2015-02-04 NOTE — Discharge Summary (Signed)
PATIENT NAME:  [name], [name] MR#:  [id number] DATE OF BIRTH:  08/05/1981  DATE OF ADMISSION:  10/04/2014 DATE OF DISCHARGE:  10/06/2014  PRIMARY CARE PHYSICIAN: Dr. Rebbeca Paul in [place].   CHIEF COMPLAINT AT THE TIME OF ADMISSION: Hypoglycemia.   ADMITTING DIAGNOSES:  1.  Hypoglycemia in type 1 diabetes with end-stage renal disease, on hemodialysis.  2.  Diabetes mellitus type 1.  3.  End-stage renal disease.  4.  Suicidal ideation versus major depression.  5.  Hypertension.  6.  Hypothyroidism.   DISCHARGE DIAGNOSES:  1.  Hypoglycemia.  2.  End-stage renal disease.  3.  Suicidal ideation.  PROCEDURES: None.   CONSULTATIONS: Nephrology, telepsychiatry.   BRIEF HISTORY AND HOSPITAL COURSE: The patient is a 34 year old female who was transferred from [facility] to [facility] overnight on December 29 as she had developed hypoglycemia. She was initially hospitalized at [facility] for approximately 1 week for suicidal ideation, which was uncontrolled. Her prolonged hospitalization was due to difficulty finding a behavioral health bed. She arrived to [facility] overnight on December 29, and was admitted on the 30th at [facility] by Dr. Mayford Knife. She has history of type 1 diabetes mellitus, as well as end-stage renal disease and gets hemodialysis. The patient's blood sugar went down to 29 on December 30, morning, which was treated with hypoglycemia protocol, which went up to 396 at noon after admitting her to [facility] from [facility]. Subsequently, her blood sugar again dropped down to 21. The patient's hypoglycemia subsequently resolved and, again, on the same day, at the time of dinner at 4:30, her sugar was 21. The patient was admitted to [facility] for multiple hypoglycemic episodes. She was placed on 10 units of long-acting insulin  Lantus in the morning and 13 units in the evening and low-dose sliding scale. Dr. Tedd Sias was consulted for recommendations.   During the hospital course, the patient was noticed to be hypoglycemic with hypoglycemia episodes. It came to her that she is a brittle diabetic. Unfortunately, endocrinology services were not available as this is the holiday season. She was seen by endocrinology in the past and the patient felt like it was not helpful at all. The patient thought the hospital food was making her sugars fluctuate and she felt like she would be better off at home as she would have better control of her diet and she is aware how to adjust her insulin dosing. Also, the patient reported that she is not being depressed and does not have any suicidal ideation. The patient is legally blind. She was seen by inpatient glycemic control RN during the hospital course. The patient was monitored closely and eventually her sugars were more stable. On January 1, she was feeling much better with no hypoglycemia episodes and patient asked to discharge her home.   History of end-stage renal disease. Hemodialysis was continued during the hospital course. The plan is to continue hemodialysis on Tuesday, Thursday, and Saturday. As per my discussion with nephrology, the patient is very compliant with her hemodialysis.   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 Insulin pump was offered. The patient is refusing that. Her Lantus started at 5 units and  changed to 8 units b.i.d. She tolerated that well.   Suicidal ideation versus major depression. After passing the patient from the psychiatric floor, the patient was not followed up by any  psychiatrist on the medical floor as it was the holiday season. As we were worried about suicidal ideation, telepsychiatry consult was placed, and I personally discussed with telepsychiatrist, Dr. Garnetta BuddyFaheem. After talking to the  patient he has recommended to discharge the patient home as the patient is not suicidal. He decided that the patient does not need to be IVC'd, and we have recommended the patient to follow up with her psychiatrist as an outpatient.   Hypertension and hypothyroidism: Continue home medications and levothyroxine.   Pseudohyponatremia from hyperglycemia is resolved. Vital signs are stable. Accu-Cheks were stable at the time of discharge.   CODE STATUS: FULL CODE.   DISCHARGE MEDICATIONS:  1.  Coreg 25 mg p.o. b.i.d.  2.  Lisinopril 20 mg p.o. once daily.  3.  Norvasc 10 mg p.o. once daily.  4.  Synthroid 100 mcg 1 tablet p.o. once daily.  5.  Insulin aspart 3 units subcutaneously 3 times a day.  6.  Lantus 8 units subcutaneously b.i.d.   Home health is arranged with the nurse and nurse aide.   DIET: Carb-controlled renal diet, regular consistency.   FOLLOWUP: With the primary care physician in 1 week, psychiatry, and endocrinology in 1 to 2 weeks. Nephrology in 1 to 2 weeks and continue hemodialysis on Tuesday, Thursday, Saturday. Follow up with outpatient diabetes clinic in a week.   LABORATORY DATA AND IMAGING STUDIES: On January 1, her sugars were persistently ranging from 208 to 240. Glucose 235, BUN 30, creatinine 6.87, sodium 134, potassium 3.9, chloride 96, CO2 of 28, GFR 9. Serum osmolality and calcium were normal. On December 31, WBC normal, hemoglobin 11, hematocrit 33.4, platelets 171,000. Hepatitis B surface antigen: Quantity was not sufficient for analysis.   The plan of care was discussed with the patient. She verbalized understanding of the plan. Her hemoglobin A1c was at 7.6. The patient was discharged home in stable condition.    TOTAL TIME SPENT ON DISCHARGE AND COORDINATION OF CARE: 45 minutes.    ____________________________ Ramonita LabAruna Analys Ryden, MD ag:ts D: 10/14/2014 18:15:54 ET T: 10/15/2014 01:04:33 ET JOB#: [id number]  cc: Ramonita LabAruna Alyzae Hawkey, MD, <Dictator> Dr. Iran OuchGrumz,  [place], [place] Ramonita LabARUNA Cressida Milford MD ELECTRONICALLY SIGNED 10/16/2014 22:14

## 2015-02-26 ENCOUNTER — Other Ambulatory Visit: Payer: Self-pay | Admitting: Family Medicine

## 2015-02-26 NOTE — Telephone Encounter (Signed)
Refill request from pharmacy. Will forward to PCP for review. Mariel Lukins, CMA. 

## 2015-03-02 ENCOUNTER — Ambulatory Visit (INDEPENDENT_AMBULATORY_CARE_PROVIDER_SITE_OTHER): Payer: Medicare Other | Admitting: Family Medicine

## 2015-03-02 ENCOUNTER — Encounter: Payer: Self-pay | Admitting: Family Medicine

## 2015-03-02 VITALS — BP 175/81 | HR 63 | Temp 98.1°F | Ht 63.0 in | Wt 130.9 lb

## 2015-03-02 DIAGNOSIS — E108 Type 1 diabetes mellitus with unspecified complications: Secondary | ICD-10-CM

## 2015-03-02 DIAGNOSIS — N921 Excessive and frequent menstruation with irregular cycle: Secondary | ICD-10-CM | POA: Diagnosis not present

## 2015-03-02 DIAGNOSIS — N920 Excessive and frequent menstruation with regular cycle: Secondary | ICD-10-CM | POA: Insufficient documentation

## 2015-03-02 DIAGNOSIS — R110 Nausea: Secondary | ICD-10-CM | POA: Diagnosis not present

## 2015-03-02 LAB — POCT URINALYSIS DIPSTICK
Bilirubin, UA: NEGATIVE
Glucose, UA: 250
KETONES UA: NEGATIVE
NITRITE UA: NEGATIVE
Protein, UA: >=300
SPEC GRAV UA: 1.02
UROBILINOGEN UA: 0.2
pH, UA: >=9

## 2015-03-02 LAB — POCT URINE PREGNANCY: Preg Test, Ur: NEGATIVE

## 2015-03-02 LAB — POCT GLYCOSYLATED HEMOGLOBIN (HGB A1C): Hemoglobin A1C: 7.8

## 2015-03-02 LAB — POCT UA - MICROSCOPIC ONLY

## 2015-03-02 LAB — POCT HEMOGLOBIN: Hemoglobin: 9.9 g/dL — AB (ref 12.2–16.2)

## 2015-03-02 LAB — GLUCOSE, CAPILLARY: Glucose-Capillary: 210 mg/dL — ABNORMAL HIGH (ref 65–99)

## 2015-03-02 MED ORDER — MEDROXYPROGESTERONE ACETATE 10 MG PO TABS: 10.0000 mg | ORAL_TABLET | Freq: Every day | ORAL | Status: AC

## 2015-03-02 NOTE — Assessment & Plan Note (Signed)
Pt brittle type I diabetic.  Will obtain UA to evaluate for blood/ketones, POC Hgb and POC CBG - If all reasonable, trial of Provera 10 mg appropriate for 5 days-7 days - If no response at that time, further evaluation appropriate with speculum, Vaginal/trans abdominal [place]

## 2015-03-02 NOTE — Assessment & Plan Note (Signed)
Concern for possible gastroparesis  - Check POC CBG, POC Hgb, A1C - Previously has tried Reglan and states "does not want to take medication cause of covering up Sx" - Can consider gastric emptying study vs GI if she does not improve

## 2015-03-02 NOTE — Progress Notes (Signed)
[name] is a 34 y.o. female who presents today for vaginal bleeding/nausea.  Nausea - Ongoing issue now for 5-6 months.  She has had episodes where she becomes severally nauseated, but this is intermittent.  Does not notice changes with meals she states but does get worse after about 30 minutes post-prandial.  No weight loss, fever, chills, sweats, myalgias, recent travel or sick contacts. She has tried homebrewed ginger beer, which has helped.  Has had DM I for about 20 years.  As well, she has tried Reglan in the past but states this has not helped.    Vaginal Bleeding - Ongoing now for about 6-7 days, has had prolonged periods in the past.  Denies foul discharge or odor.  No fever, chills, sweats, weight loss, diffuse abdominal pain.  She has not tried anything for this and denies any lightheadedness, blurred vision, dizziness, fatigue, CP, SOB.    Past Medical History  Diagnosis Date  . 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 Hypothyroidism   . Hypertension   . Type I diabetes mellitus   . DVT (deep venous thrombosis)     "both arms" (11/11/2013)  . High cholesterol   . Diabetic retinopathy associated with type 1 diabetes mellitus   . Heart murmur     "slight" (11/11/2013)  . Graves' disease     "had thyroid radioactively treated" (11/11/2013)  . End-stage renal disease on hemodialysis     "TTS; [facility]" (11/11/2013)   FHx, Allergies, Social Hx reviewed and updated  Current Outpatient Prescriptions on File Prior to Visit  Medication Sig Dispense Refill  . amLODipine (NORVASC) 10 MG tablet Take 1 tablet by mouth at  bedtime 90 tablet 3  . aspirin 81 MG chewable tablet Chew 1 tablet (81 mg total) by mouth daily. 30 tablet 0  . carvedilol (COREG) 25 MG tablet Take 25 mg by mouth every morning.    . gabapentin (NEURONTIN) 300 MG capsule Take 1 capsule (300 mg total) by mouth daily. (Patient not taking: Reported on 10/24/2014) 30 capsule 3  . insulin glargine  (LANTUS) 100 UNIT/ML injection Inject 0.08 mLs (8 Units total) into the skin 2 (two) times daily. 10 mL 11  . lanthanum (FOSRENOL) 1000 MG chewable tablet Chew 1,000 mg by mouth as needed (phosphante).    Marland Kitchen levothyroxine (SYNTHROID, LEVOTHROID) 100 MCG tablet Take 1 tablet by mouth  daily 90 tablet 3  . lisinopril (PRINIVIL,ZESTRIL) 20 MG tablet Take 20 mg by mouth 2 (two) times daily.    . metoCLOPramide (REGLAN) 10 MG tablet Take 1 tablet (10 mg total) by mouth 3 (three) times daily before meals. 30 tablet 0  . multivitamin (RENA-VIT) TABS tablet Take 1 tablet by mouth at bedtime. 30 tablet 1  . pantoprazole (PROTONIX) 40 MG tablet Take 1 tablet (40 mg total) by mouth daily. 30 tablet 0   No current facility-administered medications on file prior to visit.    ROS: Per HPI.  All other systems reviewed and are negative.   Physical Exam Filed Vitals:   03/02/15 1349  BP: 175/81  Pulse: 63  Temp: 98.1 F (36.7 C)    Physical Examination: General appearance - alert, well appearing, and in no distress Mouth - mucous membranes moist, pharynx normal without lesions Chest - clear to auscultation, no wheezes, rales or rhonchi, symmetric air entry Heart - normal rate and regular rhythm Abdomen - soft, nontender, nondistended, no masses or organomegaly  Lab Results  Component Value Date   HGBA1C 6.8* 10/25/2014

## 2015-03-21 ENCOUNTER — Other Ambulatory Visit: Payer: Self-pay | Admitting: Family Medicine

## 2015-04-12 ENCOUNTER — Telehealth: Payer: Self-pay | Admitting: Family Medicine

## 2015-04-12 NOTE — Telephone Encounter (Signed)
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

## 2015-04-13 NOTE — Telephone Encounter (Signed)
Attempted call today. LVM with clinic number for call back. I will try again later. If she calls again, I'd appreciate a page at [phone] and/or the best number to reach her.

## 2015-04-16 NOTE — Telephone Encounter (Signed)
I called the number listed in [employer]IC [phone]) which is [name]'s cell phone. Her best friend [name] answered stating she was sleeping, which she has been doing much more of lately. [name] discontinued dialysis as of last Saturday and is under home hospice care at her Father's house. This is not too surprising as she has expressed this desire in the past. Her symptoms have been worsening markedly over the past 24-48 hours, including nausea with intractable emesis, body pain, somnolence. She has not been able to keep anything down for the past 24 hours. These symptoms have been attentively treated by hospice. A hospice RN was en route at the time of my call. I voiced my desire for [name] to be moved to residential hospice unless her wishes are to stay where she is and [name] expressed that desire as well and says she will discuss this with the RN. I offered to assist them in any way that I am able and will call in a day or two to check in.

## 2015-05-04 IMAGING — CR DG CHEST 1V PORT
1 series · 1 of 1 positions shown · non-contrast
Comparison: 03/30/2013.

CLINICAL DATA: 31-year-old female right IJ hemodialysis catheter
placed.

PORTABLE CHEST - 1 VIEW

[AP]
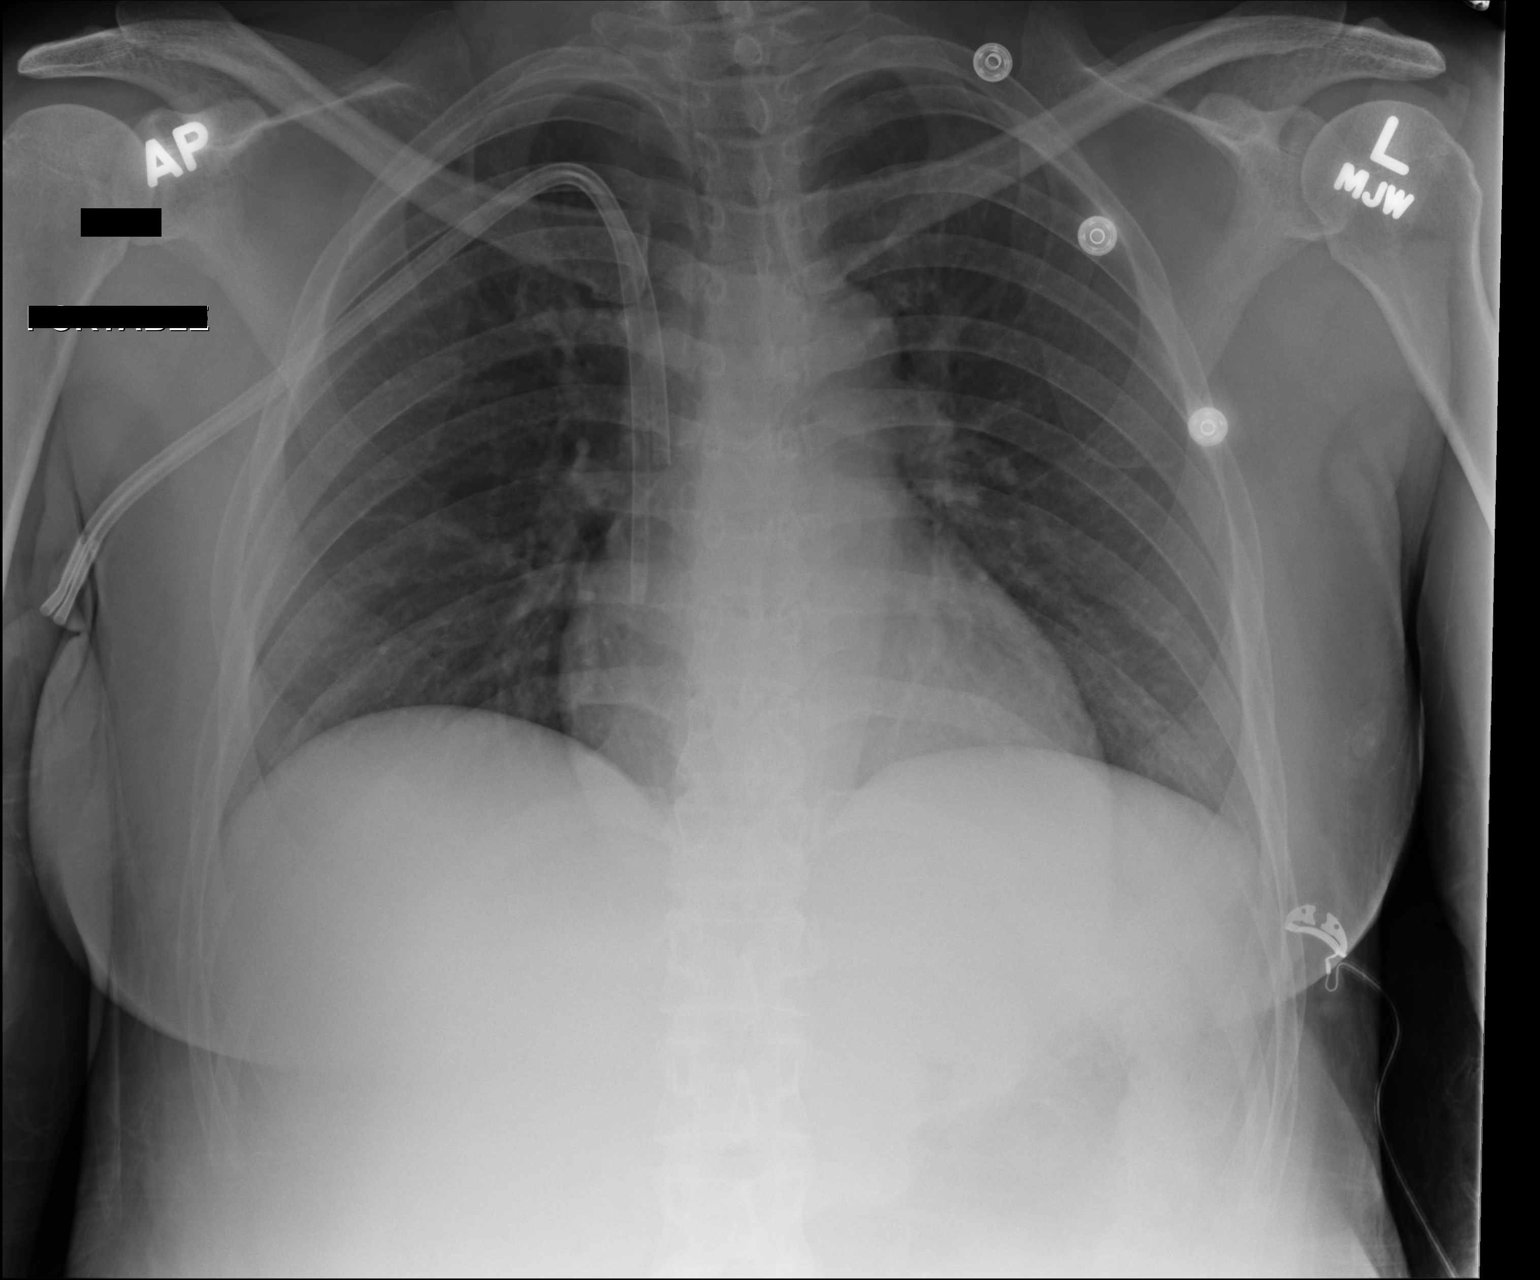

[1 of 1 positions shown; findings below may reference images not displayed]

FINDINGS: Semi upright AP portable view 2000 hours.  Right IJ
approach dual lumen dialysis type catheter in place.  No
pneumothorax.  Catheter tips at the level of the SVC and cavoatrial
junction.  Lower lung volumes.  No pulmonary edema, pleural
effusion or confluent pulmonary opacity.  Stable cardiac size and
mediastinal contours.  Visualized tracheal air column is within
normal limits.
IMPRESSION: 1.  Right IJ dual lumen catheter placed with no pneumothorax or
adverse features.
2.  Lower lung volumes.

## 2015-05-07 DEATH — deceased

## 2015-05-09 IMAGING — DX DG CHEST 1V PORT
1 series · 1 of 1 positions shown · non-contrast
Comparison: 04/01/2013.

CLINICAL DATA: Diaphoresis and fatigue.

PORTABLE CHEST - 1 VIEW

[portable]
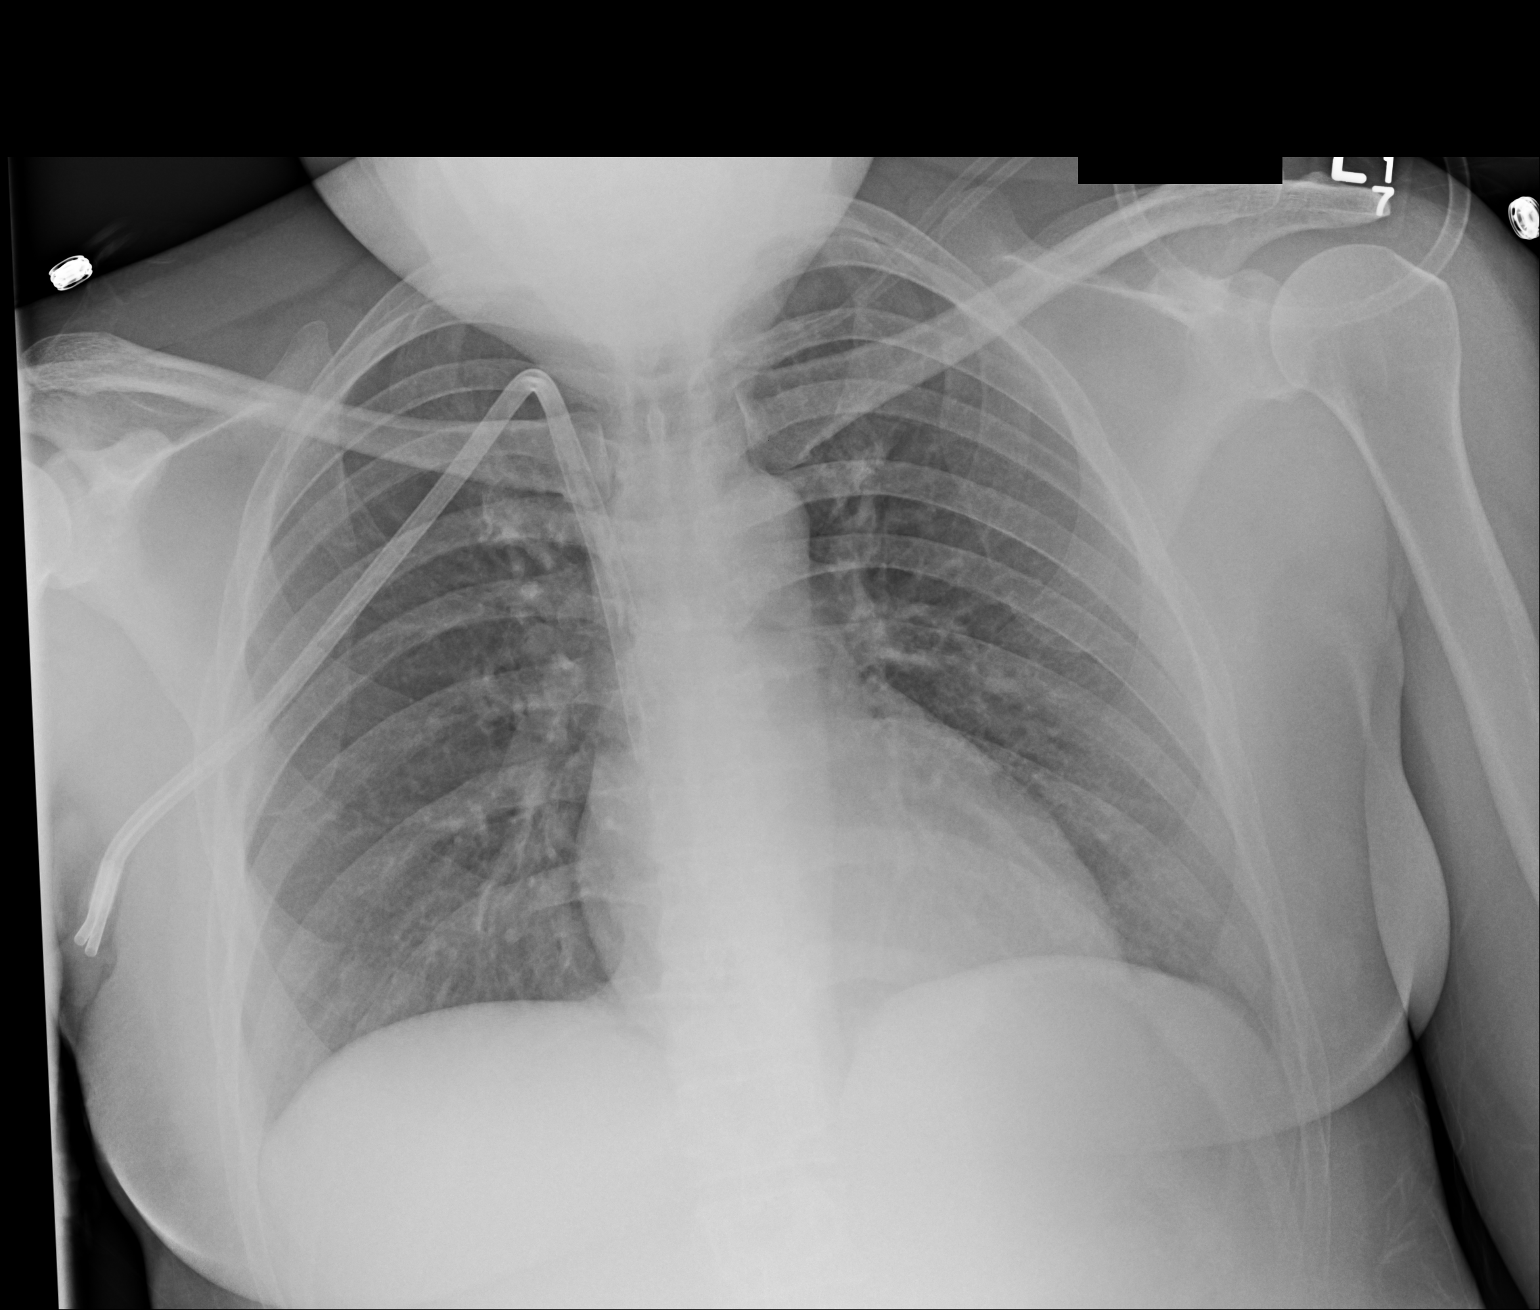

[1 of 1 positions shown; findings below may reference images not displayed]

FINDINGS: The diatek catheter is stable.  The cardiac silhouette,
mediastinal and hilar contours are within normal limits and
unchanged.  Mild central vascular congestion without overt
pulmonary edema.  No pleural effusion.  The bony thorax is intact.
IMPRESSION: Mild vascular congestion but no edema or effusions.

## 2016-11-25 IMAGING — CR DG ABDOMEN ACUTE W/ 1V CHEST
3 series · 3 of 3 positions shown · non-contrast
Comparison: Chest 06/28/2014

CLINICAL DATA: Emesis and abdominal pain

EXAM:
ACUTE ABDOMEN SERIES (ABDOMEN 2 VIEW & CHEST 1 VIEW)

[chest pa]
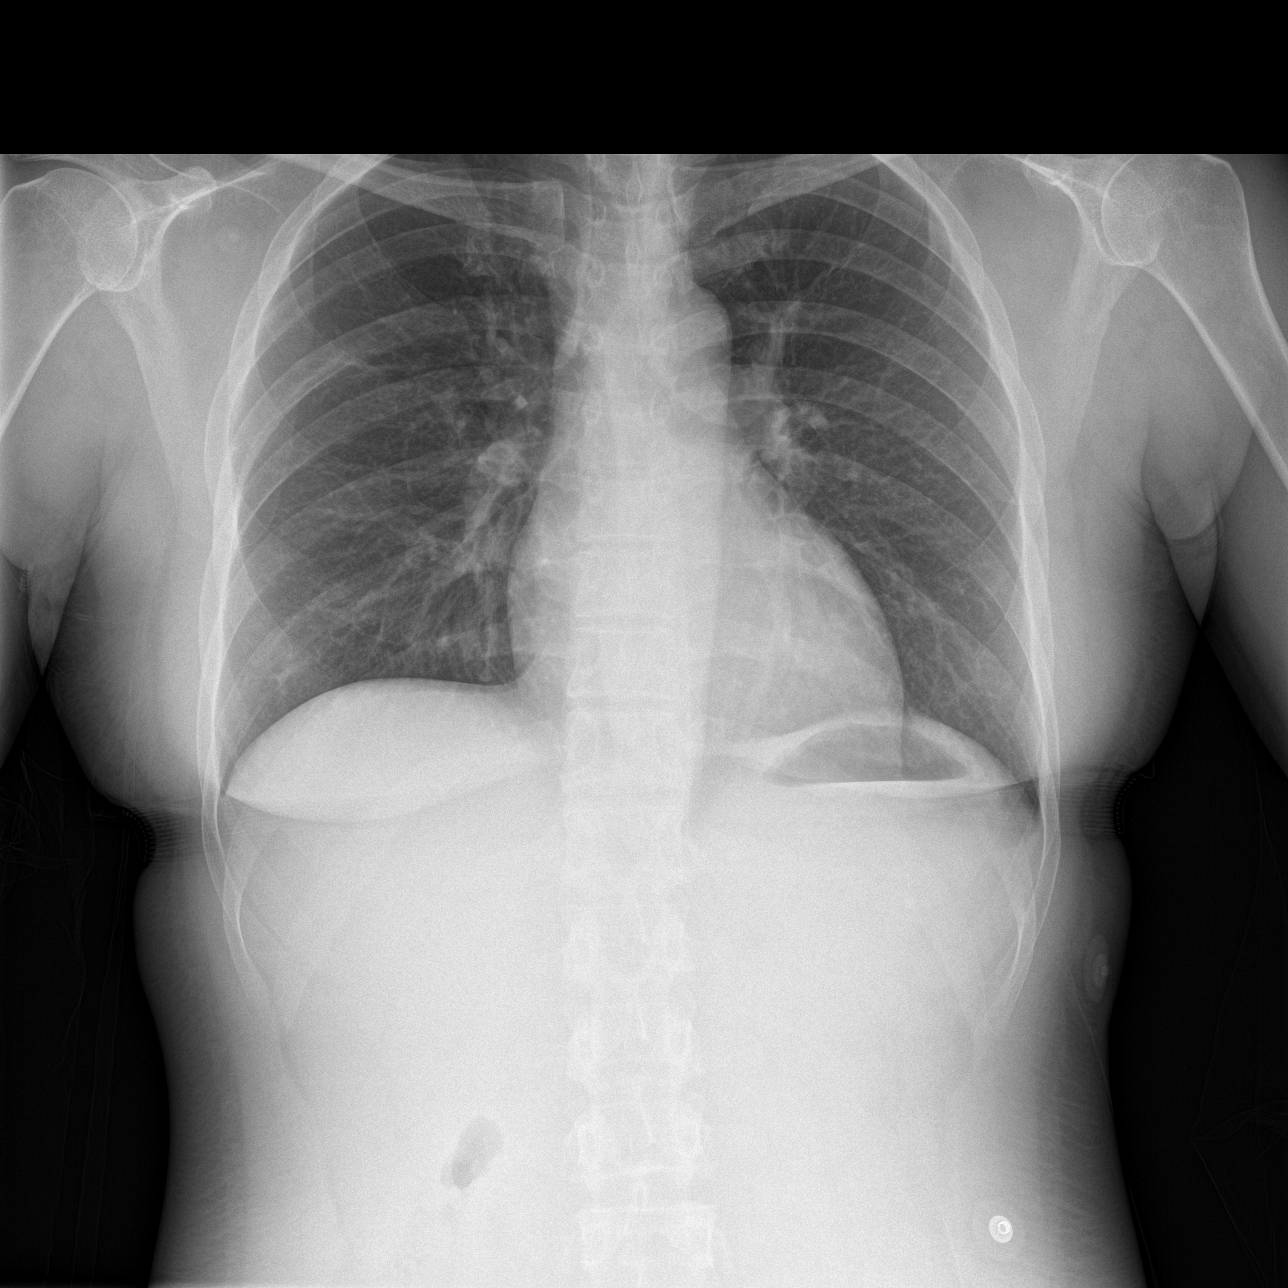

[abdomen erect]
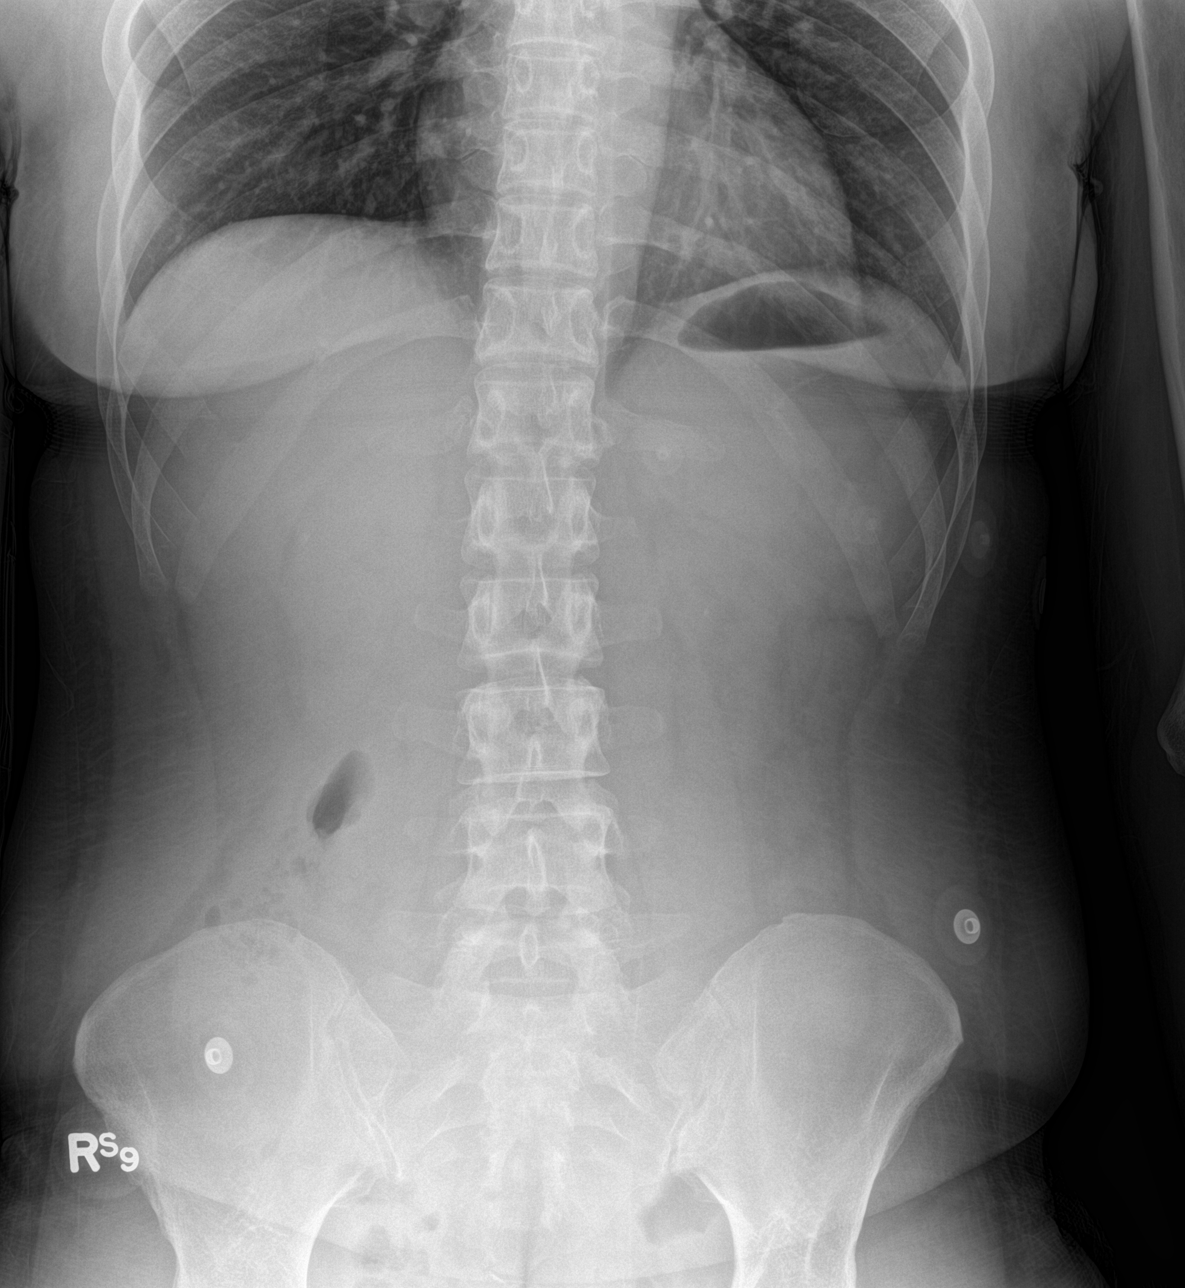

[abdomen supine]
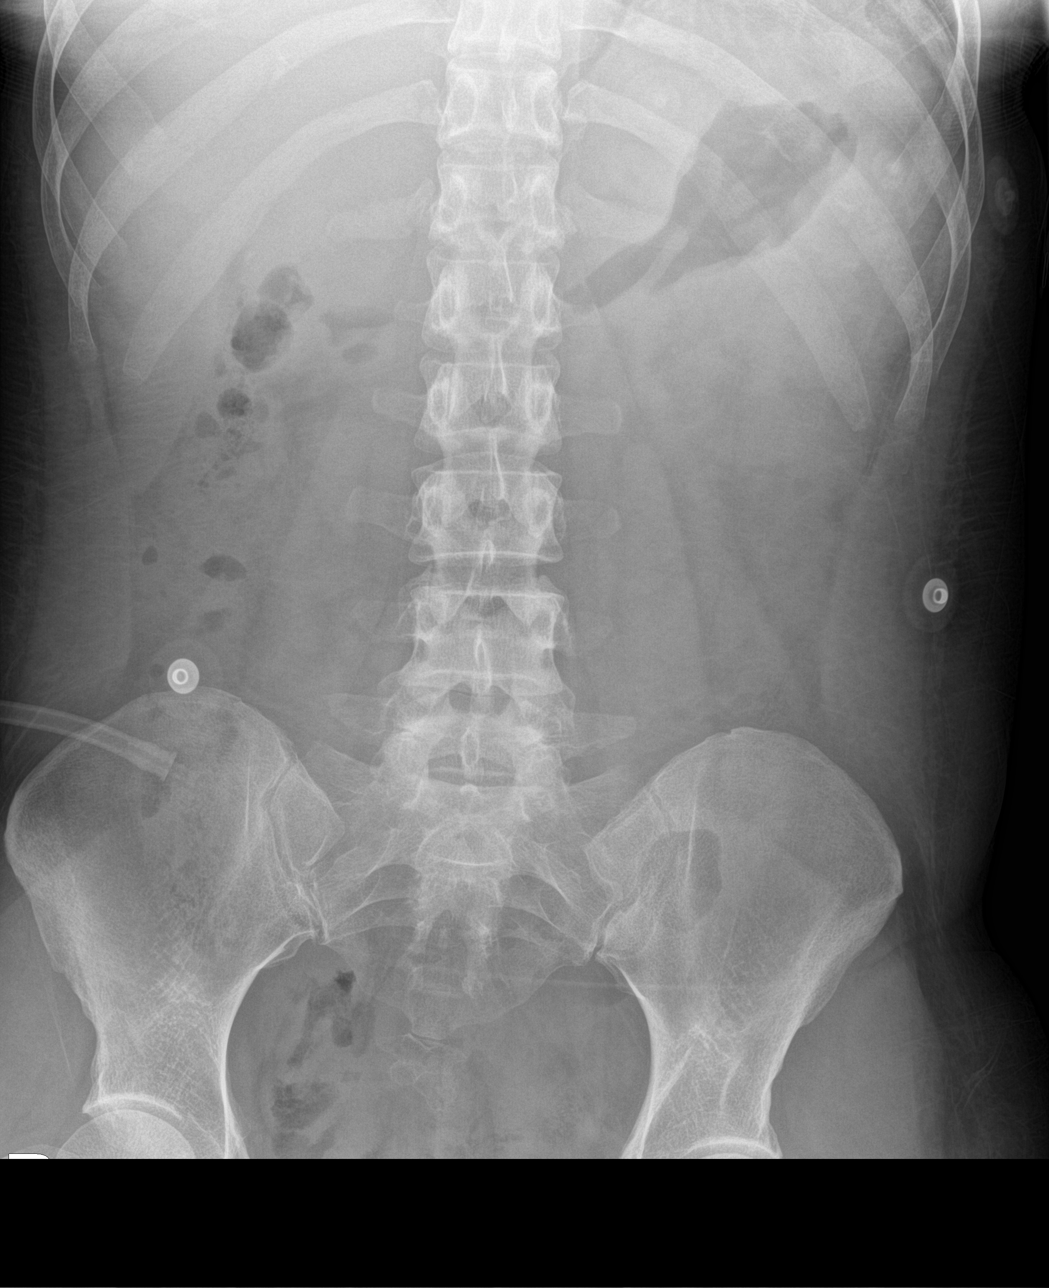

[3 of 3 positions shown; findings below may reference images not displayed]

FINDINGS: HEART SIZE AND VASCULARITY NORMAL.  LUNGS ARE CLEAR.

NORMAL BOWEL GAS PATTERN. NO BOWEL OBSTRUCTION OR FREE AIR. NO
ILEUS. BONY STRUCTURES ARE INTACT. QUESTION 2 MM LEFT RENAL
CALCULUS.
IMPRESSION: NO ACTIVE CARDIOPULMONARY DISEASE

NORMAL BOWEL GAS PATTERN.  POSSIBLE SMALL LEFT RENAL CALCULUS.
